# Patient Record
Sex: Female | Born: 1945 | Race: White | Hispanic: No | Marital: Married | Smoking: Former smoker
Health system: Southern US, Community
[De-identification: ages and names within clinical notes are randomized; demographics above are authoritative.]

## PROBLEM LIST (undated history)

## (undated) DIAGNOSIS — M1990 Unspecified osteoarthritis, unspecified site: Secondary | ICD-10-CM

## (undated) DIAGNOSIS — I10 Essential (primary) hypertension: Secondary | ICD-10-CM

## (undated) DIAGNOSIS — C4490 Unspecified malignant neoplasm of skin, unspecified: Secondary | ICD-10-CM

## (undated) DIAGNOSIS — J45909 Unspecified asthma, uncomplicated: Secondary | ICD-10-CM

## (undated) DIAGNOSIS — R51 Headache: Secondary | ICD-10-CM

## (undated) DIAGNOSIS — J069 Acute upper respiratory infection, unspecified: Secondary | ICD-10-CM

## (undated) DIAGNOSIS — J019 Acute sinusitis, unspecified: Secondary | ICD-10-CM

## (undated) DIAGNOSIS — G4700 Insomnia, unspecified: Secondary | ICD-10-CM

## (undated) DIAGNOSIS — J189 Pneumonia, unspecified organism: Secondary | ICD-10-CM

## (undated) DIAGNOSIS — J029 Acute pharyngitis, unspecified: Secondary | ICD-10-CM

## (undated) HISTORY — PX: BREAST IMPLANT REMOVAL: SUR1101

## (undated) HISTORY — DX: Essential (primary) hypertension: I10

## (undated) HISTORY — DX: Unspecified asthma, uncomplicated: J45.909

## (undated) HISTORY — PX: REDUCTION MAMMAPLASTY: SUR839

## (undated) HISTORY — PX: AUGMENTATION MAMMAPLASTY: SUR837

## (undated) HISTORY — DX: Headache: R51

## (undated) HISTORY — DX: Pneumonia, unspecified organism: J18.9

## (undated) HISTORY — DX: Unspecified malignant neoplasm of skin, unspecified: C44.90

## (undated) HISTORY — DX: Unspecified osteoarthritis, unspecified site: M19.90

## (undated) HISTORY — PX: BREAST ENHANCEMENT SURGERY: SHX7

## (undated) HISTORY — PX: WISDOM TOOTH EXTRACTION: SHX21

---
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
## 1976-10-03 HISTORY — PX: TUBAL LIGATION: SHX77

## 1981-10-03 HISTORY — PX: NASAL SEPTUM SURGERY: SHX37

## 1993-10-03 HISTORY — PX: ABDOMINAL HYSTERECTOMY: SHX81

## 1998-02-24 ENCOUNTER — Other Ambulatory Visit: Admission: RE | Admit: 1998-02-24 | Discharge: 1998-02-24 | Payer: Self-pay | Admitting: Gynecology

## 1998-10-03 HISTORY — PX: SHOULDER SURGERY: SHX246

## 1998-10-03 HISTORY — PX: ROTATOR CUFF REPAIR: SHX139

## 1999-11-08 ENCOUNTER — Encounter: Payer: Self-pay | Admitting: Family Medicine

## 1999-11-08 ENCOUNTER — Ambulatory Visit (HOSPITAL_COMMUNITY): Admission: RE | Admit: 1999-11-08 | Discharge: 1999-11-08 | Payer: Self-pay | Admitting: Family Medicine

## 1999-11-11 ENCOUNTER — Ambulatory Visit (HOSPITAL_BASED_OUTPATIENT_CLINIC_OR_DEPARTMENT_OTHER): Admission: RE | Admit: 1999-11-11 | Discharge: 1999-11-11 | Payer: Self-pay | Admitting: *Deleted

## 2000-09-11 ENCOUNTER — Other Ambulatory Visit: Admission: RE | Admit: 2000-09-11 | Discharge: 2000-09-11 | Payer: Self-pay | Admitting: Obstetrics and Gynecology

## 2000-09-21 ENCOUNTER — Encounter: Payer: Self-pay | Admitting: Obstetrics and Gynecology

## 2000-09-21 ENCOUNTER — Ambulatory Visit (HOSPITAL_COMMUNITY): Admission: RE | Admit: 2000-09-21 | Discharge: 2000-09-21 | Payer: Self-pay | Admitting: Obstetrics and Gynecology

## 2001-09-03 ENCOUNTER — Other Ambulatory Visit: Admission: RE | Admit: 2001-09-03 | Discharge: 2001-09-03 | Payer: Self-pay | Admitting: Obstetrics and Gynecology

## 2001-09-24 ENCOUNTER — Encounter: Payer: Self-pay | Admitting: Obstetrics and Gynecology

## 2001-09-24 ENCOUNTER — Ambulatory Visit (HOSPITAL_COMMUNITY): Admission: RE | Admit: 2001-09-24 | Discharge: 2001-09-24 | Payer: Self-pay | Admitting: Obstetrics and Gynecology

## 2002-10-02 ENCOUNTER — Encounter: Payer: Self-pay | Admitting: Obstetrics and Gynecology

## 2002-10-02 ENCOUNTER — Ambulatory Visit (HOSPITAL_COMMUNITY): Admission: RE | Admit: 2002-10-02 | Discharge: 2002-10-02 | Payer: Self-pay | Admitting: Obstetrics and Gynecology

## 2002-10-24 ENCOUNTER — Other Ambulatory Visit: Admission: RE | Admit: 2002-10-24 | Discharge: 2002-10-24 | Payer: Self-pay | Admitting: Obstetrics and Gynecology

## 2003-10-06 ENCOUNTER — Ambulatory Visit (HOSPITAL_COMMUNITY): Admission: RE | Admit: 2003-10-06 | Discharge: 2003-10-06 | Payer: Self-pay | Admitting: Obstetrics and Gynecology

## 2003-11-17 ENCOUNTER — Other Ambulatory Visit: Admission: RE | Admit: 2003-11-17 | Discharge: 2003-11-17 | Payer: Self-pay | Admitting: Obstetrics and Gynecology

## 2004-05-10 ENCOUNTER — Emergency Department (HOSPITAL_COMMUNITY): Admission: EM | Admit: 2004-05-10 | Discharge: 2004-05-10 | Payer: Self-pay | Admitting: *Deleted

## 2004-09-22 ENCOUNTER — Ambulatory Visit: Payer: Self-pay | Admitting: Family Medicine

## 2004-10-05 ENCOUNTER — Ambulatory Visit: Payer: Self-pay | Admitting: Family Medicine

## 2004-11-09 ENCOUNTER — Ambulatory Visit: Payer: Self-pay | Admitting: Family Medicine

## 2004-12-06 ENCOUNTER — Other Ambulatory Visit: Admission: RE | Admit: 2004-12-06 | Discharge: 2004-12-06 | Payer: Self-pay | Admitting: Obstetrics and Gynecology

## 2004-12-21 ENCOUNTER — Ambulatory Visit: Payer: Self-pay | Admitting: Family Medicine

## 2005-01-07 ENCOUNTER — Ambulatory Visit: Payer: Self-pay | Admitting: Family Medicine

## 2005-07-21 ENCOUNTER — Ambulatory Visit: Payer: Self-pay | Admitting: Family Medicine

## 2005-08-23 ENCOUNTER — Ambulatory Visit: Payer: Self-pay | Admitting: Family Medicine

## 2005-09-06 ENCOUNTER — Ambulatory Visit: Payer: Self-pay | Admitting: Family Medicine

## 2005-10-03 HISTORY — PX: COLONOSCOPY: SHX174

## 2005-10-25 ENCOUNTER — Ambulatory Visit: Payer: Self-pay | Admitting: Family Medicine

## 2005-11-14 ENCOUNTER — Ambulatory Visit: Payer: Self-pay | Admitting: Family Medicine

## 2005-12-14 ENCOUNTER — Ambulatory Visit: Payer: Self-pay | Admitting: Family Medicine

## 2006-01-10 ENCOUNTER — Other Ambulatory Visit: Admission: RE | Admit: 2006-01-10 | Discharge: 2006-01-10 | Payer: Self-pay | Admitting: Obstetrics and Gynecology

## 2006-01-30 ENCOUNTER — Ambulatory Visit: Payer: Self-pay | Admitting: Family Medicine

## 2006-03-01 ENCOUNTER — Encounter: Admission: RE | Admit: 2006-03-01 | Discharge: 2006-03-01 | Payer: Self-pay | Admitting: Family Medicine

## 2006-03-01 ENCOUNTER — Ambulatory Visit: Payer: Self-pay | Admitting: Gastroenterology

## 2006-03-15 ENCOUNTER — Ambulatory Visit: Payer: Self-pay | Admitting: Gastroenterology

## 2006-07-31 ENCOUNTER — Ambulatory Visit: Payer: Self-pay | Admitting: Family Medicine

## 2006-08-08 ENCOUNTER — Ambulatory Visit: Payer: Self-pay | Admitting: Family Medicine

## 2006-08-14 ENCOUNTER — Ambulatory Visit: Payer: Self-pay | Admitting: Family Medicine

## 2006-08-14 LAB — CONVERTED CEMR LAB
CO2: 32 meq/L (ref 19–32)
Chloride: 105 meq/L (ref 96–112)
Creatinine, Ser: 0.9 mg/dL (ref 0.4–1.2)
Sodium: 141 meq/L (ref 135–145)

## 2006-11-14 ENCOUNTER — Ambulatory Visit: Payer: Self-pay | Admitting: Family Medicine

## 2006-12-13 ENCOUNTER — Ambulatory Visit: Payer: Self-pay | Admitting: Family Medicine

## 2006-12-13 LAB — CONVERTED CEMR LAB
BUN: 10 mg/dL (ref 6–23)
CO2: 32 meq/L (ref 19–32)
Calcium: 8.9 mg/dL (ref 8.4–10.5)
Creatinine, Ser: 0.7 mg/dL (ref 0.4–1.2)
Glucose, Bld: 62 mg/dL — ABNORMAL LOW (ref 70–99)
Potassium: 4.4 meq/L (ref 3.5–5.1)

## 2007-01-19 ENCOUNTER — Ambulatory Visit: Payer: Self-pay | Admitting: Internal Medicine

## 2007-02-07 ENCOUNTER — Encounter: Payer: Self-pay | Admitting: Family Medicine

## 2007-02-07 ENCOUNTER — Ambulatory Visit: Payer: Self-pay | Admitting: Family Medicine

## 2007-02-07 DIAGNOSIS — J069 Acute upper respiratory infection, unspecified: Secondary | ICD-10-CM

## 2007-02-07 DIAGNOSIS — I10 Essential (primary) hypertension: Secondary | ICD-10-CM | POA: Insufficient documentation

## 2007-02-07 DIAGNOSIS — R51 Headache: Secondary | ICD-10-CM | POA: Insufficient documentation

## 2007-02-07 DIAGNOSIS — R519 Headache, unspecified: Secondary | ICD-10-CM | POA: Insufficient documentation

## 2007-02-07 HISTORY — DX: Essential (primary) hypertension: I10

## 2007-02-07 HISTORY — DX: Acute upper respiratory infection, unspecified: J06.9

## 2007-02-14 LAB — CONVERTED CEMR LAB: Pap Smear: NORMAL

## 2007-03-15 ENCOUNTER — Ambulatory Visit: Payer: Self-pay | Admitting: Family Medicine

## 2007-05-23 ENCOUNTER — Encounter: Payer: Self-pay | Admitting: Family Medicine

## 2007-09-11 ENCOUNTER — Ambulatory Visit: Payer: Self-pay | Admitting: Family Medicine

## 2008-01-08 ENCOUNTER — Ambulatory Visit: Payer: Self-pay | Admitting: Family Medicine

## 2008-01-08 ENCOUNTER — Telehealth (INDEPENDENT_AMBULATORY_CARE_PROVIDER_SITE_OTHER): Payer: Self-pay | Admitting: *Deleted

## 2008-01-08 DIAGNOSIS — Z85828 Personal history of other malignant neoplasm of skin: Secondary | ICD-10-CM

## 2008-01-08 HISTORY — DX: Personal history of other malignant neoplasm of skin: Z85.828

## 2008-01-08 LAB — CONVERTED CEMR LAB
Bilirubin Urine: NEGATIVE
Ketones, urine, test strip: NEGATIVE
Protein, U semiquant: NEGATIVE
Urobilinogen, UA: NEGATIVE

## 2008-01-14 LAB — CONVERTED CEMR LAB
AST: 27 units/L (ref 0–37)
Alkaline Phosphatase: 55 units/L (ref 39–117)
Basophils Absolute: 0 10*3/uL (ref 0.0–0.1)
Chloride: 105 meq/L (ref 96–112)
Cholesterol: 198 mg/dL (ref 0–200)
Creatinine, Ser: 0.8 mg/dL (ref 0.4–1.2)
Eosinophils Absolute: 0.1 10*3/uL (ref 0.0–0.7)
GFR calc non Af Amer: 78 mL/min
HDL: 75.8 mg/dL (ref >39.0)
MCHC: 32.1 g/dL (ref 30.0–36.0)
MCV: 97 fL (ref 78.0–100.0)
Neutrophils Relative %: 56.9 % (ref 43.0–77.0)
Platelets: 210 10*3/uL (ref 150–400)
Potassium: 4.9 meq/L (ref 3.5–5.1)
RDW: 12.7 % (ref 11.5–14.6)
Sodium: 142 meq/L (ref 135–145)
TSH: 0.78 microintl units/mL (ref 0.35–5.50)
Total Bilirubin: 0.9 mg/dL (ref 0.3–1.2)
VLDL: 14 mg/dL (ref 0–40)

## 2008-02-11 ENCOUNTER — Ambulatory Visit: Payer: Self-pay | Admitting: Family Medicine

## 2008-02-11 ENCOUNTER — Encounter (INDEPENDENT_AMBULATORY_CARE_PROVIDER_SITE_OTHER): Payer: Self-pay | Admitting: *Deleted

## 2008-02-11 LAB — CONVERTED CEMR LAB: OCCULT 3: NEGATIVE

## 2008-02-15 ENCOUNTER — Encounter: Payer: Self-pay | Admitting: Family Medicine

## 2008-03-10 ENCOUNTER — Encounter (INDEPENDENT_AMBULATORY_CARE_PROVIDER_SITE_OTHER): Payer: Self-pay | Admitting: *Deleted

## 2008-05-01 ENCOUNTER — Telehealth: Payer: Self-pay | Admitting: Internal Medicine

## 2008-07-15 ENCOUNTER — Ambulatory Visit: Payer: Self-pay | Admitting: Family Medicine

## 2008-07-15 ENCOUNTER — Encounter (INDEPENDENT_AMBULATORY_CARE_PROVIDER_SITE_OTHER): Payer: Self-pay | Admitting: *Deleted

## 2008-07-15 LAB — CONVERTED CEMR LAB
ALT: 11 units/L (ref 0–35)
Alkaline Phosphatase: 53 units/L (ref 39–117)
Bilirubin, Direct: 0.1 mg/dL (ref 0.0–0.3)
Cholesterol: 182 mg/dL (ref 0–200)
Total Protein: 6.6 g/dL (ref 6.0–8.3)
VLDL: 19 mg/dL (ref 0–40)

## 2009-02-26 ENCOUNTER — Ambulatory Visit: Payer: Self-pay | Admitting: Family Medicine

## 2009-02-26 DIAGNOSIS — J029 Acute pharyngitis, unspecified: Secondary | ICD-10-CM

## 2009-02-26 HISTORY — DX: Acute pharyngitis, unspecified: J02.9

## 2009-03-03 ENCOUNTER — Telehealth (INDEPENDENT_AMBULATORY_CARE_PROVIDER_SITE_OTHER): Payer: Self-pay | Admitting: *Deleted

## 2009-03-17 ENCOUNTER — Ambulatory Visit: Payer: Self-pay | Admitting: Family Medicine

## 2009-03-17 DIAGNOSIS — J019 Acute sinusitis, unspecified: Secondary | ICD-10-CM | POA: Insufficient documentation

## 2009-03-17 HISTORY — DX: Acute sinusitis, unspecified: J01.90

## 2009-05-26 ENCOUNTER — Ambulatory Visit: Payer: Self-pay | Admitting: Family Medicine

## 2009-05-26 LAB — CONVERTED CEMR LAB: Rapid Strep: NEGATIVE

## 2009-05-27 ENCOUNTER — Encounter: Payer: Self-pay | Admitting: Family Medicine

## 2009-06-02 ENCOUNTER — Encounter (INDEPENDENT_AMBULATORY_CARE_PROVIDER_SITE_OTHER): Payer: Self-pay | Admitting: *Deleted

## 2009-07-30 ENCOUNTER — Ambulatory Visit: Payer: Self-pay | Admitting: Family Medicine

## 2009-08-13 ENCOUNTER — Ambulatory Visit: Payer: Self-pay | Admitting: Family Medicine

## 2009-12-22 ENCOUNTER — Ambulatory Visit: Payer: Self-pay | Admitting: Family Medicine

## 2010-10-10 ENCOUNTER — Encounter
Admission: RE | Admit: 2010-10-10 | Discharge: 2010-10-10 | Payer: Self-pay | Source: Home / Self Care | Attending: Sports Medicine | Admitting: Sports Medicine

## 2010-10-14 ENCOUNTER — Ambulatory Visit
Admission: RE | Admit: 2010-10-14 | Discharge: 2010-10-14 | Payer: Self-pay | Source: Home / Self Care | Attending: Family Medicine | Admitting: Family Medicine

## 2010-10-14 ENCOUNTER — Other Ambulatory Visit: Payer: Self-pay | Admitting: Family Medicine

## 2010-10-14 LAB — BASIC METABOLIC PANEL
BUN: 13 mg/dL (ref 6–23)
CO2: 32 mEq/L (ref 19–32)
Calcium: 9.6 mg/dL (ref 8.4–10.5)
Chloride: 100 mEq/L (ref 96–112)
Creatinine, Ser: 0.7 mg/dL (ref 0.4–1.2)
GFR: 87.21 mL/min (ref >60.00)
Glucose, Bld: 66 mg/dL — ABNORMAL LOW (ref 70–99)
Potassium: 4.3 mEq/L (ref 3.5–5.1)
Sodium: 138 mEq/L (ref 135–145)

## 2010-11-03 ENCOUNTER — Encounter: Payer: Self-pay | Admitting: Family Medicine

## 2010-11-04 NOTE — Assessment & Plan Note (Signed)
Summary: sore throat/cough/kdc   Vital Signs:  Patient profile:   65 year old female Height:      64 inches Weight:      146 pounds BMI:     25.15 Temp:     98.2 degrees F oral Pulse rate:   72 / minute Pulse rhythm:   regular BP sitting:   120 / 80  (left arm) Cuff size:   regular  Vitals Entered By: Army Fossa CMA (December 22, 2009 2:19 PM) CC: Pt here for c/o sore throat and terrible cough since friday night. No fever. Took ASA, and gargled with salt water, URI symptoms   History of Present Illness:       This is a 65 year old woman who presents with URI symptoms since Friday.  The patient complains of sore throat, productive cough, and earache, but denies nasal congestion, clear nasal discharge, purulent nasal discharge, dry cough, and sick contacts.  The patient denies fever, low-grade fever (<100.5 degrees), fever of 100.5-103 degrees, fever of 103.1-104 degrees, fever to >104 degrees, stiff neck, dyspnea, wheezing, rash, vomiting, diarrhea, use of an antipyretic, and response to antipyretic.  The patient denies itchy watery eyes, itchy throat, sneezing, seasonal symptoms, response to antihistamine, headache, muscle aches, and severe fatigue.  The patient denies the following risk factors for Strep sinusitis: unilateral facial pain, unilateral nasal discharge, poor response to decongestant, double sickening, tooth pain, Strep exposure, tender adenopathy, and absence of cough.    Current Medications (verified): 1)  Imitrex  Tabs (Sumatriptan Succinate Tabs) .... As Needed 2)  Diovan Hct 80-12.5 Mg Tabs (Valsartan-Hydrochlorothiazide) .Marland Kitchen.. 1 By Mouth Once Daily 3)  Vivelle-Dot 0.05 Mg/24hr  Pttw (Estradiol) .... Once Every Week 4)  One-A-Day Extras Antioxidant  Caps (Multiple Vitamins-Minerals) .... Daily 5)  Nasacort Aq 55 Mcg/act Aers (Triamcinolone Acetonide(Nasal)) .... 2 Sprays Each Nostril Once Daily 6)  Xyzal 5 Mg Tabs (Levocetirizine Dihydrochloride) .Marland Kitchen.. 1 By Mouth Once  Daily 7)  Zithromax Z-Pak 250 Mg Tabs (Azithromycin) .... As Directed 8)  Cheratussin Ac 100-10 Mg/25ml Syrp (Guaifenesin-Codeine) .Marland Kitchen.. 1-2 Tsp By Mouth At Bedtime As Needed  Allergies (verified): No Known Drug Allergies  Past History:  Past medical, surgical, family and social histories (including risk factors) reviewed for relevance to current acute and chronic problems.  Past Medical History: Reviewed history from 02/07/2007 and no changes required. Skin cancer, hx of, basal cell Headache Hypertension  Past Surgical History: Reviewed history from 01/08/2008 and no changes required. Hysterectomy-1995 2000-shoulder surgery 1983-deviated septum 2007-colon negative  Family History: Reviewed history from 01/08/2008 and no changes required. Family History Diabetes 1st degree relative Family History Hypertension Family History of Skin cancer---melanoma B  Social History: Reviewed history from 01/08/2008 and no changes required. Former Smoker-quit 2002 married 2 children Retired---postal service Alcohol use-no Drug use-no Regular exercise-yes  Review of Systems      See HPI  Physical Exam  General:  Well-developed,well-nourished,in no acute distress; alert,appropriate and cooperative throughout examination Ears:  + fluid b/l Nose:  External nasal examination shows no deformity or inflammation. Nasal mucosa are pink and moist without lesions or exudates. Mouth:  pharyngeal erythema and postnasal drip.   Neck:  No deformities, masses, or tenderness noted. Lungs:  R decreased breath sounds and L decreased breath sounds.  improved after neb Heart:  normal rate and no murmur.   Psych:  Cognition and judgment appear intact. Alert and cooperative with normal attention span and concentration. No apparent delusions, illusions, hallucinations  Impression & Recommendations:  Problem # 1:  BRONCHITIS- ACUTE (ICD-466.0)  Take antibiotics and other medications as directed.  Encouraged to push clear liquids, get enough rest, and take acetaminophen as needed. To be seen in 5-7 days if no improvement, sooner if worse.  Her updated medication list for this problem includes:    Zithromax Z-pak 250 Mg Tabs (Azithromycin) .Marland Kitchen... As directed    Cheratussin Ac 100-10 Mg/52ml Syrp (Guaifenesin-codeine) .Marland Kitchen... 1-2 tsp by mouth at bedtime as needed  Orders: Nebulizer Tx (53664)  Complete Medication List: 1)  Imitrex Tabs (Sumatriptan succinate tabs) .... As needed 2)  Diovan Hct 80-12.5 Mg Tabs (Valsartan-hydrochlorothiazide) .Marland Kitchen.. 1 by mouth once daily 3)  Vivelle-dot 0.05 Mg/24hr Pttw (Estradiol) .... Once every week 4)  One-a-day Extras Antioxidant Caps (Multiple vitamins-minerals) .... Daily 5)  Nasacort Aq 55 Mcg/act Aers (Triamcinolone acetonide(nasal)) .... 2 sprays each nostril once daily 6)  Xyzal 5 Mg Tabs (Levocetirizine dihydrochloride) .Marland Kitchen.. 1 by mouth once daily 7)  Zithromax Z-pak 250 Mg Tabs (Azithromycin) .... As directed 8)  Cheratussin Ac 100-10 Mg/68ml Syrp (Guaifenesin-codeine) .Marland Kitchen.. 1-2 tsp by mouth at bedtime as needed Prescriptions: CHERATUSSIN AC 100-10 MG/5ML SYRP (GUAIFENESIN-CODEINE) 1-2 tsp by mouth at bedtime as needed  #6 oz x 0   Entered and Authorized by:   Loreen Freud DO   Signed by:   Loreen Freud DO on 12/22/2009   Method used:   Print then Give to Patient   RxID:   [id number] ZITHROMAX Z-PAK 250 MG TABS (AZITHROMYCIN) as directed  #1 x 0   Entered and Authorized by:   Loreen Freud DO   Signed by:   Loreen Freud DO on 12/22/2009   Method used:   Electronically to        [facility] [phone]* (retail)       [address]       [place], [place]  [address]       Ph: [phone]       Fax: [phone]   RxID:   [id number]    Medication Administration  Medication # 1:    Medication: Albuterol Sulfate Sol 1mg  unit dose  Orders Added: 1)  Est. Patient Level IV [83151] 2)  Nebulizer Tx  [76160]

## 2010-11-04 NOTE — Assessment & Plan Note (Signed)
Summary: DISCUSS DIOVAN/RH.........Marland Kitchen   Vital Signs:  Patient profile:   65 year old female Height:      64 inches Weight:      145.4 pounds BMI:     25.05 Pulse rate:   72 / minute Pulse rhythm:   regular BP sitting:   128 / 76  (left arm) Cuff size:   regular  Vitals Entered By: Almeta Monas CMA Duncan Dull) (October 14, 2010 10:35 AM) CC: wants to discuss Diovan   History of Present Illness:  Hypertension follow-up      This is a 65 year old woman who presents for Hypertension follow-up.  Pt is here to discuss diovan.  The price keeps going up.  Pt states her bp is high in neuro office but its normal / low at home and here.  The patient denies lightheadedness, urinary frequency, headaches, edema, impotence, rash, and fatigue.  The patient denies the following associated symptoms: chest pain, chest pressure, exercise intolerance, dyspnea, palpitations, syncope, leg edema, and pedal edema.  Compliance with medications (by patient report) has been near 100%.  The patient reports that dietary compliance has been good.  The patient reports no exercise.  Adjunctive measures currently used by the patient include salt restriction.    Current Medications (verified): 1)  Imitrex  Tabs (Sumatriptan Succinate Tabs) .... As Needed 2)  Diovan Hct 80-12.5 Mg Tabs (Valsartan-Hydrochlorothiazide) .Marland Kitchen.. 1 By Mouth Once Daily 3)  Vivelle-Dot 0.05 Mg/24hr  Pttw (Estradiol) .... Once Every Week 4)  One-A-Day Extras Antioxidant  Caps (Multiple Vitamins-Minerals) .... Daily  Allergies (verified): No Known Drug Allergies  Physical Exam  General:  Well-developed,well-nourished,in no acute distress; alert,appropriate and cooperative throughout examination Lungs:  Normal respiratory effort, chest expands symmetrically. Lungs are clear to auscultation, no crackles or wheezes. Heart:  normal rate and no murmur.   Extremities:  No clubbing, cyanosis, edema, or deformity noted with normal full range of motion of  all joints.     Impression & Recommendations:  Problem # 1:  HYPERTENSION (ICD-401.9)  Her updated medication list for this problem includes:    Diovan Hct 80-12.5 Mg Tabs (Valsartan-hydrochlorothiazide) .Marland Kitchen... 1 by mouth once daily  Orders: TLB-BMP (Basic Metabolic Panel-BMET) (80048-METABOL) Venipuncture (04540)  BP today: 128/76 Prior BP: 120/80 (12/22/2009)  Labs Reviewed: K+: 4.9 (01/08/2008) Creat: : 0.8 (01/08/2008)   Chol: 182 (07/15/2008)   HDL: 72.0 (07/15/2008)   LDL: 91 (07/15/2008)   TG: 97 (07/15/2008)  Complete Medication List: 1)  Imitrex Tabs (Sumatriptan succinate tabs) .... As needed 2)  Diovan Hct 80-12.5 Mg Tabs (Valsartan-hydrochlorothiazide) .Marland Kitchen.. 1 by mouth once daily 3)  Vivelle-dot 0.05 Mg/24hr Pttw (Estradiol) .... Once every week 4)  One-a-day Extras Antioxidant Caps (Multiple vitamins-minerals) .... Daily  Patient Instructions: 1)  schedule physical Prescriptions: DIOVAN HCT 80-12.5 MG TABS (VALSARTAN-HYDROCHLOROTHIAZIDE) 1 by mouth once daily  #90 Tablet x 3   Entered and Authorized by:   Loreen Freud DO   Signed by:   Loreen Freud DO on 10/14/2010   Method used:   Print then Give to Patient   RxID:   [id number]    Orders Added: 1)  TLB-BMP (Basic Metabolic Panel-BMET) [80048-METABOL] 2)  Venipuncture [08657] 3)  Est. Patient Level III [84696]  Appended Document: DISCUSS DIOVAN/RH.........Marland Kitchen

## 2010-11-24 NOTE — Letter (Signed)
Summary: [facility]   Imported By: Maryln Gottron 11/15/2010 13:03:51  _____________________________________________________________________  External Attachment:    Type:   Image     Comment:   External Document

## 2010-12-02 ENCOUNTER — Encounter: Payer: Self-pay | Admitting: Family Medicine

## 2010-12-08 ENCOUNTER — Encounter: Payer: Self-pay | Admitting: Family Medicine

## 2010-12-21 NOTE — Op Note (Signed)
Summary: Cervical Epidural Steroid Injection/[facility]  Cervical Epidural Steroid Injection/[facility]   Imported By: Maryln Gottron 12/15/2010 13:26:32  _____________________________________________________________________  External Attachment:    Type:   Image     Comment:   External Document

## 2010-12-21 NOTE — Letter (Signed)
Summary: [facility]   Imported By: Maryln Gottron 12/10/2010 11:09:28  _____________________________________________________________________  External Attachment:    Type:   Image     Comment:   External Document

## 2011-01-28 ENCOUNTER — Telehealth: Payer: Self-pay | Admitting: Family Medicine

## 2011-01-28 MED ORDER — VALSARTAN-HYDROCHLOROTHIAZIDE 80-12.5 MG PO TABS: 1.0000 | ORAL_TABLET | Freq: Every day | ORAL | Status: DC

## 2011-01-28 NOTE — Telephone Encounter (Signed)
Spoke with patient and confirmed Rx--left a 4 dollar coupon card at check In for patient and I called pharmacy and changed Rx to a 30 day supply because she will not be able to get a 90 day supply for 4 dollars      KP

## 2011-01-28 NOTE — Telephone Encounter (Signed)
Left message for a call back      KP

## 2011-01-28 NOTE — Telephone Encounter (Signed)
Patient called to report that her blood pressure medicine --Diovan HCT---did not "go generic" before her refill---she got a coupon for her last refill-- she needs another 90 day refill ---1) can she get another coupon or 2) can Dr Laury Axon prescribe another medication until the Diovan goes generic??   She needs answer today--she only has three pills left

## 2011-03-02 ENCOUNTER — Encounter: Payer: Self-pay | Admitting: Family Medicine

## 2011-03-04 ENCOUNTER — Encounter: Payer: Self-pay | Admitting: Family Medicine

## 2011-03-07 ENCOUNTER — Encounter: Payer: Self-pay | Admitting: Family Medicine

## 2011-03-07 ENCOUNTER — Ambulatory Visit (INDEPENDENT_AMBULATORY_CARE_PROVIDER_SITE_OTHER): Payer: BC Managed Care – PPO | Admitting: Family Medicine

## 2011-03-07 VITALS — BP 122/80 | HR 61 | Temp 97.2°F | Ht 64.5 in | Wt 145.2 lb

## 2011-03-07 DIAGNOSIS — Z0000 Encounter for general adult medical examination without abnormal findings: Secondary | ICD-10-CM

## 2011-03-07 LAB — BASIC METABOLIC PANEL
CO2: 29 mEq/L (ref 19–32)
Glucose, Bld: 86 mg/dL (ref 70–99)
Potassium: 4 mEq/L (ref 3.5–5.1)
Sodium: 140 mEq/L (ref 135–145)

## 2011-03-07 LAB — POCT URINALYSIS DIPSTICK
Blood, UA: NEGATIVE
Glucose, UA: NEGATIVE
Nitrite, UA: NEGATIVE
Urobilinogen, UA: 0.2

## 2011-03-07 LAB — CBC WITH DIFFERENTIAL/PLATELET
Basophils Absolute: 0 10*3/uL (ref 0.0–0.1)
Eosinophils Absolute: 0.2 10*3/uL (ref 0.0–0.7)
HCT: 40 % (ref 36.0–46.0)
Hemoglobin: 13.5 g/dL (ref 12.0–15.0)
Lymphs Abs: 1.4 10*3/uL (ref 0.7–4.0)
MCHC: 33.7 g/dL (ref 30.0–36.0)
MCV: 95.9 fl (ref 78.0–100.0)
Monocytes Absolute: 0.4 10*3/uL (ref 0.1–1.0)
Neutro Abs: 3 10*3/uL (ref 1.4–7.7)
RDW: 13.8 % (ref 11.5–14.6)

## 2011-03-07 LAB — HEPATIC FUNCTION PANEL
AST: 21 U/L (ref 0–37)
Albumin: 4 g/dL (ref 3.5–5.2)

## 2011-03-07 LAB — TSH: TSH: 1.21 u[IU]/mL (ref 0.35–5.50)

## 2011-03-07 NOTE — Progress Notes (Signed)
  Subjective:     Laura Hopkins is a 65 y.o. female and is here for a comprehensive physical exam. The patient reports no problems.  History   Social History  . Marital Status: Married    Spouse Name: N/A    Number of Children: 2  . Years of Education: N/A   Occupational History  . postal service     retired   Social History Main Topics  . Smoking status: Former Smoker -- 0.3 packs/day    Quit date: 03/07/1999  . Smokeless tobacco: Former Neurosurgeon    Quit date: 10/03/2000  . Alcohol Use: No  . Drug Use: No  . Sexually Active: Yes -- Female partner(s)   Other Topics Concern  . Not on file   Social History Narrative  . No narrative on file   Health Maintenance  Topic Date Due  . Influenza Vaccine  07/04/2011  . Mammogram  03/06/2012  . Pap Smear  03/06/2013  . Colonoscopy  03/15/2016  . Tetanus/tdap  03/07/2019  . Zostavax  Completed    The following portions of the patient's history were reviewed and updated as appropriate: allergies, current medications, past family history, past medical history, past social history, past surgical history and problem list.  Review of Systems Review of Systems  Constitutional: Negative for activity change, appetite change and fatigue.  HENT: Negative for hearing loss, congestion, tinnitus and ear discharge.  dentist q78m Eyes: Negative for visual disturbance (see optho q1y -- vision corrected to 20/20 with glasses).  Respiratory: Negative for cough, chest tightness and shortness of breath.   Cardiovascular: Negative for chest pain, palpitations and leg swelling.  Gastrointestinal: Negative for abdominal pain, diarrhea, constipation and abdominal distention.  Genitourinary: Negative for urgency, frequency, decreased urine volume and difficulty urinating.  Musculoskeletal: Negative for back pain, arthralgias and gait problem.  Skin: Negative for color change, pallor and rash.  Neurological: Negative for dizziness, light-headedness, numbness and  headaches.  Hematological: Negative for adenopathy. Does not bruise/bleed easily.  Psychiatric/Behavioral: Negative for suicidal ideas, confusion, sleep disturbance, self-injury, dysphoric mood, decreased concentration and agitation.   Derm --Dr Amy [place] Gyn--Dr grewal    Objective:    BP 122/80  Pulse 61  Temp(Src) 97.2 F (36.2 C) (Oral)  Ht 5' 4.5" (1.638 m)  Wt 145 lb 3.2 oz (65.862 kg)  BMI 24.54 kg/m2  SpO2 97% General appearance: alert, cooperative, appears stated age and no distress Head: Normocephalic, without obvious abnormality, atraumatic Eyes: conjunctivae/corneas clear. PERRL, EOM's intact. Fundi benign. Ears: normal TM's and external ear canals both ears Nose: Nares normal. Septum midline. Mucosa normal. No drainage or sinus tenderness. Throat: lips, mucosa, and tongue normal; teeth and gums normal Neck: no adenopathy, no carotid bruit, no JVD, supple, symmetrical, trachea midline and thyroid not enlarged, symmetric, no tenderness/mass/nodules Lungs: clear to auscultation bilaterally Breasts: gyn Heart: regular rate and rhythm, S1, S2 normal, no murmur, click, rub or gallop Abdomen: soft, non-tender; bowel sounds normal; no masses,  no organomegaly Pelvic: gyn Extremities: extremities normal, atraumatic, no cyanosis or edema Pulses: 2+ and symmetric Skin: Skin color, texture, turgor normal. No rashes or lesions Lymph nodes: Cervical, supraclavicular, and axillary nodes normal. Neurologic: Grossly normal    Assessment:    Healthy female exam.   HTN Plan:    cont meds Check fasting labs ghm utd See After Visit Summary for Counseling Recommendations

## 2011-03-07 NOTE — Patient Instructions (Signed)

## 2011-07-23 ENCOUNTER — Other Ambulatory Visit: Payer: Self-pay | Admitting: Family Medicine

## 2011-11-28 ENCOUNTER — Other Ambulatory Visit: Payer: Self-pay | Admitting: Family Medicine

## 2012-07-27 ENCOUNTER — Telehealth: Payer: Self-pay | Admitting: Family Medicine

## 2012-07-27 MED ORDER — VALSARTAN-HYDROCHLOROTHIAZIDE 80-12.5 MG PO TABS: ORAL_TABLET | ORAL | Status: DC

## 2012-07-27 NOTE — Telephone Encounter (Signed)
Refill: Valsartan-hctz 80-12.5 mg tab. Take 1 tablet by mouth daily. Qty 90. Last fill 7.27.13

## 2012-08-08 ENCOUNTER — Ambulatory Visit (INDEPENDENT_AMBULATORY_CARE_PROVIDER_SITE_OTHER): Payer: BC Managed Care – PPO | Admitting: Family Medicine

## 2012-08-08 ENCOUNTER — Encounter: Payer: Self-pay | Admitting: Family Medicine

## 2012-08-08 VITALS — BP 122/78 | HR 54 | Temp 97.8°F | Wt 142.4 lb

## 2012-08-08 DIAGNOSIS — I10 Essential (primary) hypertension: Secondary | ICD-10-CM

## 2012-08-08 LAB — BASIC METABOLIC PANEL
Calcium: 9.3 mg/dL (ref 8.4–10.5)
GFR: 76.78 mL/min (ref >60.00)
Glucose, Bld: 75 mg/dL (ref 70–99)
Sodium: 138 mEq/L (ref 135–145)

## 2012-08-08 LAB — POCT URINALYSIS DIPSTICK
Ketones, UA: NEGATIVE
Protein, UA: NEGATIVE
Spec Grav, UA: <=1.005
pH, UA: 7.5

## 2012-08-08 LAB — HEPATIC FUNCTION PANEL
Albumin: 4 g/dL (ref 3.5–5.2)
Alkaline Phosphatase: 51 U/L (ref 39–117)
Total Bilirubin: 0.7 mg/dL (ref 0.3–1.2)

## 2012-08-08 LAB — LIPID PANEL
Cholesterol: 163 mg/dL (ref 0–200)
HDL: 68.1 mg/dL (ref >39.00)
VLDL: 14.2 mg/dL (ref 0.0–40.0)

## 2012-08-08 MED ORDER — VALSARTAN-HYDROCHLOROTHIAZIDE 80-12.5 MG PO TABS: ORAL_TABLET | ORAL | Status: DC

## 2012-08-08 NOTE — Patient Instructions (Signed)

## 2012-08-08 NOTE — Progress Notes (Signed)
  Subjective:    Patient here for follow-up of elevated blood pressure.  She is exercising and is adherent to a low-salt diet.  Blood pressure is well controlled at home. Cardiac symptoms: none. Patient denies: chest pain, chest pressure/discomfort, claudication, dyspnea, exertional chest pressure/discomfort, fatigue, irregular heart beat, lower extremity edema, near-syncope, orthopnea, palpitations, paroxysmal nocturnal dyspnea, syncope and tachypnea. Cardiovascular risk factors: hypertension. Use of agents associated with hypertension: none. History of target organ damage: none.  The following portions of the patient's history were reviewed and updated as appropriate: allergies, current medications, past family history, past medical history, past social history, past surgical history and problem list.  Review of Systems Pertinent items are noted in HPI.     Objective:    BP 122/78  Pulse 54  Temp 97.8 F (36.6 C) (Oral)  Wt 142 lb 6.4 oz (64.592 kg)  SpO2 97% General appearance: alert, cooperative, appears stated age and no distress Neck: no adenopathy, supple, symmetrical, trachea midline and thyroid not enlarged, symmetric, no tenderness/mass/nodules Lungs: clear to auscultation bilaterally Heart: S1, S2 normal Extremities: extremities normal, atraumatic, no cyanosis or edema    Assessment:    Hypertension, normal blood pressure . Evidence of target organ damage: none.    Plan:    Medication: no change. Dietary sodium restriction. Regular aerobic exercise. Check blood pressures 2-3 times weekly and record. Follow up: 6 months and as needed.

## 2013-08-12 ENCOUNTER — Other Ambulatory Visit: Payer: Self-pay | Admitting: Nurse Practitioner

## 2013-08-12 ENCOUNTER — Ambulatory Visit (INDEPENDENT_AMBULATORY_CARE_PROVIDER_SITE_OTHER): Payer: BC Managed Care – PPO | Admitting: Nurse Practitioner

## 2013-08-12 ENCOUNTER — Other Ambulatory Visit: Payer: BC Managed Care – PPO

## 2013-08-12 VITALS — BP 138/82 | HR 65 | Temp 98.9°F | Wt 148.0 lb

## 2013-08-12 DIAGNOSIS — Z23 Encounter for immunization: Secondary | ICD-10-CM

## 2013-08-12 DIAGNOSIS — J029 Acute pharyngitis, unspecified: Secondary | ICD-10-CM

## 2013-08-12 DIAGNOSIS — J020 Streptococcal pharyngitis: Secondary | ICD-10-CM

## 2013-08-12 LAB — HM MAMMOGRAPHY

## 2013-08-12 LAB — HM PAP SMEAR

## 2013-08-12 NOTE — Progress Notes (Signed)
  Subjective:    Patient ID: Laura Hopkins, female    DOB: 12-04-1945, 67 y.o.   MRN: [id number]  Sore Throat  This is a new problem. The current episode started yesterday. The problem has been gradually worsening. Neither side of throat is experiencing more pain than the other. There has been no fever. The pain is at a severity of 7/10. Associated symptoms include swollen glands. Pertinent negatives include no abdominal pain, congestion, coughing, diarrhea, ear pain, headaches, hoarse voice, plugged ear sensation, shortness of breath, trouble swallowing or vomiting. She has had no exposure to strep. Exposure to: son has URI. She has tried nothing for the symptoms.      Review of Systems  Constitutional: Negative for fever, chills, activity change, appetite change and fatigue.  HENT: Positive for sore throat. Negative for congestion, ear pain, hoarse voice and trouble swallowing.   Eyes: Negative for redness.  Respiratory: Negative for cough and shortness of breath.   Gastrointestinal: Negative for vomiting, abdominal pain and diarrhea.  Skin: Negative for rash.  Neurological: Negative for headaches.  Hematological: Positive for adenopathy.       Objective:   Physical Exam  Vitals reviewed. Constitutional: She is oriented to person, place, and time. She appears well-developed and well-nourished. No distress.  HENT:  Head: Normocephalic and atraumatic.  Right Ear: External ear normal.  Left Ear: External ear normal.  Mouth/Throat: Posterior oropharyngeal erythema present. No oropharyngeal exudate.  Eyes: Conjunctivae are normal. Right eye exhibits no discharge. Left eye exhibits no discharge.  Neck: No tracheal deviation present. No thyromegaly present.  Cardiovascular: Normal rate, regular rhythm and normal heart sounds.   No murmur heard. Pulmonary/Chest: Effort normal and breath sounds normal. No respiratory distress. She has no wheezes.  Lymphadenopathy:    She has no cervical  adenopathy.  Neurological: She is alert and oriented to person, place, and time.  Skin: Skin is warm and dry.  Psychiatric: She has a normal mood and affect. Her behavior is normal. Thought content normal.          Assessment & Plan:  1. Sore throat 1 day, no other symptoms poc strep neg upper Respiratory Culture pending See pt instructions  2. Need for prophylactic vaccination and inoculation against influenza  - Flu Vaccine QUAD 36+ mos IM

## 2013-08-12 NOTE — Patient Instructions (Signed)
Our office will call you regarding your strep culture. In the meantime, use benzocaine throat lozenges for comfort. Sinus rinses if you develop nasal drainage. If this is viral, you will shed virus for up to 5 days. Feel better!

## 2013-08-14 ENCOUNTER — Telehealth: Payer: Self-pay | Admitting: Nurse Practitioner

## 2013-08-14 NOTE — Telephone Encounter (Signed)
Neg throat culture. Sore throat likely viral.

## 2013-08-14 NOTE — Telephone Encounter (Signed)
Patient notified of results.

## 2013-08-15 LAB — CULTURE, UPPER RESPIRATORY: Organism ID, Bacteria: NORMAL

## 2013-08-16 ENCOUNTER — Other Ambulatory Visit: Payer: Self-pay | Admitting: Family Medicine

## 2013-08-26 ENCOUNTER — Encounter: Payer: Self-pay | Admitting: Family Medicine

## 2013-08-26 ENCOUNTER — Ambulatory Visit (INDEPENDENT_AMBULATORY_CARE_PROVIDER_SITE_OTHER): Payer: BC Managed Care – PPO | Admitting: Family Medicine

## 2013-08-26 VITALS — BP 120/74 | HR 61 | Resp 16 | Wt 147.8 lb

## 2013-08-26 DIAGNOSIS — I10 Essential (primary) hypertension: Secondary | ICD-10-CM

## 2013-08-26 LAB — CBC WITH DIFFERENTIAL/PLATELET
Basophils Absolute: 0 10*3/uL (ref 0.0–0.1)
Eosinophils Absolute: 0.1 10*3/uL (ref 0.0–0.7)
HCT: 43.1 % (ref 36.0–46.0)
Hemoglobin: 14.2 g/dL (ref 12.0–15.0)
Lymphs Abs: 1.4 10*3/uL (ref 0.7–4.0)
MCV: 94.6 fl (ref 78.0–100.0)
Monocytes Absolute: 0.6 10*3/uL (ref 0.1–1.0)
Monocytes Relative: 5.4 % (ref 3.0–12.0)
Platelets: 258 10*3/uL (ref 150.0–400.0)
RDW: 13.5 % (ref 11.5–14.6)
WBC: 10.6 10*3/uL — ABNORMAL HIGH (ref 4.5–10.5)

## 2013-08-26 LAB — LIPID PANEL
Cholesterol: 176 mg/dL (ref 0–200)
HDL: 66 mg/dL (ref >39.00)
LDL Cholesterol: 96 mg/dL (ref 0–99)
Triglycerides: 70 mg/dL (ref 0.0–149.0)
VLDL: 14 mg/dL (ref 0.0–40.0)

## 2013-08-26 LAB — BASIC METABOLIC PANEL
BUN: 15 mg/dL (ref 6–23)
GFR: 95.55 mL/min (ref >60.00)
Glucose, Bld: 72 mg/dL (ref 70–99)
Potassium: 4.1 mEq/L (ref 3.5–5.1)

## 2013-08-26 LAB — POCT URINALYSIS DIPSTICK
Blood, UA: NEGATIVE
Nitrite, UA: NEGATIVE
Protein, UA: NEGATIVE
Spec Grav, UA: <=1.005
Urobilinogen, UA: 0.2
pH, UA: 6

## 2013-08-26 LAB — HEPATIC FUNCTION PANEL: Total Bilirubin: 0.4 mg/dL (ref 0.3–1.2)

## 2013-08-26 MED ORDER — VALSARTAN-HYDROCHLOROTHIAZIDE 80-12.5 MG PO TABS: 1.0000 | ORAL_TABLET | Freq: Every day | ORAL | Status: DC

## 2013-08-26 NOTE — Patient Instructions (Signed)

## 2013-08-26 NOTE — Progress Notes (Signed)
Pre visit review using our clinic review tool, if applicable. No additional management support is needed unless otherwise documented below in the visit note. 

## 2013-08-26 NOTE — Progress Notes (Signed)
  Subjective:    Patient here for follow-up of elevated blood pressure.  She is not exercising and is adherent to a low-salt diet.  Blood pressure is well controlled at home. Cardiac symptoms: none. Patient denies: chest pain, chest pressure/discomfort, claudication, dyspnea, exertional chest pressure/discomfort, fatigue, irregular heart beat, lower extremity edema, near-syncope, orthopnea, palpitations, paroxysmal nocturnal dyspnea, syncope and tachypnea. Cardiovascular risk factors: hypertension. Use of agents associated with hypertension: none. History of target organ damage: none.  The following portions of the patient's history were reviewed and updated as appropriate: allergies, current medications, past family history, past medical history, past social history, past surgical history and problem list.  Review of Systems Pertinent items are noted in HPI.     Objective:    BP 120/74  Pulse 61  Resp 16  Wt 147 lb 12.8 oz (67.042 kg)  SpO2 97% General appearance: alert, cooperative, appears stated age and no distress Throat: lips, mucosa, and tongue normal; teeth and gums normal Neck: no adenopathy, supple, symmetrical, trachea midline and thyroid not enlarged, symmetric, no tenderness/mass/nodules Lungs: clear to auscultation bilaterally Heart: S1, S2 normal Extremities: extremities normal, atraumatic, no cyanosis or edema    Assessment:    Hypertension, normal blood pressure . Evidence of target organ damage: none.    Plan:    Medication: no change. Dietary sodium restriction. Regular aerobic exercise. Check blood pressures 2-3 times weekly and record. Follow up: 6 months and as needed.

## 2014-03-27 ENCOUNTER — Encounter: Payer: Self-pay | Admitting: Family Medicine

## 2014-03-27 ENCOUNTER — Ambulatory Visit (INDEPENDENT_AMBULATORY_CARE_PROVIDER_SITE_OTHER): Payer: Medicare HMO | Admitting: Family Medicine

## 2014-03-27 VITALS — BP 120/72 | HR 60 | Temp 97.6°F | Ht 64.0 in | Wt 153.4 lb

## 2014-03-27 DIAGNOSIS — E785 Hyperlipidemia, unspecified: Secondary | ICD-10-CM

## 2014-03-27 DIAGNOSIS — Z23 Encounter for immunization: Secondary | ICD-10-CM

## 2014-03-27 DIAGNOSIS — I10 Essential (primary) hypertension: Secondary | ICD-10-CM

## 2014-03-27 LAB — CBC WITH DIFFERENTIAL/PLATELET
BASOS ABS: 0 10*3/uL (ref 0.0–0.1)
Basophils Relative: 0.3 % (ref 0.0–3.0)
EOS ABS: 0.1 10*3/uL (ref 0.0–0.7)
Eosinophils Relative: 1.2 % (ref 0.0–5.0)
HEMATOCRIT: 37.3 % (ref 36.0–46.0)
Hemoglobin: 12.5 g/dL (ref 12.0–15.0)
LYMPHS ABS: 2.1 10*3/uL (ref 0.7–4.0)
LYMPHS PCT: 28.3 % (ref 12.0–46.0)
MCHC: 33.6 g/dL (ref 30.0–36.0)
MCV: 92.8 fl (ref 78.0–100.0)
Monocytes Absolute: 0.5 10*3/uL (ref 0.1–1.0)
Monocytes Relative: 6.3 % (ref 3.0–12.0)
NEUTROS ABS: 4.7 10*3/uL (ref 1.4–7.7)
Neutrophils Relative %: 63.9 % (ref 43.0–77.0)
Platelets: 233 10*3/uL (ref 150.0–400.0)
RBC: 4.02 Mil/uL (ref 3.87–5.11)
RDW: 13.7 % (ref 11.5–15.5)
WBC: 7.3 10*3/uL (ref 4.0–10.5)

## 2014-03-27 LAB — LIPID PANEL
CHOLESTEROL: 172 mg/dL (ref 0–200)
HDL: 59.3 mg/dL (ref >39.00)
LDL Cholesterol: 89 mg/dL (ref 0–99)
NonHDL: 112.7
TRIGLYCERIDES: 117 mg/dL (ref 0.0–149.0)
Total CHOL/HDL Ratio: 3
VLDL: 23.4 mg/dL (ref 0.0–40.0)

## 2014-03-27 LAB — BASIC METABOLIC PANEL
BUN: 12 mg/dL (ref 6–23)
CALCIUM: 9.4 mg/dL (ref 8.4–10.5)
CO2: 30 meq/L (ref 19–32)
Chloride: 100 mEq/L (ref 96–112)
Creatinine, Ser: 0.8 mg/dL (ref 0.4–1.2)
GFR: 73.21 mL/min (ref >60.00)
GLUCOSE: 80 mg/dL (ref 70–99)
Potassium: 3.4 mEq/L — ABNORMAL LOW (ref 3.5–5.1)
Sodium: 136 mEq/L (ref 135–145)

## 2014-03-27 LAB — HEPATIC FUNCTION PANEL
ALT: 7 U/L (ref 0–35)
AST: 19 U/L (ref 0–37)
Albumin: 4.1 g/dL (ref 3.5–5.2)
Alkaline Phosphatase: 64 U/L (ref 39–117)
Bilirubin, Direct: 0 mg/dL (ref 0.0–0.3)
Total Bilirubin: 0.6 mg/dL (ref 0.2–1.2)
Total Protein: 6.9 g/dL (ref 6.0–8.3)

## 2014-03-27 MED ORDER — VALSARTAN-HYDROCHLOROTHIAZIDE 80-12.5 MG PO TABS: 1.0000 | ORAL_TABLET | Freq: Every day | ORAL | Status: DC

## 2014-03-27 NOTE — Progress Notes (Signed)
  Subjective:    Patient here for follow-up of elevated blood pressure.  She is not exercising and is adherent to a low-salt diet.  Blood pressure is well controlled at home. Cardiac symptoms: none. Patient denies: chest pain, chest pressure/discomfort, claudication, dyspnea, exertional chest pressure/discomfort, fatigue, irregular heart beat, lower extremity edema, near-syncope, orthopnea, palpitations, paroxysmal nocturnal dyspnea, syncope and tachypnea. Cardiovascular risk factors: advanced age (older than 8 for men, 13 for women), hypertension and sedentary lifestyle. Use of agents associated with hypertension: none. History of target organ damage: none.  The following portions of the patient's history were reviewed and updated as appropriate: allergies, current medications, past family history, past medical history, past social history, past surgical history and problem list.  Review of Systems Pertinent items are noted in HPI.     Objective:    BP 120/72  Pulse 60  Temp(Src) 97.6 F (36.4 C) (Oral)  Ht 5\' 4"  (1.626 m)  Wt 153 lb 6.4 oz (69.582 kg)  BMI 26.32 kg/m2  SpO2 98% Throat: lips, mucosa, and tongue normal; teeth and gums normal Neck: no adenopathy, supple, symmetrical, trachea midline and thyroid not enlarged, symmetric, no tenderness/mass/nodules Lungs: clear to auscultation bilaterally Heart: S1, S2 normal Extremities: extremities normal, atraumatic, no cyanosis or edema    Assessment:    Hypertension, normal blood pressure . Evidence of target organ damage: none.    Plan:    Medication: no change. Dietary sodium restriction. Regular aerobic exercise. Check blood pressures 2-3 times weekly and record. Follow up: 6 months and as needed.   1. Essential hypertension  - valsartan-hydrochlorothiazide (DIOVAN-HCT) 80-12.5 MG per tablet; Take 1 tablet by mouth daily.  Dispense: 90 tablet; Refill: 3 - Basic metabolic panel - CBC with Differential  2. Other and  unspecified hyperlipidemia Check labs - Hepatic function panel - Lipid panel

## 2014-03-27 NOTE — Addendum Note (Signed)
Addended by: Ewing Schlein on: 03/27/2014 02:09 PM   Modules accepted: Orders

## 2014-03-27 NOTE — Progress Notes (Signed)
Pre visit review using our clinic review tool, if applicable. No additional management support is needed unless otherwise documented below in the visit note. 

## 2014-03-27 NOTE — Patient Instructions (Signed)
Hypertension Hypertension, commonly called high blood pressure, is when the force of blood pumping through your arteries is too strong. Your arteries are the blood vessels that carry blood from your heart throughout your body. A blood pressure reading consists of a higher number over a lower number, such as 110/72. The higher number (systolic) is the pressure inside your arteries when your heart pumps. The lower number (diastolic) is the pressure inside your arteries when your heart relaxes. Ideally you want your blood pressure below 120/80. Hypertension forces your heart to work harder to pump blood. Your arteries may become narrow or stiff. Having hypertension puts you at risk for heart disease, stroke, and other problems.  RISK FACTORS Some risk factors for high blood pressure are controllable. Others are not.  Risk factors you cannot control include:   Race. You may be at higher risk if you are African American.  Age. Risk increases with age.  Gender. Men are at higher risk than women before age 45 years. After age 65, women are at higher risk than men. Risk factors you can control include:  Not getting enough exercise or physical activity.  Being overweight.  Getting too much fat, sugar, calories, or salt in your diet.  Drinking too much alcohol. SIGNS AND SYMPTOMS Hypertension does not usually cause signs or symptoms. Extremely high blood pressure (hypertensive crisis) may cause headache, anxiety, shortness of breath, and nosebleed. DIAGNOSIS  To check if you have hypertension, your health care Laura Hopkins will measure your blood pressure while you are seated, with your arm held at the level of your heart. It should be measured at least twice using the same arm. Certain conditions can cause a difference in blood pressure between your right and left arms. A blood pressure reading that is higher than normal on one occasion does not mean that you need treatment. If one blood pressure reading  is high, ask your health care Laura Hopkins about having it checked again. TREATMENT  Treating high blood pressure includes making lifestyle changes and possibly taking medication. Living a healthy lifestyle can help lower high blood pressure. You may need to change some of your habits. Lifestyle changes may include:  Following the DASH diet. This diet is high in fruits, vegetables, and whole grains. It is low in salt, red meat, and added sugars.  Getting at least 2 1/2 hours of brisk physical activity every week.  Losing weight if necessary.  Not smoking.  Limiting alcoholic beverages.  Learning ways to reduce stress. If lifestyle changes are not enough to get your blood pressure under control, your health care Laura Hopkins may prescribe medicine. You may need to take more than one. Work closely with your health care Laura Hopkins to understand the risks and benefits. HOME CARE INSTRUCTIONS  Have your blood pressure rechecked as directed by your health care Laura Hopkins.   Only take medicine as directed by your health care Laura Hopkins. Follow the directions carefully. Blood pressure medicines must be taken as prescribed. The medicine does not work as well when you skip doses. Skipping doses also puts you at risk for problems.   Do not smoke.   Monitor your blood pressure at home as directed by your health care Laura Hopkins. SEEK MEDICAL CARE IF:   You think you are having a reaction to medicines taken.  You have recurrent headaches or feel dizzy.  You have swelling in your ankles.  You have trouble with your vision. SEEK IMMEDIATE MEDICAL CARE IF:  You develop a severe headache or   confusion.  You have unusual weakness, numbness, or feel faint.  You have severe chest or abdominal pain.  You vomit repeatedly.  You have trouble breathing. MAKE SURE YOU:   Understand these instructions.  Will watch your condition.  Will get help right away if you are not doing well or get  worse. Document Released: 09/19/2005 Document Revised: 09/24/2013 Document Reviewed: 07/12/2013 Exit[employer] Patient Information 2015 [employer]. This information is not intended to replace advice given to you by your health care Laura Hopkins. Make sure you discuss any questions you have with your health care Laura Hopkins.  

## 2014-03-28 ENCOUNTER — Telehealth: Payer: Self-pay | Admitting: Family Medicine

## 2014-03-28 NOTE — Telephone Encounter (Signed)
Relevant patient education mailed to patient.  

## 2014-08-01 ENCOUNTER — Encounter: Payer: Self-pay | Admitting: Family Medicine

## 2014-10-08 ENCOUNTER — Ambulatory Visit: Payer: Medicare HMO | Admitting: Family Medicine

## 2014-10-09 ENCOUNTER — Ambulatory Visit: Payer: Medicare HMO | Admitting: Family Medicine

## 2014-10-10 ENCOUNTER — Ambulatory Visit: Payer: Medicare HMO | Admitting: Family Medicine

## 2014-10-20 ENCOUNTER — Encounter: Payer: Self-pay | Admitting: Family Medicine

## 2014-10-20 ENCOUNTER — Ambulatory Visit (INDEPENDENT_AMBULATORY_CARE_PROVIDER_SITE_OTHER): Payer: Medicare HMO | Admitting: Family Medicine

## 2014-10-20 VITALS — BP 116/78 | HR 64 | Temp 98.3°F | Wt 151.4 lb

## 2014-10-20 DIAGNOSIS — Z23 Encounter for immunization: Secondary | ICD-10-CM

## 2014-10-20 DIAGNOSIS — I10 Essential (primary) hypertension: Secondary | ICD-10-CM

## 2014-10-20 LAB — HEPATIC FUNCTION PANEL
ALBUMIN: 4 g/dL (ref 3.5–5.2)
ALT: 5 U/L (ref 0–35)
AST: 18 U/L (ref 0–37)
Alkaline Phosphatase: 66 U/L (ref 39–117)
Bilirubin, Direct: 0.1 mg/dL (ref 0.0–0.3)
Total Bilirubin: 0.6 mg/dL (ref 0.2–1.2)
Total Protein: 6.7 g/dL (ref 6.0–8.3)

## 2014-10-20 LAB — BASIC METABOLIC PANEL
BUN: 15 mg/dL (ref 6–23)
CALCIUM: 9.7 mg/dL (ref 8.4–10.5)
CO2: 30 mEq/L (ref 19–32)
Chloride: 103 mEq/L (ref 96–112)
Creatinine, Ser: 0.8 mg/dL (ref 0.40–1.20)
GFR: 76.25 mL/min (ref >60.00)
Glucose, Bld: 85 mg/dL (ref 70–99)
Potassium: 3.9 mEq/L (ref 3.5–5.1)
Sodium: 137 mEq/L (ref 135–145)

## 2014-10-20 LAB — LIPID PANEL
Cholesterol: 167 mg/dL (ref 0–200)
HDL: 61 mg/dL (ref >39.00)
LDL Cholesterol: 84 mg/dL (ref 0–99)
NonHDL: 106
TRIGLYCERIDES: 110 mg/dL (ref 0.0–149.0)
Total CHOL/HDL Ratio: 3
VLDL: 22 mg/dL (ref 0.0–40.0)

## 2014-10-20 MED ORDER — ZOSTER VACCINE LIVE 19400 UNT/0.65ML ~~LOC~~ SOLR: 0.6500 mL | Freq: Once | SUBCUTANEOUS | Status: DC

## 2014-10-20 MED ORDER — VALSARTAN-HYDROCHLOROTHIAZIDE 80-12.5 MG PO TABS: 1.0000 | ORAL_TABLET | Freq: Every day | ORAL | Status: DC

## 2014-10-20 NOTE — Progress Notes (Signed)
  Subjective:    Patient here for follow-up of elevated blood pressure.  She is not exercising and is adherent to a low-salt diet.  Blood pressure is well controlled at home. Cardiac symptoms: none. Patient denies: chest pain, chest pressure/discomfort, claudication, dyspnea, exertional chest pressure/discomfort, fatigue, irregular heart beat, lower extremity edema, near-syncope, orthopnea, palpitations, paroxysmal nocturnal dyspnea, syncope and tachypnea. Cardiovascular risk factors: hypertension and sedentary lifestyle. Use of agents associated with hypertension: none. History of target organ damage: none.  The following portions of the patient's history were reviewed and updated as appropriate:  She  has a past medical history of Skin cancer; Headache(784.0); and Hypertension. She  does not have any pertinent problems on file. She  has past surgical history that includes Abdominal hysterectomy (1995); Shoulder surgery (2000); Nasal septum surgery (1983); and Colonoscopy (2007). Her family history includes Diabetes in her father; Hypertension in her father and mother; Skin cancer in her brother. She  reports that she quit smoking about 15 years ago. She quit smokeless tobacco use about 14 years ago. She reports that she does not drink alcohol or use illicit drugs. She has a current medication list which includes the following prescription(s): estradiol, one-a-day extras antioxidant, sumatriptan, temazepam, and valsartan-hydrochlorothiazide. Current Outpatient Prescriptions on File Prior to Visit  Medication Sig Dispense Refill  . estradiol (VIVELLE-DOT) 0.05 MG/24HR Place 1 patch onto the skin once a week.      . Multiple Vitamins-Minerals (ONE-A-DAY EXTRAS ANTIOXIDANT) CAPS Take by mouth daily.      . temazepam (RESTORIL) 30 MG capsule     . valsartan-hydrochlorothiazide (DIOVAN-HCT) 80-12.5 MG per tablet Take 1 tablet by mouth daily. 90 tablet 3   No current facility-administered medications on  file prior to visit.   She has No Known Allergies..  Review of Systems Pertinent items are noted in HPI.     Objective:    BP 116/78 mmHg  Pulse 64  Temp(Src) 98.3 F (36.8 C) (Oral)  Wt 151 lb 6.4 oz (68.675 kg)  SpO2 98% General appearance: alert, cooperative, appears stated age and no distress Neck: no adenopathy, supple, symmetrical, trachea midline and thyroid not enlarged, symmetric, no tenderness/mass/nodules Lungs: clear to auscultation bilaterally Heart: S1, S2 normal Extremities: extremities normal, atraumatic, no cyanosis or edema    Assessment:    Hypertension, normal blood pressure . Evidence of target organ damage: none.    Plan:    Medication: no change. Dietary sodium restriction. Regular aerobic exercise. Follow up: 6 months and as needed.

## 2014-10-20 NOTE — Progress Notes (Signed)
Pre visit review using our clinic review tool, if applicable. No additional management support is needed unless otherwise documented below in the visit note. 

## 2014-10-20 NOTE — Patient Instructions (Signed)

## 2015-07-30 ENCOUNTER — Telehealth: Payer: Self-pay

## 2015-07-30 NOTE — Telephone Encounter (Signed)
Called to schedule Medicare Wellness Visit with Health Coach.  Left a message for call back.  

## 2015-07-31 NOTE — Telephone Encounter (Signed)
Noted  

## 2015-07-31 NOTE — Telephone Encounter (Signed)
Patient scheduled for 08/06/2015 at 1pm

## 2015-08-06 ENCOUNTER — Ambulatory Visit (INDEPENDENT_AMBULATORY_CARE_PROVIDER_SITE_OTHER): Payer: Medicare HMO

## 2015-08-06 VITALS — BP 120/82 | HR 60 | Ht 64.0 in | Wt 144.2 lb

## 2015-08-06 DIAGNOSIS — Z0000 Encounter for general adult medical examination without abnormal findings: Secondary | ICD-10-CM | POA: Diagnosis not present

## 2015-08-06 DIAGNOSIS — Z23 Encounter for immunization: Secondary | ICD-10-CM

## 2015-08-06 NOTE — Progress Notes (Signed)
Subjective:   Laura Hopkins is a 69 y.o. female who presents for Medicare Annual Initial examination.  Review of Systems: No ROS  Cardiac Risk Factors include: advanced age (>54men, >33 women);hypertension   Sleep patterns: Sleeps approximately 4-5 hours per night/has problems falling asleep and staying asleep/ sometimes have to take sleep aid/light sleeper---Discussed tips on ways to improve sleep habits Home Safety/Smoke Alarms:  Feels safe at home.  Lives with husband in 1 level home.  Smoke alarms and alarm system present.    Firearm Safety: Kept in safe place.   Seat Belt Safety/Bike Helmet:  Always wears seat belt.   Counseling:   Eye Exam- 08/2014, has upcoming appt Dental-Every 6 months.  Female:  Pap-hysterectomy   Mammo-08/20/14- has scheduled for this month.  Dexa scan-08/21/13       CCS-03/15/06   Requested to postponed Prevnar 13 vaccine and Hep C screening until next visit.    Objective:     Vitals: BP 120/82 mmHg  Pulse 60  Ht 5\' 4"  (1.626 m)  Wt 144 lb 3.2 oz (65.409 kg)  BMI 24.74 kg/m2  SpO2 98%  Tobacco History  Smoking status  . Former Smoker -- 0.30 packs/day  . Quit date: 03/07/1999  Smokeless tobacco  . Former Systems developer  . Quit date: 10/03/2000     Counseling given: Yes   Past Medical History  Diagnosis Date  . Skin cancer     basal cell  . Headache(784.0)   . Hypertension    Past Surgical History  Procedure Laterality Date  . Abdominal hysterectomy  1995  . Shoulder surgery  2000  . Nasal septum surgery  1983  . Colonoscopy  2007    colon--negative   Family History  Problem Relation Age of Onset  . Skin cancer Brother     melanoma  . Hypertension Mother   . Diabetes Father   . Hypertension Father    History  Sexual Activity  . Sexual Activity:  . Partners: Male    Outpatient Encounter Prescriptions as of 08/06/2015  Medication Sig  . [employer] FIBER GUMMIES PO Take 3 capsules by mouth at bedtime.  Marland Kitchen estradiol (VIVELLE-DOT) 0.05  MG/24HR Place 1 patch onto the skin once a week.    . Glucosamine-Chondroitin (OSTEO BI-FLEX REGULAR STRENGTH PO) Take 2 tablets by mouth daily.  . SUMAtriptan (IMITREX) 100 MG tablet Take 1 tablet by mouth as needed.  . temazepam (RESTORIL) 30 MG capsule   . valsartan-hydrochlorothiazide (DIOVAN-HCT) 80-12.5 MG per tablet Take 1 tablet by mouth daily.  Marland Kitchen zoster vaccine live, PF, (ZOSTAVAX) 95188 UNT/0.65ML injection Inject 19,400 Units into the skin once. (Patient not taking: Reported on 08/06/2015)  . [DISCONTINUED] Multiple Vitamins-Minerals (ONE-A-DAY EXTRAS ANTIOXIDANT) CAPS Take by mouth daily.     No facility-administered encounter medications on file as of 08/06/2015.    Activities of Daily Living In your present state of health, do you have any difficulty performing the following activities: 08/06/2015 10/20/2014  Hearing? N N  Vision? N N  Difficulty concentrating or making decisions? N N  Walking or climbing stairs? N N  Dressing or bathing? N N  Doing errands, shopping? N N  Preparing Food and eating ? N -  Using the Toilet? N -  In the past six months, have you accidently leaked urine? Y -  Do you have problems with loss of bowel control? N -  Managing your Medications? N -  Managing your Finances? N -  Housekeeping or managing your  Housekeeping? N -    Patient Care Team: Rosalita Chessman, DO as PCP - General Dian Queen, MD as Consulting Physician (Obstetrics and Gynecology) Michel Santee, MD as Consulting Physician (Neurology) Amy Martinique, MD as Consulting Physician (Dermatology) Could not remember eye doctor's name  Assessment:   Exercise Activities and Dietary recommendations Current Exercise Habits:: Home exercise routine, Type of exercise: walking, Time (Minutes): 45, Frequency (Times/Week): 5, Weekly Exercise (Minutes/Week): 225, Intensity: Moderate Diet:  Eats 3 meals per day.  Relatively healthy.  Loves sweet-eats every day, small portion.    Goals    . Eat  more fruits and vegetables    . Increase physical activity     Water fitness Yoga Walking       Fall Risk Fall Risk  08/06/2015 03/27/2014  Falls in the past year? No No   Depression Screen PHQ 2/9 Scores 08/06/2015 03/27/2014 08/26/2013 08/08/2012  PHQ - 2 Score 0 0 0 0     Cognitive Testing MMSE - Mini Mental State Exam 08/06/2015  Orientation to time 5  Orientation to Place 5  Registration 3  Attention/ Calculation 5  Recall 3  Language- name 2 objects 2  Language- repeat 1  Language- follow 3 step command 3  Language- read & follow direction 1  Write a sentence 1  Copy design 1  Total score 30    Immunization History  Administered Date(s) Administered  . Influenza Whole 07/03/2006, 09/11/2007, 07/15/2008, 07/30/2009  . Influenza, High Dose Seasonal PF 08/27/2014, 08/06/2015  . Influenza,inj,Quad PF,36+ Mos 08/12/2013  . Pneumococcal Polysaccharide-23 03/27/2014   Screening Tests Health Maintenance  Topic Date Due  . Hepatitis C Screening  [birth year]  . PNA vac Low Risk Adult (2 of 2 - PCV13) 03/28/2015  . INFLUENZA VACCINE  05/04/2015  . COLONOSCOPY  03/15/2016  . MAMMOGRAM  08/20/2016  . TETANUS/TDAP  03/07/2019  . DEXA SCAN  Completed  . ZOSTAVAX  Addressed      Plan:  Increase fruit and vegetable intake.  Increase physical activity to include other fitness activities such as water fitness and yoga.  Schedule an appt with Dr. Etter Sjogren (CPE vs follow up) with fasting labs.  [phone]  Complete mammogram this month (Novemeber).    Bring back a copy of advanced directive.     During the course of the visit the patient was educated and counseled about the following appropriate screening and preventive services:   Vaccines to include Pneumoccal, Influenza, Hepatitis B, Td, Zostavax, HCV  Electrocardiogram  Cardiovascular Disease  Colorectal cancer screening  Bone density screening  Diabetes screening  Glaucoma  screening  Mammography/PAP  Nutrition counseling   Patient Instructions (the written plan) was given to the patient.   Rudene Anda, RN  08/06/2015

## 2015-08-06 NOTE — Progress Notes (Signed)
reviewed

## 2015-08-06 NOTE — Progress Notes (Signed)
Pre visit review using our clinic review tool, if applicable. No additional management support is needed unless otherwise documented below in the visit note. 

## 2015-08-06 NOTE — Patient Instructions (Addendum)
Increase fruit and vegetable intake.  Increase physical activity to include other fitness activities such as water fitness and yoga.  Schedule an appt with Dr. Etter Sjogren (CPE vs follow up) with fasting labs.  [phone]  Complete mammogram this month (Novemeber).    Bring back a copy of advanced directive.    Health Maintenance, Female Adopting a healthy lifestyle and getting preventive care can go a long way to promote health and wellness. Talk with your health care provider about what schedule of regular examinations is right for you. This is a good chance for you to check in with your provider about disease prevention and staying healthy. In between checkups, there are plenty of things you can do on your own. Experts have done a lot of research about which lifestyle changes and preventive measures are most likely to keep you healthy. Ask your health care provider for more information. WEIGHT AND DIET  Eat a healthy diet  Be sure to include plenty of vegetables, fruits, low-fat dairy products, and lean protein.  Do not eat a lot of foods high in solid fats, added sugars, or salt.  Get regular exercise. This is one of the most important things you can do for your health.  Most adults should exercise for at least 150 minutes each week. The exercise should increase your heart rate and make you sweat (moderate-intensity exercise).  Most adults should also do strengthening exercises at least twice a week. This is in addition to the moderate-intensity exercise.  Maintain a healthy weight  Body mass index (BMI) is a measurement that can be used to identify possible weight problems. It estimates body fat based on height and weight. Your health care provider can help determine your BMI and help you achieve or maintain a healthy weight.  For females 67 years of age and older:   A BMI below 18.5 is considered underweight.  A BMI of 18.5 to 24.9 is normal.  A BMI of 25 to 29.9 is considered  overweight.  A BMI of 30 and above is considered obese.  Watch levels of cholesterol and blood lipids  You should start having your blood tested at 67 years of age for lipids and cholesterol at 67 years of age, then have this test every 5 years.  You may need to have your cholesterol levels checked more often if:  You are older than 67 years of age.  Your lipid or cholesterol levels are high.  You are older than 67 years of age.  You are at high risk for heart disease.  CANCER SCREENING   Lung Cancer  Lung cancer screening is recommended for adults 44-73 years old who are at high risk for lung cancer because of a history of smoking.  A yearly low-dose CT scan of the lungs is recommended for people who:  Currently smoke.  Have quit within the past 15 years.  Have at least a 30-pack-year history of smoking. A pack year is smoking an average of one pack of cigarettes a day for 1 year.  Yearly screening should continue until it has been 15 years since you quit.  Yearly screening should stop if you develop a health problem that would prevent you from having lung cancer treatment.  Breast Cancer  Practice breast self-awareness. This means understanding how your breasts normally appear and feel.  It also means doing regular breast self-exams. Let your health care provider know about any changes, no matter how small.  If you are in your 67s, you should have a clinical breast exam (CBE) by a health  care provider every 1-3 years as part of a regular health exam.  If you are 74 or older, have a CBE every year. Also consider having a breast X-ray (mammogram) every year.  If you have a family history of breast cancer, talk to your health care provider about genetic screening.  If you are at high risk for breast cancer, talk to your health care provider about having an MRI and a mammogram every year.  Breast cancer gene (BRCA) assessment is recommended for women who have family members with BRCA-related cancers. BRCA-related  cancers include:  Breast.  Ovarian.  Tubal.  Peritoneal cancers.  Results of the assessment will determine the need for genetic counseling and BRCA1 and BRCA2 testing. Cervical Cancer Your health care provider may recommend that you be screened regularly for cancer of the pelvic organs (ovaries, uterus, and vagina). This screening involves a pelvic examination, including checking for microscopic changes to the surface of your cervix (Pap test). You may be encouraged to have this screening done every 3 years, beginning at age 69.  For women ages 62-65, health care providers may recommend pelvic exams and Pap testing every 3 years, or they may recommend the Pap and pelvic exam, combined with testing for human papilloma virus (HPV), every 5 years. Some types of HPV increase your risk of cervical cancer. Testing for HPV may also be done on women of any age with unclear Pap test results.  Other health care providers may not recommend any screening for nonpregnant women who are considered low risk for pelvic cancer and who do not have symptoms. Ask your health care provider if a screening pelvic exam is right for you.  If you have had past treatment for cervical cancer or a condition that could lead to cancer, you need Pap tests and screening for cancer for at least 20 years after your treatment. If Pap tests have been discontinued, your risk factors (such as having a new sexual partner) need to be reassessed to determine if screening should resume. Some women have medical problems that increase the chance of getting cervical cancer. In these cases, your health care provider may recommend more frequent screening and Pap tests. Colorectal Cancer  This type of cancer can be detected and often prevented.  Routine colorectal cancer screening usually begins at 67 years of age and continues through 67 years of age.  Your health care provider may recommend screening at an earlier age if you have risk  factors for colon cancer.  Your health care provider may also recommend using home test kits to check for hidden blood in the stool.  A small camera at the end of a tube can be used to examine your colon directly (sigmoidoscopy or colonoscopy). This is done to check for the earliest forms of colorectal cancer.  Routine screening usually begins at age 53.  Direct examination of the colon should be repeated every 5-10 years through 67 years of age. However, you may need to be screened more often if early forms of precancerous polyps or small growths are found. Skin Cancer  Check your skin from head to toe regularly.  Tell your health care provider about any new moles or changes in moles, especially if there is a change in a mole's shape or color.  Also tell your health care provider if you have a mole that is larger than the size of a pencil eraser.  Always use sunscreen. Apply sunscreen liberally and repeatedly throughout the day.  Protect  yourself by wearing long sleeves, pants, a wide-brimmed hat, and sunglasses whenever you are outside. HEART DISEASE, DIABETES, AND HIGH BLOOD PRESSURE   High blood pressure causes heart disease and increases the risk of stroke. High blood pressure is more likely to develop in:  People who have blood pressure in the high end of the normal range (130-139/85-89 mm Hg).  People who are overweight or obese.  People who are African American.  If you are 54-24 years of age, have your blood pressure checked every 3-5 years. If you are 27 years of age or older, have your blood pressure checked every year. You should have your blood pressure measured twice--once when you are at a hospital or clinic, and once when you are not at a hospital or clinic. Record the average of the two measurements. To check your blood pressure when you are not at a hospital or clinic, you can use:  An automated blood pressure machine at a pharmacy.  A home blood pressure  monitor.  If you are between 57 years and 2 years old, ask your health care provider if you should take aspirin to prevent strokes.  Have regular diabetes screenings. This involves taking a blood sample to check your fasting blood sugar level.  If you are at a normal weight and have a low risk for diabetes, have this test once every three years after 67 years of age.  If you are overweight and have a high risk for diabetes, consider being tested at a younger age or more often. PREVENTING INFECTION  Hepatitis B  If you have a higher risk for hepatitis B, you should be screened for this virus. You are considered at high risk for hepatitis B if:  You were born in a country where hepatitis B is common. Ask your health care provider which countries are considered high risk.  Your parents were born in a high-risk country, and you have not been immunized against hepatitis B (hepatitis B vaccine).  You have HIV or AIDS.  You use needles to inject street drugs.  You live with someone who has hepatitis B.  You have had sex with someone who has hepatitis B.  You get hemodialysis treatment.  You take certain medicines for conditions, including cancer, organ transplantation, and autoimmune conditions. Hepatitis C  Blood testing is recommended for:  Everyone born from [birth year] through 1963.  Anyone with known risk factors for hepatitis C. Sexually transmitted infections (STIs)  You should be screened for sexually transmitted infections (STIs) including gonorrhea and chlamydia if:  You are sexually active and are younger than 67 years of age.  You are older than 67 years of age and your health care provider tells you that you are at risk for this type of infection.  Your sexual activity has changed since you were last screened and you are at an increased risk for chlamydia or gonorrhea. Ask your health care provider if you are at risk.  If you do not have HIV, but are at risk, it may be  recommended that you take a prescription medicine daily to prevent HIV infection. This is called pre-exposure prophylaxis (PrEP). You are considered at risk if:  You are sexually active and do not regularly use condoms or know the HIV status of your partner(s).  You take drugs by injection.  You are sexually active with a partner who has HIV. Talk with your health care provider about whether you are at high risk of being infected with  HIV. If you choose to begin PrEP, you should first be tested for HIV. You should then be tested every 3 months for as long as you are taking PrEP.  PREGNANCY   If you are premenopausal and you may become pregnant, ask your health care provider about preconception counseling.  If you may become pregnant, take 400 to 800 micrograms (mcg) of folic acid every day.  If you want to prevent pregnancy, talk to your health care provider about birth control (contraception). OSTEOPOROSIS AND MENOPAUSE   Osteoporosis is a disease in which the bones lose minerals and strength with aging. This can result in serious bone fractures. Your risk for osteoporosis can be identified using a bone density scan.  If you are 72 years of age or older, or if you are at risk for osteoporosis and fractures, ask your health care provider if you should be screened.  Ask your health care provider whether you should take a calcium or vitamin D supplement to lower your risk for osteoporosis.  Menopause may have certain physical symptoms and risks.  Hormone replacement therapy may reduce some of these symptoms and risks. Talk to your health care provider about whether hormone replacement therapy is right for you.  HOME CARE INSTRUCTIONS   Schedule regular health, dental, and eye exams.  Stay current with your immunizations.   Do not use any tobacco products including cigarettes, chewing tobacco, or electronic cigarettes.  If you are pregnant, do not drink alcohol.  If you are  breastfeeding, limit how much and how often you drink alcohol.  Limit alcohol intake to no more than 1 drink per day for nonpregnant women. One drink equals 12 ounces of beer, 5 ounces of wine, or 1 ounces of hard liquor.  Do not use street drugs.  Do not share needles.  Ask your health care provider for help if you need support or information about quitting drugs.  Tell your health care provider if you often feel depressed.  Tell your health care provider if you have ever been abused or do not feel safe at home.   This information is not intended to replace advice given to you by your health care provider. Make sure you discuss any questions you have with your health care provider.   Document Released: 04/04/2011 Document Revised: 10/10/2014 Document Reviewed: 08/21/2013 Elsevier Interactive Patient Education [employer].

## 2015-08-12 DIAGNOSIS — R69 Illness, unspecified: Secondary | ICD-10-CM | POA: Diagnosis not present

## 2015-08-12 DIAGNOSIS — G43009 Migraine without aura, not intractable, without status migrainosus: Secondary | ICD-10-CM | POA: Diagnosis not present

## 2015-08-17 DIAGNOSIS — Z0100 Encounter for examination of eyes and vision without abnormal findings: Secondary | ICD-10-CM | POA: Diagnosis not present

## 2015-08-17 DIAGNOSIS — H524 Presbyopia: Secondary | ICD-10-CM | POA: Diagnosis not present

## 2015-08-25 DIAGNOSIS — Z85828 Personal history of other malignant neoplasm of skin: Secondary | ICD-10-CM | POA: Diagnosis not present

## 2015-08-25 DIAGNOSIS — C44519 Basal cell carcinoma of skin of other part of trunk: Secondary | ICD-10-CM | POA: Diagnosis not present

## 2015-08-25 DIAGNOSIS — L814 Other melanin hyperpigmentation: Secondary | ICD-10-CM | POA: Diagnosis not present

## 2015-08-25 DIAGNOSIS — D045 Carcinoma in situ of skin of trunk: Secondary | ICD-10-CM | POA: Diagnosis not present

## 2015-08-25 DIAGNOSIS — D0471 Carcinoma in situ of skin of right lower limb, including hip: Secondary | ICD-10-CM | POA: Diagnosis not present

## 2015-08-25 DIAGNOSIS — L821 Other seborrheic keratosis: Secondary | ICD-10-CM | POA: Diagnosis not present

## 2015-08-25 DIAGNOSIS — L720 Epidermal cyst: Secondary | ICD-10-CM | POA: Diagnosis not present

## 2015-09-02 DIAGNOSIS — Z01419 Encounter for gynecological examination (general) (routine) without abnormal findings: Secondary | ICD-10-CM | POA: Diagnosis not present

## 2015-09-02 DIAGNOSIS — Z1231 Encounter for screening mammogram for malignant neoplasm of breast: Secondary | ICD-10-CM | POA: Diagnosis not present

## 2015-09-02 DIAGNOSIS — Z6825 Body mass index (BMI) 25.0-25.9, adult: Secondary | ICD-10-CM | POA: Diagnosis not present

## 2015-09-03 DIAGNOSIS — R69 Illness, unspecified: Secondary | ICD-10-CM | POA: Diagnosis not present

## 2015-09-09 DIAGNOSIS — Z85828 Personal history of other malignant neoplasm of skin: Secondary | ICD-10-CM | POA: Diagnosis not present

## 2015-09-09 DIAGNOSIS — L570 Actinic keratosis: Secondary | ICD-10-CM | POA: Diagnosis not present

## 2015-10-02 ENCOUNTER — Encounter (INDEPENDENT_AMBULATORY_CARE_PROVIDER_SITE_OTHER): Payer: Self-pay

## 2015-10-02 ENCOUNTER — Ambulatory Visit (INDEPENDENT_AMBULATORY_CARE_PROVIDER_SITE_OTHER): Payer: Medicare HMO | Admitting: Family Medicine

## 2015-10-02 ENCOUNTER — Encounter: Payer: Self-pay | Admitting: Family Medicine

## 2015-10-02 VITALS — BP 118/82 | HR 67 | Temp 98.3°F | Ht 64.0 in | Wt 147.4 lb

## 2015-10-02 DIAGNOSIS — Z1159 Encounter for screening for other viral diseases: Secondary | ICD-10-CM

## 2015-10-02 DIAGNOSIS — Z23 Encounter for immunization: Secondary | ICD-10-CM

## 2015-10-02 DIAGNOSIS — I10 Essential (primary) hypertension: Secondary | ICD-10-CM | POA: Diagnosis not present

## 2015-10-02 LAB — LIPID PANEL
CHOLESTEROL: 169 mg/dL (ref 0–200)
HDL: 60.1 mg/dL (ref >39.00)
LDL Cholesterol: 89 mg/dL (ref 0–99)
NonHDL: 108.5
Total CHOL/HDL Ratio: 3
Triglycerides: 96 mg/dL (ref 0.0–149.0)
VLDL: 19.2 mg/dL (ref 0.0–40.0)

## 2015-10-02 LAB — COMPREHENSIVE METABOLIC PANEL
ALBUMIN: 3.8 g/dL (ref 3.5–5.2)
ALK PHOS: 56 U/L (ref 39–117)
ALT: 6 U/L (ref 0–35)
AST: 18 U/L (ref 0–37)
BUN: 15 mg/dL (ref 6–23)
CALCIUM: 9.1 mg/dL (ref 8.4–10.5)
CO2: 29 mEq/L (ref 19–32)
CREATININE: 0.75 mg/dL (ref 0.40–1.20)
Chloride: 103 mEq/L (ref 96–112)
GFR: 81.91 mL/min (ref >60.00)
Glucose, Bld: 78 mg/dL (ref 70–99)
Potassium: 3.8 mEq/L (ref 3.5–5.1)
SODIUM: 139 meq/L (ref 135–145)
Total Bilirubin: 0.4 mg/dL (ref 0.2–1.2)
Total Protein: 6.4 g/dL (ref 6.0–8.3)

## 2015-10-02 MED ORDER — VALSARTAN-HYDROCHLOROTHIAZIDE 80-12.5 MG PO TABS: 1.0000 | ORAL_TABLET | Freq: Every day | ORAL | Status: DC

## 2015-10-02 NOTE — Progress Notes (Signed)
Subjective:    Patient ID: Laura Hopkins, female    DOB: [birth year], 69 y.o.   MRN: [id number]  Chief Complaint  Patient presents with  . Hypertension    f/u--Fasting    HPI Patient is in today for f/u bp.  No complaints.    Past Medical History  Diagnosis Date  . Skin cancer     basal cell  . Headache(784.0)   . Hypertension     Past Surgical History  Procedure Laterality Date  . Abdominal hysterectomy  1995  . Shoulder surgery  2000  . Nasal septum surgery  1983  . Colonoscopy  2007    colon--negative    Family History  Problem Relation Age of Onset  . Skin cancer Brother     melanoma  . Hypertension Mother   . Diabetes Father   . Hypertension Father     Social History   Social History  . Marital Status: Married    Spouse Name: N/A  . Number of Children: 2  . Years of Education: N/A   Occupational History  . postal service     retired   Social History Main Topics  . Smoking status: Former Smoker -- 0.30 packs/day    Quit date: 03/07/1999  . Smokeless tobacco: Former Systems developer    Quit date: 10/03/2000  . Alcohol Use: No  . Drug Use: No  . Sexual Activity:    Partners: Male   Other Topics Concern  . Not on file   Social History Narrative    Outpatient Prescriptions Prior to Visit  Medication Sig Dispense Refill  . [facility] FIBER GUMMIES PO Take 3 capsules by mouth at bedtime.    Marland Kitchen estradiol (VIVELLE-DOT) 0.05 MG/24HR Place 1 patch onto the skin once a week.      . Glucosamine-Chondroitin (OSTEO BI-FLEX REGULAR STRENGTH PO) Take 2 tablets by mouth daily.    . SUMAtriptan (IMITREX) 100 MG tablet Take 1 tablet by mouth as needed.    . temazepam (RESTORIL) 30 MG capsule     . valsartan-hydrochlorothiazide (DIOVAN-HCT) 80-12.5 MG per tablet Take 1 tablet by mouth daily. 90 tablet 3  . zoster vaccine live, PF, (ZOSTAVAX) 23300 UNT/0.65ML injection Inject 19,400 Units into the skin once. (Patient not taking: Reported on 08/06/2015) 1 vial 0   No  facility-administered medications prior to visit.    No Known Allergies  Review of Systems  Constitutional: Negative for fever, chills and malaise/fatigue.  HENT: Negative for congestion and hearing loss.   Eyes: Negative for discharge.  Respiratory: Negative for cough, sputum production and shortness of breath.   Cardiovascular: Negative for chest pain, palpitations and leg swelling.  Gastrointestinal: Negative for heartburn, nausea, vomiting, abdominal pain, diarrhea, constipation and blood in stool.  Genitourinary: Negative for dysuria, urgency, frequency and hematuria.  Musculoskeletal: Negative for myalgias, back pain and falls.  Skin: Negative for rash.  Neurological: Negative for dizziness, sensory change, loss of consciousness, weakness and headaches.  Endo/Heme/Allergies: Negative for environmental allergies. Does not bruise/bleed easily.  Psychiatric/Behavioral: Negative for depression and suicidal ideas. The patient is not nervous/anxious and does not have insomnia.        Objective:    Physical Exam  Constitutional: She is oriented to person, place, and time. She appears well-developed and well-nourished.  HENT:  Head: Normocephalic and atraumatic.  Eyes: Conjunctivae and EOM are normal.  Neck: Normal range of motion. Neck supple. No JVD present. Carotid bruit is not present. No thyromegaly present.  Cardiovascular: Normal  rate, regular rhythm and normal heart sounds.   No murmur heard. Pulmonary/Chest: Effort normal and breath sounds normal. No respiratory distress. She has no wheezes. She has no rales. She exhibits no tenderness.  Musculoskeletal: She exhibits no edema.  Neurological: She is alert and oriented to person, place, and time.  Psychiatric: She has a normal mood and affect.  Nursing note and vitals reviewed.   BP 118/82 mmHg  Pulse 67  Temp(Src) 98.3 F (36.8 C) (Oral)  Ht '5\' 4"'  (1.626 m)  Wt 147 lb 6.4 oz (66.86 kg)  BMI 25.29 kg/m2  SpO2 98% Wt  Readings from Last 3 Encounters:  10/02/15 147 lb 6.4 oz (66.86 kg)  08/06/15 144 lb 3.2 oz (65.409 kg)  10/20/14 151 lb 6.4 oz (68.675 kg)     Lab Results  Component Value Date   WBC 7.3 03/27/2014   HGB 12.5 03/27/2014   HCT 37.3 03/27/2014   PLT 233.0 03/27/2014   GLUCOSE 85 10/20/2014   CHOL 167 10/20/2014   TRIG 110.0 10/20/2014   HDL 61.00 10/20/2014   LDLCALC 84 10/20/2014   ALT 5 10/20/2014   AST 18 10/20/2014   NA 137 10/20/2014   K 3.9 10/20/2014   CL 103 10/20/2014   CREATININE 0.80 10/20/2014   BUN 15 10/20/2014   CO2 30 10/20/2014   TSH 1.21 03/07/2011    Lab Results  Component Value Date   TSH 1.21 03/07/2011   Lab Results  Component Value Date   WBC 7.3 03/27/2014   HGB 12.5 03/27/2014   HCT 37.3 03/27/2014   MCV 92.8 03/27/2014   PLT 233.0 03/27/2014   Lab Results  Component Value Date   NA 137 10/20/2014   K 3.9 10/20/2014   CO2 30 10/20/2014   GLUCOSE 85 10/20/2014   BUN 15 10/20/2014   CREATININE 0.80 10/20/2014   BILITOT 0.6 10/20/2014   ALKPHOS 66 10/20/2014   AST 18 10/20/2014   ALT 5 10/20/2014   PROT 6.7 10/20/2014   ALBUMIN 4.0 10/20/2014   CALCIUM 9.7 10/20/2014   GFR 76.25 10/20/2014   Lab Results  Component Value Date   CHOL 167 10/20/2014   Lab Results  Component Value Date   HDL 61.00 10/20/2014   Lab Results  Component Value Date   LDLCALC 84 10/20/2014   Lab Results  Component Value Date   TRIG 110.0 10/20/2014   Lab Results  Component Value Date   CHOLHDL 3 10/20/2014   No results found for: HGBA1C     Assessment & Plan:   Problem List Items Addressed This Visit    None    Visit Diagnoses    Essential hypertension    -  Primary    Relevant Medications    valsartan-hydrochlorothiazide (DIOVAN-HCT) 80-12.5 MG tablet    Other Relevant Orders    Comp Met (CMET)    Lipid panel    Need for pneumococcal vaccination        Relevant Orders    Pneumococcal conjugate vaccine 13-valent (Completed)     Need for hepatitis C screening test        Relevant Orders    Hepatitis C antibody       I have discontinued Ms. Nucci zoster vaccine live (PF). I have also changed her valsartan-hydrochlorothiazide. Additionally, I am having her maintain her estradiol, temazepam, SUMAtriptan, Glucosamine-Chondroitin (OSTEO BI-FLEX REGULAR STRENGTH PO), [facility] FIBER GUMMIES PO, and ALPRAZolam.  Meds ordered this encounter  Medications  . ALPRAZolam (XANAX) 0.25  MG tablet    Sig: Take 1 tablet by mouth 2 (two) times daily as needed.    Refill:  1  . valsartan-hydrochlorothiazide (DIOVAN-HCT) 80-12.5 MG tablet    Sig: Take 1 tablet by mouth daily.    Dispense:  90 tablet    Refill:  [place], DO

## 2015-10-02 NOTE — Patient Instructions (Signed)
Hypertension Hypertension, commonly called high blood pressure, is when the force of blood pumping through your arteries is too strong. Your arteries are the blood vessels that carry blood from your heart throughout your body. A blood pressure reading consists of a higher number over a lower number, such as 110/72. The higher number (systolic) is the pressure inside your arteries when your heart pumps. The lower number (diastolic) is the pressure inside your arteries when your heart relaxes. Ideally you want your blood pressure below 120/80. Hypertension forces your heart to work harder to pump blood. Your arteries may become narrow or stiff. Having untreated or uncontrolled hypertension can cause heart attack, stroke, kidney disease, and other problems. RISK FACTORS Some risk factors for high blood pressure are controllable. Others are not.  Risk factors you cannot control include:   Race. You may be at higher risk if you are African American.  Age. Risk increases with age.  Gender. Men are at higher risk than women before age 45 years. After age 65, women are at higher risk than men. Risk factors you can control include:  Not getting enough exercise or physical activity.  Being overweight.  Getting too much fat, sugar, calories, or salt in your diet.  Drinking too much alcohol. SIGNS AND SYMPTOMS Hypertension does not usually cause signs or symptoms. Extremely high blood pressure (hypertensive crisis) may cause headache, anxiety, shortness of breath, and nosebleed. DIAGNOSIS To check if you have hypertension, your health care provider will measure your blood pressure while you are seated, with your arm held at the level of your heart. It should be measured at least twice using the same arm. Certain conditions can cause a difference in blood pressure between your right and left arms. A blood pressure reading that is higher than normal on one occasion does not mean that you need treatment. If  it is not clear whether you have high blood pressure, you may be asked to return on a different day to have your blood pressure checked again. Or, you may be asked to monitor your blood pressure at home for 1 or more weeks. TREATMENT Treating high blood pressure includes making lifestyle changes and possibly taking medicine. Living a healthy lifestyle can help lower high blood pressure. You may need to change some of your habits. Lifestyle changes may include:  Following the DASH diet. This diet is high in fruits, vegetables, and whole grains. It is low in salt, red meat, and added sugars.  Keep your sodium intake below 2,300 mg per day.  Getting at least 30-45 minutes of aerobic exercise at least 4 times per week.  Losing weight if necessary.  Not smoking.  Limiting alcoholic beverages.  Learning ways to reduce stress. Your health care provider may prescribe medicine if lifestyle changes are not enough to get your blood pressure under control, and if one of the following is true:  You are 18-59 years of age and your systolic blood pressure is above 140.  You are 60 years of age or older, and your systolic blood pressure is above 150.  Your diastolic blood pressure is above 90.  You have diabetes, and your systolic blood pressure is over 140 or your diastolic blood pressure is over 90.  You have kidney disease and your blood pressure is above 140/90.  You have heart disease and your blood pressure is above 140/90. Your personal target blood pressure may vary depending on your medical conditions, your age, and other factors. HOME CARE INSTRUCTIONS    Have your blood pressure rechecked as directed by your health care provider.   Take medicines only as directed by your health care provider. Follow the directions carefully. Blood pressure medicines must be taken as prescribed. The medicine does not work as well when you skip doses. Skipping doses also puts you at risk for  problems.  Do not smoke.   Monitor your blood pressure at home as directed by your health care provider. SEEK MEDICAL CARE IF:   You think you are having a reaction to medicines taken.  You have recurrent headaches or feel dizzy.  You have swelling in your ankles.  You have trouble with your vision. SEEK IMMEDIATE MEDICAL CARE IF:  You develop a severe headache or confusion.  You have unusual weakness, numbness, or feel faint.  You have severe chest or abdominal pain.  You vomit repeatedly.  You have trouble breathing. MAKE SURE YOU:   Understand these instructions.  Will watch your condition.  Will get help right away if you are not doing well or get worse.   This information is not intended to replace advice given to you by your health care provider. Make sure you discuss any questions you have with your health care provider.   Document Released: 09/19/2005 Document Revised: 02/03/2015 Document Reviewed: 07/12/2013 Elsevier Interactive Patient Education 2016 [employer]  

## 2015-10-02 NOTE — Progress Notes (Signed)
Pre visit review using our clinic review tool, if applicable. No additional management support is needed unless otherwise documented below in the visit note. 

## 2015-10-03 LAB — HEPATITIS C ANTIBODY: HCV Ab: NEGATIVE

## 2015-11-23 DIAGNOSIS — G44219 Episodic tension-type headache, not intractable: Secondary | ICD-10-CM | POA: Diagnosis not present

## 2015-11-23 DIAGNOSIS — Z85828 Personal history of other malignant neoplasm of skin: Secondary | ICD-10-CM | POA: Diagnosis not present

## 2015-11-23 DIAGNOSIS — L570 Actinic keratosis: Secondary | ICD-10-CM | POA: Diagnosis not present

## 2015-11-23 DIAGNOSIS — G43009 Migraine without aura, not intractable, without status migrainosus: Secondary | ICD-10-CM | POA: Diagnosis not present

## 2015-11-30 DIAGNOSIS — R69 Illness, unspecified: Secondary | ICD-10-CM | POA: Diagnosis not present

## 2015-12-16 ENCOUNTER — Other Ambulatory Visit: Payer: Self-pay | Admitting: Family Medicine

## 2015-12-16 NOTE — Telephone Encounter (Signed)
Refilled patients medication with #90 with 0rf.

## 2016-01-18 DIAGNOSIS — R69 Illness, unspecified: Secondary | ICD-10-CM | POA: Diagnosis not present

## 2016-02-17 ENCOUNTER — Encounter: Payer: Self-pay | Admitting: Gastroenterology

## 2016-03-13 ENCOUNTER — Other Ambulatory Visit: Payer: Self-pay | Admitting: Family Medicine

## 2016-03-23 ENCOUNTER — Ambulatory Visit (AMBULATORY_SURGERY_CENTER): Payer: Self-pay | Admitting: *Deleted

## 2016-03-23 VITALS — Ht 64.5 in | Wt 148.0 lb

## 2016-03-23 DIAGNOSIS — Z1211 Encounter for screening for malignant neoplasm of colon: Secondary | ICD-10-CM

## 2016-03-23 MED ORDER — NA SULFATE-K SULFATE-MG SULF 17.5-3.13-1.6 GM/177ML PO SOLN: 1.0000 | Freq: Once | ORAL | Status: DC

## 2016-03-23 NOTE — Progress Notes (Signed)
No egg or soy allergy known to patient  No issues with past sedation with any surgeries  or procedures except post op N/V , no intubation problems  No diet pills per patient No home 02 use per patient  No blood thinners per patient  Pt denies issues with constipation  emmi declined

## 2016-03-31 ENCOUNTER — Encounter: Payer: Self-pay | Admitting: Gastroenterology

## 2016-04-12 ENCOUNTER — Encounter: Payer: Self-pay | Admitting: Gastroenterology

## 2016-04-12 ENCOUNTER — Ambulatory Visit (AMBULATORY_SURGERY_CENTER): Payer: Medicare HMO | Admitting: Gastroenterology

## 2016-04-12 VITALS — BP 112/65 | HR 55 | Temp 97.8°F | Resp 16 | Ht 64.5 in | Wt 148.0 lb

## 2016-04-12 DIAGNOSIS — I10 Essential (primary) hypertension: Secondary | ICD-10-CM | POA: Diagnosis not present

## 2016-04-12 DIAGNOSIS — Z1211 Encounter for screening for malignant neoplasm of colon: Secondary | ICD-10-CM

## 2016-04-12 MED ORDER — SODIUM CHLORIDE 0.9 % IV SOLN: 500.0000 mL | INTRAVENOUS | Status: DC

## 2016-04-12 NOTE — Patient Instructions (Signed)
YOU HAD AN ENDOSCOPIC PROCEDURE TODAY AT [facility]:   Refer to the procedure report that was given to you for any specific questions about what was found during the examination.  If the procedure report does not answer your questions, please call your gastroenterologist to clarify.  If you requested that your care partner not be given the details of your procedure findings, then the procedure report has been included in a sealed envelope for you to review at your convenience later.  YOU SHOULD EXPECT: Some feelings of bloating in the abdomen. Passage of more gas than usual.  Walking can help get rid of the air that was put into your GI tract during the procedure and reduce the bloating. If you had a lower endoscopy (such as a colonoscopy or flexible sigmoidoscopy) you may notice spotting of blood in your stool or on the toilet paper. If you underwent a bowel prep for your procedure, you may not have a normal bowel movement for a few days.  Please Note:  You might notice some irritation and congestion in your nose or some drainage.  This is from the oxygen used during your procedure.  There is no need for concern and it should clear up in a day or so.  SYMPTOMS TO REPORT IMMEDIATELY:   Following lower endoscopy (colonoscopy or flexible sigmoidoscopy):  Excessive amounts of blood in the stool  Significant tenderness or worsening of abdominal pains  Swelling of the abdomen that is new, acute  Fever of 100F or higher   For urgent or emergent issues, a gastroenterologist can be reached at any hour by calling [phone].   DIET: Your first meal following the procedure should be a small meal and then it is ok to progress to your normal diet. Heavy or fried foods are harder to digest and may make you feel nauseous or bloated.  Likewise, meals heavy in dairy and vegetables can increase bloating.  Drink plenty of fluids but you should avoid alcoholic beverages for 24  hours.  ACTIVITY:  You should plan to take it easy for the rest of today and you should NOT DRIVE or use heavy machinery until tomorrow (because of the sedation medicines used during the test).    FOLLOW UP: Our staff will call the number listed on your records the next business day following your procedure to check on you and address any questions or concerns that you may have regarding the information given to you following your procedure. If we do not reach you, we will leave a message.  However, if you are feeling well and you are not experiencing any problems, there is no need to return our call.  We will assume that you have returned to your regular daily activities without incident.  If any biopsies were taken you will be contacted by phone or by letter within the next 1-3 weeks.  Please call us at [phone] if you have not heard about the biopsies in 3 weeks.    SIGNATURES/CONFIDENTIALITY: You and/or your care partner have signed paperwork which will be entered into your electronic medical record.  These signatures attest to the fact that that the information above on your After Visit Summary has been reviewed and is understood.  Full responsibility of the confidentiality of this discharge information lies with you and/or your care-partner.  Diverticulosis and hemorrhoids handout given. Resume previous medications.

## 2016-04-12 NOTE — Op Note (Signed)
[facility] Patient Name: Laura Hopkins Procedure Date: 04/12/2016 1:12 PM MRN: [id number] Endoscopist: Mauri Pole , MD Age: 70 Referring MD:  Date of Birth: 15-Oct-1945 Gender: Female Account #: [id number] Procedure:                Colonoscopy Indications:              Screening for colorectal malignant neoplasm, Last                            colonoscopy 10 years ago Medicines:                Monitored Anesthesia Care Procedure:                Pre-Anesthesia Assessment:                           - Prior to the procedure, a History and Physical                            was performed, and patient medications and                            allergies were reviewed. The patient's tolerance of                            previous anesthesia was also reviewed. The risks                            and benefits of the procedure and the sedation                            options and risks were discussed with the patient.                            All questions were answered, and informed consent                            was obtained. Prior Anticoagulants: The patient has                            taken no previous anticoagulant or antiplatelet                            agents. ASA Grade Assessment: II - A patient with                            mild systemic disease. After reviewing the risks                            and benefits, the patient was deemed in                            satisfactory condition to undergo the procedure.  After obtaining informed consent, the colonoscope                            was passed under direct vision. Throughout the                            procedure, the patient's blood pressure, pulse, and                            oxygen saturations were monitored continuously. The                            Model CF-HQ190L [id number]) scope was introduced                            through the anus and advanced  to the the terminal                            ileum, with identification of the appendiceal                            orifice and IC valve. The colonoscopy was performed                            without difficulty. The patient tolerated the                            procedure well. The quality of the bowel                            preparation was good. The terminal ileum, ileocecal                            valve, appendiceal orifice, and rectum were                            photographed. Scope In: 1:44:47 PM Scope Out: 2:03:57 PM Scope Withdrawal Time: 0 hours 11 minutes 42 seconds  Total Procedure Duration: 0 hours 19 minutes 10 seconds  Findings:                 The perianal and digital rectal examinations were                            normal.                           Multiple small and large-mouthed diverticula were                            found in the sigmoid colon and descending colon.                           Non-bleeding internal hemorrhoids were found during  retroflexion. The hemorrhoids were small. Complications:            No immediate complications. Estimated Blood Loss:     Estimated blood loss: none. Impression:               - Diverticulosis in the sigmoid colon and in the                            descending colon.                           - Non-bleeding internal hemorrhoids.                           - No specimens collected. Recommendation:           - Patient has a contact number available for                            emergencies. The signs and symptoms of potential                            delayed complications were discussed with the                            patient. Return to normal activities tomorrow.                            Written discharge instructions were provided to the                            patient.                           - Resume previous diet.                           - Continue present  medications.                           - Repeat colonoscopy in 10 years for screening                            purposes.                           - Return to GI clinic PRN. Mauri Pole, MD 04/12/2016 2:10:41 PM This report has been signed electronically.

## 2016-04-12 NOTE — Progress Notes (Signed)
To recovery, report to Schwartz, RN, VSS. 

## 2016-04-13 ENCOUNTER — Telehealth: Payer: Self-pay

## 2016-04-13 NOTE — Telephone Encounter (Signed)
Left message on machine.

## 2016-05-16 DIAGNOSIS — G44219 Episodic tension-type headache, not intractable: Secondary | ICD-10-CM | POA: Diagnosis not present

## 2016-05-16 DIAGNOSIS — G43009 Migraine without aura, not intractable, without status migrainosus: Secondary | ICD-10-CM | POA: Diagnosis not present

## 2016-05-24 ENCOUNTER — Encounter: Payer: Self-pay | Admitting: Family Medicine

## 2016-05-24 DIAGNOSIS — L738 Other specified follicular disorders: Secondary | ICD-10-CM | POA: Diagnosis not present

## 2016-05-24 DIAGNOSIS — Z85828 Personal history of other malignant neoplasm of skin: Secondary | ICD-10-CM | POA: Diagnosis not present

## 2016-05-24 DIAGNOSIS — D225 Melanocytic nevi of trunk: Secondary | ICD-10-CM | POA: Diagnosis not present

## 2016-05-24 DIAGNOSIS — D2272 Melanocytic nevi of left lower limb, including hip: Secondary | ICD-10-CM | POA: Diagnosis not present

## 2016-05-24 DIAGNOSIS — D0472 Carcinoma in situ of skin of left lower limb, including hip: Secondary | ICD-10-CM | POA: Diagnosis not present

## 2016-05-24 DIAGNOSIS — L720 Epidermal cyst: Secondary | ICD-10-CM | POA: Diagnosis not present

## 2016-05-24 DIAGNOSIS — L821 Other seborrheic keratosis: Secondary | ICD-10-CM | POA: Diagnosis not present

## 2016-05-24 DIAGNOSIS — L570 Actinic keratosis: Secondary | ICD-10-CM | POA: Diagnosis not present

## 2016-05-30 DIAGNOSIS — R69 Illness, unspecified: Secondary | ICD-10-CM | POA: Diagnosis not present

## 2016-06-01 ENCOUNTER — Other Ambulatory Visit: Payer: Self-pay | Admitting: Family Medicine

## 2016-06-08 DIAGNOSIS — Z85828 Personal history of other malignant neoplasm of skin: Secondary | ICD-10-CM | POA: Diagnosis not present

## 2016-06-08 DIAGNOSIS — D0472 Carcinoma in situ of skin of left lower limb, including hip: Secondary | ICD-10-CM | POA: Diagnosis not present

## 2016-07-28 DIAGNOSIS — J029 Acute pharyngitis, unspecified: Secondary | ICD-10-CM | POA: Diagnosis not present

## 2016-07-30 DIAGNOSIS — J069 Acute upper respiratory infection, unspecified: Secondary | ICD-10-CM | POA: Diagnosis not present

## 2016-08-04 ENCOUNTER — Ambulatory Visit: Payer: Medicare HMO | Admitting: *Deleted

## 2016-08-05 ENCOUNTER — Ambulatory Visit: Payer: Medicare HMO | Admitting: *Deleted

## 2016-08-08 ENCOUNTER — Encounter: Payer: Self-pay | Admitting: Family Medicine

## 2016-08-08 ENCOUNTER — Ambulatory Visit (INDEPENDENT_AMBULATORY_CARE_PROVIDER_SITE_OTHER): Payer: Medicare HMO | Admitting: Family Medicine

## 2016-08-08 ENCOUNTER — Ambulatory Visit: Payer: Medicare HMO | Admitting: *Deleted

## 2016-08-08 VITALS — BP 128/82 | HR 60 | Resp 16 | Ht 64.0 in | Wt 147.4 lb

## 2016-08-08 DIAGNOSIS — B001 Herpesviral vesicular dermatitis: Secondary | ICD-10-CM | POA: Diagnosis not present

## 2016-08-08 DIAGNOSIS — I10 Essential (primary) hypertension: Secondary | ICD-10-CM | POA: Diagnosis not present

## 2016-08-08 DIAGNOSIS — Z0000 Encounter for general adult medical examination without abnormal findings: Secondary | ICD-10-CM

## 2016-08-08 LAB — LIPID PANEL
CHOL/HDL RATIO: 3
CHOLESTEROL: 179 mg/dL (ref 0–200)
HDL: 63.2 mg/dL (ref >39.00)
LDL Cholesterol: 97 mg/dL (ref 0–99)
NonHDL: 116.14
TRIGLYCERIDES: 98 mg/dL (ref 0.0–149.0)
VLDL: 19.6 mg/dL (ref 0.0–40.0)

## 2016-08-08 LAB — COMPREHENSIVE METABOLIC PANEL
ALBUMIN: 4.1 g/dL (ref 3.5–5.2)
ALK PHOS: 61 U/L (ref 39–117)
ALT: 8 U/L (ref 0–35)
AST: 19 U/L (ref 0–37)
BILIRUBIN TOTAL: 0.5 mg/dL (ref 0.2–1.2)
BUN: 14 mg/dL (ref 6–23)
CALCIUM: 10 mg/dL (ref 8.4–10.5)
CO2: 30 mEq/L (ref 19–32)
CREATININE: 0.74 mg/dL (ref 0.40–1.20)
Chloride: 102 mEq/L (ref 96–112)
GFR: 82.98 mL/min (ref >60.00)
Glucose, Bld: 84 mg/dL (ref 70–99)
Potassium: 3.5 mEq/L (ref 3.5–5.1)
Sodium: 138 mEq/L (ref 135–145)
Total Protein: 6.7 g/dL (ref 6.0–8.3)

## 2016-08-08 LAB — CBC
HCT: 39.9 % (ref 36.0–46.0)
Hemoglobin: 13.4 g/dL (ref 12.0–15.0)
MCHC: 33.7 g/dL (ref 30.0–36.0)
MCV: 92.8 fl (ref 78.0–100.0)
PLATELETS: 293 10*3/uL (ref 150.0–400.0)
RBC: 4.3 Mil/uL (ref 3.87–5.11)
RDW: 13.4 % (ref 11.5–15.5)
WBC: 7.6 10*3/uL (ref 4.0–10.5)

## 2016-08-08 MED ORDER — VALACYCLOVIR HCL 1 G PO TABS
1000.0000 mg | ORAL_TABLET | Freq: Three times a day (TID) | ORAL | 5 refills | Status: DC
Start: 2016-08-08 — End: 2017-07-19

## 2016-08-08 MED ORDER — VALSARTAN-HYDROCHLOROTHIAZIDE 80-12.5 MG PO TABS: 1.0000 | ORAL_TABLET | Freq: Every day | ORAL | 3 refills | Status: DC

## 2016-08-08 NOTE — Progress Notes (Signed)
Subjective:   Laura Hopkins is a 70 y.o. female who presents for Medicare Annual (Subsequent) preventive examination.  Review of Systems:  No ROS.  Medicare Wellness Visit.  Cardiac Risk Factors include: advanced age (>59men, >59 women);hypertension  Sleep patterns: Does not sleep well. Takes temazepam rarely. Sleeps 3-4 hrs nightly. Does not feel rested on waking. Trouble falling asleep and staying asleep. Has tried Ambien and melatonin in the past. Does not get up to void.   Home Safety/Smoke Alarms: Lives at home w/ husband and dog. Smoke detectors in home. Feels safe in home.    Living environment; residence and Firearm Safety: Keeps loaded firearm in bedside table. Stores in a locked cabinet when grandchildren are in the home. Encouraged safe storage and keeping ammo separate from firearm. Seat Belt Safety/Bike Helmet: Wears seat belt.    Counseling:   Dental- Dr. Windy Carina every 6 months  Female:   Pap- Hysterectomy      Mammo- last 08/20/14. Next scheduled for December. Follows w/ Dr. Helane Rima. Dexa scan- last 08/21/13, results not on file. Follows w/ Dr. Helane Rima.        CCS- last 04/12/16 w/ Dr. Silverio Decamp. Diverticulosis in the sigmoid colon and in the descending colon.   .    Objective:     Vitals: BP 128/82 (BP Location: Right Arm, Patient Position: Sitting, Cuff Size: Normal)   Pulse 60   Resp 16   Ht 5\' 4"  (1.626 m)   Wt 147 lb 6.4 oz (66.9 kg)   SpO2 98%   BMI 25.30 kg/m   Body mass index is 25.3 kg/m.   Tobacco History  Smoking Status  . Former Smoker  . Packs/day: 0.30  . Quit date: 03/07/1999  Smokeless Tobacco  . Never Used     Counseling given: Not Answered   Past Medical History:  Diagnosis Date  . Arthritis    hands and neck   . Headache(784.0)   . Hypertension   . Skin cancer    basal cell   Past Surgical History:  Procedure Laterality Date  . ABDOMINAL HYSTERECTOMY  1995  . COLONOSCOPY  2007   colon--negative  . NASAL SEPTUM SURGERY   1983  . SHOULDER SURGERY  2000  . TUBAL LIGATION  1978  . WISDOM TOOTH EXTRACTION     Family History  Problem Relation Age of Onset  . Diabetes Father   . Hypertension Father   . Skin cancer Brother     melanoma  . Hypertension Mother   . Colon cancer Neg Hx   . Colon polyps Neg Hx   . Esophageal cancer Neg Hx   . Rectal cancer Neg Hx   . Stomach cancer Neg Hx    History  Sexual Activity  . Sexual activity: Yes  . Partners: Male    Outpatient Encounter Prescriptions as of 08/08/2016  Medication Sig  . ALPRAZolam (XANAX) 0.25 MG tablet Take 1 tablet by mouth 2 (two) times daily as needed.  . cyanocobalamin 500 MCG tablet Take 500 mcg by mouth daily.  . CYANOCOBALAMIN PO Take by mouth daily.  Marland Kitchen estradiol (VIVELLE-DOT) 0.05 MG/24HR Place 1 patch onto the skin once a week.    . Glucosamine-Chondroitin (OSTEO BI-FLEX REGULAR STRENGTH PO) Take 2 tablets by mouth daily.  . Probiotic Product (PROBIOTIC DAILY PO) Take 1 capsule by mouth daily. philips colon health  . SUMAtriptan (IMITREX) 100 MG tablet Take 1 tablet by mouth as needed.  . temazepam (RESTORIL) 30 MG capsule Take  30 mg by mouth at bedtime as needed.   . valsartan-hydrochlorothiazide (DIOVAN-HCT) 80-12.5 MG tablet TAKE 1 TABLET BY MOUTH DAILY   No facility-administered encounter medications on file as of 08/08/2016.     Activities of Daily Living In your present state of health, do you have any difficulty performing the following activities: 08/08/2016  Hearing? N  Vision? N  Difficulty concentrating or making decisions? N  Walking or climbing stairs? N  Dressing or bathing? N  Doing errands, shopping? N  Preparing Food and eating ? N  Using the Toilet? N  In the past six months, have you accidently leaked urine? Y  Do you have problems with loss of bowel control? N  Managing your Medications? N  Managing your Finances? N  Housekeeping or managing your Housekeeping? N  Some recent data might be hidden    Hearing/Vision Screen Hearing Screening Comments: Able to hear conversational tones w/o difficulty. No issues reported.  Vision Screening Comments: Wears reading glasses. Has cataracts. Dr. Jerline Pain in [facility] yearly.   Patient Care Team: Ann Held, DO as PCP - General Dian Queen, MD as Consulting Physician (Obstetrics and Gynecology) Michel Santee, MD as Consulting Physician (Neurology) Amy Martinique, MD as Consulting Physician (Dermatology)    Assessment:    Physical assessment deferred to PCP.  Exercise Activities and Dietary recommendations Current Exercise Habits: Home exercise routine, Type of exercise: walking, Time (Minutes): 30, Frequency (Times/Week): 7, Weekly Exercise (Minutes/Week): 210  Diet (meal preparation, eat out, water intake, caffeinated beverages, dairy products, fruits and vegetables): in general, a "healthy" diet  , well balanced, on average, 3 meals per day. Combinations of eating at home and eating out. Eats meats and vegetables. Drinks lots of water and 1-2 cups coffee daily. Breakfast: Cereal and coffee or egg and muffin Lunch: Leftovers or sandwich. Dinner: Varies. Pt usually cooks meal of meat and veggies.      Goals    . Eat more fruits and vegetables    . Increase physical activity          Water fitness Yoga Walking       Fall Risk Fall Risk  08/08/2016 08/06/2015 03/27/2014  Falls in the past year? No No No   Depression Screen PHQ 2/9 Scores 08/08/2016 08/06/2015 03/27/2014 08/26/2013  PHQ - 2 Score 0 0 0 0     Cognitive Function MMSE - Mini Mental State Exam 08/06/2015  Orientation to time 5  Orientation to Place 5  Registration 3  Attention/ Calculation 5  Recall 3  Language- name 2 objects 2  Language- repeat 1  Language- follow 3 step command 3  Language- read & follow direction 1  Write a sentence 1  Copy design 1  Total score 30        Immunization History  Administered Date(s) Administered  . Influenza  Whole 07/03/2006, 09/11/2007, 07/15/2008, 07/30/2009  . Influenza, High Dose Seasonal PF 08/27/2014, 08/06/2015  . Influenza,inj,Quad PF,36+ Mos 08/12/2013  . Pneumococcal Conjugate-13 10/02/2015  . Pneumococcal Polysaccharide-23 03/27/2014   Screening Tests Health Maintenance  Topic Date Due  . INFLUENZA VACCINE  05/03/2016  . MAMMOGRAM  08/20/2016  . TETANUS/TDAP  03/07/2019  . COLONOSCOPY  04/12/2026  . DEXA SCAN  Completed  . ZOSTAVAX  Addressed  . Hepatitis C Screening  Completed  . PNA vac Low Risk Adult  Completed      Plan:   Follow-up w/ Dr. Carollee Herter as directed.  Flu shot next week (still recovering  from URI).   Bring a copy of your advanced directives to your next office visit.  Schedule bone density (follows w/ Dr. Christen Butter office for this).  During the course of the visit the patient was educated and counseled about the following appropriate screening and preventive services:   Vaccines to include Pneumoccal, Influenza, Hepatitis B, Td, Zostavax, HCV  Cardiovascular Disease  Colorectal cancer screening  Bone density screening  Diabetes screening  Glaucoma screening  Mammography/PAP  Nutrition counseling   Patient Instructions (the written plan) was given to the patient.   Dorrene German, RN  08/08/2016

## 2016-08-08 NOTE — Progress Notes (Signed)
0Subjective:     Laura Hopkins is a 70 y.o. female and is here for a comprehensive physical exam. The patient reports no problems.--- getting over uri  Social History   Social History  . Marital status: Married    Spouse name: N/A  . Number of children: 2  . Years of education: N/A   Occupational History  . postal service Unemployed    retired   Social History Main Topics  . Smoking status: Former Smoker    Packs/day: 0.30    Quit date: 03/07/1999  . Smokeless tobacco: Never Used  . Alcohol use No  . Drug use: No  . Sexual activity: Yes    Partners: Male   Other Topics Concern  . Not on file   Social History Narrative  . No narrative on file   Health Maintenance  Topic Date Due  . INFLUENZA VACCINE  12/31/2016 (Originally 05/03/2016)  . MAMMOGRAM  08/20/2016  . TETANUS/TDAP  03/07/2019  . COLONOSCOPY  04/12/2026  . DEXA SCAN  Completed  . ZOSTAVAX  Addressed  . Hepatitis C Screening  Completed  . PNA vac Low Risk Adult  Completed    The following portions of the patient's history were reviewed and updated as appropriate:  She  has a past medical history of Arthritis; Headache(784.0); Hypertension; and Skin cancer. She  does not have any pertinent problems on file. She  has a past surgical history that includes Abdominal hysterectomy (1995); Shoulder surgery (2000); Nasal septum surgery (1983); Colonoscopy (2007); Wisdom tooth extraction; and Tubal ligation (1978). Her family history includes Diabetes in her father; Hypertension in her father and mother; Skin cancer in her brother. She  reports that she quit smoking about 17 years ago. She smoked 0.30 packs per day. She has never used smokeless tobacco. She reports that she does not drink alcohol or use drugs. She has a current medication list which includes the following prescription(s): alprazolam, cyanocobalamin, cyanocobalamin, estradiol, glucosamine-chondroitin, probiotic product, sumatriptan, temazepam,  valsartan-hydrochlorothiazide, and valacyclovir. Current Outpatient Prescriptions on File Prior to Visit  Medication Sig Dispense Refill  . ALPRAZolam (XANAX) 0.25 MG tablet Take 1 tablet by mouth 2 (two) times daily as needed.  1  . estradiol (VIVELLE-DOT) 0.05 MG/24HR Place 1 patch onto the skin once a week.      . Glucosamine-Chondroitin (OSTEO BI-FLEX REGULAR STRENGTH PO) Take 2 tablets by mouth daily.    . Probiotic Product (PROBIOTIC DAILY PO) Take 1 capsule by mouth daily. philips colon health    . SUMAtriptan (IMITREX) 100 MG tablet Take 1 tablet by mouth as needed.    . temazepam (RESTORIL) 30 MG capsule Take 30 mg by mouth at bedtime as needed.      No current facility-administered medications on file prior to visit.    She is allergic to codeine..  Review of Systems Review of Systems  Constitutional: Negative for activity change, appetite change and fatigue.  HENT: Negative for hearing loss, congestion, tinnitus and ear discharge.  dentist q16mEyes: Negative for visual disturbance (see optho q1y -- vision corrected to 20/20 with glasses).  Respiratory: Negative for cough, chest tightness and shortness of breath.   Cardiovascular: Negative for chest pain, palpitations and leg swelling.  Gastrointestinal: Negative for abdominal pain, diarrhea, constipation and abdominal distention.  Genitourinary: Negative for urgency, frequency, decreased urine volume and difficulty urinating.  Musculoskeletal: Negative for back pain, arthralgias and gait problem.  Skin: Negative for color change, pallor and rash.  Neurological: Negative for dizziness,  light-headedness, numbness and headaches.  Hematological: Negative for adenopathy. Does not bruise/bleed easily.  Psychiatric/Behavioral: Negative for suicidal ideas, confusion, sleep disturbance, self-injury, dysphoric mood, decreased concentration and agitation.      Objective:    BP 128/82 (BP Location: Right Arm, Patient Position:  Sitting, Cuff Size: Normal)   Pulse 60   Resp 16   Ht _0  (1.626 m)   Wt 147 lb 6.4 oz (66.9 kg)   SpO2 98%   BMI 25.30 kg/m  General appearance: alert, cooperative, appears stated age and no distress Head: Normocephalic, without obvious abnormality, atraumatic Eyes: conjunctivae/corneas clear. PERRL, EOM's intact. Fundi benign. Ears: normal TM's and external ear canals both ears Nose: Nares normal. Septum midline. Mucosa normal. No drainage or sinus tenderness. Throat: lips, mucosa, and tongue normal; teeth and gums normal Neck: no adenopathy, no carotid bruit, no JVD, supple, symmetrical, trachea midline and thyroid not enlarged, symmetric, no tenderness/mass/nodules Back: symmetric, no curvature. ROM normal. No CVA tenderness. Lungs: clear to auscultation bilaterally Breasts: gyn Heart: regular rate and rhythm, S1, S2 normal, no murmur, click, rub or gallop Abdomen: soft, non-tender; bowel sounds normal; no masses,  no organomegaly Pelvic: deferred --gyn Extremities: extremities normal, atraumatic, no cyanosis or edema Pulses: 2+ and symmetric Skin: Skin color, texture, turgor normal. No rashes or lesions Lymph nodes: Cervical, supraclavicular, and axillary nodes normal. Neurologic: Alert and oriented X 3, normal strength and tone. Normal symmetric reflexes. Normal coordination and gait Mental status: Alert, oriented, thought content appropriate    Assessment:    Healthy female exam.      Plan:    ghm utd Check labs See After Visit Summary for Counseling Recommendations    1. Encounter for Medicare annual wellness exam See above -- done with carolyn Visit reviewed  2. Essential hypertension stable - Lipid Profile - Comp Met (CMET) - CBC - valsartan-hydrochlorothiazide (DIOVAN-HCT) 80-12.5 MG tablet; Take 1 tablet by mouth daily.  Dispense: 90 tablet; Refill: 3  3. Preventative health care See above - CBC

## 2016-08-08 NOTE — Progress Notes (Deleted)
Subjective:   Laura Hopkins is a 70 y.o. female who presents for Medicare Annual (Subsequent) preventive examination.  Review of Systems:  No ROS.  Medicare Wellness Visit.     Sleep patterns:    Home Safety/Smoke Alarms:   Living environment; residence and Firearm Safety:  Seat Belt Safety/Bike Helmet:    Counseling:   Eye Exam-  Dental-  Female:   Pap- Hysterectomy      Mammo- last 08/20/14     Dexa scan- last 08/21/13       CCS- last 04/12/16 w/ Dr. Silverio Decamp. Diverticulosis in the sigmoid colon and in the descending colon. 10 year recall.     Objective:     Vitals: There were no vitals taken for this visit.  There is no height or weight on file to calculate BMI.   Tobacco History  Smoking Status  . Former Smoker  . Packs/day: 0.30  . Quit date: 03/07/1999  Smokeless Tobacco  . Never Used     Counseling given: Not Answered   Past Medical History:  Diagnosis Date  . Arthritis    hands and neck   . Headache(784.0)   . Hypertension   . Skin cancer    basal cell   Past Surgical History:  Procedure Laterality Date  . ABDOMINAL HYSTERECTOMY  1995  . COLONOSCOPY  2007   colon--negative  . NASAL SEPTUM SURGERY  1983  . SHOULDER SURGERY  2000  . TUBAL LIGATION  1978  . WISDOM TOOTH EXTRACTION     Family History  Problem Relation Age of Onset  . Skin cancer Brother     melanoma  . Hypertension Mother   . Diabetes Father   . Hypertension Father   . Colon cancer Neg Hx   . Colon polyps Neg Hx   . Esophageal cancer Neg Hx   . Rectal cancer Neg Hx   . Stomach cancer Neg Hx    History  Sexual Activity  . Sexual activity: Yes  . Partners: Male    Outpatient Encounter Prescriptions as of 08/08/2016  Medication Sig  . ALPRAZolam (XANAX) 0.25 MG tablet Take 1 tablet by mouth 2 (two) times daily as needed.  Marland Kitchen estradiol (VIVELLE-DOT) 0.05 MG/24HR Place 1 patch onto the skin once a week.    . Glucosamine-Chondroitin (OSTEO BI-FLEX REGULAR STRENGTH PO)  Take 2 tablets by mouth daily.  . Probiotic Product (PROBIOTIC DAILY PO) Take 1 capsule by mouth daily. philips colon health  . SUMAtriptan (IMITREX) 100 MG tablet Take 1 tablet by mouth as needed.  . temazepam (RESTORIL) 30 MG capsule Take 30 mg by mouth at bedtime as needed.   . valsartan-hydrochlorothiazide (DIOVAN-HCT) 80-12.5 MG tablet TAKE 1 TABLET BY MOUTH DAILY   No facility-administered encounter medications on file as of 08/08/2016.     Activities of Daily Living No flowsheet data found.  Patient Care Team: Ann Held, DO as PCP - General Dian Queen, MD as Consulting Physician (Obstetrics and Gynecology) Michel Santee, MD as Consulting Physician (Neurology) Amy Martinique, MD as Consulting Physician (Dermatology)    Assessment:    Physical assessment deferred to PCP.  Exercise Activities and Dietary recommendations    Goals    . Eat more fruits and vegetables    . Increase physical activity          Water fitness Yoga Walking       Fall Risk Fall Risk  08/06/2015 03/27/2014  Falls in the past year? No No  Depression Screen PHQ 2/9 Scores 08/06/2015 03/27/2014 08/26/2013 08/08/2012  PHQ - 2 Score 0 0 0 0     Cognitive Function MMSE - Mini Mental State Exam 08/06/2015  Orientation to time 5  Orientation to Place 5  Registration 3  Attention/ Calculation 5  Recall 3  Language- name 2 objects 2  Language- repeat 1  Language- follow 3 step command 3  Language- read & follow direction 1  Write a sentence 1  Copy design 1  Total score 30        Immunization History  Administered Date(s) Administered  . Influenza Whole 07/03/2006, 09/11/2007, 07/15/2008, 07/30/2009  . Influenza, High Dose Seasonal PF 08/27/2014, 08/06/2015  . Influenza,inj,Quad PF,36+ Mos 08/12/2013  . Pneumococcal Conjugate-13 10/02/2015  . Pneumococcal Polysaccharide-23 03/27/2014   Screening Tests Health Maintenance  Topic Date Due  . INFLUENZA VACCINE  05/03/2016  .  MAMMOGRAM  08/20/2016  . TETANUS/TDAP  03/07/2019  . COLONOSCOPY  04/12/2026  . DEXA SCAN  Completed  . ZOSTAVAX  Addressed  . Hepatitis C Screening  Completed  . PNA vac Low Risk Adult  Completed      Plan:    Follow-up with Dr. Carollee Herter as scheduled.  During the course of the visit the patient was educated and counseled about the following appropriate screening and preventive services:   Vaccines to include Pneumoccal, Influenza, Hepatitis B, Td, Zostavax, HCV  Electrocardiogram  Cardiovascular Disease  Colorectal cancer screening  Bone density screening  Diabetes screening  Glaucoma screening  Mammography/PAP  Nutrition counseling   Patient Instructions (the written plan) was given to the patient.   Dorrene German, RN  08/08/2016

## 2016-08-08 NOTE — Progress Notes (Deleted)
Pre visit review using our clinic review tool, if applicable. No additional management support is needed unless otherwise documented below in the visit note. 

## 2016-08-08 NOTE — Patient Instructions (Addendum)
Bring a copy of your advanced directives to your next office visit. Schedule a nurse visit for your flu shot next week.  Schedule bone density scan w/ your mammogram.  Preventive Care for Adults, Female A healthy lifestyle and preventive care can promote health and wellness. Preventive health guidelines for women include the following key practices.  A routine yearly physical is a good way to check with your health care provider about your health and preventive screening. It is a chance to share any concerns and updates on your health and to receive a thorough exam.  Visit your dentist for a routine exam and preventive care every 6 months. Brush your teeth twice a day and floss once a day. Good oral hygiene prevents tooth decay and gum disease.  The frequency of eye exams is based on your age, health, family medical history, use of contact lenses, and other factors. Follow your health care provider's recommendations for frequency of eye exams.  Eat a healthy diet. Foods like vegetables, fruits, whole grains, low-fat dairy products, and lean protein foods contain the nutrients you need without too many calories. Decrease your intake of foods high in solid fats, added sugars, and salt. Eat the right amount of calories for you.Get information about a proper diet from your health care provider, if necessary.  Regular physical exercise is one of the most important things you can do for your health. Most adults should get at least 150 minutes of moderate-intensity exercise (any activity that increases your heart rate and causes you to sweat) each week. In addition, most adults need muscle-strengthening exercises on 2 or more days a week.  Maintain a healthy weight. The body mass index (BMI) is a screening tool to identify possible weight problems. It provides an estimate of body fat based on height and weight. Your health care provider can find your BMI and can help you achieve or maintain a healthy  weight.For adults 20 years and older:  A BMI below 18.5 is considered underweight.  A BMI of 18.5 to 24.9 is normal.  A BMI of 25 to 29.9 is considered overweight.  A BMI of 30 and above is considered obese.  Maintain normal blood lipids and cholesterol levels by exercising and minimizing your intake of saturated fat. Eat a balanced diet with plenty of fruit and vegetables. Blood tests for lipids and cholesterol should begin at age 60 and be repeated every 5 years. If your lipid or cholesterol levels are high, you are over 50, or you are at high risk for heart disease, you may need your cholesterol levels checked more frequently.Ongoing high lipid and cholesterol levels should be treated with medicines if diet and exercise are not working.  If you smoke, find out from your health care provider how to quit. If you do not use tobacco, do not start.  Lung cancer screening is recommended for adults aged 55-80 years who are at high risk for developing lung cancer because of a history of smoking. A yearly low-dose CT scan of the lungs is recommended for people who have at least a 30-pack-year history of smoking and are a current smoker or have quit within the past 15 years. A pack year of smoking is smoking an average of 1 pack of cigarettes a day for 1 year (for example: 1 pack a day for 30 years or 2 packs a day for 15 years). Yearly screening should continue until the smoker has stopped smoking for at least 15 years. Yearly screening  should be stopped for people who develop a health problem that would prevent them from having lung cancer treatment.  If you are pregnant, do not drink alcohol. If you are breastfeeding, be very cautious about drinking alcohol. If you are not pregnant and choose to drink alcohol, do not have more than 1 drink per day. One drink is considered to be 12 ounces (355 mL) of beer, 5 ounces (148 mL) of wine, or 1.5 ounces (44 mL) of liquor.  Avoid use of street drugs. Do not  share needles with anyone. Ask for help if you need support or instructions about stopping the use of drugs.  High blood pressure causes heart disease and increases the risk of stroke. Your blood pressure should be checked at least every 1 to 2 years. Ongoing high blood pressure should be treated with medicines if weight loss and exercise do not work.  If you are 68-7 years old, ask your health care provider if you should take aspirin to prevent strokes.  Diabetes screening is done by taking a blood sample to check your blood glucose level after you have not eaten for a certain period of time (fasting). If you are not overweight and you do not have risk factors for diabetes, you should be screened once every 3 years starting at age 31. If you are overweight or obese and you are 70-23 years of age, you should be screened for diabetes every year as part of your cardiovascular risk assessment.  Breast cancer screening is essential preventive care for women. You should practice "breast self-awareness." This means understanding the normal appearance and feel of your breasts and may include breast self-examination. Any changes detected, no matter how small, should be reported to a health care provider. Women in their 58s and 30s should have a clinical breast exam (CBE) by a health care provider as part of a regular health exam every 1 to 3 years. After age 77, women should have a CBE every year. Starting at age 31, women should consider having a mammogram (breast X-ray test) every year. Women who have a family history of breast cancer should talk to their health care provider about genetic screening. Women at a high risk of breast cancer should talk to their health care providers about having an MRI and a mammogram every year.  Breast cancer gene (BRCA)-related cancer risk assessment is recommended for women who have family members with BRCA-related cancers. BRCA-related cancers include breast, ovarian, tubal,  and peritoneal cancers. Having family members with these cancers may be associated with an increased risk for harmful changes (mutations) in the breast cancer genes BRCA1 and BRCA2. Results of the assessment will determine the need for genetic counseling and BRCA1 and BRCA2 testing.  Your health care provider may recommend that you be screened regularly for cancer of the pelvic organs (ovaries, uterus, and vagina). This screening involves a pelvic examination, including checking for microscopic changes to the surface of your cervix (Pap test). You may be encouraged to have this screening done every 3 years, beginning at age 41.  For women ages 67-65, health care providers may recommend pelvic exams and Pap testing every 3 years, or they may recommend the Pap and pelvic exam, combined with testing for human papilloma virus (HPV), every 5 years. Some types of HPV increase your risk of cervical cancer. Testing for HPV may also be done on women of any age with unclear Pap test results.  Other health care providers may not recommend any  screening for nonpregnant women who are considered low risk for pelvic cancer and who do not have symptoms. Ask your health care provider if a screening pelvic exam is right for you.  If you have had past treatment for cervical cancer or a condition that could lead to cancer, you need Pap tests and screening for cancer for at least 20 years after your treatment. If Pap tests have been discontinued, your risk factors (such as having a new sexual partner) need to be reassessed to determine if screening should resume. Some women have medical problems that increase the chance of getting cervical cancer. In these cases, your health care provider may recommend more frequent screening and Pap tests.  Colorectal cancer can be detected and often prevented. Most routine colorectal cancer screening begins at the age of 18 years and continues through age 20 years. However, your health care  provider may recommend screening at an earlier age if you have risk factors for colon cancer. On a yearly basis, your health care provider may provide home test kits to check for hidden blood in the stool. Use of a small camera at the end of a tube, to directly examine the colon (sigmoidoscopy or colonoscopy), can detect the earliest forms of colorectal cancer. Talk to your health care provider about this at age 79, when routine screening begins. Direct exam of the colon should be repeated every 5-10 years through age 22 years, unless early forms of precancerous polyps or small growths are found.  People who are at an increased risk for hepatitis B should be screened for this virus. You are considered at high risk for hepatitis B if:  You were born in a country where hepatitis B occurs often. Talk with your health care provider about which countries are considered high risk.  Your parents were born in a high-risk country and you have not received a shot to protect against hepatitis B (hepatitis B vaccine).  You have HIV or AIDS.  You use needles to inject street drugs.  You live with, or have sex with, someone who has hepatitis B.  You get hemodialysis treatment.  You take certain medicines for conditions like cancer, organ transplantation, and autoimmune conditions.  Hepatitis C blood testing is recommended for all people born from [birth year] through 1965 and any individual with known risks for hepatitis C.  Practice safe sex. Use condoms and avoid high-risk sexual practices to reduce the spread of sexually transmitted infections (STIs). STIs include gonorrhea, chlamydia, syphilis, trichomonas, herpes, HPV, and human immunodeficiency virus (HIV). Herpes, HIV, and HPV are viral illnesses that have no cure. They can result in disability, cancer, and death.  You should be screened for sexually transmitted illnesses (STIs) including gonorrhea and chlamydia if:  You are sexually active and are younger  than 24 years.  You are older than 24 years and your health care provider tells you that you are at risk for this type of infection.  Your sexual activity has changed since you were last screened and you are at an increased risk for chlamydia or gonorrhea. Ask your health care provider if you are at risk.  If you are at risk of being infected with HIV, it is recommended that you take a prescription medicine daily to prevent HIV infection. This is called preexposure prophylaxis (PrEP). You are considered at risk if:  You are sexually active and do not regularly use condoms or know the HIV status of your partner(s).  You take drugs by injection.  You are sexually active with a partner who has HIV.  Talk with your health care provider about whether you are at high risk of being infected with HIV. If you choose to begin PrEP, you should first be tested for HIV. You should then be tested every 3 months for as long as you are taking PrEP.  Osteoporosis is a disease in which the bones lose minerals and strength with aging. This can result in serious bone fractures or breaks. The risk of osteoporosis can be identified using a bone density scan. Women ages 68 years and over and women at risk for fractures or osteoporosis should discuss screening with their health care providers. Ask your health care provider whether you should take a calcium supplement or vitamin D to reduce the rate of osteoporosis.  Menopause can be associated with physical symptoms and risks. Hormone replacement therapy is available to decrease symptoms and risks. You should talk to your health care provider about whether hormone replacement therapy is right for you.  Use sunscreen. Apply sunscreen liberally and repeatedly throughout the day. You should seek shade when your shadow is shorter than you. Protect yourself by wearing long sleeves, pants, a wide-brimmed hat, and sunglasses year round, whenever you are outdoors.  Once a  month, do a whole body skin exam, using a mirror to look at the skin on your back. Tell your health care provider of new moles, moles that have irregular borders, moles that are larger than a pencil eraser, or moles that have changed in shape or color.  Stay current with required vaccines (immunizations).  Influenza vaccine. All adults should be immunized every year.  Tetanus, diphtheria, and acellular pertussis (Td, Tdap) vaccine. Pregnant women should receive 1 dose of Tdap vaccine during each pregnancy. The dose should be obtained regardless of the length of time since the last dose. Immunization is preferred during the 27th-36th week of gestation. An adult who has not previously received Tdap or who does not know her vaccine status should receive 1 dose of Tdap. This initial dose should be followed by tetanus and diphtheria toxoids (Td) booster doses every 10 years. Adults with an unknown or incomplete history of completing a 3-dose immunization series with Td-containing vaccines should begin or complete a primary immunization series including a Tdap dose. Adults should receive a Td booster every 10 years.  Varicella vaccine. An adult without evidence of immunity to varicella should receive 2 doses or a second dose if she has previously received 1 dose. Pregnant females who do not have evidence of immunity should receive the first dose after pregnancy. This first dose should be obtained before leaving the health care facility. The second dose should be obtained 4-8 weeks after the first dose.  Human papillomavirus (HPV) vaccine. Females aged 13-26 years who have not received the vaccine previously should obtain the 3-dose series. The vaccine is not recommended for use in pregnant females. However, pregnancy testing is not needed before receiving a dose. If a female is found to be pregnant after receiving a dose, no treatment is needed. In that case, the remaining doses should be delayed until after the  pregnancy. Immunization is recommended for any person with an immunocompromised condition through the age of 76 years if she did not get any or all doses earlier. During the 3-dose series, the second dose should be obtained 4-8 weeks after the first dose. The third dose should be obtained 24 weeks after the first dose and 16 weeks after the  second dose.  Zoster vaccine. One dose is recommended for adults aged 72 years or older unless certain conditions are present.  Measles, mumps, and rubella (MMR) vaccine. Adults born before [birth year] generally are considered immune to measles and mumps. Adults born in [birth year] or later should have 1 or more doses of MMR vaccine unless there is a contraindication to the vaccine or there is laboratory evidence of immunity to each of the three diseases. A routine second dose of MMR vaccine should be obtained at least 28 days after the first dose for students attending postsecondary schools, health care workers, or international travelers. People who received inactivated measles vaccine or an unknown type of measles vaccine during 1963-1967 should receive 2 doses of MMR vaccine. People who received inactivated mumps vaccine or an unknown type of mumps vaccine before 1979 and are at high risk for mumps infection should consider immunization with 2 doses of MMR vaccine. For females of childbearing age, rubella immunity should be determined. If there is no evidence of immunity, females who are not pregnant should be vaccinated. If there is no evidence of immunity, females who are pregnant should delay immunization until after pregnancy. Unvaccinated health care workers born before [birth year] who lack laboratory evidence of measles, mumps, or rubella immunity or laboratory confirmation of disease should consider measles and mumps immunization with 2 doses of MMR vaccine or rubella immunization with 1 dose of MMR vaccine.  Pneumococcal 13-valent conjugate (PCV13) vaccine. When indicated, a person  who is uncertain of his immunization history and has no record of immunization should receive the PCV13 vaccine. All adults 67 years of age and older should receive this vaccine. An adult aged 17 years or older who has certain medical conditions and has not been previously immunized should receive 1 dose of PCV13 vaccine. This PCV13 should be followed with a dose of pneumococcal polysaccharide (PPSV23) vaccine. Adults who are at high risk for pneumococcal disease should obtain the PPSV23 vaccine at least 8 weeks after the dose of PCV13 vaccine. Adults older than 68 years of age who have normal immune system function should obtain the PPSV23 vaccine dose at least 1 year after the dose of PCV13 vaccine.  Pneumococcal polysaccharide (PPSV23) vaccine. When PCV13 is also indicated, PCV13 should be obtained first. All adults aged 93 years and older should be immunized. An adult younger than age 39 years who has certain medical conditions should be immunized. Any person who resides in a nursing home or long-term care facility should be immunized. An adult smoker should be immunized. People with an immunocompromised condition and certain other conditions should receive both PCV13 and PPSV23 vaccines. People with human immunodeficiency virus (HIV) infection should be immunized as soon as possible after diagnosis. Immunization during chemotherapy or radiation therapy should be avoided. Routine use of PPSV23 vaccine is not recommended for American Indians, [place] Natives, or people younger than 65 years unless there are medical conditions that require PPSV23 vaccine. When indicated, people who have unknown immunization and have no record of immunization should receive PPSV23 vaccine. One-time revaccination 5 years after the first dose of PPSV23 is recommended for people aged 19-64 years who have chronic kidney failure, nephrotic syndrome, asplenia, or immunocompromised conditions. People who received 1-2 doses of PPSV23  before age 7 years should receive another dose of PPSV23 vaccine at age 66 years or later if at least 5 years have passed since the previous dose. Doses of PPSV23 are not needed for people immunized with PPSV23 at or  after age 5 years.  Meningococcal vaccine. Adults with asplenia or persistent complement component deficiencies should receive 2 doses of quadrivalent meningococcal conjugate (MenACWY-D) vaccine. The doses should be obtained at least 2 months apart. Microbiologists working with certain meningococcal bacteria, [employer] recruits, people at risk during an outbreak, and people who travel to or live in countries with a high rate of meningitis should be immunized. A first-year college student up through age 43 years who is living in a residence hall should receive a dose if she did not receive a dose on or after her 16th birthday. Adults who have certain high-risk conditions should receive one or more doses of vaccine.  Hepatitis A vaccine. Adults who wish to be protected from this disease, have certain high-risk conditions, work with hepatitis A-infected animals, work in hepatitis A research labs, or travel to or work in countries with a high rate of hepatitis A should be immunized. Adults who were previously unvaccinated and who anticipate close contact with an international adoptee during the first 60 days after arrival in the [place] from a country with a high rate of hepatitis A should be immunized.  Hepatitis B vaccine. Adults who wish to be protected from this disease, have certain high-risk conditions, may be exposed to blood or other infectious body fluids, are household contacts or sex partners of hepatitis B positive people, are clients or workers in certain care facilities, or travel to or work in countries with a high rate of hepatitis B should be immunized.  Haemophilus influenzae type b (Hib) vaccine. A previously unvaccinated person with asplenia or sickle cell disease or  having a scheduled splenectomy should receive 1 dose of Hib vaccine. Regardless of previous immunization, a recipient of a hematopoietic stem cell transplant should receive a 3-dose series 6-12 months after her successful transplant. Hib vaccine is not recommended for adults with HIV infection. Preventive Services / Frequency Ages 3 to 36 years  Blood pressure check.** / Every 3-5 years.  Lipid and cholesterol check.** / Every 5 years beginning at age 4.  Clinical breast exam.** / Every 3 years for women in their 39s and 70s.  BRCA-related cancer risk assessment.** / For women who have family members with a BRCA-related cancer (breast, ovarian, tubal, or peritoneal cancers).  Pap test.** / Every 2 years from ages 59 through 92. Every 3 years starting at age 5 through age 22 or 10 with a history of 3 consecutive normal Pap tests.  HPV screening.** / Every 3 years from ages 37 through ages 81 to 83 with a history of 3 consecutive normal Pap tests.  Hepatitis C blood test.** / For any individual with known risks for hepatitis C.  Skin self-exam. / Monthly.  Influenza vaccine. / Every year.  Tetanus, diphtheria, and acellular pertussis (Tdap, Td) vaccine.** / Consult your health care provider. Pregnant women should receive 1 dose of Tdap vaccine during each pregnancy. 1 dose of Td every 10 years.  Varicella vaccine.** / Consult your health care provider. Pregnant females who do not have evidence of immunity should receive the first dose after pregnancy.  HPV vaccine. / 3 doses over 6 months, if 76 and younger. The vaccine is not recommended for use in pregnant females. However, pregnancy testing is not needed before receiving a dose.  Measles, mumps, rubella (MMR) vaccine.** / You need at least 1 dose of MMR if you were born in 1957 or later. You may also need a 2nd dose. For females of childbearing age,  rubella immunity should be determined. If there is no evidence of immunity, females  who are not pregnant should be vaccinated. If there is no evidence of immunity, females who are pregnant should delay immunization until after pregnancy.  Pneumococcal 13-valent conjugate (PCV13) vaccine.** / Consult your health care provider.  Pneumococcal polysaccharide (PPSV23) vaccine.** / 1 to 2 doses if you smoke cigarettes or if you have certain conditions.  Meningococcal vaccine.** / 1 dose if you are age 17 to 23 years and a Market researcher living in a residence hall, or have one of several medical conditions, you need to get vaccinated against meningococcal disease. You may also need additional booster doses.  Hepatitis A vaccine.** / Consult your health care provider.  Hepatitis B vaccine.** / Consult your health care provider.  Haemophilus influenzae type b (Hib) vaccine.** / Consult your health care provider. Ages 46 to 27 years  Blood pressure check.** / Every year.  Lipid and cholesterol check.** / Every 5 years beginning at age 57 years.  Lung cancer screening. / Every year if you are aged 65-80 years and have a 30-pack-year history of smoking and currently smoke or have quit within the past 15 years. Yearly screening is stopped once you have quit smoking for at least 15 years or develop a health problem that would prevent you from having lung cancer treatment.  Clinical breast exam.** / Every year after age 58 years.  BRCA-related cancer risk assessment.** / For women who have family members with a BRCA-related cancer (breast, ovarian, tubal, or peritoneal cancers).  Mammogram.** / Every year beginning at age 50 years and continuing for as long as you are in good health. Consult with your health care provider.  Pap test.** / Every 3 years starting at age 43 years through age 28 or 80 years with a history of 3 consecutive normal Pap tests.  HPV screening.** / Every 3 years from ages 42 years through ages 28 to 70 years with a history of 3 consecutive normal  Pap tests.  Fecal occult blood test (FOBT) of stool. / Every year beginning at age 33 years and continuing until age 29 years. You may not need to do this test if you get a colonoscopy every 10 years.  Flexible sigmoidoscopy or colonoscopy.** / Every 5 years for a flexible sigmoidoscopy or every 10 years for a colonoscopy beginning at age 82 years and continuing until age 73 years.  Hepatitis C blood test.** / For all people born from [birth year] through 1963 and any individual with known risks for hepatitis C.  Skin self-exam. / Monthly.  Influenza vaccine. / Every year.  Tetanus, diphtheria, and acellular pertussis (Tdap/Td) vaccine.** / Consult your health care provider. Pregnant women should receive 1 dose of Tdap vaccine during each pregnancy. 1 dose of Td every 10 years.  Varicella vaccine.** / Consult your health care provider. Pregnant females who do not have evidence of immunity should receive the first dose after pregnancy.  Zoster vaccine.** / 1 dose for adults aged 58 years or older.  Measles, mumps, rubella (MMR) vaccine.** / You need at least 1 dose of MMR if you were born in 1957 or later. You may also need a second dose. For females of childbearing age, rubella immunity should be determined. If there is no evidence of immunity, females who are not pregnant should be vaccinated. If there is no evidence of immunity, females who are pregnant should delay immunization until after pregnancy.  Pneumococcal 13-valent conjugate (PCV13) vaccine.** /  Consult your health care provider.  Pneumococcal polysaccharide (PPSV23) vaccine.** / 1 to 2 doses if you smoke cigarettes or if you have certain conditions.  Meningococcal vaccine.** / Consult your health care provider.  Hepatitis A vaccine.** / Consult your health care provider.  Hepatitis B vaccine.** / Consult your health care provider.  Haemophilus influenzae type b (Hib) vaccine.** / Consult your health care provider. Ages 2 years  and over  Blood pressure check.** / Every year.  Lipid and cholesterol check.** / Every 5 years beginning at age 5 years.  Lung cancer screening. / Every year if you are aged 71-80 years and have a 30-pack-year history of smoking and currently smoke or have quit within the past 15 years. Yearly screening is stopped once you have quit smoking for at least 15 years or develop a health problem that would prevent you from having lung cancer treatment.  Clinical breast exam.** / Every year after age 35 years.  BRCA-related cancer risk assessment.** / For women who have family members with a BRCA-related cancer (breast, ovarian, tubal, or peritoneal cancers).  Mammogram.** / Every year beginning at age 51 years and continuing for as long as you are in good health. Consult with your health care provider.  Pap test.** / Every 3 years starting at age 48 years through age 5 or 52 years with 3 consecutive normal Pap tests. Testing can be stopped between 65 and 70 years with 3 consecutive normal Pap tests and no abnormal Pap or HPV tests in the past 10 years.  HPV screening.** / Every 3 years from ages 48 years through ages 100 or 29 years with a history of 3 consecutive normal Pap tests. Testing can be stopped between 65 and 70 years with 3 consecutive normal Pap tests and no abnormal Pap or HPV tests in the past 10 years.  Fecal occult blood test (FOBT) of stool. / Every year beginning at age 74 years and continuing until age 38 years. You may not need to do this test if you get a colonoscopy every 10 years.  Flexible sigmoidoscopy or colonoscopy.** / Every 5 years for a flexible sigmoidoscopy or every 10 years for a colonoscopy beginning at age 74 years and continuing until age 10 years.  Hepatitis C blood test.** / For all people born from [birth year] through 1963 and any individual with known risks for hepatitis C.  Osteoporosis screening.** / A one-time screening for women ages 1 years and over and  women at risk for fractures or osteoporosis.  Skin self-exam. / Monthly.  Influenza vaccine. / Every year.  Tetanus, diphtheria, and acellular pertussis (Tdap/Td) vaccine.** / 1 dose of Td every 10 years.  Varicella vaccine.** / Consult your health care provider.  Zoster vaccine.** / 1 dose for adults aged 58 years or older.  Pneumococcal 13-valent conjugate (PCV13) vaccine.** / Consult your health care provider.  Pneumococcal polysaccharide (PPSV23) vaccine.** / 1 dose for all adults aged 38 years and older.  Meningococcal vaccine.** / Consult your health care provider.  Hepatitis A vaccine.** / Consult your health care provider.  Hepatitis B vaccine.** / Consult your health care provider.  Haemophilus influenzae type b (Hib) vaccine.** / Consult your health care provider. ** Family history and personal history of risk and conditions may change your health care provider's recommendations.   This information is not intended to replace advice given to you by your health care provider. Make sure you discuss any questions you have with your health care provider.  Document Released: 11/15/2001 Document Revised: 10/10/2014 Document Reviewed: 02/14/2011 Elsevier Interactive Patient Education [employer].

## 2016-08-20 DIAGNOSIS — R69 Illness, unspecified: Secondary | ICD-10-CM | POA: Diagnosis not present

## 2016-09-01 DIAGNOSIS — H5203 Hypermetropia, bilateral: Secondary | ICD-10-CM | POA: Diagnosis not present

## 2016-09-01 DIAGNOSIS — Z0100 Encounter for examination of eyes and vision without abnormal findings: Secondary | ICD-10-CM | POA: Diagnosis not present

## 2016-09-02 DIAGNOSIS — Z6825 Body mass index (BMI) 25.0-25.9, adult: Secondary | ICD-10-CM | POA: Diagnosis not present

## 2016-09-02 DIAGNOSIS — Z90712 Acquired absence of cervix with remaining uterus: Secondary | ICD-10-CM | POA: Diagnosis not present

## 2016-09-02 DIAGNOSIS — Z1272 Encounter for screening for malignant neoplasm of vagina: Secondary | ICD-10-CM | POA: Diagnosis not present

## 2016-09-02 DIAGNOSIS — Z124 Encounter for screening for malignant neoplasm of cervix: Secondary | ICD-10-CM | POA: Diagnosis not present

## 2016-09-02 DIAGNOSIS — Z1231 Encounter for screening mammogram for malignant neoplasm of breast: Secondary | ICD-10-CM | POA: Diagnosis not present

## 2016-09-23 ENCOUNTER — Encounter: Payer: Medicare HMO | Admitting: Family Medicine

## 2016-09-30 ENCOUNTER — Encounter: Payer: Medicare HMO | Admitting: Family Medicine

## 2016-10-10 ENCOUNTER — Encounter: Payer: Medicare HMO | Admitting: Family Medicine

## 2016-10-25 DIAGNOSIS — H43813 Vitreous degeneration, bilateral: Secondary | ICD-10-CM | POA: Diagnosis not present

## 2016-10-25 DIAGNOSIS — H2513 Age-related nuclear cataract, bilateral: Secondary | ICD-10-CM | POA: Diagnosis not present

## 2016-10-25 DIAGNOSIS — H524 Presbyopia: Secondary | ICD-10-CM | POA: Diagnosis not present

## 2016-10-25 DIAGNOSIS — H2512 Age-related nuclear cataract, left eye: Secondary | ICD-10-CM | POA: Diagnosis not present

## 2016-10-25 DIAGNOSIS — H04123 Dry eye syndrome of bilateral lacrimal glands: Secondary | ICD-10-CM | POA: Diagnosis not present

## 2016-10-25 DIAGNOSIS — H40013 Open angle with borderline findings, low risk, bilateral: Secondary | ICD-10-CM | POA: Diagnosis not present

## 2016-11-07 DIAGNOSIS — Z85828 Personal history of other malignant neoplasm of skin: Secondary | ICD-10-CM | POA: Diagnosis not present

## 2016-11-07 DIAGNOSIS — L03032 Cellulitis of left toe: Secondary | ICD-10-CM | POA: Diagnosis not present

## 2016-11-07 DIAGNOSIS — L821 Other seborrheic keratosis: Secondary | ICD-10-CM | POA: Diagnosis not present

## 2016-11-07 DIAGNOSIS — L570 Actinic keratosis: Secondary | ICD-10-CM | POA: Diagnosis not present

## 2016-11-07 DIAGNOSIS — L03011 Cellulitis of right finger: Secondary | ICD-10-CM | POA: Diagnosis not present

## 2016-11-14 DIAGNOSIS — G43009 Migraine without aura, not intractable, without status migrainosus: Secondary | ICD-10-CM | POA: Diagnosis not present

## 2016-11-30 DIAGNOSIS — R69 Illness, unspecified: Secondary | ICD-10-CM | POA: Diagnosis not present

## 2016-12-05 DIAGNOSIS — H25812 Combined forms of age-related cataract, left eye: Secondary | ICD-10-CM | POA: Diagnosis not present

## 2016-12-05 DIAGNOSIS — H2512 Age-related nuclear cataract, left eye: Secondary | ICD-10-CM | POA: Diagnosis not present

## 2016-12-13 DIAGNOSIS — H2511 Age-related nuclear cataract, right eye: Secondary | ICD-10-CM | POA: Diagnosis not present

## 2016-12-26 DIAGNOSIS — H2511 Age-related nuclear cataract, right eye: Secondary | ICD-10-CM | POA: Diagnosis not present

## 2016-12-26 DIAGNOSIS — H25811 Combined forms of age-related cataract, right eye: Secondary | ICD-10-CM | POA: Diagnosis not present

## 2017-02-20 ENCOUNTER — Encounter: Payer: Self-pay | Admitting: Family Medicine

## 2017-02-20 ENCOUNTER — Other Ambulatory Visit: Payer: Self-pay | Admitting: Family Medicine

## 2017-02-20 NOTE — Telephone Encounter (Signed)
Is it just her immitrex she is getting from him ?  If yes that is ok

## 2017-03-03 HISTORY — PX: CATARACT EXTRACTION W/ INTRAOCULAR LENS IMPLANT: SHX1309

## 2017-03-03 HISTORY — PX: EYE SURGERY: SHX253

## 2017-05-09 DIAGNOSIS — L570 Actinic keratosis: Secondary | ICD-10-CM | POA: Diagnosis not present

## 2017-05-09 DIAGNOSIS — L821 Other seborrheic keratosis: Secondary | ICD-10-CM | POA: Diagnosis not present

## 2017-05-09 DIAGNOSIS — Z85828 Personal history of other malignant neoplasm of skin: Secondary | ICD-10-CM | POA: Diagnosis not present

## 2017-05-09 DIAGNOSIS — D225 Melanocytic nevi of trunk: Secondary | ICD-10-CM | POA: Diagnosis not present

## 2017-05-12 ENCOUNTER — Telehealth: Payer: Self-pay | Admitting: Family Medicine

## 2017-05-12 NOTE — Telephone Encounter (Signed)
Called the patient left message to call back 

## 2017-05-12 NOTE — Telephone Encounter (Signed)
I would recommend that she schedule a follow up visit for further evaluation prior to medication change please.

## 2017-05-12 NOTE — Telephone Encounter (Signed)
Advise on this problem with her medication.

## 2017-05-12 NOTE — Telephone Encounter (Signed)
Called pt to r/s AWV with Glenard Haring, lvm for pt to call office to schedule r/s appt.

## 2017-05-12 NOTE — Telephone Encounter (Signed)
Spoke with pt to r/s AWV. Pt had questions about her blood pressure medication (valsartarn). Pt stated that she is having a dry cough and her feet are cramping. She feels that it may be a reaction from her medication and would like to receive a call back to see if she can be prescribe a different medication.

## 2017-05-15 NOTE — Telephone Encounter (Signed)
Pt returned call   Please call back at : [phone]

## 2017-05-15 NOTE — Telephone Encounter (Signed)
Disregard my last note about leaving a message on pt's phone for his wife to receive B12 injections. LB

## 2017-05-15 NOTE — Telephone Encounter (Signed)
Left message on pt' vm to give the office a call back, in reference to wife receiving B-12 injections. LB

## 2017-05-16 NOTE — Telephone Encounter (Signed)
Called the patient back left a detailed message of instructions to schedule appt with PCP to discuss possible reaction to medication.

## 2017-05-22 ENCOUNTER — Encounter: Payer: Self-pay | Admitting: Family Medicine

## 2017-05-22 ENCOUNTER — Ambulatory Visit (INDEPENDENT_AMBULATORY_CARE_PROVIDER_SITE_OTHER): Payer: Medicare HMO | Admitting: Family Medicine

## 2017-05-22 VITALS — BP 124/82 | HR 69 | Resp 99 | Wt 148.0 lb

## 2017-05-22 DIAGNOSIS — I10 Essential (primary) hypertension: Secondary | ICD-10-CM | POA: Diagnosis not present

## 2017-05-22 DIAGNOSIS — G43809 Other migraine, not intractable, without status migrainosus: Secondary | ICD-10-CM | POA: Diagnosis not present

## 2017-05-22 MED ORDER — AMLODIPINE BESYLATE 5 MG PO TABS: 5.0000 mg | ORAL_TABLET | Freq: Every day | ORAL | 3 refills | Status: DC

## 2017-05-22 MED ORDER — SUMATRIPTAN SUCCINATE 100 MG PO TABS: 100.0000 mg | ORAL_TABLET | ORAL | 2 refills | Status: DC | PRN

## 2017-05-22 NOTE — Progress Notes (Signed)
Patient ID: Laura Hopkins, female    DOB: 1946/09/13  Age: 71 y.o. MRN: [id number]    Subjective:  Subjective  HPI Laura Hopkins presents for f/u bp and migraines and refills   No complaints   Review of Systems  Constitutional: Negative for activity change, appetite change, fatigue and unexpected weight change.  Respiratory: Negative for cough and shortness of breath.   Cardiovascular: Negative for chest pain and palpitations.  Psychiatric/Behavioral: Negative for behavioral problems and dysphoric mood. The patient is not nervous/anxious.     History Past Medical History:  Diagnosis Date  . Arthritis    hands and neck   . Headache(784.0)   . Hypertension   . Skin cancer    basal cell    She has a past surgical history that includes Abdominal hysterectomy (1995); Shoulder surgery (2000); Nasal septum surgery (1983); Colonoscopy (2007); Wisdom tooth extraction; and Tubal ligation (1978).   Her family history includes Diabetes in her father; Hypertension in her father and mother; Skin cancer in her brother.She reports that she quit smoking about 18 years ago. She smoked 0.30 packs per day. She has never used smokeless tobacco. She reports that she does not drink alcohol or use drugs.  Current Outpatient Prescriptions on File Prior to Visit  Medication Sig Dispense Refill  . ALPRAZolam (XANAX) 0.25 MG tablet Take 1 tablet by mouth 2 (two) times daily as needed.  1  . estradiol (VIVELLE-DOT) 0.05 MG/24HR Place 1 patch onto the skin once a week.      . Glucosamine-Chondroitin (OSTEO BI-FLEX REGULAR STRENGTH PO) Take 1 tablet by mouth daily.     . Probiotic Product (PROBIOTIC DAILY PO) Take 1 capsule by mouth daily. philips colon health    . temazepam (RESTORIL) 30 MG capsule Take 30 mg by mouth at bedtime as needed.     . valACYclovir (VALTREX) 1000 MG tablet Take 1 tablet (1,000 mg total) by mouth 3 (three) times daily. (Patient taking differently: Take 1,000 mg by mouth daily as  needed. ) 30 tablet 5   No current facility-administered medications on file prior to visit.      Objective:  Objective  Physical Exam  Constitutional: She is oriented to person, place, and time. She appears well-developed and well-nourished.  HENT:  Head: Normocephalic and atraumatic.  Eyes: Conjunctivae and EOM are normal.  Neck: Normal range of motion. Neck supple. No JVD present. Carotid bruit is not present. No thyromegaly present.  Cardiovascular: Normal rate, regular rhythm and normal heart sounds.   No murmur heard. Pulmonary/Chest: Effort normal and breath sounds normal. No respiratory distress. She has no wheezes. She has no rales. She exhibits no tenderness.  Musculoskeletal: She exhibits no edema.  Neurological: She is alert and oriented to person, place, and time.  Psychiatric: She has a normal mood and affect. Her behavior is normal. Judgment and thought content normal.  Nursing note and vitals reviewed.  BP 124/82   Pulse 69   Resp (!) 99   Wt 148 lb (67.1 kg)   BMI 25.40 kg/m  Wt Readings from Last 3 Encounters:  05/22/17 148 lb (67.1 kg)  08/08/16 147 lb 6.4 oz (66.9 kg)  04/12/16 148 lb (67.1 kg)     Lab Results  Component Value Date   WBC 7.6 08/08/2016   HGB 13.4 08/08/2016   HCT 39.9 08/08/2016   PLT 293.0 08/08/2016   GLUCOSE 81 05/22/2017   CHOL 160 05/22/2017   TRIG 176.0 (H) 05/22/2017   HDL  60.80 05/22/2017   LDLCALC 64 05/22/2017   ALT 5 05/22/2017   AST 19 05/22/2017   NA 137 05/22/2017   K 3.8 05/22/2017   CL 101 05/22/2017   CREATININE 0.81 05/22/2017   BUN 14 05/22/2017   CO2 33 (H) 05/22/2017   TSH 1.21 03/07/2011    Mr Cervical Spine Wo Contrast  Result Date: 10/11/2010 Clinical Data: Posterior neck with right shoulder and arm pain for 6 months.  No known injury or prior relevant surgery.  MRI CERVICAL SPINE WITHOUT CONTRAST  Technique:  Multiplanar and multiecho pulse sequences of the cervical spine, to include the  craniocervical junction and cervicothoracic junction, were obtained according to standard protocol without intravenous contrast.  Comparison: None.  Findings: The alignment is satisfactory.  There is no evidence of fracture or paraspinal ligamentous injury.  The craniocervical junction appears normal.  The cervical cord is normal in signal and caliber.  The spinal canal is adequately patent at all levels.  C2-C3:  There is moderate asymmetric facet hypertrophy on the left resulting in mild narrowing of the left foramen.  No nerve root encroachment is demonstrated.  C3-C4:  There are left greater than right facet degenerative changes with a resulting minimal anterolisthesis.  There is mild biforaminal stenosis without definite C4 nerve root encroachment.  C4-C5:  There is more symmetric facet hypertrophy with minimal disc bulging.  The right foramen is mildly narrowed.  There is no definite nerve root encroachment.  C5-C6:  Small central disc protrusion and mild bilateral facet hypertrophy.  No significant foraminal stenosis or nerve root encroachment.  C6-C7:  Disc height and hydration are maintained.  There is no spinal stenosis or nerve root encroachment.  C7-T1:  Facet degenerative changes are asymmetric to the left. There is no foraminal stenosis or nerve root encroachment.  IMPRESSION:  1.  Multilevel cervical facet arthropathy, generally worse on the left and most advanced at C2-C3 and C3-C4.  This facet disease may certainly contribute to neck pain. 2.  No high-grade foraminal stenosis or nerve root encroachment. 3.  Small central disc protrusion at C5-C6.  No large disc herniation or central stenosis. Provider: Reesa Chew  Mr Extrem Up Jt*r* W/o Cm  Result Date: 10/11/2010 Clinical Data: Neck pain radiating to the right shoulder and arm for 6 months.  No acute injury or prior relevant surgery.  MRI OF THE RIGHT SHOULDER WITHOUT CONTRAST  Technique:  Multiplanar, multisequence MR imaging of the  right shoulder was performed.  No intravenous contrast was administered.  Comparison:  None.  Findings:  There is no significant shoulder joint effusion.  A small amount of fluid is present anteriorly in the subacromial - subdeltoid bursa.  The anterior leading edge of the distal supraspinatus tendon is attenuated with partial tearing.  No definite full-thickness tendon tear, tendon retraction or muscular atrophy is identified.  The infraspinatus tendon demonstrates mild to moderate tendinosis, but no tear.  The subscapularis tendon demonstrates mild tendinosis, but no tear.  There is degenerative subchondral cyst formation inferiorly in the glenoid.  There is degeneration of the posterior labrum without well-defined tear.  The intra-articular portion of the biceps tendon is mildly degenerated, but intact.  The humeral head articular cartilage appears preserved.  The acromion is type 1.  There are moderate acromioclavicular degenerative changes with mild subacromial spurring.  IMPRESSION:  1.  Partial insertional tear of the supraspinatus tendon as described.  No definite full-thickness rotator cuff tear. 2.  Infraspinatus, subscapularis and biceps tendinosis  without tear. 3.  Glenohumeral degenerative changes with inferior glenoid subchondral cyst formation and posterior labral degeneration. Provider: [place]:  Plan  I have discontinued Ms. Suder cyanocobalamin, CYANOCOBALAMIN PO, and valsartan-hydrochlorothiazide. I have also changed her SUMAtriptan. Additionally, I am having her start on amLODipine. Lastly, I am having her maintain her estradiol, temazepam, Glucosamine-Chondroitin (OSTEO BI-FLEX REGULAR STRENGTH PO), ALPRAZolam, Probiotic Product (PROBIOTIC DAILY PO), and valACYclovir.  Meds ordered this encounter  Medications  . SUMAtriptan (IMITREX) 100 MG tablet    Sig: Take 1 tablet (100 mg total) by mouth as needed.    Dispense:  10 tablet    Refill:  2  .  amLODipine (NORVASC) 5 MG tablet    Sig: Take 1 tablet (5 mg total) by mouth daily.    Dispense:  90 tablet    Refill:  3    Problem List Items Addressed This Visit      Unprioritized   Essential hypertension    Well controlled, no changes to meds. Encouraged heart healthy diet such as the DASH diet and exercise as tolerated.       Relevant Medications   amLODipine (NORVASC) 5 MG tablet   Other Relevant Orders   Lipid panel (Completed)   Comprehensive metabolic panel (Completed)   POCT Urinalysis Dipstick (Automated)   Migraine - Primary    Stable Refill meds      Relevant Medications   SUMAtriptan (IMITREX) 100 MG tablet   amLODipine (NORVASC) 5 MG tablet   Other Relevant Orders   Lipid panel (Completed)   Comprehensive metabolic panel (Completed)   POCT Urinalysis Dipstick (Automated)      Follow-up: Return in about 6 months (around 11/22/2017).  Ann Held, DO

## 2017-05-22 NOTE — Patient Instructions (Signed)

## 2017-05-23 DIAGNOSIS — G43909 Migraine, unspecified, not intractable, without status migrainosus: Secondary | ICD-10-CM

## 2017-05-23 HISTORY — DX: Migraine, unspecified, not intractable, without status migrainosus: G43.909

## 2017-05-23 LAB — COMPREHENSIVE METABOLIC PANEL
ALBUMIN: 3.8 g/dL (ref 3.5–5.2)
ALT: 5 U/L (ref 0–35)
AST: 19 U/L (ref 0–37)
Alkaline Phosphatase: 57 U/L (ref 39–117)
BUN: 14 mg/dL (ref 6–23)
CALCIUM: 9.2 mg/dL (ref 8.4–10.5)
CHLORIDE: 101 meq/L (ref 96–112)
CO2: 33 meq/L — AB (ref 19–32)
Creatinine, Ser: 0.81 mg/dL (ref 0.40–1.20)
GFR: 74.58 mL/min (ref >60.00)
Glucose, Bld: 81 mg/dL (ref 70–99)
Potassium: 3.8 mEq/L (ref 3.5–5.1)
Sodium: 137 mEq/L (ref 135–145)
Total Bilirubin: 0.4 mg/dL (ref 0.2–1.2)
Total Protein: 6.7 g/dL (ref 6.0–8.3)

## 2017-05-23 LAB — LIPID PANEL
CHOL/HDL RATIO: 3
CHOLESTEROL: 160 mg/dL (ref 0–200)
HDL: 60.8 mg/dL (ref >39.00)
LDL CALC: 64 mg/dL (ref 0–99)
NonHDL: 99.32
TRIGLYCERIDES: 176 mg/dL — AB (ref 0.0–149.0)
VLDL: 35.2 mg/dL (ref 0.0–40.0)

## 2017-05-23 NOTE — Assessment & Plan Note (Signed)
Well controlled, no changes to meds. Encouraged heart healthy diet such as the DASH diet and exercise as tolerated.  °

## 2017-05-23 NOTE — Assessment & Plan Note (Signed)
Stable. Refill meds

## 2017-05-24 DIAGNOSIS — R69 Illness, unspecified: Secondary | ICD-10-CM | POA: Diagnosis not present

## 2017-07-19 ENCOUNTER — Encounter: Payer: Self-pay | Admitting: Family Medicine

## 2017-07-19 ENCOUNTER — Other Ambulatory Visit: Payer: Self-pay | Admitting: Family Medicine

## 2017-07-19 MED ORDER — NEBIVOLOL HCL 5 MG PO TABS: 5.0000 mg | ORAL_TABLET | Freq: Every day | ORAL | 2 refills | Status: DC

## 2017-07-19 NOTE — Telephone Encounter (Signed)
D/c norvasc Start bystolic 5 mg 1 po qd,  #17  2 refills bp check 2-3 weeks

## 2017-07-21 MED ORDER — METOPROLOL SUCCINATE ER 50 MG PO TB24: 50.0000 mg | ORAL_TABLET | Freq: Every day | ORAL | 2 refills | Status: DC

## 2017-07-21 NOTE — Telephone Encounter (Signed)
toprol xl 50 mg #30  1 po qd, 2 refills

## 2017-07-21 NOTE — Addendum Note (Signed)
Addended by: Damita Dunnings D on: 07/21/2017 12:35 PM   Modules accepted: Orders

## 2017-07-24 DIAGNOSIS — R69 Illness, unspecified: Secondary | ICD-10-CM | POA: Diagnosis not present

## 2017-07-31 ENCOUNTER — Encounter: Payer: Self-pay | Admitting: Family Medicine

## 2017-07-31 ENCOUNTER — Ambulatory Visit (INDEPENDENT_AMBULATORY_CARE_PROVIDER_SITE_OTHER): Payer: Medicare HMO | Admitting: Family Medicine

## 2017-07-31 VITALS — BP 137/82 | HR 62 | Temp 97.7°F | Resp 16 | Ht 64.0 in | Wt 149.0 lb

## 2017-07-31 DIAGNOSIS — R600 Localized edema: Secondary | ICD-10-CM | POA: Diagnosis not present

## 2017-07-31 DIAGNOSIS — I10 Essential (primary) hypertension: Secondary | ICD-10-CM

## 2017-07-31 HISTORY — DX: Localized edema: R60.0

## 2017-07-31 MED ORDER — CARVEDILOL 6.25 MG PO TABS: 6.2500 mg | ORAL_TABLET | Freq: Two times a day (BID) | ORAL | 3 refills | Status: DC

## 2017-07-31 MED ORDER — SPIRONOLACTONE 25 MG PO TABS: 25.0000 mg | ORAL_TABLET | Freq: Every day | ORAL | 2 refills | Status: DC

## 2017-07-31 NOTE — Assessment & Plan Note (Signed)
Poorly controlled will alter medications, encouraged DASH diet, minimize caffeine and obtain adequate sleep. Report concerning symptoms and follow up as directed and as needed 

## 2017-07-31 NOTE — Patient Instructions (Signed)

## 2017-07-31 NOTE — Progress Notes (Signed)
Patient ID: Laura Hopkins, female   DOB: August 07, 1946, 71 y.o.   MRN: [id number]     Subjective:  I acted as a Education administrator for Dr. Carollee Herter.  Guerry Bruin, [place]   Patient ID: Laura Hopkins, female    DOB: 10/19/1945, 71 y.o.   MRN: [id number]  Chief Complaint  Patient presents with  . Hypertension    HPI  Patient is in today for blood pressure.  Her blood pressure has been running high since she started on metoprolol.  She has felt tired and c/o small amount of swelling in low ext.  No sob, no cp  Patient Care Team: Carollee Herter, Alferd Apa, DO as PCP - General Dian Queen, MD as Consulting Physician (Obstetrics and Gynecology) Michel Santee, MD as Consulting Physician (Neurology) Martinique, Amy, MD as Consulting Physician (Dermatology)   Past Medical History:  Diagnosis Date  . Arthritis    hands and neck   . Headache(784.0)   . Hypertension   . Skin cancer    basal cell    Past Surgical History:  Procedure Laterality Date  . ABDOMINAL HYSTERECTOMY  1995  . COLONOSCOPY  2007   colon--negative  . NASAL SEPTUM SURGERY  1983  . SHOULDER SURGERY  2000  . TUBAL LIGATION  1978  . WISDOM TOOTH EXTRACTION      Family History  Problem Relation Age of Onset  . Diabetes Father   . Hypertension Father   . Skin cancer Brother        melanoma  . Hypertension Mother   . Colon cancer Neg Hx   . Colon polyps Neg Hx   . Esophageal cancer Neg Hx   . Rectal cancer Neg Hx   . Stomach cancer Neg Hx     Social History   Social History  . Marital status: Married    Spouse name: N/A  . Number of children: 2  . Years of education: N/A   Occupational History  . postal service Unemployed    retired   Social History Main Topics  . Smoking status: Former Smoker    Packs/day: 0.30    Quit date: 03/07/1999  . Smokeless tobacco: Never Used  . Alcohol use No  . Drug use: No  . Sexual activity: Yes    Partners: Male   Other Topics Concern  . Not on file   Social History Narrative  .  No narrative on file    Outpatient Medications Prior to Visit  Medication Sig Dispense Refill  . ALPRAZolam (XANAX) 0.25 MG tablet Take 1 tablet by mouth 2 (two) times daily as needed.  1  . estradiol (VIVELLE-DOT) 0.05 MG/24HR Place 1 patch onto the skin once a week.      . Glucosamine-Chondroitin (OSTEO BI-FLEX REGULAR STRENGTH PO) Take 1 tablet by mouth daily.     . Probiotic Product (PROBIOTIC DAILY PO) Take 1 capsule by mouth daily. philips colon health    . SUMAtriptan (IMITREX) 100 MG tablet Take 1 tablet (100 mg total) by mouth as needed. 10 tablet 2  . temazepam (RESTORIL) 30 MG capsule Take 30 mg by mouth at bedtime as needed.     . metoprolol succinate (TOPROL-XL) 50 MG 24 hr tablet Take 1 tablet (50 mg total) by mouth daily. Take with or immediately following a meal. 30 tablet 2   No facility-administered medications prior to visit.     Allergies  Allergen Reactions  . Codeine Other (See Comments)    Makes pt hyper  Review of Systems  Constitutional: Negative for chills, fever and malaise/fatigue.  HENT: Negative for congestion and hearing loss.   Eyes: Negative for discharge.  Respiratory: Negative for cough, sputum production and shortness of breath.   Cardiovascular: Negative for chest pain, palpitations and leg swelling.  Gastrointestinal: Negative for abdominal pain, blood in stool, constipation, diarrhea, heartburn, nausea and vomiting.  Genitourinary: Negative for dysuria, frequency, hematuria and urgency.  Musculoskeletal: Negative for back pain, falls and myalgias.  Skin: Negative for rash.  Neurological: Positive for dizziness and headaches. Negative for sensory change, loss of consciousness and weakness.  Endo/Heme/Allergies: Negative for environmental allergies. Does not bruise/bleed easily.  Psychiatric/Behavioral: Negative for depression and suicidal ideas. The patient is not nervous/anxious and does not have insomnia.        Objective:    Physical  Exam  Constitutional: She is oriented to person, place, and time. She appears well-developed and well-nourished.  HENT:  Head: Normocephalic and atraumatic.  Eyes: Conjunctivae and EOM are normal.  Neck: Normal range of motion. Neck supple. No JVD present. Carotid bruit is not present. No thyromegaly present.  Cardiovascular: Normal rate, regular rhythm and normal heart sounds.   No murmur heard. Pulmonary/Chest: Effort normal and breath sounds normal. No respiratory distress. She has no wheezes. She has no rales. She exhibits no tenderness.  Musculoskeletal: She exhibits edema.  Tr pitting edema b/l low ext  Neurological: She is alert and oriented to person, place, and time.  Psychiatric: She has a normal mood and affect. Her behavior is normal. Judgment and thought content normal.  Nursing note and vitals reviewed.   BP 137/82   Pulse 62   Temp 97.7 F (36.5 C) (Oral)   Resp 16   Ht 5\' 4"  (1.626 m)   Wt 149 lb (67.6 kg)   SpO2 96%   BMI 25.58 kg/m  Wt Readings from Last 3 Encounters:  07/31/17 149 lb (67.6 kg)  05/22/17 148 lb (67.1 kg)  08/08/16 147 lb 6.4 oz (66.9 kg)   BP Readings from Last 3 Encounters:  07/31/17 137/82  05/22/17 124/82  08/08/16 128/82     Immunization History  Administered Date(s) Administered  . Influenza Split 07/24/2017  . Influenza Whole 07/03/2006, 09/11/2007, 07/15/2008, 07/30/2009  . Influenza, High Dose Seasonal PF 08/27/2014, 08/06/2015  . Influenza,inj,Quad PF,6+ Mos 08/12/2013  . Pneumococcal Conjugate-13 10/02/2015  . Pneumococcal Polysaccharide-23 03/27/2014    Health Maintenance  Topic Date Due  . MAMMOGRAM  08/20/2016  . TETANUS/TDAP  03/07/2019  . COLONOSCOPY  04/12/2026  . INFLUENZA VACCINE  Completed  . DEXA SCAN  Completed  . Hepatitis C Screening  Completed  . PNA vac Low Risk Adult  Completed    Lab Results  Component Value Date   WBC 7.6 08/08/2016   HGB 13.4 08/08/2016   HCT 39.9 08/08/2016   PLT 293.0  08/08/2016   GLUCOSE 81 05/22/2017   CHOL 160 05/22/2017   TRIG 176.0 (H) 05/22/2017   HDL 60.80 05/22/2017   LDLCALC 64 05/22/2017   ALT 5 05/22/2017   AST 19 05/22/2017   NA 137 05/22/2017   K 3.8 05/22/2017   CL 101 05/22/2017   CREATININE 0.81 05/22/2017   BUN 14 05/22/2017   CO2 33 (H) 05/22/2017   TSH 1.21 03/07/2011    Lab Results  Component Value Date   TSH 1.21 03/07/2011   Lab Results  Component Value Date   WBC 7.6 08/08/2016   HGB 13.4 08/08/2016   HCT  39.9 08/08/2016   MCV 92.8 08/08/2016   PLT 293.0 08/08/2016   Lab Results  Component Value Date   NA 137 05/22/2017   K 3.8 05/22/2017   CO2 33 (H) 05/22/2017   GLUCOSE 81 05/22/2017   BUN 14 05/22/2017   CREATININE 0.81 05/22/2017   BILITOT 0.4 05/22/2017   ALKPHOS 57 05/22/2017   AST 19 05/22/2017   ALT 5 05/22/2017   PROT 6.7 05/22/2017   ALBUMIN 3.8 05/22/2017   CALCIUM 9.2 05/22/2017   GFR 74.58 05/22/2017   Lab Results  Component Value Date   CHOL 160 05/22/2017   Lab Results  Component Value Date   HDL 60.80 05/22/2017   Lab Results  Component Value Date   LDLCALC 64 05/22/2017   Lab Results  Component Value Date   TRIG 176.0 (H) 05/22/2017   Lab Results  Component Value Date   CHOLHDL 3 05/22/2017   No results found for: HGBA1C       Assessment & Plan:   Problem List Items Addressed This Visit      Unprioritized   Essential hypertension - Primary    Poorly controlled will alter medications, encouraged DASH diet, minimize caffeine and obtain adequate sleep. Report concerning symptoms and follow up as directed and as needed      Relevant Medications   carvedilol (COREG) 6.25 MG tablet   spironolactone (ALDACTONE) 25 MG tablet   Lower extremity edema    Elevate legs Compression socks Spironolactone daily       Relevant Medications   spironolactone (ALDACTONE) 25 MG tablet      I have discontinued Ms. Vanacker metoprolol succinate. I am also having her start  on carvedilol and spironolactone. Additionally, I am having her maintain her estradiol, temazepam, Glucosamine-Chondroitin (OSTEO BI-FLEX REGULAR STRENGTH PO), ALPRAZolam, Probiotic Product (PROBIOTIC DAILY PO), SUMAtriptan, Fish Oil, and Flax Seed Oil.  Meds ordered this encounter  Medications  . Omega-3 Fatty Acids (FISH OIL) 1200 MG CAPS    Sig: Take 1 capsule by mouth daily.  . Flaxseed, Linseed, (FLAX SEED OIL) 1000 MG CAPS    Sig: Take 1 capsule by mouth daily.  . carvedilol (COREG) 6.25 MG tablet    Sig: Take 1 tablet (6.25 mg total) by mouth 2 (two) times daily with a meal.    Dispense:  60 tablet    Refill:  3  . spironolactone (ALDACTONE) 25 MG tablet    Sig: Take 1 tablet (25 mg total) by mouth daily.    Dispense:  30 tablet    Refill:  2    CMA served as scribe during this visit. History, Physical and Plan performed by medical provider. Documentation and orders reviewed and attested to.  Ann Held, DO

## 2017-07-31 NOTE — Assessment & Plan Note (Signed)
Elevate legs Compression socks Spironolactone daily

## 2017-08-10 ENCOUNTER — Encounter: Payer: Medicare HMO | Admitting: Family Medicine

## 2017-08-11 ENCOUNTER — Ambulatory Visit: Payer: Medicare HMO | Admitting: *Deleted

## 2017-08-11 ENCOUNTER — Encounter: Payer: Medicare HMO | Admitting: Family Medicine

## 2017-08-11 NOTE — Progress Notes (Signed)
Subjective:   Laura Hopkins is a 71 y.o. female who presents for Medicare Annual (Subsequent) preventive examination.  Review of Systems: Cardiac Risk Factors include: advanced age (>90men, >21 women);hypertension No ROS.  Medicare Wellness Visit. Additional risk factors are reflected in the social history.  Sleep patterns: Pt reports continued issues with sleep. States she can't sleep more than 4 hrs.   Female:   Pap-  Last reported 08/2016-normal     Mammo- Pt states she does these through [facility]. Last reported 08/2016-normal.      Dexa scan- last reported 08/2015-normal       CCS- last 04/12/16: recall 10 yrs  Objective:     Vitals: BP 124/80 (BP Location: Left Arm, Patient Position: Sitting, Cuff Size: Normal)   Pulse (!) 58   Ht 5\' 4"  (1.626 m)   Wt 147 lb 12.8 oz (67 kg)   SpO2 97%   BMI 25.37 kg/m   Body mass index is 25.37 kg/m.   Tobacco Social History   Tobacco Use  Smoking Status Former Smoker  . Packs/day: 0.30  . Last attempt to quit: 03/07/1999  . Years since quitting: 18.4  Smokeless Tobacco Never Used     Counseling given: Not Answered   Past Medical History:  Diagnosis Date  . Arthritis    hands and neck   . Headache(784.0)   . Hypertension   . Skin cancer    basal cell   Past Surgical History:  Procedure Laterality Date  . ABDOMINAL HYSTERECTOMY  1995  . COLONOSCOPY  2007   colon--negative  . EYE SURGERY Bilateral 03/03/2017   cataract sx with lens implant  . NASAL SEPTUM SURGERY  1983  . SHOULDER SURGERY  2000  . TUBAL LIGATION  1978  . WISDOM TOOTH EXTRACTION     Family History  Problem Relation Age of Onset  . Diabetes Father   . Hypertension Father   . Skin cancer Brother        melanoma  . Hypertension Mother   . Colon cancer Neg Hx   . Colon polyps Neg Hx   . Esophageal cancer Neg Hx   . Rectal cancer Neg Hx   . Stomach cancer Neg Hx    Social History   Substance and Sexual Activity  Sexual Activity No  .  Partners: Male    Outpatient Encounter Medications as of 08/14/2017  Medication Sig  . carvedilol (COREG) 6.25 MG tablet Take 1 tablet (6.25 mg total) by mouth 2 (two) times daily with a meal.  . estradiol (VIVELLE-DOT) 0.05 MG/24HR Place 1 patch onto the skin once a week.    . Flaxseed, Linseed, (FLAX SEED OIL) 1000 MG CAPS Take 1 capsule by mouth daily.  . Glucosamine-Chondroitin (OSTEO BI-FLEX REGULAR STRENGTH PO) Take 1 tablet by mouth daily.   . Omega-3 Fatty Acids (FISH OIL) 1200 MG CAPS Take 1 capsule by mouth daily.  . Probiotic Product (PROBIOTIC DAILY PO) Take 1 capsule by mouth daily. philips colon health  . spironolactone (ALDACTONE) 25 MG tablet Take 1 tablet (25 mg total) by mouth daily.  . SUMAtriptan (IMITREX) 100 MG tablet Take 1 tablet (100 mg total) by mouth as needed.  . ALPRAZolam (XANAX) 0.25 MG tablet Take 1 tablet by mouth 2 (two) times daily as needed.  . temazepam (RESTORIL) 30 MG capsule Take 30 mg by mouth at bedtime as needed.    No facility-administered encounter medications on file as of 08/14/2017.     Activities of  Daily Living In your present state of health, do you have any difficulty performing the following activities: 08/14/2017  Hearing? N  Vision? N  Comment hx cataract sx 2018. Dr.Digby yearly.   Difficulty concentrating or making decisions? N  Walking or climbing stairs? N  Dressing or bathing? N  Doing errands, shopping? N  Preparing Food and eating ? N  Using the Toilet? N  In the past six months, have you accidently leaked urine? Y  Comment wearing pads  Do you have problems with loss of bowel control? N  Managing your Medications? N  Managing your Finances? N  Housekeeping or managing your Housekeeping? N  Some recent data might be hidden    Patient Care Team: Carollee Herter, Alferd Apa, DO as PCP - General Dian Queen, MD as Consulting Physician (Obstetrics and Gynecology) Michel Santee, MD as Consulting Physician  (Neurology) [place], Amy, MD as Consulting Physician (Dermatology)    Assessment:    Physical assessment deferred to PCP.  Exercise Activities and Dietary recommendations Current Exercise Habits: Home exercise routine, Type of exercise: walking, Time (Minutes): 30, Frequency (Times/Week): 3, Weekly Exercise (Minutes/Week): 90, Intensity: Mild Diet (meal preparation, eat out, water intake, caffeinated beverages, dairy products, fruits and vegetables): in general, a "healthy" diet     Goals    . Eat more fruits and vegetables    . Increase physical activity     Water fitness Yoga Walking       Fall Risk Fall Risk  08/14/2017 08/08/2016 08/06/2015 03/27/2014  Falls in the past year? No No No No   Depression Screen PHQ 2/9 Scores 08/14/2017 08/08/2016 08/06/2015 03/27/2014  PHQ - 2 Score 0 0 0 0     Cognitive Function MMSE - Mini Mental State Exam 08/14/2017 08/08/2016 08/06/2015  Orientation to time 5 5 5   Orientation to Place 5 5 5   Registration 3 3 3   Attention/ Calculation 5 5 5   Recall 3 3 3   Language- name 2 objects 2 2 2   Language- repeat 1 1 1   Language- follow 3 step command 3 3 3   Language- read & follow direction 1 1 1   Write a sentence 1 1 1   Copy design 1 1 1   Total score 30 30 30         Immunization History  Administered Date(s) Administered  . Influenza Split 07/24/2017  . Influenza Whole 07/03/2006, 09/11/2007, 07/15/2008, 07/30/2009  . Influenza, High Dose Seasonal PF 08/27/2014, 08/06/2015  . Influenza,inj,Quad PF,6+ Mos 08/12/2013  . Pneumococcal Conjugate-13 10/02/2015  . Pneumococcal Polysaccharide-23 03/27/2014   Screening Tests Health Maintenance  Topic Date Due  . MAMMOGRAM  08/20/2016  . TETANUS/TDAP  03/07/2019  . COLONOSCOPY  04/12/2026  . INFLUENZA VACCINE  Completed  . DEXA SCAN  Completed  . Hepatitis C Screening  Completed  . PNA vac Low Risk Adult  Completed      Plan:   Follow up with PCP today as scheduled.  Continue to eat  heart healthy diet (full of fruits, vegetables, whole grains, lean protein, water--limit salt, fat, and sugar intake) and increase physical activity as tolerated.  Continue doing brain stimulating activities (puzzles, reading, adult coloring books, staying active) to keep memory sharp.   Records release faxed to [place] office for mammogram, dexa, and pap reports for continuity of care.  I have personally reviewed and noted the following in the patient's chart:   . Medical and social history . Use of alcohol, tobacco or illicit drugs  . Current  medications and supplements . Functional ability and status . Nutritional status . Physical activity . Advanced directives . List of other physicians . Hospitalizations, surgeries, and ER visits in previous 12 months . Vitals . Screenings to include cognitive, depression, and falls . Referrals and appointments  In addition, I have reviewed and discussed with patient certain preventive protocols, quality metrics, and best practice recommendations. A written personalized care plan for preventive services as well as general preventive health recommendations were provided to patient.     Shela Nevin, [place]  08/14/2017

## 2017-08-14 ENCOUNTER — Encounter: Payer: Self-pay | Admitting: Family Medicine

## 2017-08-14 ENCOUNTER — Ambulatory Visit (INDEPENDENT_AMBULATORY_CARE_PROVIDER_SITE_OTHER): Payer: Medicare HMO | Admitting: Family Medicine

## 2017-08-14 ENCOUNTER — Ambulatory Visit: Payer: Medicare HMO | Admitting: *Deleted

## 2017-08-14 VITALS — BP 124/80 | HR 58 | Ht 64.0 in | Wt 147.8 lb

## 2017-08-14 DIAGNOSIS — R1310 Dysphagia, unspecified: Secondary | ICD-10-CM | POA: Diagnosis not present

## 2017-08-14 DIAGNOSIS — Z0000 Encounter for general adult medical examination without abnormal findings: Secondary | ICD-10-CM | POA: Diagnosis not present

## 2017-08-14 DIAGNOSIS — R0982 Postnasal drip: Secondary | ICD-10-CM | POA: Diagnosis not present

## 2017-08-14 DIAGNOSIS — I10 Essential (primary) hypertension: Secondary | ICD-10-CM

## 2017-08-14 LAB — CBC WITH DIFFERENTIAL/PLATELET
BASOS PCT: 0.9 % (ref 0.0–3.0)
Basophils Absolute: 0.1 10*3/uL (ref 0.0–0.1)
EOS ABS: 0.6 10*3/uL (ref 0.0–0.7)
EOS PCT: 8.3 % — AB (ref 0.0–5.0)
HCT: 44.1 % (ref 36.0–46.0)
Hemoglobin: 14.3 g/dL (ref 12.0–15.0)
LYMPHS ABS: 1.9 10*3/uL (ref 0.7–4.0)
Lymphocytes Relative: 24.9 % (ref 12.0–46.0)
MCHC: 32.3 g/dL (ref 30.0–36.0)
MCV: 97.2 fl (ref 78.0–100.0)
MONO ABS: 0.6 10*3/uL (ref 0.1–1.0)
Monocytes Relative: 7.4 % (ref 3.0–12.0)
NEUTROS ABS: 4.5 10*3/uL (ref 1.4–7.7)
Neutrophils Relative %: 58.5 % (ref 43.0–77.0)
PLATELETS: 251 10*3/uL (ref 150.0–400.0)
RBC: 4.54 Mil/uL (ref 3.87–5.11)
RDW: 13.6 % (ref 11.5–15.5)
WBC: 7.7 10*3/uL (ref 4.0–10.5)

## 2017-08-14 LAB — COMPREHENSIVE METABOLIC PANEL
ALBUMIN: 4 g/dL (ref 3.5–5.2)
ALT: 7 U/L (ref 0–35)
AST: 21 U/L (ref 0–37)
Alkaline Phosphatase: 59 U/L (ref 39–117)
BUN: 17 mg/dL (ref 6–23)
CHLORIDE: 104 meq/L (ref 96–112)
CO2: 28 meq/L (ref 19–32)
Calcium: 9.9 mg/dL (ref 8.4–10.5)
Creatinine, Ser: 0.9 mg/dL (ref 0.40–1.20)
GFR: 66 mL/min (ref >60.00)
Glucose, Bld: 94 mg/dL (ref 70–99)
POTASSIUM: 5.2 meq/L — AB (ref 3.5–5.1)
SODIUM: 139 meq/L (ref 135–145)
Total Bilirubin: 0.7 mg/dL (ref 0.2–1.2)
Total Protein: 6.5 g/dL (ref 6.0–8.3)

## 2017-08-14 LAB — LIPID PANEL
CHOL/HDL RATIO: 3
CHOLESTEROL: 188 mg/dL (ref 0–200)
HDL: 62.2 mg/dL (ref >39.00)
LDL CALC: 107 mg/dL — AB (ref 0–99)
NonHDL: 126.06
Triglycerides: 97 mg/dL (ref 0.0–149.0)
VLDL: 19.4 mg/dL (ref 0.0–40.0)

## 2017-08-14 MED ORDER — RANITIDINE HCL 150 MG PO TABS: 150.0000 mg | ORAL_TABLET | Freq: Two times a day (BID) | ORAL | 2 refills | Status: DC

## 2017-08-14 MED ORDER — LORATADINE 10 MG PO TABS: 10.0000 mg | ORAL_TABLET | Freq: Every day | ORAL | 11 refills | Status: DC

## 2017-08-14 MED ORDER — ZOSTER VAC RECOMB ADJUVANTED 50 MCG/0.5ML IM SUSR: 0.5000 mL | Freq: Once | INTRAMUSCULAR | 1 refills | Status: AC

## 2017-08-14 MED ORDER — OLMESARTAN MEDOXOMIL 20 MG PO TABS: 20.0000 mg | ORAL_TABLET | Freq: Every day | ORAL | 2 refills | Status: DC

## 2017-08-14 NOTE — Assessment & Plan Note (Signed)
Well controlled, no changes to meds. Encouraged heart healthy diet such as the DASH diet and exercise as tolerated.  °

## 2017-08-14 NOTE — Patient Instructions (Signed)
Laura Hopkins , Thank you for taking time to come for your Medicare Wellness Visit. I appreciate your ongoing commitment to your health goals. Please review the following plan we discussed and let me know if I can assist you in the future.   These are the goals we discussed: Goals    . Eat more fruits and vegetables    . Increase physical activity     Water fitness Yoga Walking        This is a list of the screening recommended for you and due dates:  Health Maintenance  Topic Date Due  . Mammogram  08/20/2016  . Tetanus Vaccine  03/07/2019  . Colon Cancer Screening  04/12/2026  . Flu Shot  Completed  . DEXA scan (bone density measurement)  Completed  .  Hepatitis C: One time screening is recommended by Center for Disease Control  (CDC) for  adults born from [birth year] through 1963.   Completed  . Pneumonia vaccines  Completed    Health Maintenance for Postmenopausal Women Menopause is a normal process in which your reproductive ability comes to an end. This process happens gradually over a span of months to years, usually between the ages of 39 and 106. Menopause is complete when you have missed 12 consecutive menstrual periods. It is important to talk with your health care provider about some of the most common conditions that affect postmenopausal women, such as heart disease, cancer, and bone loss (osteoporosis). Adopting a healthy lifestyle and getting preventive care can help to promote your health and wellness. Those actions can also lower your chances of developing some of these common conditions. What should I know about menopause? During menopause, you may experience a number of symptoms, such as:  Moderate-to-severe hot flashes.  Night sweats.  Decrease in sex drive.  Mood swings.  Headaches.  Tiredness.  Irritability.  Memory problems.  Insomnia.  Choosing to treat or not to treat menopausal changes is an individual decision that you make with your health care  provider. What should I know about hormone replacement therapy and supplements? Hormone therapy products are effective for treating symptoms that are associated with menopause, such as hot flashes and night sweats. Hormone replacement carries certain risks, especially as you become older. If you are thinking about using estrogen or estrogen with progestin treatments, discuss the benefits and risks with your health care provider. What should I know about heart disease and stroke? Heart disease, heart attack, and stroke become more likely as you age. This may be due, in part, to the hormonal changes that your body experiences during menopause. These can affect how your body processes dietary fats, triglycerides, and cholesterol. Heart attack and stroke are both medical emergencies. There are many things that you can do to help prevent heart disease and stroke:  Have your blood pressure checked at least every 1-2 years. High blood pressure causes heart disease and increases the risk of stroke.  If you are 91-61 years old, ask your health care provider if you should take aspirin to prevent a heart attack or a stroke.  Do not use any tobacco products, including cigarettes, chewing tobacco, or electronic cigarettes. If you need help quitting, ask your health care provider.  It is important to eat a healthy diet and maintain a healthy weight. ? Be sure to include plenty of vegetables, fruits, low-fat dairy products, and lean protein. ? Avoid eating foods that are high in solid fats, added sugars, or salt (sodium).  Get  regular exercise. This is one of the most important things that you can do for your health. ? Try to exercise for at least 150 minutes each week. The type of exercise that you do should increase your heart rate and make you sweat. This is known as moderate-intensity exercise. ? Try to do strengthening exercises at least twice each week. Do these in addition to the moderate-intensity  exercise.  Know your numbers.Ask your health care provider to check your cholesterol and your blood glucose. Continue to have your blood tested as directed by your health care provider.  What should I know about cancer screening? There are several types of cancer. Take the following steps to reduce your risk and to catch any cancer development as early as possible. Breast Cancer  Practice breast self-awareness. ? This means understanding how your breasts normally appear and feel. ? It also means doing regular breast self-exams. Let your health care provider know about any changes, no matter how small.  If you are 55 or older, have a clinician do a breast exam (clinical breast exam or CBE) every year. Depending on your age, family history, and medical history, it may be recommended that you also have a yearly breast X-ray (mammogram).  If you have a family history of breast cancer, talk with your health care provider about genetic screening.  If you are at high risk for breast cancer, talk with your health care provider about having an MRI and a mammogram every year.  Breast cancer (BRCA) gene test is recommended for women who have family members with BRCA-related cancers. Results of the assessment will determine the need for genetic counseling and BRCA1 and for BRCA2 testing. BRCA-related cancers include these types: ? Breast. This occurs in males or females. ? Ovarian. ? Tubal. This may also be called fallopian tube cancer. ? Cancer of the abdominal or pelvic lining (peritoneal cancer). ? Prostate. ? Pancreatic.  Cervical, Uterine, and Ovarian Cancer Your health care provider may recommend that you be screened regularly for cancer of the pelvic organs. These include your ovaries, uterus, and vagina. This screening involves a pelvic exam, which includes checking for microscopic changes to the surface of your cervix (Pap test).  For women ages 21-65, health care providers may recommend a  pelvic exam and a Pap test every three years. For women ages 62-65, they may recommend the Pap test and pelvic exam, combined with testing for human papilloma virus (HPV), every five years. Some types of HPV increase your risk of cervical cancer. Testing for HPV may also be done on women of any age who have unclear Pap test results.  Other health care providers may not recommend any screening for nonpregnant women who are considered low risk for pelvic cancer and have no symptoms. Ask your health care provider if a screening pelvic exam is right for you.  If you have had past treatment for cervical cancer or a condition that could lead to cancer, you need Pap tests and screening for cancer for at least 20 years after your treatment. If Pap tests have been discontinued for you, your risk factors (such as having a new sexual partner) need to be reassessed to determine if you should start having screenings again. Some women have medical problems that increase the chance of getting cervical cancer. In these cases, your health care provider may recommend that you have screening and Pap tests more often.  If you have a family history of uterine cancer or ovarian cancer,  talk with your health care provider about genetic screening.  If you have vaginal bleeding after reaching menopause, tell your health care provider.  There are currently no reliable tests available to screen for ovarian cancer.  Lung Cancer Lung cancer screening is recommended for adults 37-60 years old who are at high risk for lung cancer because of a history of smoking. A yearly low-dose CT scan of the lungs is recommended if you:  Currently smoke.  Have a history of at least 30 pack-years of smoking and you currently smoke or have quit within the past 15 years. A pack-year is smoking an average of one pack of cigarettes per day for one year.  Yearly screening should:  Continue until it has been 15 years since you quit.  Stop if  you develop a health problem that would prevent you from having lung cancer treatment.  Colorectal Cancer  This type of cancer can be detected and can often be prevented.  Routine colorectal cancer screening usually begins at age 89 and continues through age 64.  If you have risk factors for colon cancer, your health care provider may recommend that you be screened at an earlier age.  If you have a family history of colorectal cancer, talk with your health care provider about genetic screening.  Your health care provider may also recommend using home test kits to check for hidden blood in your stool.  A small camera at the end of a tube can be used to examine your colon directly (sigmoidoscopy or colonoscopy). This is done to check for the earliest forms of colorectal cancer.  Direct examination of the colon should be repeated every 5-10 years until age 62. However, if early forms of precancerous polyps or small growths are found or if you have a family history or genetic risk for colorectal cancer, you may need to be screened more often.  Skin Cancer  Check your skin from head to toe regularly.  Monitor any moles. Be sure to tell your health care provider: ? About any new moles or changes in moles, especially if there is a change in a mole's shape or color. ? If you have a mole that is larger than the size of a pencil eraser.  If any of your family members has a history of skin cancer, especially at a young age, talk with your health care provider about genetic screening.  Always use sunscreen. Apply sunscreen liberally and repeatedly throughout the day.  Whenever you are outside, protect yourself by wearing long sleeves, pants, a wide-brimmed hat, and sunglasses.  What should I know about osteoporosis? Osteoporosis is a condition in which bone destruction happens more quickly than new bone creation. After menopause, you may be at an increased risk for osteoporosis. To help prevent  osteoporosis or the bone fractures that can happen because of osteoporosis, the following is recommended:  If you are 25-71 years old, get at least 1,000 mg of calcium and at least 600 mg of vitamin D per day.  If you are older than age 100 but younger than age 92, get at least 1,200 mg of calcium and at least 600 mg of vitamin D per day.  If you are older than age 32, get at least 1,200 mg of calcium and at least 800 mg of vitamin D per day.  Smoking and excessive alcohol intake increase the risk of osteoporosis. Eat foods that are rich in calcium and vitamin D, and do weight-bearing exercises several times each  week as directed by your health care provider. What should I know about how menopause affects my mental health? Depression may occur at any age, but it is more common as you become older. Common symptoms of depression include:  Low or sad mood.  Changes in sleep patterns.  Changes in appetite or eating patterns.  Feeling an overall lack of motivation or enjoyment of activities that you previously enjoyed.  Frequent crying spells.  Talk with your health care provider if you think that you are experiencing depression. What should I know about immunizations? It is important that you get and maintain your immunizations. These include:  Tetanus, diphtheria, and pertussis (Tdap) booster vaccine.  Influenza every year before the flu season begins.  Pneumonia vaccine.  Shingles vaccine.  Your health care provider may also recommend other immunizations. This information is not intended to replace advice given to you by your health care provider. Make sure you discuss any questions you have with your health care provider. Document Released: 11/11/2005 Document Revised: 04/08/2016 Document Reviewed: 06/23/2015 Elsevier Interactive Patient Education  2018 [employer].

## 2017-08-14 NOTE — Progress Notes (Signed)
Patient ID: Laura Hopkins, female    DOB: [birth year]  Age: 71 y.o. MRN: [id number]    Subjective:  Subjective  HPI Laura Hopkins presents for f/u and medicare wellness.  Pt c/o cough, drainage and metallic taste  Review of Systems  Constitutional: Negative for appetite change, diaphoresis, fatigue and unexpected weight change.  Eyes: Negative for pain, redness and visual disturbance.  Respiratory: Negative for cough, chest tightness, shortness of breath and wheezing.   Cardiovascular: Negative for chest pain, palpitations and leg swelling.  Endocrine: Negative for cold intolerance, heat intolerance, polydipsia, polyphagia and polyuria.  Genitourinary: Negative for difficulty urinating, dysuria and frequency.  Neurological: Negative for dizziness, light-headedness, numbness and headaches.    History Past Medical History:  Diagnosis Date  . Arthritis    hands and neck   . Headache(784.0)   . Hypertension   . Skin cancer    basal cell    She has a past surgical history that includes Abdominal hysterectomy (1995); Shoulder surgery (2000); Nasal septum surgery (1983); Colonoscopy (2007); Wisdom tooth extraction; Tubal ligation (1978); and Eye surgery (Bilateral, 03/03/2017).   Her family history includes Diabetes in her father; Hypertension in her father and mother; Skin cancer in her brother.She reports that she quit smoking about 18 years ago. She smoked 0.30 packs per day. she has never used smokeless tobacco. She reports that she does not drink alcohol or use drugs.  Current Outpatient Medications on File Prior to Visit  Medication Sig Dispense Refill  . estradiol (VIVELLE-DOT) 0.05 MG/24HR Place 1 patch onto the skin once a week.      . Flaxseed, Linseed, (FLAX SEED OIL) 1000 MG CAPS Take 1 capsule by mouth daily.    . Glucosamine-Chondroitin (OSTEO BI-FLEX REGULAR STRENGTH PO) Take 1 tablet by mouth daily.     . Melatonin 3 MG TABS Take at bedtime as needed by mouth.    .  Multiple Vitamins-Minerals (MULTIPLE VITAMINS/WOMENS PO) Take by mouth.    . Omega-3 Fatty Acids (FISH OIL) 1200 MG CAPS Take 1 capsule by mouth daily.    . Probiotic Product (PROBIOTIC DAILY PO) Take 1 capsule by mouth daily. philips colon health    . spironolactone (ALDACTONE) 25 MG tablet Take 1 tablet (25 mg total) by mouth daily. 30 tablet 2  . SUMAtriptan (IMITREX) 100 MG tablet Take 1 tablet (100 mg total) by mouth as needed. 10 tablet 2  . ALPRAZolam (XANAX) 0.25 MG tablet Take 1 tablet by mouth 2 (two) times daily as needed.  1  . temazepam (RESTORIL) 30 MG capsule Take 30 mg by mouth at bedtime as needed.      No current facility-administered medications on file prior to visit.      Objective:  Objective  Physical Exam  Constitutional: She is oriented to person, place, and time. She appears well-developed and well-nourished.  HENT:  Head: Normocephalic and atraumatic.  Eyes: Conjunctivae and EOM are normal.  Neck: Normal range of motion. Neck supple. No JVD present. Carotid bruit is not present. No thyromegaly present.  Cardiovascular: Normal rate, regular rhythm and normal heart sounds.  No murmur heard. Pulmonary/Chest: Effort normal and breath sounds normal. No respiratory distress. She has no wheezes. She has no rales. She exhibits no tenderness.  Musculoskeletal: She exhibits no edema.  Neurological: She is alert and oriented to person, place, and time.  Psychiatric: She has a normal mood and affect.  Nursing note and vitals reviewed.  BP 124/80 (BP Location: Left Arm, Patient Position:  Sitting, Cuff Size: Normal)   Pulse (!) 58   Ht 5\' 4"  (1.626 m)   Wt 147 lb 12.8 oz (67 kg)   SpO2 97%   BMI 25.37 kg/m  Wt Readings from Last 3 Encounters:  08/14/17 147 lb 12.8 oz (67 kg)  07/31/17 149 lb (67.6 kg)  05/22/17 148 lb (67.1 kg)     Lab Results  Component Value Date   WBC 7.7 08/14/2017   HGB 14.3 08/14/2017   HCT 44.1 08/14/2017   PLT 251.0 08/14/2017    GLUCOSE 94 08/14/2017   CHOL 188 08/14/2017   TRIG 97.0 08/14/2017   HDL 62.20 08/14/2017   LDLCALC 107 (H) 08/14/2017   ALT 7 08/14/2017   AST 21 08/14/2017   NA 139 08/14/2017   K 5.2 (H) 08/14/2017   CL 104 08/14/2017   CREATININE 0.90 08/14/2017   BUN 17 08/14/2017   CO2 28 08/14/2017   TSH 1.21 03/07/2011    Mr Cervical Spine Wo Contrast  Result Date: 10/11/2010 Clinical Data: Posterior neck with right shoulder and arm pain for 6 months.  No known injury or prior relevant surgery.  MRI CERVICAL SPINE WITHOUT CONTRAST  Technique:  Multiplanar and multiecho pulse sequences of the cervical spine, to include the craniocervical junction and cervicothoracic junction, were obtained according to standard protocol without intravenous contrast.  Comparison: None.  Findings: The alignment is satisfactory.  There is no evidence of fracture or paraspinal ligamentous injury.  The craniocervical junction appears normal.  The cervical cord is normal in signal and caliber.  The spinal canal is adequately patent at all levels.  C2-C3:  There is moderate asymmetric facet hypertrophy on the left resulting in mild narrowing of the left foramen.  No nerve root encroachment is demonstrated.  C3-C4:  There are left greater than right facet degenerative changes with a resulting minimal anterolisthesis.  There is mild biforaminal stenosis without definite C4 nerve root encroachment.  C4-C5:  There is more symmetric facet hypertrophy with minimal disc bulging.  The right foramen is mildly narrowed.  There is no definite nerve root encroachment.  C5-C6:  Small central disc protrusion and mild bilateral facet hypertrophy.  No significant foraminal stenosis or nerve root encroachment.  C6-C7:  Disc height and hydration are maintained.  There is no spinal stenosis or nerve root encroachment.  C7-T1:  Facet degenerative changes are asymmetric to the left. There is no foraminal stenosis or nerve root encroachment.  IMPRESSION:   1.  Multilevel cervical facet arthropathy, generally worse on the left and most advanced at C2-C3 and C3-C4.  This facet disease may certainly contribute to neck pain. 2.  No high-grade foraminal stenosis or nerve root encroachment. 3.  Small central disc protrusion at C5-C6.  No large disc herniation or central stenosis. Provider: Reesa Chew  Mr Extrem Up Jt*r* W/o Cm  Result Date: 10/11/2010 Clinical Data: Neck pain radiating to the right shoulder and arm for 6 months.  No acute injury or prior relevant surgery.  MRI OF THE RIGHT SHOULDER WITHOUT CONTRAST  Technique:  Multiplanar, multisequence MR imaging of the right shoulder was performed.  No intravenous contrast was administered.  Comparison:  None.  Findings:  There is no significant shoulder joint effusion.  A small amount of fluid is present anteriorly in the subacromial - subdeltoid bursa.  The anterior leading edge of the distal supraspinatus tendon is attenuated with partial tearing.  No definite full-thickness tendon tear, tendon retraction or muscular atrophy is identified.  The infraspinatus tendon demonstrates mild to moderate tendinosis, but no tear.  The subscapularis tendon demonstrates mild tendinosis, but no tear.  There is degenerative subchondral cyst formation inferiorly in the glenoid.  There is degeneration of the posterior labrum without well-defined tear.  The intra-articular portion of the biceps tendon is mildly degenerated, but intact.  The humeral head articular cartilage appears preserved.  The acromion is type 1.  There are moderate acromioclavicular degenerative changes with mild subacromial spurring.  IMPRESSION:  1.  Partial insertional tear of the supraspinatus tendon as described.  No definite full-thickness rotator cuff tear. 2.  Infraspinatus, subscapularis and biceps tendinosis without tear. 3.  Glenohumeral degenerative changes with inferior glenoid subchondral cyst formation and posterior labral degeneration.  Provider: [place]:  Plan  I have discontinued Laura Hopkins's carvedilol. I am also having her start on olmesartan, ranitidine, loratadine, and Zoster Vaccine Adjuvanted. Additionally, I am having her maintain her estradiol, temazepam, Glucosamine-Chondroitin (OSTEO BI-FLEX REGULAR STRENGTH PO), ALPRAZolam, Probiotic Product (PROBIOTIC DAILY PO), SUMAtriptan, Fish Oil, Flax Seed Oil, spironolactone, Melatonin, and Multiple Vitamins-Minerals (MULTIPLE VITAMINS/WOMENS PO).  Meds ordered this encounter  Medications  . Melatonin 3 MG TABS    Sig: Take at bedtime as needed by mouth.  . Multiple Vitamins-Minerals (MULTIPLE VITAMINS/WOMENS PO)    Sig: Take by mouth.  . olmesartan (BENICAR) 20 MG tablet    Sig: Take 1 tablet (20 mg total) daily by mouth.    Dispense:  30 tablet    Refill:  2  . ranitidine (ZANTAC) 150 MG tablet    Sig: Take 1 tablet (150 mg total) 2 (two) times daily by mouth.    Dispense:  60 tablet    Refill:  2  . loratadine (CLARITIN) 10 MG tablet    Sig: Take 1 tablet (10 mg total) daily by mouth.    Dispense:  30 tablet    Refill:  11  . Zoster Vaccine Adjuvanted [facility]) injection    Sig: Inject 0.5 mLs once for 1 dose into the muscle. Repeat in 2-6 months    Dispense:  0.5 mL    Refill:  1    Problem List Items Addressed This Visit      Unprioritized   Essential hypertension    Well controlled, no changes to meds. Encouraged heart healthy diet such as the DASH diet and exercise as tolerated.       Relevant Medications   olmesartan (BENICAR) 20 MG tablet   Other Relevant Orders   CBC with Differential/Platelet (Completed)   Lipid panel (Completed)   Comprehensive metabolic panel (Completed)   POCT Urinalysis Dipstick (Automated)    Other Visit Diagnoses    Encounter for Medicare annual wellness exam    -  Primary   Dysphagia, unspecified type       Relevant Medications   ranitidine (ZANTAC) 150 MG tablet   Post-nasal  drip       Relevant Medications   loratadine (CLARITIN) 10 MG tablet      Follow-up: Return in about 6 months (around 02/11/2018), or if symptoms worsen or fail to improve, for annual exam, fasting.  Ann Held, DO

## 2017-08-16 ENCOUNTER — Telehealth: Payer: Self-pay | Admitting: *Deleted

## 2017-08-16 DIAGNOSIS — E876 Hypokalemia: Secondary | ICD-10-CM

## 2017-08-16 NOTE — Telephone Encounter (Signed)
See my chart message

## 2017-08-17 ENCOUNTER — Telehealth: Payer: Self-pay | Admitting: Family Medicine

## 2017-08-17 NOTE — Telephone Encounter (Signed)
I have not received anything from pharmacy

## 2017-08-17 NOTE — Telephone Encounter (Signed)
Pt called to inquire about medication change (blood pressure med) due to cost at pharmacy. Said that [employer] sent a fax and hasn't heard anything back.   Please advise. CB: [phone]

## 2017-08-21 ENCOUNTER — Encounter: Payer: Self-pay | Admitting: Family Medicine

## 2017-08-21 NOTE — Telephone Encounter (Signed)
She will have to get a copy of her formulary to [place] -- she has tried everything and says she has side effects

## 2017-08-21 NOTE — Telephone Encounter (Signed)
Patient returning call and would like to know if she should drop of the formulary best # [phone]

## 2017-08-21 NOTE — Telephone Encounter (Signed)
Patient notified to get formulary.

## 2017-08-21 NOTE — Telephone Encounter (Signed)
Patient states that benicar costs $100 for 30 tabs

## 2017-08-22 NOTE — Telephone Encounter (Signed)
Left detailed message on machine.

## 2017-08-22 NOTE — Telephone Encounter (Signed)
Patient can drop off formulary or fax it in [phone]

## 2017-08-28 ENCOUNTER — Telehealth: Payer: Self-pay | Admitting: Family Medicine

## 2017-08-28 DIAGNOSIS — R600 Localized edema: Secondary | ICD-10-CM

## 2017-08-28 DIAGNOSIS — I10 Essential (primary) hypertension: Secondary | ICD-10-CM

## 2017-08-28 MED ORDER — SPIRONOLACTONE 25 MG PO TABS: 25.0000 mg | ORAL_TABLET | Freq: Every day | ORAL | 0 refills | Status: DC

## 2017-08-28 NOTE — Telephone Encounter (Signed)
Copied from [place]. Topic: Quick Communication - See Telephone Encounter >> Aug 28, 2017  9:12 AM Clack, Laban Emperor wrote: CRM for notification. See Telephone encounter for:  Eddie Dibbles with [facility] is calling requesting a 910 day supply on the pt  spironolactone (ALDACTONE) 25 MG tablet [[id number]] . 08/28/17.

## 2017-08-28 NOTE — Telephone Encounter (Signed)
Patient is requesting a 90 day supply of medication

## 2017-08-28 NOTE — Telephone Encounter (Signed)
mychart message sent

## 2017-08-28 NOTE — Telephone Encounter (Signed)
rx sent

## 2017-08-29 ENCOUNTER — Telehealth: Payer: Self-pay | Admitting: *Deleted

## 2017-08-29 ENCOUNTER — Other Ambulatory Visit (INDEPENDENT_AMBULATORY_CARE_PROVIDER_SITE_OTHER): Payer: Medicare HMO

## 2017-08-29 DIAGNOSIS — E875 Hyperkalemia: Secondary | ICD-10-CM | POA: Diagnosis not present

## 2017-08-29 LAB — BASIC METABOLIC PANEL
BUN: 17 mg/dL (ref 6–23)
CALCIUM: 9.5 mg/dL (ref 8.4–10.5)
CO2: 28 meq/L (ref 19–32)
CREATININE: 1.01 mg/dL (ref 0.40–1.20)
Chloride: 104 mEq/L (ref 96–112)
GFR: 57.77 mL/min — AB (ref >60.00)
Glucose, Bld: 96 mg/dL (ref 70–99)
Potassium: 3.9 mEq/L (ref 3.5–5.1)
SODIUM: 138 meq/L (ref 135–145)

## 2017-08-29 NOTE — Telephone Encounter (Signed)
Received Medical records from [facility]; forwarded to provider/SLS 11/27

## 2017-08-29 NOTE — Telephone Encounter (Signed)
Chical List book is in office for Dr. Sherrine Maples to review. Please return to front desk staff to mail back to patient once reviewed.

## 2017-08-30 ENCOUNTER — Encounter: Payer: Self-pay | Admitting: Family Medicine

## 2017-08-31 ENCOUNTER — Encounter: Payer: Self-pay | Admitting: Family Medicine

## 2017-08-31 ENCOUNTER — Other Ambulatory Visit: Payer: Self-pay | Admitting: *Deleted

## 2017-08-31 MED ORDER — IRBESARTAN 150 MG PO TABS: 150.0000 mg | ORAL_TABLET | Freq: Every day | ORAL | 1 refills | Status: DC

## 2017-08-31 NOTE — Telephone Encounter (Signed)
She can con't to take them

## 2017-08-31 NOTE — Telephone Encounter (Signed)
Dr. Etter Sjogren changed to Avapro 150mg  1 tab po qd #90 w.1rf.  Advised patient that rx was sent in and to schedule nurse visit for bp check in 2-3 weeks.

## 2017-09-14 ENCOUNTER — Ambulatory Visit: Payer: Medicare HMO

## 2017-09-18 DIAGNOSIS — Z6825 Body mass index (BMI) 25.0-25.9, adult: Secondary | ICD-10-CM | POA: Diagnosis not present

## 2017-09-18 DIAGNOSIS — Z124 Encounter for screening for malignant neoplasm of cervix: Secondary | ICD-10-CM | POA: Diagnosis not present

## 2017-09-18 DIAGNOSIS — Z1272 Encounter for screening for malignant neoplasm of vagina: Secondary | ICD-10-CM | POA: Diagnosis not present

## 2017-09-18 DIAGNOSIS — Z1231 Encounter for screening mammogram for malignant neoplasm of breast: Secondary | ICD-10-CM | POA: Diagnosis not present

## 2017-09-18 LAB — HM MAMMOGRAPHY

## 2017-09-19 LAB — HM PAP SMEAR: HM PAP: NEGATIVE

## 2017-09-20 ENCOUNTER — Ambulatory Visit (INDEPENDENT_AMBULATORY_CARE_PROVIDER_SITE_OTHER): Payer: Medicare HMO | Admitting: Medical

## 2017-09-20 ENCOUNTER — Encounter: Payer: Self-pay | Admitting: Medical

## 2017-09-20 VITALS — BP 135/96 | HR 65

## 2017-09-20 DIAGNOSIS — I10 Essential (primary) hypertension: Secondary | ICD-10-CM | POA: Diagnosis not present

## 2017-09-20 NOTE — Progress Notes (Signed)
Pre visit review using our clinic tool,if applicable. No additional management support is needed unless otherwise documented below in the visit note.   Patient in for BP check per order from Dr. Carollee Herter dated 08/31/17.  Patient states she has NOT had BP medication today. States she has no complaints today.  BP = 135/96  P= 65  Per Mackie Pai PA-C patient to continue medications as ordered and return for OV with provider in 3-4 months.   Patient to schedule appointment at check out.  No changes to patient's BP medication regimen today.  [place], Percell Miller, PA-C

## 2017-10-09 DIAGNOSIS — H43813 Vitreous degeneration, bilateral: Secondary | ICD-10-CM | POA: Diagnosis not present

## 2017-10-09 DIAGNOSIS — H524 Presbyopia: Secondary | ICD-10-CM | POA: Diagnosis not present

## 2017-10-09 DIAGNOSIS — H40013 Open angle with borderline findings, low risk, bilateral: Secondary | ICD-10-CM | POA: Diagnosis not present

## 2017-10-09 DIAGNOSIS — H04123 Dry eye syndrome of bilateral lacrimal glands: Secondary | ICD-10-CM | POA: Diagnosis not present

## 2017-11-20 ENCOUNTER — Other Ambulatory Visit: Payer: Self-pay | Admitting: Family Medicine

## 2017-11-20 DIAGNOSIS — R600 Localized edema: Secondary | ICD-10-CM

## 2017-11-20 DIAGNOSIS — I10 Essential (primary) hypertension: Secondary | ICD-10-CM

## 2017-11-20 DIAGNOSIS — R69 Illness, unspecified: Secondary | ICD-10-CM | POA: Diagnosis not present

## 2017-12-11 ENCOUNTER — Other Ambulatory Visit: Payer: Self-pay | Admitting: Family Medicine

## 2017-12-11 DIAGNOSIS — G43809 Other migraine, not intractable, without status migrainosus: Secondary | ICD-10-CM

## 2017-12-18 ENCOUNTER — Ambulatory Visit: Payer: Medicare HMO | Admitting: Family Medicine

## 2017-12-22 ENCOUNTER — Ambulatory Visit (INDEPENDENT_AMBULATORY_CARE_PROVIDER_SITE_OTHER): Payer: Medicare HMO | Admitting: Family Medicine

## 2017-12-22 ENCOUNTER — Encounter: Payer: Self-pay | Admitting: Family Medicine

## 2017-12-22 VITALS — BP 128/62 | HR 64 | Temp 97.9°F | Resp 16 | Ht 64.0 in | Wt 151.4 lb

## 2017-12-22 DIAGNOSIS — I10 Essential (primary) hypertension: Secondary | ICD-10-CM

## 2017-12-22 DIAGNOSIS — G4700 Insomnia, unspecified: Secondary | ICD-10-CM | POA: Diagnosis not present

## 2017-12-22 LAB — CBC WITH DIFFERENTIAL/PLATELET
BASOS ABS: 0 10*3/uL (ref 0.0–0.1)
Basophils Relative: 0.7 % (ref 0.0–3.0)
Eosinophils Absolute: 0.1 10*3/uL (ref 0.0–0.7)
Eosinophils Relative: 2.1 % (ref 0.0–5.0)
HEMATOCRIT: 41.1 % (ref 36.0–46.0)
Hemoglobin: 13.7 g/dL (ref 12.0–15.0)
LYMPHS PCT: 22.2 % (ref 12.0–46.0)
Lymphs Abs: 1.5 10*3/uL (ref 0.7–4.0)
MCHC: 33.3 g/dL (ref 30.0–36.0)
MCV: 95.6 fl (ref 78.0–100.0)
MONOS PCT: 7.5 % (ref 3.0–12.0)
Monocytes Absolute: 0.5 10*3/uL (ref 0.1–1.0)
NEUTROS PCT: 67.5 % (ref 43.0–77.0)
Neutro Abs: 4.4 10*3/uL (ref 1.4–7.7)
Platelets: 258 10*3/uL (ref 150.0–400.0)
RBC: 4.3 Mil/uL (ref 3.87–5.11)
RDW: 14 % (ref 11.5–15.5)
WBC: 6.6 10*3/uL (ref 4.0–10.5)

## 2017-12-22 LAB — COMPREHENSIVE METABOLIC PANEL
ALK PHOS: 63 U/L (ref 39–117)
ALT: 7 U/L (ref 0–35)
AST: 18 U/L (ref 0–37)
Albumin: 4.3 g/dL (ref 3.5–5.2)
BILIRUBIN TOTAL: 0.8 mg/dL (ref 0.2–1.2)
BUN: 13 mg/dL (ref 6–23)
CALCIUM: 10.3 mg/dL (ref 8.4–10.5)
CO2: 28 mEq/L (ref 19–32)
CREATININE: 0.89 mg/dL (ref 0.40–1.20)
Chloride: 104 mEq/L (ref 96–112)
GFR: 66.79 mL/min (ref >60.00)
GLUCOSE: 93 mg/dL (ref 70–99)
Potassium: 5.3 mEq/L — ABNORMAL HIGH (ref 3.5–5.1)
Sodium: 141 mEq/L (ref 135–145)
TOTAL PROTEIN: 7.1 g/dL (ref 6.0–8.3)

## 2017-12-22 LAB — LIPID PANEL
Cholesterol: 158 mg/dL (ref 0–200)
HDL: 61.8 mg/dL (ref >39.00)
LDL Cholesterol: 79 mg/dL (ref 0–99)
NONHDL: 96.14
Total CHOL/HDL Ratio: 3
Triglycerides: 88 mg/dL (ref 0.0–149.0)
VLDL: 17.6 mg/dL (ref 0.0–40.0)

## 2017-12-22 MED ORDER — TEMAZEPAM 30 MG PO CAPS: 30.0000 mg | ORAL_CAPSULE | Freq: Every evening | ORAL | 2 refills | Status: DC | PRN

## 2017-12-22 NOTE — Progress Notes (Signed)
Subjective:  I acted as a Education administrator for [employer]. Yancey Flemings, [place]   Patient ID: Laura Hopkins, female    DOB: [birth year], 72 y.o.   MRN: [id number]  Chief Complaint  Patient presents with  . Hypertension    HPI  Patient is in today for follow up on hypertension.   No complaints'  Patient Care Team: Carollee Herter, Alferd Apa, DO as PCP - General Dian Queen, MD as Consulting Physician (Obstetrics and Gynecology) Michel Santee, MD as Consulting Physician (Neurology) Martinique, Amy, MD as Consulting Physician (Dermatology)   Past Medical History:  Diagnosis Date  . Arthritis    hands and neck   . Headache(784.0)   . Hypertension   . Skin cancer    basal cell    Past Surgical History:  Procedure Laterality Date  . ABDOMINAL HYSTERECTOMY  1995  . COLONOSCOPY  2007   colon--negative  . EYE SURGERY Bilateral 03/03/2017   cataract sx with lens implant  . NASAL SEPTUM SURGERY  1983  . SHOULDER SURGERY  2000  . TUBAL LIGATION  1978  . WISDOM TOOTH EXTRACTION      Family History  Problem Relation Age of Onset  . Diabetes Father   . Hypertension Father   . Skin cancer Brother        melanoma  . Hypertension Mother   . Colon cancer Neg Hx   . Colon polyps Neg Hx   . Esophageal cancer Neg Hx   . Rectal cancer Neg Hx   . Stomach cancer Neg Hx     Social History   Socioeconomic History  . Marital status: Married    Spouse name: Not on file  . Number of children: 2  . Years of education: Not on file  . Highest education level: Not on file  Occupational History  . 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 Financial resource strain: Not on file  . Food insecurity:    Worry: Not on file    Inability: Not on file  . Transportation needs:    Medical: Not on file    Non-medical: Not on file  Tobacco Use  . Smoking status: Former Smoker    Packs/day: 0.30    Last attempt to quit: 03/07/1999    Years since quitting:  18.8  . Smokeless tobacco: Never Used  Substance and Sexual Activity  . Alcohol use: No    Alcohol/week: 0.0 oz  . Drug use: No  . Sexual activity: Never    Partners: Male  Lifestyle  . Physical activity:    Days per week: Not on file    Minutes per session: Not on file  . Stress: Not on file  Relationships  . Social connections:    Talks on phone: Not on file    Gets together: Not on file    Attends religious service: Not on file    Active member of club or organization: Not on file    Attends meetings of clubs or organizations: Not on file    Relationship status: Not on file  . Intimate partner violence:    Fear of current or ex partner: Not on file    Emotionally abused: Not on file    Physically abused: Not on file    Forced sexual activity: Not on file  Other Topics Concern  . Not on file  Social History Narrative  . Not on file    Outpatient  Medications Prior to Visit  Medication Sig Dispense Refill  . ALPRAZolam (XANAX) 0.25 MG tablet Take 1 tablet by mouth 2 (two) times daily as needed.  1  . estradiol (VIVELLE-DOT) 0.05 MG/24HR Place 1 patch onto the skin once a week.      . Flaxseed, Linseed, (FLAX SEED OIL) 1000 MG CAPS Take 1 capsule by mouth daily.    . Glucosamine-Chondroitin (OSTEO BI-FLEX REGULAR STRENGTH PO) Take 1 tablet by mouth daily.     . irbesartan (AVAPRO) 150 MG tablet TAKE 1 TABLET BY MOUTH EVERY DAY 90 tablet 0  . Multiple Vitamins-Minerals (MULTIPLE VITAMINS/WOMENS PO) Take by mouth.    . Omega-3 Fatty Acids (FISH OIL) 1200 MG CAPS Take 1 capsule by mouth daily.    . Probiotic Product (PROBIOTIC DAILY PO) Take 1 capsule by mouth daily. philips colon health    . spironolactone (ALDACTONE) 25 MG tablet TAKE 1 TABLET BY MOUTH EVERY DAY 90 tablet 0  . SUMAtriptan (IMITREX) 100 MG tablet TAKE 1 TABLET (100 MG TOTAL) BY MOUTH AS NEEDED. 10 tablet 2  . temazepam (RESTORIL) 30 MG capsule Take 30 mg by mouth at bedtime as needed.     . Melatonin 3 MG  TABS Take at bedtime as needed by mouth.    . loratadine (CLARITIN) 10 MG tablet Take 1 tablet (10 mg total) daily by mouth. (Patient not taking: Reported on 12/22/2017) 30 tablet 11  . olmesartan (BENICAR) 20 MG tablet Take 1 tablet (20 mg total) daily by mouth. (Patient not taking: Reported on 12/22/2017) 30 tablet 2  . ranitidine (ZANTAC) 150 MG tablet Take 1 tablet (150 mg total) 2 (two) times daily by mouth. (Patient not taking: Reported on 12/22/2017) 60 tablet 2   No facility-administered medications prior to visit.     Allergies  Allergen Reactions  . Codeine Other (See Comments)    Makes pt hyper     Review of Systems  Constitutional: Negative for chills, fever and malaise/fatigue.  HENT: Negative for congestion and hearing loss.   Eyes: Negative for discharge.  Respiratory: Negative for cough, sputum production and shortness of breath.   Cardiovascular: Negative for chest pain, palpitations and leg swelling.  Gastrointestinal: Negative for abdominal pain, blood in stool, constipation, diarrhea, heartburn, nausea and vomiting.  Genitourinary: Negative for dysuria, frequency, hematuria and urgency.  Musculoskeletal: Negative for back pain, falls and myalgias.  Skin: Negative for rash.  Neurological: Negative for dizziness, sensory change, loss of consciousness, weakness and headaches.  Endo/Heme/Allergies: Negative for environmental allergies. Does not bruise/bleed easily.  Psychiatric/Behavioral: Negative for depression and suicidal ideas. The patient is not nervous/anxious and does not have insomnia.        Objective:    Physical Exam  Constitutional: She is oriented to person, place, and time. She appears well-developed and well-nourished.  HENT:  Head: Normocephalic and atraumatic.  Eyes: Conjunctivae and EOM are normal.  Neck: Normal range of motion. Neck supple. No JVD present. Carotid bruit is not present. No thyromegaly present.  Cardiovascular: Normal rate, regular  rhythm and normal heart sounds.  No murmur heard. Pulmonary/Chest: Effort normal and breath sounds normal. No respiratory distress. She has no wheezes. She has no rales. She exhibits no tenderness.  Musculoskeletal: She exhibits no edema.  Neurological: She is alert and oriented to person, place, and time.  Psychiatric: She has a normal mood and affect.  Nursing note and vitals reviewed.   BP 128/62 (BP Location: Left Arm, Patient Position: Sitting, Cuff Size:  Normal)   Pulse 64   Temp 97.9 F (36.6 C) (Oral)   Resp 16   Ht 5\' 4"  (1.626 m)   Wt 151 lb 6.4 oz (68.7 kg)   SpO2 99%   BMI 25.99 kg/m  Wt Readings from Last 3 Encounters:  12/22/17 151 lb 6.4 oz (68.7 kg)  08/14/17 147 lb 12.8 oz (67 kg)  07/31/17 149 lb (67.6 kg)   BP Readings from Last 3 Encounters:  12/22/17 128/62  09/20/17 (!) 135/96  08/14/17 124/80     Immunization History  Administered Date(s) Administered  . Influenza Split 07/24/2017  . Influenza Whole 07/03/2006, 09/11/2007, 07/15/2008, 07/30/2009  . Influenza, High Dose Seasonal PF 08/27/2014, 08/06/2015  . Influenza,inj,Quad PF,6+ Mos 08/12/2013  . Pneumococcal Conjugate-13 10/02/2015  . Pneumococcal Polysaccharide-23 03/27/2014    Health Maintenance  Topic Date Due  . MAMMOGRAM  09/02/2018  . TETANUS/TDAP  03/07/2019  . COLONOSCOPY  04/12/2026  . INFLUENZA VACCINE  Completed  . DEXA SCAN  Completed  . Hepatitis C Screening  Completed  . PNA vac Low Risk Adult  Completed    Lab Results  Component Value Date   WBC 6.6 12/22/2017   HGB 13.7 12/22/2017   HCT 41.1 12/22/2017   PLT 258.0 12/22/2017   GLUCOSE 93 12/22/2017   CHOL 158 12/22/2017   TRIG 88.0 12/22/2017   HDL 61.80 12/22/2017   LDLCALC 79 12/22/2017   ALT 7 12/22/2017   AST 18 12/22/2017   NA 141 12/22/2017   K 5.3 (H) 12/22/2017   CL 104 12/22/2017   CREATININE 0.89 12/22/2017   BUN 13 12/22/2017   CO2 28 12/22/2017   TSH 1.21 03/07/2011    Lab Results  Component  Value Date   TSH 1.21 03/07/2011   Lab Results  Component Value Date   WBC 6.6 12/22/2017   HGB 13.7 12/22/2017   HCT 41.1 12/22/2017   MCV 95.6 12/22/2017   PLT 258.0 12/22/2017   Lab Results  Component Value Date   NA 141 12/22/2017   K 5.3 (H) 12/22/2017   CO2 28 12/22/2017   GLUCOSE 93 12/22/2017   BUN 13 12/22/2017   CREATININE 0.89 12/22/2017   BILITOT 0.8 12/22/2017   ALKPHOS 63 12/22/2017   AST 18 12/22/2017   ALT 7 12/22/2017   PROT 7.1 12/22/2017   ALBUMIN 4.3 12/22/2017   CALCIUM 10.3 12/22/2017   GFR 66.79 12/22/2017   Lab Results  Component Value Date   CHOL 158 12/22/2017   Lab Results  Component Value Date   HDL 61.80 12/22/2017   Lab Results  Component Value Date   LDLCALC 79 12/22/2017   Lab Results  Component Value Date   TRIG 88.0 12/22/2017   Lab Results  Component Value Date   CHOLHDL 3 12/22/2017   No results found for: HGBA1C       Assessment & Plan:   Problem List Items Addressed This Visit      Unprioritized   Essential hypertension - Primary    Well controlled, no changes to meds. Encouraged heart healthy diet such as the DASH diet and exercise as tolerated.       Relevant Orders   CBC with Differential/Platelet (Completed)   Comprehensive metabolic panel (Completed)   Lipid panel (Completed)   Insomnia    Stable Refill temazepam      Relevant Medications   temazepam (RESTORIL) 30 MG capsule      I have discontinued Dylin Kauth's olmesartan, ranitidine, and  loratadine. I have also changed her temazepam. Additionally, I am having her maintain her estradiol, Glucosamine-Chondroitin (OSTEO BI-FLEX REGULAR STRENGTH PO), ALPRAZolam, Probiotic Product (PROBIOTIC DAILY PO), Fish Oil, Flax Seed Oil, Melatonin, Multiple Vitamins-Minerals (MULTIPLE VITAMINS/WOMENS PO), spironolactone, irbesartan, and SUMAtriptan.  Meds ordered this encounter  Medications  . temazepam (RESTORIL) 30 MG capsule    Sig: Take 1 capsule (30  mg total) by mouth at bedtime as needed.    Dispense:  30 capsule    Refill:  2    CMA served as scribe during this visit. History, Physical and Plan performed by medical provider. Documentation and orders reviewed and attested to.  Ann Held, DO

## 2017-12-22 NOTE — Patient Instructions (Signed)

## 2017-12-23 DIAGNOSIS — G4700 Insomnia, unspecified: Secondary | ICD-10-CM | POA: Insufficient documentation

## 2017-12-23 HISTORY — DX: Insomnia, unspecified: G47.00

## 2017-12-23 NOTE — Assessment & Plan Note (Addendum)
Stable Refill temazepam

## 2017-12-23 NOTE — Assessment & Plan Note (Signed)
Well controlled, no changes to meds. Encouraged heart healthy diet such as the DASH diet and exercise as tolerated.  °

## 2017-12-26 NOTE — Addendum Note (Signed)
Addended by: Bartholome Bill on: 12/26/2017 01:55 PM   Modules accepted: Orders

## 2017-12-27 ENCOUNTER — Telehealth: Payer: Self-pay | Admitting: *Deleted

## 2017-12-27 NOTE — Telephone Encounter (Signed)
[facility] [address] sent a fax stating that temazepam 30mg  is on back order and would like rx for 15mg .

## 2017-12-27 NOTE — Telephone Encounter (Signed)
Ok to change to 15 mg 2 po qhs prn

## 2017-12-28 NOTE — Telephone Encounter (Signed)
Relation to pt: self Call back [place]: [id number] [place], [place] [phone] (Phone) [phone] (Fax)   Reason for call:  Patient checking on the status of revised temazepam (RESTORIL) 30 MG capsule Rx. Patient request Rx sent today, please advise

## 2017-12-29 ENCOUNTER — Other Ambulatory Visit (INDEPENDENT_AMBULATORY_CARE_PROVIDER_SITE_OTHER): Payer: Medicare HMO

## 2017-12-29 ENCOUNTER — Other Ambulatory Visit: Payer: Self-pay | Admitting: Family Medicine

## 2017-12-29 DIAGNOSIS — I10 Essential (primary) hypertension: Secondary | ICD-10-CM

## 2017-12-29 DIAGNOSIS — G4700 Insomnia, unspecified: Secondary | ICD-10-CM

## 2017-12-29 LAB — BASIC METABOLIC PANEL
BUN: 14 mg/dL (ref 6–23)
CALCIUM: 9.2 mg/dL (ref 8.4–10.5)
CO2: 28 mEq/L (ref 19–32)
CREATININE: 0.98 mg/dL (ref 0.40–1.20)
Chloride: 104 mEq/L (ref 96–112)
GFR: 59.76 mL/min — ABNORMAL LOW (ref >60.00)
Glucose, Bld: 92 mg/dL (ref 70–99)
Potassium: 4.1 mEq/L (ref 3.5–5.1)
Sodium: 139 mEq/L (ref 135–145)

## 2017-12-29 MED ORDER — TEMAZEPAM 15 MG PO CAPS
ORAL_CAPSULE | ORAL | 1 refills | Status: DC
Start: 2017-12-29 — End: 2018-02-12

## 2017-12-29 MED ORDER — TEMAZEPAM 15 MG PO CAPS: ORAL_CAPSULE | ORAL | 1 refills | Status: DC

## 2017-12-29 NOTE — Telephone Encounter (Signed)
done

## 2017-12-29 NOTE — Telephone Encounter (Signed)
rx request sent to PCP

## 2017-12-29 NOTE — Telephone Encounter (Signed)
Rx was changed to dur to back order please advise

## 2017-12-29 NOTE — Addendum Note (Signed)
Addended by: Magdalene Molly A on: 12/29/2017 08:09 AM   Modules accepted: Orders

## 2018-01-02 DIAGNOSIS — R69 Illness, unspecified: Secondary | ICD-10-CM | POA: Diagnosis not present

## 2018-01-08 DIAGNOSIS — R69 Illness, unspecified: Secondary | ICD-10-CM | POA: Diagnosis not present

## 2018-01-15 DIAGNOSIS — Z85828 Personal history of other malignant neoplasm of skin: Secondary | ICD-10-CM | POA: Diagnosis not present

## 2018-01-15 DIAGNOSIS — L821 Other seborrheic keratosis: Secondary | ICD-10-CM | POA: Diagnosis not present

## 2018-01-15 DIAGNOSIS — L570 Actinic keratosis: Secondary | ICD-10-CM | POA: Diagnosis not present

## 2018-02-12 ENCOUNTER — Ambulatory Visit (INDEPENDENT_AMBULATORY_CARE_PROVIDER_SITE_OTHER): Payer: Medicare HMO | Admitting: Family Medicine

## 2018-02-12 ENCOUNTER — Encounter: Payer: Self-pay | Admitting: Family Medicine

## 2018-02-12 VITALS — BP 98/70 | HR 64 | Temp 98.2°F | Resp 16 | Ht 64.0 in | Wt 150.4 lb

## 2018-02-12 DIAGNOSIS — I10 Essential (primary) hypertension: Secondary | ICD-10-CM | POA: Diagnosis not present

## 2018-02-12 DIAGNOSIS — G4700 Insomnia, unspecified: Secondary | ICD-10-CM

## 2018-02-12 DIAGNOSIS — Z0001 Encounter for general adult medical examination with abnormal findings: Secondary | ICD-10-CM

## 2018-02-12 DIAGNOSIS — Z79899 Other long term (current) drug therapy: Secondary | ICD-10-CM

## 2018-02-12 DIAGNOSIS — R69 Illness, unspecified: Secondary | ICD-10-CM | POA: Diagnosis not present

## 2018-02-12 DIAGNOSIS — F320 Major depressive disorder, single episode, mild: Secondary | ICD-10-CM | POA: Diagnosis not present

## 2018-02-12 MED ORDER — METOPROLOL SUCCINATE ER 25 MG PO TB24: 25.0000 mg | ORAL_TABLET | Freq: Every day | ORAL | 3 refills | Status: DC

## 2018-02-12 MED ORDER — BUPROPION HCL ER (XL) 150 MG PO TB24: 150.0000 mg | ORAL_TABLET | Freq: Every day | ORAL | 2 refills | Status: DC

## 2018-02-12 MED ORDER — TEMAZEPAM 30 MG PO CAPS: 30.0000 mg | ORAL_CAPSULE | Freq: Every evening | ORAL | 5 refills | Status: DC | PRN

## 2018-02-12 NOTE — Patient Instructions (Signed)
Preventive Care 70 Years and Older, Female Preventive care refers to lifestyle choices and visits with your health care provider that can promote health and wellness. What does preventive care include?  A yearly physical exam. This is also called an annual well check.  Dental exams once or twice a year.  Routine eye exams. Ask your health care provider how often you should have your eyes checked.  Personal lifestyle choices, including: ? Daily care of your teeth and gums. ? Regular physical activity. ? Eating a healthy diet. ? Avoiding tobacco and drug use. ? Limiting alcohol use. ? Practicing safe sex. ? Taking low-dose aspirin every day. ? Taking vitamin and mineral supplements as recommended by your health care provider. What happens during an annual well check? The services and screenings done by your health care provider during your annual well check will depend on your age, overall health, lifestyle risk factors, and family history of disease. Counseling Your health care provider may ask you questions about your:  Alcohol use.  Tobacco use.  Drug use.  Emotional well-being.  Home and relationship well-being.  Sexual activity.  Eating habits.  History of falls.  Memory and ability to understand (cognition).  Work and work environment.  Reproductive health.  Screening You may have the following tests or measurements:  Height, weight, and BMI.  Blood pressure.  Lipid and cholesterol levels. These may be checked every 5 years, or more frequently if you are over 70 years old.  Skin check.  Lung cancer screening. You may have this screening every year starting at age 70 if you have a 30-pack-year history of smoking and currently smoke or have quit within the past 15 years.  Fecal occult blood test (FOBT) of the stool. You may have this test every year starting at age 70.  Flexible sigmoidoscopy or colonoscopy. You may have a sigmoidoscopy every 5 years or  a colonoscopy every 10 years starting at age 70.  Hepatitis C blood test.  Hepatitis B blood test.  Sexually transmitted disease (STD) testing.  Diabetes screening. This is done by checking your blood sugar (glucose) after you have not eaten for a while (fasting). You may have this done every 1-3 years.  Bone density scan. This is done to screen for osteoporosis. You may have this done starting at age 70.  Mammogram. This may be done every 1-2 years. Talk to your health care provider about how often you should have regular mammograms.  Talk with your health care provider about your test results, treatment options, and if necessary, the need for more tests. Vaccines Your health care provider may recommend certain vaccines, such as:  Influenza vaccine. This is recommended every year.  Tetanus, diphtheria, and acellular pertussis (Tdap, Td) vaccine. You may need a Td booster every 10 years.  Varicella vaccine. You may need this if you have not been vaccinated.  Zoster vaccine. You may need this after age 70.  Measles, mumps, and rubella (MMR) vaccine. You may need at least one dose of MMR if you were born in 1957 or later. You may also need a second dose.  Pneumococcal 13-valent conjugate (PCV13) vaccine. One dose is recommended after age 70.  Pneumococcal polysaccharide (PPSV23) vaccine. One dose is recommended after age 70.  Meningococcal vaccine. You may need this if you have certain conditions.  Hepatitis A vaccine. You may need this if you have certain conditions or if you travel or work in places where you may be exposed to hepatitis   A.  Hepatitis B vaccine. You may need this if you have certain conditions or if you travel or work in places where you may be exposed to hepatitis B.  Haemophilus influenzae type b (Hib) vaccine. You may need this if you have certain conditions.  Talk to your health care provider about which screenings and vaccines you need and how often you  need them. This information is not intended to replace advice given to you by your health care provider. Make sure you discuss any questions you have with your health care provider. Document Released: 10/16/2015 Document Revised: 06/08/2016 Document Reviewed: 07/21/2015 Elsevier Interactive Patient Education  2018 [employer]  

## 2018-02-12 NOTE — Progress Notes (Signed)
Subjective:     Laura Hopkins is a 72 y.o. female and is here for a comprehensive physical exam. The patient reports problems - cough with avapro,  pt also c/o depression-- she is not sure exactly what is causing it .  there is a lot going on at home.  .  Social History   Socioeconomic History  . Marital status: Married    Spouse name: Not on file  . Number of children: 2  . Years of education: Not on file  . Highest education level: Not on file  Occupational History  . 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 Financial resource strain: Not on file  . Food insecurity:    Worry: Not on file    Inability: Not on file  . Transportation needs:    Medical: Not on file    Non-medical: Not on file  Tobacco Use  . Smoking status: Former Smoker    Packs/day: 0.30    Last attempt to quit: 03/07/1999    Years since quitting: 18.9  . Smokeless tobacco: Never Used  Substance and Sexual Activity  . Alcohol use: No    Alcohol/week: 0.0 oz  . Drug use: No  . Sexual activity: Never    Partners: Male  Lifestyle  . Physical activity:    Days per week: Not on file    Minutes per session: Not on file  . Stress: Not on file  Relationships  . Social connections:    Talks on phone: Not on file    Gets together: Not on file    Attends religious service: Not on file    Active member of club or organization: Not on file    Attends meetings of clubs or organizations: Not on file    Relationship status: Not on file  . Intimate partner violence:    Fear of current or ex partner: Not on file    Emotionally abused: Not on file    Physically abused: Not on file    Forced sexual activity: Not on file  Other Topics Concern  . Not on file  Social History Narrative  . Not on file   Health Maintenance  Topic Date Due  . MAMMOGRAM  09/02/2017  . INFLUENZA VACCINE  05/03/2018  . TETANUS/TDAP  03/07/2019  . COLONOSCOPY  04/12/2026  . DEXA SCAN   Completed  . Hepatitis C Screening  Completed  . PNA vac Low Risk Adult  Completed    The following portions of the patient's history were reviewed and updated as appropriate:  She  has a past medical history of Arthritis, Headache(784.0), Hypertension, and Skin cancer. She does not have any pertinent problems on file. She  has a past surgical history that includes Abdominal hysterectomy (1995); Shoulder surgery (2000); Nasal septum surgery (1983); Colonoscopy (2007); Wisdom tooth extraction; Tubal ligation (1978); and Eye surgery (Bilateral, 03/03/2017). Her family history includes Diabetes in her father; Hypertension in her father and mother; Skin cancer in her brother. She  reports that she quit smoking about 18 years ago. She smoked 0.30 packs per day. She has never used smokeless tobacco. She reports that she does not drink alcohol or use drugs. She has a current medication list which includes the following prescription(s): alprazolam, estradiol, flax seed oil, glucosamine-chondroitin, multiple vitamins-minerals, fish oil, probiotic product, spironolactone, sumatriptan, bupropion, metoprolol succinate, and temazepam. Current Outpatient Medications on File Prior to Visit  Medication Sig Dispense  Refill  . ALPRAZolam (XANAX) 0.25 MG tablet Take 1 tablet by mouth 2 (two) times daily as needed.  1  . estradiol (VIVELLE-DOT) 0.05 MG/24HR Place 1 patch onto the skin once a week.      . Flaxseed, Linseed, (FLAX SEED OIL) 1000 MG CAPS Take 1 capsule by mouth daily.    . Glucosamine-Chondroitin (OSTEO BI-FLEX REGULAR STRENGTH PO) Take 1 tablet by mouth daily.     . Multiple Vitamins-Minerals (MULTIPLE VITAMINS/WOMENS PO) Take by mouth.    . Omega-3 Fatty Acids (FISH OIL) 1200 MG CAPS Take 1 capsule by mouth daily.    . Probiotic Product (PROBIOTIC DAILY PO) Take 1 capsule by mouth daily. [facility] colon [facility]    . spironolactone (ALDACTONE) 25 MG tablet TAKE 1 TABLET BY MOUTH EVERY DAY 90 tablet 0   . SUMAtriptan (IMITREX) 100 MG tablet TAKE 1 TABLET (100 MG TOTAL) BY MOUTH AS NEEDED. 10 tablet 2   No current facility-administered medications on file prior to visit.    She is allergic to avapro [irbesartan]; codeine; and norvasc [amlodipine]..  Review of Systems Review of Systems  Constitutional: Negative for activity change, appetite change and fatigue.  HENT: Negative for hearing loss, congestion, tinnitus and ear discharge.  dentist q63m Eyes: Negative for visual disturbance (see optho q1y -- vision corrected to 20/20 with glasses).  Respiratory: Negative for cough, chest tightness and shortness of breath.   Cardiovascular: Negative for chest pain, palpitations and leg swelling.  Gastrointestinal: Negative for abdominal pain, diarrhea, constipation and abdominal distention.  Genitourinary: Negative for urgency, frequency, decreased urine volume and difficulty urinating.  Musculoskeletal: Negative for back pain, arthralgias and gait problem.  Skin: Negative for color change, pallor and rash.  Neurological: Negative for dizziness, light-headedness, numbness and headaches.  Hematological: Negative for adenopathy. Does not bruise/bleed easily.  Psychiatric/Behavioral: Negative for suicidal ideas, confusion, sleep disturbance, self-injury, dysphoric mood, decreased concentration and agitation.       Objective:    BP 98/70 (BP Location: Left Arm, Cuff Size: Normal)   Pulse 64   Temp 98.2 F (36.8 C) (Oral)   Resp 16   Ht 5\' 4"  (1.626 m)   Wt 150 lb 6.4 oz (68.2 kg)   SpO2 95%   BMI 25.82 kg/m  General appearance: alert, cooperative, appears stated age and no distress Head: Normocephalic, without obvious abnormality, atraumatic Eyes: conjunctivae/corneas clear. PERRL, EOM's intact. Fundi benign. Ears: normal TM's and external ear canals both ears Nose: Nares normal. Septum midline. Mucosa normal. No drainage or sinus tenderness. Throat: lips, mucosa, and tongue normal;  teeth and gums normal Neck: no adenopathy, no carotid bruit, no JVD, supple, symmetrical, trachea midline and thyroid not enlarged, symmetric, no tenderness/mass/nodules Back: symmetric, no curvature. ROM normal. No CVA tenderness. Lungs: clear to auscultation bilaterally Breasts: gyn Heart: regular rate and rhythm, S1, S2 normal, no murmur, click, rub or gallop Abdomen: soft, non-tender; bowel sounds normal; no masses,  no organomegaly Pelvic: not indicated; status post hysterectomy, negative ROS Extremities: extremities normal, atraumatic, no cyanosis or edema Pulses: 2+ and symmetric Skin: Skin color, texture, turgor normal. No rashes or lesions Lymph nodes: Cervical, supraclavicular, and axillary nodes normal. Neurologic: Alert and oriented X 3, normal strength and tone. Normal symmetric reflexes. Normal coordination and gait    Assessment:    Healthy female exam.     Plan:    ghm utd Check labs  See After Visit Summary for Counseling Recommendations    1. Insomnia, unspecified type Stable  -  Pain Mgmt, Profile 8 w/Conf, U - temazepam (RESTORIL) 30 MG capsule; Take 1 capsule (30 mg total) by mouth at bedtime as needed for sleep.  Dispense: 30 capsule; Refill: 5  2. High risk medication use  - Pain Mgmt, Profile 8 w/Conf, U  3. Essential hypertension Well controlled, no changes to meds. Encouraged heart healthy diet such as the DASH diet and exercise as tolerated.   - metoprolol succinate (TOPROL-XL) 25 MG 24 hr tablet; Take 1 tablet (25 mg total) by mouth daily.  Dispense: 30 tablet; Refill: 3  4. Depression, major, single episode, mild ([place]) New problem Pt is not suicidal just down most of the time and fatigued  - buPROPion (WELLBUTRIN XL) 150 MG 24 hr tablet; Take 1 tablet (150 mg total) by mouth daily.  Dispense: 30 tablet; Refill: 2

## 2018-02-14 LAB — PAIN MGMT, PROFILE 8 W/CONF, U
6 Acetylmorphine: NEGATIVE ng/mL (ref <10)
ALCOHOL METABOLITES: NEGATIVE ng/mL (ref <500)
ALPHAHYDROXYMIDAZOLAM: NEGATIVE ng/mL (ref <50)
Alphahydroxyalprazolam: NEGATIVE ng/mL (ref <25)
Alphahydroxytriazolam: NEGATIVE ng/mL (ref <50)
Aminoclonazepam: NEGATIVE ng/mL (ref <25)
Amphetamines: NEGATIVE ng/mL (ref <500)
Benzodiazepines: POSITIVE ng/mL — AB (ref <100)
Buprenorphine, Urine: NEGATIVE ng/mL (ref <5)
COCAINE METABOLITE: NEGATIVE ng/mL (ref <150)
CREATININE: 228.4 mg/dL
Hydroxyethylflurazepam: NEGATIVE ng/mL (ref <50)
LORAZEPAM: NEGATIVE ng/mL (ref <50)
MDMA: NEGATIVE ng/mL (ref <500)
Marijuana Metabolite: NEGATIVE ng/mL (ref <20)
Nordiazepam: NEGATIVE ng/mL (ref <50)
Opiates: NEGATIVE ng/mL (ref <100)
Oxazepam: 4517 ng/mL — ABNORMAL HIGH (ref <50)
Oxidant: NEGATIVE ug/mL (ref <200)
Oxycodone: NEGATIVE ng/mL (ref <100)
Temazepam: >8000 ng/mL — ABNORMAL HIGH (ref <50)
pH: 6.01 (ref 4.5–9.0)

## 2018-02-27 ENCOUNTER — Other Ambulatory Visit: Payer: Self-pay | Admitting: Family Medicine

## 2018-02-27 DIAGNOSIS — R600 Localized edema: Secondary | ICD-10-CM

## 2018-02-27 DIAGNOSIS — I10 Essential (primary) hypertension: Secondary | ICD-10-CM

## 2018-02-27 NOTE — Telephone Encounter (Signed)
Refill 11/20/17 and last office visit 02/12/18. Follow up appt scheduled with Dr. Etter Sjogren on 08/14/18. Refill sent to pt's pharmacy.

## 2018-03-02 ENCOUNTER — Telehealth: Payer: Self-pay | Admitting: *Deleted

## 2018-03-02 NOTE — Telephone Encounter (Signed)
Received Medical records from [facility]; forwarded to provider/SLS 05/31

## 2018-03-06 ENCOUNTER — Ambulatory Visit (INDEPENDENT_AMBULATORY_CARE_PROVIDER_SITE_OTHER): Payer: Medicare HMO | Admitting: Family Medicine

## 2018-03-06 VITALS — BP 103/78 | HR 56

## 2018-03-06 DIAGNOSIS — I10 Essential (primary) hypertension: Secondary | ICD-10-CM | POA: Diagnosis not present

## 2018-03-06 NOTE — Progress Notes (Signed)
Pre visit review using our clinic tool,if applicable. No additional management support is needed unless otherwise documented below in the visit note.   Pt here for Blood pressure check per order from Dr. Roma Schanz.  Patient currently takes: Metoprolol Sus. 25 mg daily  (Has not has medication today)  Patient brought in readings from home which ranges from 113/82-122/78103/78   Pt reports compliance with medication.  BP today  = 103/78 HR = 56  Pt advised per Dr. Carollee Herter, patient to hold medication and recheck in 2 weeks. If problem before then call office. Patient agreed appointment scheduled.

## 2018-03-06 NOTE — Progress Notes (Signed)
Reviewed  Laura R Lowne Chase, DO  

## 2018-03-08 ENCOUNTER — Encounter: Payer: Self-pay | Admitting: Family Medicine

## 2018-03-21 ENCOUNTER — Ambulatory Visit (INDEPENDENT_AMBULATORY_CARE_PROVIDER_SITE_OTHER): Payer: Medicare HMO | Admitting: Family Medicine

## 2018-03-21 VITALS — BP 114/80 | HR 68

## 2018-03-21 DIAGNOSIS — I10 Essential (primary) hypertension: Secondary | ICD-10-CM

## 2018-03-21 NOTE — Progress Notes (Signed)
Noted. Agree with above. Does not appears she is on for reasons other than BP, OK to stop for now.    [place], DO 03/21/18 12:09 PM

## 2018-03-21 NOTE — Progress Notes (Signed)
Pre visit review using our clinic tool,if applicable. No additional management support is needed unless otherwise documented below in the visit note.   Patient in  For BP check per order dated 03/06/18 from Dr. Roma Schanz.  Patient advised to hold BP medications and return for BP check in 2 weeks.  Last BP = 103/78   P= 56  Patient has no complaints this visit. States she has not had BP medication.  BP today = 114/80  P= 68   Per Dr. Nani Ravens DOD patient to continue to hold BP medication (Metoprolol). Advised to check BP periodically and  call office if BP reads 140/90 and Pulse falls below 60. Patient agreed.

## 2018-05-22 DIAGNOSIS — R69 Illness, unspecified: Secondary | ICD-10-CM | POA: Diagnosis not present

## 2018-05-27 ENCOUNTER — Other Ambulatory Visit: Payer: Self-pay | Admitting: Family Medicine

## 2018-05-27 DIAGNOSIS — R600 Localized edema: Secondary | ICD-10-CM

## 2018-05-27 DIAGNOSIS — I10 Essential (primary) hypertension: Secondary | ICD-10-CM

## 2018-05-29 ENCOUNTER — Other Ambulatory Visit: Payer: Self-pay | Admitting: Family Medicine

## 2018-05-29 DIAGNOSIS — G43809 Other migraine, not intractable, without status migrainosus: Secondary | ICD-10-CM

## 2018-05-30 ENCOUNTER — Ambulatory Visit: Payer: Self-pay

## 2018-05-30 NOTE — Telephone Encounter (Signed)
Pt. Called to report she noticed her BP "creeping back up after My BP medication had been stopped. I put myself back on it and now I'm almost out." Reports having more frequent migraines as well. Migraine medication is helping. No other symptoms. BP today 134/85. Request OV.Appointment made for tomorrow.  Reason for Disposition . Systolic BP  >= 503 OR Diastolic >= 546  Answer Assessment - Initial Assessment Questions 1. BLOOD PRESSURE: "What is the blood pressure?" "Did you take at least two measurements 5 minutes apart?"     134/85 2. ONSET: "When did you take your blood pressure?"     This morning 3. HOW: "How did you obtain the blood pressure?" (e.g., visiting nurse, automatic home BP monitor)     Home BP monitor 4. HISTORY: "Do you have a history of high blood pressure?"     Yes 5. MEDICATIONS: "Are you taking any medications for blood pressure?" "Have you missed any doses recently?"     No missed 6. OTHER SYMPTOMS: "Do you have any symptoms?" (e.g., headache, chest pain, blurred vision, difficulty breathing, weakness)     Having more migraines 7. PREGNANCY: "Is there any chance you are pregnant?" "When was your last menstrual period?"     No  Protocols used: HIGH BLOOD PRESSURE-A-AH

## 2018-05-31 ENCOUNTER — Encounter: Payer: Self-pay | Admitting: Family Medicine

## 2018-05-31 ENCOUNTER — Ambulatory Visit (INDEPENDENT_AMBULATORY_CARE_PROVIDER_SITE_OTHER): Payer: Medicare HMO | Admitting: Family Medicine

## 2018-05-31 VITALS — BP 121/67 | HR 68 | Temp 98.1°F | Resp 16 | Ht 64.0 in | Wt 150.2 lb

## 2018-05-31 DIAGNOSIS — I10 Essential (primary) hypertension: Secondary | ICD-10-CM | POA: Diagnosis not present

## 2018-05-31 LAB — COMPREHENSIVE METABOLIC PANEL
ALT: 5 U/L (ref 0–35)
AST: 15 U/L (ref 0–37)
Albumin: 4.4 g/dL (ref 3.5–5.2)
Alkaline Phosphatase: 61 U/L (ref 39–117)
BILIRUBIN TOTAL: 1 mg/dL (ref 0.2–1.2)
BUN: 17 mg/dL (ref 6–23)
CHLORIDE: 101 meq/L (ref 96–112)
CO2: 29 meq/L (ref 19–32)
Calcium: 9.8 mg/dL (ref 8.4–10.5)
Creatinine, Ser: 0.96 mg/dL (ref 0.40–1.20)
GFR: 61.12 mL/min (ref >60.00)
GLUCOSE: 89 mg/dL (ref 70–99)
Potassium: 4.7 mEq/L (ref 3.5–5.1)
Sodium: 136 mEq/L (ref 135–145)
Total Protein: 6.9 g/dL (ref 6.0–8.3)

## 2018-05-31 LAB — LIPID PANEL
CHOL/HDL RATIO: 3
Cholesterol: 177 mg/dL (ref 0–200)
HDL: 59.4 mg/dL (ref >39.00)
LDL CALC: 96 mg/dL (ref 0–99)
NONHDL: 117.39
Triglycerides: 107 mg/dL (ref 0.0–149.0)
VLDL: 21.4 mg/dL (ref 0.0–40.0)

## 2018-05-31 MED ORDER — CARVEDILOL 3.125 MG PO TABS: 3.1250 mg | ORAL_TABLET | Freq: Two times a day (BID) | ORAL | 3 refills | Status: DC

## 2018-05-31 NOTE — Patient Instructions (Signed)

## 2018-05-31 NOTE — Progress Notes (Signed)
Patient ID: Laura Hopkins, female    DOB: June 28, 1946  Age: 72 y.o. MRN: [id number]    Subjective:  Subjective  HPI Laura Hopkins presents for bp check  She started metoprolol on her own because her bp was going up again-- but she does not like the cough and nasal congestion that comes with it--- she ran out of that and then took left over avapro but had the same side effects.     Review of Systems  Constitutional: Negative for chills and fever.  HENT: Negative for congestion and hearing loss.   Eyes: Negative for discharge.  Respiratory: Negative for cough and shortness of breath.   Cardiovascular: Negative for chest pain, palpitations and leg swelling.  Gastrointestinal: Negative for abdominal pain, blood in stool, constipation, diarrhea, nausea and vomiting.  Genitourinary: Negative for dysuria, frequency, hematuria and urgency.  Musculoskeletal: Negative for back pain and myalgias.  Skin: Negative for rash.  Allergic/Immunologic: Negative for environmental allergies.  Neurological: Negative for dizziness, weakness and headaches.  Hematological: Does not bruise/bleed easily.  Psychiatric/Behavioral: Negative for suicidal ideas. The patient is not nervous/anxious.     History Past Medical History:  Diagnosis Date  . Arthritis    hands and neck   . Headache(784.0)   . Hypertension   . Skin cancer    basal cell    She has a past surgical history that includes Abdominal hysterectomy (1995); Shoulder surgery (2000); Nasal septum surgery (1983); Colonoscopy (2007); Wisdom tooth extraction; Tubal ligation (1978); and Eye surgery (Bilateral, 03/03/2017).   Her family history includes Diabetes in her father; Hypertension in her father and mother; Skin cancer in her brother.She reports that she quit smoking about 19 years ago. She smoked 0.30 packs per day. She has never used smokeless tobacco. She reports that she does not drink alcohol or use drugs.  Current Outpatient Medications on  File Prior to Visit  Medication Sig Dispense Refill  . ALPRAZolam (XANAX) 0.25 MG tablet Take 1 tablet by mouth 2 (two) times daily as needed.  1  . estradiol (VIVELLE-DOT) 0.05 MG/24HR Place 1 patch onto the skin once a week.      . Flaxseed, Linseed, (FLAX SEED OIL) 1000 MG CAPS Take 1 capsule by mouth daily.    . Glucosamine-Chondroitin (OSTEO BI-FLEX REGULAR STRENGTH PO) Take 1 tablet by mouth daily.     . Multiple Vitamins-Minerals (MULTIPLE VITAMINS/WOMENS PO) Take by mouth.    . Omega-3 Fatty Acids (FISH OIL) 1200 MG CAPS Take 1 capsule by mouth daily.    . Probiotic Product (PROBIOTIC DAILY PO) Take 1 capsule by mouth daily. philips colon health    . spironolactone (ALDACTONE) 25 MG tablet TAKE 1 TABLET BY MOUTH EVERY DAY 90 tablet 0  . SUMAtriptan (IMITREX) 100 MG tablet TAKE 1 TABLET (100 MG TOTAL) BY MOUTH AS NEEDED. 10 tablet 2  . temazepam (RESTORIL) 30 MG capsule Take 1 capsule (30 mg total) by mouth at bedtime as needed for sleep. 30 capsule 5  . buPROPion (WELLBUTRIN XL) 150 MG 24 hr tablet Take 1 tablet (150 mg total) by mouth daily. (Patient not taking: Reported on 05/31/2018) 30 tablet 2   No current facility-administered medications on file prior to visit.      Objective:  Objective  Physical Exam  Constitutional: She is oriented to person, place, and time. She appears well-developed and well-nourished.  HENT:  Head: Normocephalic and atraumatic.  Eyes: Conjunctivae and EOM are normal.  Neck: Normal range of motion. Neck supple.  No JVD present. Carotid bruit is not present. No thyromegaly present.  Cardiovascular: Normal rate, regular rhythm and normal heart sounds.  No murmur heard. Pulmonary/Chest: Effort normal and breath sounds normal. No respiratory distress. She has no wheezes. She has no rales. She exhibits no tenderness.  Musculoskeletal: She exhibits no edema.  Neurological: She is alert and oriented to person, place, and time.  Psychiatric: She has a normal  mood and affect.  Nursing note and vitals reviewed.  BP 121/67 (BP Location: Left Arm, Cuff Size: Large)   Pulse 68   Temp 98.1 F (36.7 C) (Oral)   Resp 16   Ht 5\' 4"  (1.626 m)   Wt 150 lb 3.2 oz (68.1 kg)   SpO2 98%   BMI 25.78 kg/m  Wt Readings from Last 3 Encounters:  05/31/18 150 lb 3.2 oz (68.1 kg)  02/12/18 150 lb 6.4 oz (68.2 kg)  12/22/17 151 lb 6.4 oz (68.7 kg)     Lab Results  Component Value Date   WBC 6.6 12/22/2017   HGB 13.7 12/22/2017   HCT 41.1 12/22/2017   PLT 258.0 12/22/2017   GLUCOSE 92 12/29/2017   CHOL 158 12/22/2017   TRIG 88.0 12/22/2017   HDL 61.80 12/22/2017   LDLCALC 79 12/22/2017   ALT 7 12/22/2017   AST 18 12/22/2017   NA 139 12/29/2017   K 4.1 12/29/2017   CL 104 12/29/2017   CREATININE 0.98 12/29/2017   BUN 14 12/29/2017   CO2 28 12/29/2017   TSH 1.21 03/07/2011    Mr Cervical Spine Wo Contrast  Result Date: 10/11/2010 Clinical Data: Posterior neck with right shoulder and arm pain for 6 months.  No known injury or prior relevant surgery.  MRI CERVICAL SPINE WITHOUT CONTRAST  Technique:  Multiplanar and multiecho pulse sequences of the cervical spine, to include the craniocervical junction and cervicothoracic junction, were obtained according to standard protocol without intravenous contrast.  Comparison: None.  Findings: The alignment is satisfactory.  There is no evidence of fracture or paraspinal ligamentous injury.  The craniocervical junction appears normal.  The cervical cord is normal in signal and caliber.  The spinal canal is adequately patent at all levels.  C2-C3:  There is moderate asymmetric facet hypertrophy on the left resulting in mild narrowing of the left foramen.  No nerve root encroachment is demonstrated.  C3-C4:  There are left greater than right facet degenerative changes with a resulting minimal anterolisthesis.  There is mild biforaminal stenosis without definite C4 nerve root encroachment.  C4-C5:  There is more  symmetric facet hypertrophy with minimal disc bulging.  The right foramen is mildly narrowed.  There is no definite nerve root encroachment.  C5-C6:  Small central disc protrusion and mild bilateral facet hypertrophy.  No significant foraminal stenosis or nerve root encroachment.  C6-C7:  Disc height and hydration are maintained.  There is no spinal stenosis or nerve root encroachment.  C7-T1:  Facet degenerative changes are asymmetric to the left. There is no foraminal stenosis or nerve root encroachment.  IMPRESSION:  1.  Multilevel cervical facet arthropathy, generally worse on the left and most advanced at C2-C3 and C3-C4.  This facet disease may certainly contribute to neck pain. 2.  No high-grade foraminal stenosis or nerve root encroachment. 3.  Small central disc protrusion at C5-C6.  No large disc herniation or central stenosis. Provider: Reesa Chew  Mr Extrem Up Jt*r* W/o Cm  Result Date: 10/11/2010 Clinical Data: Neck pain radiating to the  right shoulder and arm for 6 months.  No acute injury or prior relevant surgery.  MRI OF THE RIGHT SHOULDER WITHOUT CONTRAST  Technique:  Multiplanar, multisequence MR imaging of the right shoulder was performed.  No intravenous contrast was administered.  Comparison:  None.  Findings:  There is no significant shoulder joint effusion.  A small amount of fluid is present anteriorly in the subacromial - subdeltoid bursa.  The anterior leading edge of the distal supraspinatus tendon is attenuated with partial tearing.  No definite full-thickness tendon tear, tendon retraction or muscular atrophy is identified.  The infraspinatus tendon demonstrates mild to moderate tendinosis, but no tear.  The subscapularis tendon demonstrates mild tendinosis, but no tear.  There is degenerative subchondral cyst formation inferiorly in the glenoid.  There is degeneration of the posterior labrum without well-defined tear.  The intra-articular portion of the biceps tendon is  mildly degenerated, but intact.  The humeral head articular cartilage appears preserved.  The acromion is type 1.  There are moderate acromioclavicular degenerative changes with mild subacromial spurring.  IMPRESSION:  1.  Partial insertional tear of the supraspinatus tendon as described.  No definite full-thickness rotator cuff tear. 2.  Infraspinatus, subscapularis and biceps tendinosis without tear. 3.  Glenohumeral degenerative changes with inferior glenoid subchondral cyst formation and posterior labral degeneration. Provider: [place]:  Plan  I have discontinued Jeanett Stober's metoprolol succinate. I am also having her start on carvedilol. Additionally, I am having her maintain her estradiol, Glucosamine-Chondroitin (OSTEO BI-FLEX REGULAR STRENGTH PO), ALPRAZolam, Probiotic Product (PROBIOTIC DAILY PO), Fish Oil, Flax Seed Oil, Multiple Vitamins-Minerals (MULTIPLE VITAMINS/WOMENS PO), temazepam, buPROPion, spironolactone, and SUMAtriptan.  Meds ordered this encounter  Medications  . carvedilol (COREG) 3.125 MG tablet    Sig: Take 1 tablet (3.125 mg total) by mouth 2 (two) times daily with a meal.    Dispense:  60 tablet    Refill:  3    Problem List Items Addressed This Visit      Unprioritized   Essential hypertension - Primary   Relevant Medications   carvedilol (COREG) 3.125 MG tablet   Other Relevant Orders   Lipid panel   Comprehensive metabolic panel   Lipid panel   Comprehensive metabolic panel          Start coreg -- f/u 2-3 weeks or sooner prn   Follow-up: Return in about 2 weeks (around 06/14/2018), or bp check.  Ann Held, DO

## 2018-06-05 ENCOUNTER — Telehealth: Payer: Self-pay | Admitting: Family Medicine

## 2018-06-05 NOTE — Telephone Encounter (Signed)
Copied from [place] [id number]. Topic: General - Other >> Jun 05, 2018  8:51 AM Yvette Rack wrote: Reason for CRM: pt calling for lab results

## 2018-06-05 NOTE — Telephone Encounter (Signed)
Charted in result notes. 

## 2018-06-08 ENCOUNTER — Other Ambulatory Visit: Payer: Self-pay | Admitting: Family Medicine

## 2018-06-08 DIAGNOSIS — I10 Essential (primary) hypertension: Secondary | ICD-10-CM

## 2018-06-12 DIAGNOSIS — D225 Melanocytic nevi of trunk: Secondary | ICD-10-CM | POA: Diagnosis not present

## 2018-06-12 DIAGNOSIS — L821 Other seborrheic keratosis: Secondary | ICD-10-CM | POA: Diagnosis not present

## 2018-06-12 DIAGNOSIS — L570 Actinic keratosis: Secondary | ICD-10-CM | POA: Diagnosis not present

## 2018-06-12 DIAGNOSIS — Z85828 Personal history of other malignant neoplasm of skin: Secondary | ICD-10-CM | POA: Diagnosis not present

## 2018-06-14 ENCOUNTER — Ambulatory Visit (INDEPENDENT_AMBULATORY_CARE_PROVIDER_SITE_OTHER): Payer: Medicare HMO | Admitting: Family Medicine

## 2018-06-14 ENCOUNTER — Encounter: Payer: Self-pay | Admitting: Family Medicine

## 2018-06-14 VITALS — BP 118/82 | HR 84 | Temp 98.0°F | Ht 64.0 in | Wt 149.4 lb

## 2018-06-14 DIAGNOSIS — R51 Headache: Secondary | ICD-10-CM

## 2018-06-14 DIAGNOSIS — R519 Headache, unspecified: Secondary | ICD-10-CM

## 2018-06-14 MED ORDER — PROMETHAZINE HCL 25 MG PO TABS: 25.0000 mg | ORAL_TABLET | Freq: Three times a day (TID) | ORAL | 0 refills | Status: DC | PRN

## 2018-06-14 MED ORDER — KETOROLAC TROMETHAMINE 30 MG/ML IJ SOLN
30.0000 mg | Freq: Once | INTRAMUSCULAR | Status: AC
  Administered 2018-06-14: 30 mg via INTRAMUSCULAR

## 2018-06-14 NOTE — Progress Notes (Signed)
Chief Complaint  Patient presents with  . Migraine    for 6 days     Laura Hopkins is a 72 y.o. female here for evaluation of an acute headache.  Duration: 6 days Laterality: left Quality: electrical feeling Severity: 8/10 Associated symptoms: some neck pain Therapies tried: Imitrex Hx of migraines: yes.  ROS:  Neuro: +HA MSK: +neck pain  Past Medical History:  Diagnosis Date  . Arthritis    hands and neck   . Headache(784.0)   . Hypertension   . Skin cancer    basal cell   BP 118/82 (BP Location: Left Arm, Patient Position: Sitting, Cuff Size: Normal)   Pulse 84   Temp 98 F (36.7 C) (Oral)   Ht 5\' 4"  (1.626 m)   Wt 149 lb 6 oz (67.8 kg)   SpO2 97%   BMI 25.64 kg/m  Gen: awake, alert, appearing stated age Eyes: PERRLA, EOMi, no injection Heart: RRR Lungs: CTAB, no accessory muscle use Abd: BS+, soft, NT, ND Neuro: CN2-12 grossly intact, fluent and goal-oriented speech, DTR's equal and symmetric in UE's and LE's MSK: 5/5 strength throughout, no TTP over cervical paraspinal musculature, +ttp over occipital triangle region Psych: Age appropriate judgment and insight, normal affect and mood  Acute nonintractable headache, unspecified headache type - Plan: promethazine (PHENERGAN) 25 MG tablet  Toradol today, phenergan for nausea. Heat. Home stretches/exercises for neck. No NSAIDs for rest of day. Tylenol OK.  F/u prn. The pt voiced understanding and agreement to the plan.  [place], DO 06/14/18 4:11 PM

## 2018-06-14 NOTE — Patient Instructions (Signed)
No anti-inflammatories for the rest of the day.  OK to take Tylenol 1000 mg (2 extra strength tabs) or 975 mg (3 regular strength tabs) every 6 hours as needed.  Heat (pad or rice pillow in microwave) over affected area, 10-15 minutes twice daily.   Let us know if you need anything.   EXERCISES RANGE OF MOTION (ROM) AND STRETCHING EXERCISES  These exercises may help you when beginning to rehabilitate your issue. In order to successfully resolve your symptoms, you must improve your posture. These exercises are designed to help reduce the forward-head and rounded-shoulder posture which contributes to this condition. Your symptoms may resolve with or without further involvement from your physician, physical therapist or athletic trainer. While completing these exercises, remember:   Restoring tissue flexibility helps normal motion to return to the joints. This allows healthier, less painful movement and activity.  An effective stretch should be held for at least 20 seconds, although you may need to begin with shorter hold times for comfort.  A stretch should never be painful. You should only feel a gentle lengthening or release in the stretched tissue.  Do not do any stretch or exercise that you cannot tolerate.  STRETCH- Axial Extensors  Lie on your back on the floor. You may bend your knees for comfort. Place a rolled-up hand towel or dish towel, about 2 inches in diameter, under the part of your head that makes contact with the floor.  Gently tuck your chin, as if trying to make a "double chin," until you feel a gentle stretch at the base of your head.  Hold 15-20 seconds. Repeat 2-3 times. Complete this exercise 1 time per day.   STRETCH - Axial Extension   Stand or sit on a firm surface. Assume a good posture: chest up, shoulders drawn back, abdominal muscles slightly tense, knees unlocked (if standing) and feet hip width apart.  Slowly retract your chin so your head slides back  and your chin slightly lowers. Continue to look straight ahead.  You should feel a gentle stretch in the back of your head. Be certain not to feel an aggressive stretch since this can cause headaches later.  Hold for 15-20 seconds. Repeat 2-3 times. Complete this exercise 1 time per day.  STRETCH - Cervical Side Bend   Stand or sit on a firm surface. Assume a good posture: chest up, shoulders drawn back, abdominal muscles slightly tense, knees unlocked (if standing) and feet hip width apart.  Without letting your nose or shoulders move, slowly tip your right / left ear to your shoulder until your feel a gentle stretch in the muscles on the opposite side of your neck.  Hold 15-20 seconds. Repeat 2-3 times. Complete this exercise 1-2 times per day.  STRETCH - Cervical Rotators   Stand or sit on a firm surface. Assume a good posture: chest up, shoulders drawn back, abdominal muscles slightly tense, knees unlocked (if standing) and feet hip width apart.  Keeping your eyes level with the ground, slowly turn your head until you feel a gentle stretch along the back and opposite side of your neck.  Hold 15-20 seconds. Repeat 2-3 times. Complete this exercise 1-2 times per day.  RANGE OF MOTION - Neck Circles   Stand or sit on a firm surface. Assume a good posture: chest up, shoulders drawn back, abdominal muscles slightly tense, knees unlocked (if standing) and feet hip width apart.  Gently roll your head down and around from the back of  one shoulder to the back of the other. The motion should never be forced or painful.  Repeat the motion 10-20 times, or until you feel the neck muscles relax and loosen. Repeat 2-3 times. Complete the exercise 1-2 times per day. STRENGTHENING EXERCISES - Cervical Strain and Sprain These exercises may help you when beginning to rehabilitate your injury. They may resolve your symptoms with or without further involvement from your physician, physical  therapist, or athletic trainer. While completing these exercises, remember:   Muscles can gain both the endurance and the strength needed for everyday activities through controlled exercises.  Complete these exercises as instructed by your physician, physical therapist, or athletic trainer. Progress the resistance and repetitions only as guided.  You may experience muscle soreness or fatigue, but the pain or discomfort you are trying to eliminate should never worsen during these exercises. If this pain does worsen, stop and make certain you are following the directions exactly. If the pain is still present after adjustments, discontinue the exercise until you can discuss the trouble with your clinician.  STRENGTH - Cervical Flexors, Isometric  Face a wall, standing about 6 inches away. Place a small pillow, a ball about 6-8 inches in diameter, or a folded towel between your forehead and the wall.  Slightly tuck your chin and gently push your forehead into the soft object. Push only with mild to moderate intensity, building up tension gradually. Keep your jaw and forehead relaxed.  Hold 10 to 20 seconds. Keep your breathing relaxed.  Release the tension slowly. Relax your neck muscles completely before you start the next repetition. Repeat 2-3 times. Complete this exercise 1 time per day.  STRENGTH- Cervical Lateral Flexors, Isometric   Stand about 6 inches away from a wall. Place a small pillow, a ball about 6-8 inches in diameter, or a folded towel between the side of your head and the wall.  Slightly tuck your chin and gently tilt your head into the soft object. Push only with mild to moderate intensity, building up tension gradually. Keep your jaw and forehead relaxed.  Hold 10 to 20 seconds. Keep your breathing relaxed.  Release the tension slowly. Relax your neck muscles completely before you start the next repetition. Repeat 2-3 times. Complete this exercise 1 time per  day.  STRENGTH - Cervical Extensors, Isometric   Stand about 6 inches away from a wall. Place a small pillow, a ball about 6-8 inches in diameter, or a folded towel between the back of your head and the wall.  Slightly tuck your chin and gently tilt your head back into the soft object. Push only with mild to moderate intensity, building up tension gradually. Keep your jaw and forehead relaxed.  Hold 10 to 20 seconds. Keep your breathing relaxed.  Release the tension slowly. Relax your neck muscles completely before you start the next repetition. Repeat 2-3 times. Complete this exercise 1 time per day.  POSTURE AND BODY MECHANICS CONSIDERATIONS Keeping correct posture when sitting, standing or completing your activities will reduce the stress put on different body tissues, allowing injured tissues a chance to heal and limiting painful experiences. The following are general guidelines for improved posture. Your physician or physical therapist will provide you with any instructions specific to your needs. While reading these guidelines, remember:  The exercises prescribed by your provider will help you have the flexibility and strength to maintain correct postures.  The correct posture provides the optimal environment for your joints to work. All of  your joints have less wear and tear when properly supported by a spine with good posture. This means you will experience a healthier, less painful body.  Correct posture must be practiced with all of your activities, especially prolonged sitting and standing. Correct posture is as important when doing repetitive low-stress activities (typing) as it is when doing a single heavy-load activity (lifting).  PROLONGED STANDING WHILE SLIGHTLY LEANING FORWARD When completing a task that requires you to lean forward while standing in one place for a long time, place either foot up on a stationary 2- to 4-inch high object to help maintain the best posture. When  both feet are on the ground, the low back tends to lose its slight inward curve. If this curve flattens (or becomes too large), then the back and your other joints will experience too much stress, fatigue more quickly, and can cause pain.   RESTING POSITIONS Consider which positions are most painful for you when choosing a resting position. If you have pain with flexion-based activities (sitting, bending, stooping, squatting), choose a position that allows you to rest in a less flexed posture. You would want to avoid curling into a fetal position on your side. If your pain worsens with extension-based activities (prolonged standing, working overhead), avoid resting in an extended position such as sleeping on your stomach. Most people will find more comfort when they rest with their spine in a more neutral position, neither too rounded nor too arched. Lying on a non-sagging bed on your side with a pillow between your knees, or on your back with a pillow under your knees will often provide some relief. Keep in mind, being in any one position for a prolonged period of time, no matter how correct your posture, can still lead to stiffness.  WALKING Walk with an upright posture. Your ears, shoulders, and hips should all line up. OFFICE WORK When working at a desk, create an environment that supports good, upright posture. Without extra support, muscles fatigue and lead to excessive strain on joints and other tissues.  CHAIR:  A chair should be able to slide under your desk when your back makes contact with the back of the chair. This allows you to work closely.  The chair's height should allow your eyes to be level with the upper part of your monitor and your hands to be slightly lower than your elbows.  Body position: ? Your feet should make contact with the floor. If this is not possible, use a foot rest. ? Keep your ears over your shoulders. This will reduce stress on your neck and low back.

## 2018-06-14 NOTE — Progress Notes (Signed)
Pre visit review using our clinic review tool, if applicable. No additional management support is needed unless otherwise documented below in the visit note. 

## 2018-06-14 NOTE — Addendum Note (Signed)
Addended by: Sharon Seller B on: 06/14/2018 04:24 PM   Modules accepted: Orders

## 2018-06-21 ENCOUNTER — Ambulatory Visit (INDEPENDENT_AMBULATORY_CARE_PROVIDER_SITE_OTHER): Payer: Medicare HMO | Admitting: Family Medicine

## 2018-06-21 VITALS — BP 147/91 | HR 65

## 2018-06-21 DIAGNOSIS — I10 Essential (primary) hypertension: Secondary | ICD-10-CM

## 2018-06-21 NOTE — Progress Notes (Signed)
Pre visit review using our clinic tool,if applicable. No additional management support is needed unless otherwise documented below in the visit note.   Pt here for Blood pressure check per order dated 05/31/18.   Pt currently takes:No medications  Patient states she came in to see another provider because she was having migraine headaches. States she was nauseous and could not keep anything on her stomach so she stopped taking all her medications.   Home BP machine = 147/96                              P= 64        BP today @ = 147/91 P =65    Pt advised per Dr. Carollee Herter to re-start medications and return in 2-3 weeks for BP check. Patient agreed and appointment scheduled.

## 2018-06-21 NOTE — Progress Notes (Signed)
Reviewed  Yvonne R Lowne Chase, DO  

## 2018-07-12 ENCOUNTER — Ambulatory Visit: Payer: Medicare HMO

## 2018-07-20 ENCOUNTER — Other Ambulatory Visit: Payer: Self-pay | Admitting: Family Medicine

## 2018-07-20 DIAGNOSIS — R69 Illness, unspecified: Secondary | ICD-10-CM | POA: Diagnosis not present

## 2018-07-20 DIAGNOSIS — I10 Essential (primary) hypertension: Secondary | ICD-10-CM

## 2018-08-14 ENCOUNTER — Ambulatory Visit (INDEPENDENT_AMBULATORY_CARE_PROVIDER_SITE_OTHER): Payer: Medicare HMO | Admitting: Family Medicine

## 2018-08-14 ENCOUNTER — Encounter: Payer: Self-pay | Admitting: Family Medicine

## 2018-08-14 VITALS — BP 142/79 | HR 59 | Temp 97.8°F | Resp 16 | Ht 64.0 in | Wt 150.8 lb

## 2018-08-14 DIAGNOSIS — I10 Essential (primary) hypertension: Secondary | ICD-10-CM

## 2018-08-14 DIAGNOSIS — G4700 Insomnia, unspecified: Secondary | ICD-10-CM

## 2018-08-14 DIAGNOSIS — G43809 Other migraine, not intractable, without status migrainosus: Secondary | ICD-10-CM

## 2018-08-14 MED ORDER — SUMATRIPTAN SUCCINATE 100 MG PO TABS: 100.0000 mg | ORAL_TABLET | ORAL | 2 refills | Status: DC | PRN

## 2018-08-14 MED ORDER — TEMAZEPAM 30 MG PO CAPS: 30.0000 mg | ORAL_CAPSULE | Freq: Every evening | ORAL | 5 refills | Status: DC | PRN

## 2018-08-14 NOTE — Patient Instructions (Signed)
DASH Eating Plan DASH stands for "Dietary Approaches to Stop Hypertension." The DASH eating plan is a healthy eating plan that has been shown to reduce high blood pressure (hypertension). It may also reduce your risk for type 2 diabetes, heart disease, and stroke. The DASH eating plan may also help with weight loss. What are tips for following this plan? General guidelines  Avoid eating more than 2,300 mg (milligrams) of salt (sodium) a day. If you have hypertension, you may need to reduce your sodium intake to 1,500 mg a day.  Limit alcohol intake to no more than 1 drink a day for nonpregnant women and 2 drinks a day for men. One drink equals 12 oz of beer, 5 oz of wine, or 1 oz of hard liquor.  Work with your health care provider to maintain a healthy body weight or to lose weight. Ask what an ideal weight is for you.  Get at least 30 minutes of exercise that causes your heart to beat faster (aerobic exercise) most days of the week. Activities may include walking, swimming, or biking.  Work with your health care provider or diet and nutrition specialist (dietitian) to adjust your eating plan to your individual calorie needs. Reading food labels  Check food labels for the amount of sodium per serving. Choose foods with less than 5 percent of the Daily Value of sodium. Generally, foods with less than 300 mg of sodium per serving fit into this eating plan.  To find whole grains, look for the word "whole" as the first word in the ingredient list. Shopping  Buy products labeled as "low-sodium" or "no salt added."  Buy fresh foods. Avoid canned foods and premade or frozen meals. Cooking  Avoid adding salt when cooking. Use salt-free seasonings or herbs instead of table salt or sea salt. Check with your health care provider or pharmacist before using salt substitutes.  Do not fry foods. Cook foods using healthy methods such as baking, boiling, grilling, and broiling instead.  Cook with  heart-healthy oils, such as olive, canola, soybean, or sunflower oil. Meal planning   Eat a balanced diet that includes: ? 5 or more servings of fruits and vegetables each day. At each meal, try to fill half of your plate with fruits and vegetables. ? Up to 6-8 servings of whole grains each day. ? Less than 6 oz of lean meat, poultry, or fish each day. A 3-oz serving of meat is about the same size as a deck of cards. One egg equals 1 oz. ? 2 servings of low-fat dairy each day. ? A serving of nuts, seeds, or beans 5 times each week. ? Heart-healthy fats. Healthy fats called Omega-3 fatty acids are found in foods such as flaxseeds and coldwater fish, like sardines, salmon, and mackerel.  Limit how much you eat of the following: ? Canned or prepackaged foods. ? Food that is high in trans fat, such as fried foods. ? Food that is high in saturated fat, such as fatty meat. ? Sweets, desserts, sugary drinks, and other foods with added sugar. ? Full-fat dairy products.  Do not salt foods before eating.  Try to eat at least 2 vegetarian meals each week.  Eat more home-cooked food and less restaurant, buffet, and fast food.  When eating at a restaurant, ask that your food be prepared with less salt or no salt, if possible. What foods are recommended? The items listed may not be a complete list. Talk with your dietitian about what   dietary choices are best for you. Grains Whole-grain or whole-wheat bread. Whole-grain or whole-wheat pasta. Brown rice. Oatmeal. Quinoa. Bulgur. Whole-grain and low-sodium cereals. Pita bread. Low-fat, low-sodium crackers. Whole-wheat flour tortillas. Vegetables Fresh or frozen vegetables (raw, steamed, roasted, or grilled). Low-sodium or reduced-sodium tomato and vegetable juice. Low-sodium or reduced-sodium tomato sauce and tomato paste. Low-sodium or reduced-sodium canned vegetables. Fruits All fresh, dried, or frozen fruit. Canned fruit in natural juice (without  added sugar). Meat and other protein foods Skinless chicken or turkey. Ground chicken or turkey. Pork with fat trimmed off. Fish and seafood. Egg whites. Dried beans, peas, or lentils. Unsalted nuts, nut butters, and seeds. Unsalted canned beans. Lean cuts of beef with fat trimmed off. Low-sodium, lean deli meat. Dairy Low-fat (1%) or fat-free (skim) milk. Fat-free, low-fat, or reduced-fat cheeses. Nonfat, low-sodium ricotta or cottage cheese. Low-fat or nonfat yogurt. Low-fat, low-sodium cheese. Fats and oils Soft margarine without trans fats. Vegetable oil. Low-fat, reduced-fat, or light mayonnaise and salad dressings (reduced-sodium). Canola, safflower, olive, soybean, and sunflower oils. Avocado. Seasoning and other foods Herbs. Spices. Seasoning mixes without salt. Unsalted popcorn and pretzels. Fat-free sweets. What foods are not recommended? The items listed may not be a complete list. Talk with your dietitian about what dietary choices are best for you. Grains Baked goods made with fat, such as croissants, muffins, or some breads. Dry pasta or rice meal packs. Vegetables Creamed or fried vegetables. Vegetables in a cheese sauce. Regular canned vegetables (not low-sodium or reduced-sodium). Regular canned tomato sauce and paste (not low-sodium or reduced-sodium). Regular tomato and vegetable juice (not low-sodium or reduced-sodium). Pickles. Olives. Fruits Canned fruit in a light or heavy syrup. Fried fruit. Fruit in cream or butter sauce. Meat and other protein foods Fatty cuts of meat. Ribs. Fried meat. Bacon. Sausage. Bologna and other processed lunch meats. Salami. Fatback. Hotdogs. Bratwurst. Salted nuts and seeds. Canned beans with added salt. Canned or smoked fish. Whole eggs or egg yolks. Chicken or turkey with skin. Dairy Whole or 2% milk, cream, and half-and-half. Whole or full-fat cream cheese. Whole-fat or sweetened yogurt. Full-fat cheese. Nondairy creamers. Whipped toppings.  Processed cheese and cheese spreads. Fats and oils Butter. Stick margarine. Lard. Shortening. Ghee. Bacon fat. Tropical oils, such as coconut, palm kernel, or palm oil. Seasoning and other foods Salted popcorn and pretzels. Onion salt, garlic salt, seasoned salt, table salt, and sea salt. Worcestershire sauce. Tartar sauce. Barbecue sauce. Teriyaki sauce. Soy sauce, including reduced-sodium. Steak sauce. Canned and packaged gravies. Fish sauce. Oyster sauce. Cocktail sauce. Horseradish that you find on the shelf. Ketchup. Mustard. Meat flavorings and tenderizers. Bouillon cubes. Hot sauce and Tabasco sauce. Premade or packaged marinades. Premade or packaged taco seasonings. Relishes. Regular salad dressings. Where to find more information:  National Heart, Lung, and Blood Institute: www.nhlbi.nih.gov  American Heart Association: www.heart.org Summary  The DASH eating plan is a healthy eating plan that has been shown to reduce high blood pressure (hypertension). It may also reduce your risk for type 2 diabetes, heart disease, and stroke.  With the DASH eating plan, you should limit salt (sodium) intake to 2,300 mg a day. If you have hypertension, you may need to reduce your sodium intake to 1,500 mg a day.  When on the DASH eating plan, aim to eat more fresh fruits and vegetables, whole grains, lean proteins, low-fat dairy, and heart-healthy fats.  Work with your health care provider or diet and nutrition specialist (dietitian) to adjust your eating plan to your individual   calorie needs. This information is not intended to replace advice given to you by your health care provider. Make sure you discuss any questions you have with your health care provider. Document Released: 09/08/2011 Document Revised: 09/12/2016 Document Reviewed: 09/12/2016 Elsevier Interactive Patient Education  2018 [employer]  

## 2018-08-14 NOTE — Progress Notes (Signed)
Patient ID: Laura Hopkins, female    DOB: Feb 24, 1946  Age: 72 y.o. MRN: [id number]    Subjective:  Subjective  HPI Laura Hopkins presents for f/u bp , migraines and med refill.  No new complaints.   Review of Systems  Constitutional: Negative for appetite change, diaphoresis, fatigue and unexpected weight change.  Eyes: Negative for pain, redness and visual disturbance.  Respiratory: Negative for cough, chest tightness, shortness of breath and wheezing.   Cardiovascular: Negative for chest pain, palpitations and leg swelling.  Endocrine: Negative for cold intolerance, heat intolerance, polydipsia, polyphagia and polyuria.  Genitourinary: Negative for difficulty urinating, dysuria and frequency.  Neurological: Negative for dizziness, light-headedness, numbness and headaches.    History Past Medical History:  Diagnosis Date  . Arthritis    hands and neck   . Headache(784.0)   . Hypertension   . Skin cancer    basal cell    She has a past surgical history that includes Abdominal hysterectomy (1995); Shoulder surgery (2000); Nasal septum surgery (1983); Colonoscopy (2007); Wisdom tooth extraction; Tubal ligation (1978); and Eye surgery (Bilateral, 03/03/2017).   Her family history includes Diabetes in her father; Hypertension in her father and mother; Skin cancer in her brother.She reports that she quit smoking about 19 years ago. She smoked 0.30 packs per day. She has never used smokeless tobacco. She reports that she does not drink alcohol or use drugs.  Current Outpatient Medications on File Prior to Visit  Medication Sig Dispense Refill  . ALPRAZolam (XANAX) 0.25 MG tablet Take 1 tablet by mouth 2 (two) times daily as needed.  1  . carvedilol (COREG) 3.125 MG tablet TAKE 1 TABLET (3.125 MG TOTAL) BY MOUTH 2 (TWO) TIMES DAILY WITH A MEAL. 180 tablet 1  . estradiol (VIVELLE-DOT) 0.05 MG/24HR Place 1 patch onto the skin once a week.      . Flaxseed, Linseed, (FLAX SEED OIL) 1000 MG  CAPS Take 1 capsule by mouth daily.    . Glucosamine-Chondroitin (OSTEO BI-FLEX REGULAR STRENGTH PO) Take 1 tablet by mouth daily.     . Multiple Vitamins-Minerals (MULTIPLE VITAMINS/WOMENS PO) Take by mouth.    . Omega-3 Fatty Acids (FISH OIL) 1200 MG CAPS Take 1 capsule by mouth daily.    . Probiotic Product (PROBIOTIC DAILY PO) Take 1 capsule by mouth daily. philips colon health    . promethazine (PHENERGAN) 25 MG tablet Take 1 tablet (25 mg total) by mouth every 8 (eight) hours as needed for nausea or vomiting. 20 tablet 0   No current facility-administered medications on file prior to visit.      Objective:  Objective  Physical Exam  Constitutional: She is oriented to person, place, and time. She appears well-developed and well-nourished.  HENT:  Head: Normocephalic and atraumatic.  Eyes: Conjunctivae and EOM are normal.  Neck: Normal range of motion. Neck supple. No JVD present. Carotid bruit is not present. No thyromegaly present.  Cardiovascular: Normal rate, regular rhythm and normal heart sounds.  No murmur heard. Pulmonary/Chest: Effort normal and breath sounds normal. No respiratory distress. She has no wheezes. She has no rales. She exhibits no tenderness.  Musculoskeletal: She exhibits no edema.  Neurological: She is alert and oriented to person, place, and time.  Psychiatric: She has a normal mood and affect.  Nursing note and vitals reviewed.  BP (!) 142/79   Pulse (!) 59   Temp 97.8 F (36.6 C) (Oral)   Resp 16   Ht 5\' 4"  (1.626 m)  Wt 150 lb 12.8 oz (68.4 kg)   SpO2 100%   BMI 25.88 kg/m  Wt Readings from Last 3 Encounters:  08/14/18 150 lb 12.8 oz (68.4 kg)  06/14/18 149 lb 6 oz (67.8 kg)  05/31/18 150 lb 3.2 oz (68.1 kg)     Lab Results  Component Value Date   WBC 6.6 12/22/2017   HGB 13.7 12/22/2017   HCT 41.1 12/22/2017   PLT 258.0 12/22/2017   GLUCOSE 89 05/31/2018   CHOL 177 05/31/2018   TRIG 107.0 05/31/2018   HDL 59.40 05/31/2018    LDLCALC 96 05/31/2018   ALT 5 05/31/2018   AST 15 05/31/2018   NA 136 05/31/2018   K 4.7 05/31/2018   CL 101 05/31/2018   CREATININE 0.96 05/31/2018   BUN 17 05/31/2018   CO2 29 05/31/2018   TSH 1.21 03/07/2011    Mr Cervical Spine Wo Contrast  Result Date: 10/11/2010 Clinical Data: Posterior neck with right shoulder and arm pain for 6 months.  No known injury or prior relevant surgery.  MRI CERVICAL SPINE WITHOUT CONTRAST  Technique:  Multiplanar and multiecho pulse sequences of the cervical spine, to include the craniocervical junction and cervicothoracic junction, were obtained according to standard protocol without intravenous contrast.  Comparison: None.  Findings: The alignment is satisfactory.  There is no evidence of fracture or paraspinal ligamentous injury.  The craniocervical junction appears normal.  The cervical cord is normal in signal and caliber.  The spinal canal is adequately patent at all levels.  C2-C3:  There is moderate asymmetric facet hypertrophy on the left resulting in mild narrowing of the left foramen.  No nerve root encroachment is demonstrated.  C3-C4:  There are left greater than right facet degenerative changes with a resulting minimal anterolisthesis.  There is mild biforaminal stenosis without definite C4 nerve root encroachment.  C4-C5:  There is more symmetric facet hypertrophy with minimal disc bulging.  The right foramen is mildly narrowed.  There is no definite nerve root encroachment.  C5-C6:  Small central disc protrusion and mild bilateral facet hypertrophy.  No significant foraminal stenosis or nerve root encroachment.  C6-C7:  Disc height and hydration are maintained.  There is no spinal stenosis or nerve root encroachment.  C7-T1:  Facet degenerative changes are asymmetric to the left. There is no foraminal stenosis or nerve root encroachment.  IMPRESSION:  1.  Multilevel cervical facet arthropathy, generally worse on the left and most advanced at C2-C3 and  C3-C4.  This facet disease may certainly contribute to neck pain. 2.  No high-grade foraminal stenosis or nerve root encroachment. 3.  Small central disc protrusion at C5-C6.  No large disc herniation or central stenosis. Provider: Reesa Chew  Mr Extrem Up Jt*r* W/o Cm  Result Date: 10/11/2010 Clinical Data: Neck pain radiating to the right shoulder and arm for 6 months.  No acute injury or prior relevant surgery.  MRI OF THE RIGHT SHOULDER WITHOUT CONTRAST  Technique:  Multiplanar, multisequence MR imaging of the right shoulder was performed.  No intravenous contrast was administered.  Comparison:  None.  Findings:  There is no significant shoulder joint effusion.  A small amount of fluid is present anteriorly in the subacromial - subdeltoid bursa.  The anterior leading edge of the distal supraspinatus tendon is attenuated with partial tearing.  No definite full-thickness tendon tear, tendon retraction or muscular atrophy is identified.  The infraspinatus tendon demonstrates mild to moderate tendinosis, but no tear.  The subscapularis tendon  demonstrates mild tendinosis, but no tear.  There is degenerative subchondral cyst formation inferiorly in the glenoid.  There is degeneration of the posterior labrum without well-defined tear.  The intra-articular portion of the biceps tendon is mildly degenerated, but intact.  The humeral head articular cartilage appears preserved.  The acromion is type 1.  There are moderate acromioclavicular degenerative changes with mild subacromial spurring.  IMPRESSION:  1.  Partial insertional tear of the supraspinatus tendon as described.  No definite full-thickness rotator cuff tear. 2.  Infraspinatus, subscapularis and biceps tendinosis without tear. 3.  Glenohumeral degenerative changes with inferior glenoid subchondral cyst formation and posterior labral degeneration. Provider: [place]:  Plan  I have discontinued Hassan Rowan Allbee's  buPROPion, spironolactone, and metoprolol succinate. I am also having her maintain her estradiol, Glucosamine-Chondroitin (OSTEO BI-FLEX REGULAR STRENGTH PO), ALPRAZolam, Probiotic Product (PROBIOTIC DAILY PO), Fish Oil, Flax Seed Oil, Multiple Vitamins-Minerals (MULTIPLE VITAMINS/WOMENS PO), promethazine, carvedilol, SUMAtriptan, and temazepam.  Meds ordered this encounter  Medications  . SUMAtriptan (IMITREX) 100 MG tablet    Sig: Take 1 tablet (100 mg total) by mouth as needed.    Dispense:  10 tablet    Refill:  2  . temazepam (RESTORIL) 30 MG capsule    Sig: Take 1 capsule (30 mg total) by mouth at bedtime as needed for sleep.    Dispense:  30 capsule    Refill:  5    Problem List Items Addressed This Visit      Unprioritized   Essential hypertension    Well controlled, no changes to meds. Encouraged heart healthy diet such as the DASH diet and exercise as tolerated.        Insomnia - Primary   Relevant Medications   temazepam (RESTORIL) 30 MG capsule   Migraine    Stable Refill imitrex      Relevant Medications   SUMAtriptan (IMITREX) 100 MG tablet      Follow-up: Return in about 6 months (around 02/12/2019) for annual exam, fasting.  Ann Held, DO

## 2018-08-15 NOTE — Assessment & Plan Note (Signed)
Stable Refill imitrex

## 2018-08-15 NOTE — Assessment & Plan Note (Signed)
Well controlled, no changes to meds. Encouraged heart healthy diet such as the DASH diet and exercise as tolerated.  °

## 2018-08-20 ENCOUNTER — Other Ambulatory Visit: Payer: Self-pay | Admitting: Family Medicine

## 2018-08-20 DIAGNOSIS — I10 Essential (primary) hypertension: Secondary | ICD-10-CM

## 2018-08-20 DIAGNOSIS — R600 Localized edema: Secondary | ICD-10-CM

## 2018-10-11 DIAGNOSIS — H524 Presbyopia: Secondary | ICD-10-CM | POA: Diagnosis not present

## 2018-11-21 DIAGNOSIS — M8588 Other specified disorders of bone density and structure, other site: Secondary | ICD-10-CM | POA: Diagnosis not present

## 2018-11-21 DIAGNOSIS — N958 Other specified menopausal and perimenopausal disorders: Secondary | ICD-10-CM | POA: Diagnosis not present

## 2018-11-21 DIAGNOSIS — Z6826 Body mass index (BMI) 26.0-26.9, adult: Secondary | ICD-10-CM | POA: Diagnosis not present

## 2018-11-21 DIAGNOSIS — Z01419 Encounter for gynecological examination (general) (routine) without abnormal findings: Secondary | ICD-10-CM | POA: Diagnosis not present

## 2018-11-21 DIAGNOSIS — Z1231 Encounter for screening mammogram for malignant neoplasm of breast: Secondary | ICD-10-CM | POA: Diagnosis not present

## 2018-11-21 DIAGNOSIS — N951 Menopausal and female climacteric states: Secondary | ICD-10-CM | POA: Diagnosis not present

## 2018-11-21 LAB — HM MAMMOGRAPHY

## 2018-11-28 ENCOUNTER — Telehealth: Payer: Self-pay | Admitting: *Deleted

## 2018-11-28 NOTE — Telephone Encounter (Signed)
Received Medical records from [facility]; forwarded to provider/SLS 02/26

## 2018-12-11 ENCOUNTER — Telehealth: Payer: Self-pay | Admitting: *Deleted

## 2018-12-11 ENCOUNTER — Other Ambulatory Visit: Payer: Self-pay | Admitting: Family Medicine

## 2018-12-11 DIAGNOSIS — G43809 Other migraine, not intractable, without status migrainosus: Secondary | ICD-10-CM

## 2018-12-11 NOTE — Telephone Encounter (Signed)
Copied from [place] [id number]. Topic: General - Other >> Dec 11, 2018 10:15 AM Laura Hopkins wrote:  Pt said she told that if she keep having bp issues that she would need to see a specialist. She call to say she still having bp issues

## 2018-12-12 DIAGNOSIS — R69 Illness, unspecified: Secondary | ICD-10-CM | POA: Diagnosis not present

## 2018-12-14 ENCOUNTER — Encounter: Payer: Self-pay | Admitting: Family Medicine

## 2018-12-14 NOTE — Telephone Encounter (Signed)
Needs ov here ----  Morning app please

## 2018-12-14 NOTE — Telephone Encounter (Signed)
Please advise 

## 2018-12-14 NOTE — Telephone Encounter (Signed)
LMOM informing Pt to return call. Okay for PEC to discuss.  

## 2018-12-17 NOTE — Telephone Encounter (Signed)
Appt scheduled 12/18/2018.

## 2018-12-18 ENCOUNTER — Ambulatory Visit (INDEPENDENT_AMBULATORY_CARE_PROVIDER_SITE_OTHER): Payer: Medicare HMO | Admitting: Family Medicine

## 2018-12-18 ENCOUNTER — Encounter: Payer: Self-pay | Admitting: Family Medicine

## 2018-12-18 ENCOUNTER — Other Ambulatory Visit: Payer: Self-pay

## 2018-12-18 VITALS — BP 142/96 | HR 65 | Temp 97.8°F | Resp 12 | Ht 64.0 in | Wt 151.6 lb

## 2018-12-18 DIAGNOSIS — I10 Essential (primary) hypertension: Secondary | ICD-10-CM | POA: Diagnosis not present

## 2018-12-18 MED ORDER — CHLORTHALIDONE 25 MG PO TABS: 25.0000 mg | ORAL_TABLET | Freq: Every day | ORAL | 2 refills | Status: DC

## 2018-12-18 NOTE — Patient Instructions (Signed)
DASH Eating Plan  DASH stands for "Dietary Approaches to Stop Hypertension." The DASH eating plan is a healthy eating plan that has been shown to reduce high blood pressure (hypertension). It may also reduce your risk for type 2 diabetes, heart disease, and stroke. The DASH eating plan may also help with weight loss.  What are tips for following this plan?    General guidelines   Avoid eating more than 2,300 mg (milligrams) of salt (sodium) a day. If you have hypertension, you may need to reduce your sodium intake to 1,500 mg a day.   Limit alcohol intake to no more than 1 drink a day for nonpregnant women and 2 drinks a day for men. One drink equals 12 oz of beer, 5 oz of wine, or 1 oz of hard liquor.   Work with your health care provider to maintain a healthy body weight or to lose weight. Ask what an ideal weight is for you.   Get at least 30 minutes of exercise that causes your heart to beat faster (aerobic exercise) most days of the week. Activities may include walking, swimming, or biking.   Work with your health care provider or diet and nutrition specialist (dietitian) to adjust your eating plan to your individual calorie needs.  Reading food labels     Check food labels for the amount of sodium per serving. Choose foods with less than 5 percent of the Daily Value of sodium. Generally, foods with less than 300 mg of sodium per serving fit into this eating plan.   To find whole grains, look for the word "whole" as the first word in the ingredient list.  Shopping   Buy products labeled as "low-sodium" or "no salt added."   Buy fresh foods. Avoid canned foods and premade or frozen meals.  Cooking   Avoid adding salt when cooking. Use salt-free seasonings or herbs instead of table salt or sea salt. Check with your health care provider or pharmacist before using salt substitutes.   Do not fry foods. Cook foods using healthy methods such as baking, boiling, grilling, and broiling instead.   Cook with  heart-healthy oils, such as olive, canola, soybean, or sunflower oil.  Meal planning   Eat a balanced diet that includes:  ? 5 or more servings of fruits and vegetables each day. At each meal, try to fill half of your plate with fruits and vegetables.  ? Up to 6-8 servings of whole grains each day.  ? Less than 6 oz of lean meat, poultry, or fish each day. A 3-oz serving of meat is about the same size as a deck of cards. One egg equals 1 oz.  ? 2 servings of low-fat dairy each day.  ? A serving of nuts, seeds, or beans 5 times each week.  ? Heart-healthy fats. Healthy fats called Omega-3 fatty acids are found in foods such as flaxseeds and coldwater fish, like sardines, salmon, and mackerel.   Limit how much you eat of the following:  ? Canned or prepackaged foods.  ? Food that is high in trans fat, such as fried foods.  ? Food that is high in saturated fat, such as fatty meat.  ? Sweets, desserts, sugary drinks, and other foods with added sugar.  ? Full-fat dairy products.   Do not salt foods before eating.   Try to eat at least 2 vegetarian meals each week.   Eat more home-cooked food and less restaurant, buffet, and fast food.     When eating at a restaurant, ask that your food be prepared with less salt or no salt, if possible.  What foods are recommended?  The items listed may not be a complete list. Talk with your dietitian about what dietary choices are best for you.  Grains  Whole-grain or whole-wheat bread. Whole-grain or whole-wheat pasta. Brown rice. Oatmeal. Quinoa. Bulgur. Whole-grain and low-sodium cereals. Pita bread. Low-fat, low-sodium crackers. Whole-wheat flour tortillas.  Vegetables  Fresh or frozen vegetables (raw, steamed, roasted, or grilled). Low-sodium or reduced-sodium tomato and vegetable juice. Low-sodium or reduced-sodium tomato sauce and tomato paste. Low-sodium or reduced-sodium canned vegetables.  Fruits  All fresh, dried, or frozen fruit. Canned fruit in natural juice (without  added sugar).  Meat and other protein foods  Skinless chicken or turkey. Ground chicken or turkey. Pork with fat trimmed off. Fish and seafood. Egg whites. Dried beans, peas, or lentils. Unsalted nuts, nut butters, and seeds. Unsalted canned beans. Lean cuts of beef with fat trimmed off. Low-sodium, lean deli meat.  Dairy  Low-fat (1%) or fat-free (skim) milk. Fat-free, low-fat, or reduced-fat cheeses. Nonfat, low-sodium ricotta or cottage cheese. Low-fat or nonfat yogurt. Low-fat, low-sodium cheese.  Fats and oils  Soft margarine without trans fats. Vegetable oil. Low-fat, reduced-fat, or light mayonnaise and salad dressings (reduced-sodium). Canola, safflower, olive, soybean, and sunflower oils. Avocado.  Seasoning and other foods  Herbs. Spices. Seasoning mixes without salt. Unsalted popcorn and pretzels. Fat-free sweets.  What foods are not recommended?  The items listed may not be a complete list. Talk with your dietitian about what dietary choices are best for you.  Grains  Baked goods made with fat, such as croissants, muffins, or some breads. Dry pasta or rice meal packs.  Vegetables  Creamed or fried vegetables. Vegetables in a cheese sauce. Regular canned vegetables (not low-sodium or reduced-sodium). Regular canned tomato sauce and paste (not low-sodium or reduced-sodium). Regular tomato and vegetable juice (not low-sodium or reduced-sodium). Pickles. Olives.  Fruits  Canned fruit in a light or heavy syrup. Fried fruit. Fruit in cream or butter sauce.  Meat and other protein foods  Fatty cuts of meat. Ribs. Fried meat. Bacon. Sausage. Bologna and other processed lunch meats. Salami. Fatback. Hotdogs. Bratwurst. Salted nuts and seeds. Canned beans with added salt. Canned or smoked fish. Whole eggs or egg yolks. Chicken or turkey with skin.  Dairy  Whole or 2% milk, cream, and half-and-half. Whole or full-fat cream cheese. Whole-fat or sweetened yogurt. Full-fat cheese. Nondairy creamers. Whipped toppings.  Processed cheese and cheese spreads.  Fats and oils  Butter. Stick margarine. Lard. Shortening. Ghee. Bacon fat. Tropical oils, such as coconut, palm kernel, or palm oil.  Seasoning and other foods  Salted popcorn and pretzels. Onion salt, garlic salt, seasoned salt, table salt, and sea salt. Worcestershire sauce. Tartar sauce. Barbecue sauce. Teriyaki sauce. Soy sauce, including reduced-sodium. Steak sauce. Canned and packaged gravies. Fish sauce. Oyster sauce. Cocktail sauce. Horseradish that you find on the shelf. Ketchup. Mustard. Meat flavorings and tenderizers. Bouillon cubes. Hot sauce and Tabasco sauce. Premade or packaged marinades. Premade or packaged taco seasonings. Relishes. Regular salad dressings.  Where to find more information:   National Heart, Lung, and Blood Institute: www.nhlbi.nih.gov   American Heart Association: www.heart.org  Summary   The DASH eating plan is a healthy eating plan that has been shown to reduce high blood pressure (hypertension). It may also reduce your risk for type 2 diabetes, heart disease, and stroke.   With the   DASH eating plan, you should limit salt (sodium) intake to 2,300 mg a day. If you have hypertension, you may need to reduce your sodium intake to 1,500 mg a day.   When on the DASH eating plan, aim to eat more fresh fruits and vegetables, whole grains, lean proteins, low-fat dairy, and heart-healthy fats.   Work with your health care provider or diet and nutrition specialist (dietitian) to adjust your eating plan to your individual calorie needs.  This information is not intended to replace advice given to you by your health care provider. Make sure you discuss any questions you have with your health care provider.  Document Released: 09/08/2011 Document Revised: 09/12/2016 Document Reviewed: 09/12/2016  Elsevier Interactive Patient Education  2019 [employer] Inc[employer]

## 2018-12-18 NOTE — Progress Notes (Signed)
Patient ID: Laura Hopkins, female    DOB: Mar 17, 1946  Age: 73 y.o. MRN: [id number]    Subjective:  Subjective  HPI Laura Hopkins presents for side effects to her bp med.  She states the coreg caused muscle aches and cough.  When she stopped it to goes away and her bp is running high.  She has had some headaches but no cp or sob.   Review of Systems  Constitutional: Negative for appetite change, diaphoresis, fatigue and unexpected weight change.  Eyes: Negative for pain, redness and visual disturbance.  Respiratory: Negative for cough, chest tightness, shortness of breath and wheezing.   Cardiovascular: Negative for chest pain, palpitations and leg swelling.  Endocrine: Negative for cold intolerance, heat intolerance, polydipsia, polyphagia and polyuria.  Genitourinary: Negative for difficulty urinating, dysuria and frequency.  Neurological: Positive for headaches. Negative for dizziness, light-headedness and numbness.    History Past Medical History:  Diagnosis Date   Arthritis    hands and neck    Headache(784.0)    Hypertension    Skin cancer    basal cell    She has a past surgical history that includes Abdominal hysterectomy (1995); Shoulder surgery (2000); Nasal septum surgery (1983); Colonoscopy (2007); Wisdom tooth extraction; Tubal ligation (1978); and Eye surgery (Bilateral, 03/03/2017).   Her family history includes Diabetes in her father; Hypertension in her father and mother; Skin cancer in her brother.She reports that she quit smoking about 19 years ago. She smoked 0.30 packs per day. She has never used smokeless tobacco. She reports that she does not drink alcohol or use drugs.  Current Outpatient Medications on File Prior to Visit  Medication Sig Dispense Refill   ALPRAZolam (XANAX) 0.25 MG tablet Take 1 tablet by mouth 2 (two) times daily as needed.  1   Calcium Carbonate-Vit D-Min (CALCIUM 1200 PO) Take 1 tablet by mouth daily.     carvedilol (COREG) 3.125 MG  tablet TAKE 1 TABLET (3.125 MG TOTAL) BY MOUTH 2 (TWO) TIMES DAILY WITH A MEAL. 180 tablet 1   estradiol (VIVELLE-DOT) 0.05 MG/24HR Place 1 patch onto the skin once a week.       Flaxseed, Linseed, (FLAX SEED OIL) 1000 MG CAPS Take 1 capsule by mouth daily.     Glucosamine-Chondroitin (OSTEO BI-FLEX REGULAR STRENGTH PO) Take 1 tablet by mouth daily.      Multiple Vitamins-Minerals (MULTIPLE VITAMINS/WOMENS PO) Take by mouth.     Omega-3 Fatty Acids (FISH OIL) 1200 MG CAPS Take 1 capsule by mouth daily.     Probiotic Product (PROBIOTIC DAILY PO) Take 1 capsule by mouth daily. philips colon health     promethazine (PHENERGAN) 25 MG tablet Take 1 tablet (25 mg total) by mouth every 8 (eight) hours as needed for nausea or vomiting. 20 tablet 0   SUMAtriptan (IMITREX) 100 MG tablet TAKE 1 TABLET (100 MG TOTAL) BY MOUTH AS NEEDED.MAX ON INSURANCE 9 tablet 3   temazepam (RESTORIL) 30 MG capsule Take 1 capsule (30 mg total) by mouth at bedtime as needed for sleep. 30 capsule 5   VITAMIN D PO Take 1,000 mg by mouth daily.     No current facility-administered medications on file prior to visit.      Objective:  Objective  Physical Exam Vitals signs and nursing note reviewed.  Constitutional:      Appearance: She is well-developed.  HENT:     Head: Normocephalic and atraumatic.  Eyes:     Conjunctiva/sclera: Conjunctivae normal.  Neck:  Musculoskeletal: Normal range of motion and neck supple.     Thyroid: No thyromegaly.     Vascular: No carotid bruit or JVD.  Cardiovascular:     Rate and Rhythm: Normal rate and regular rhythm.     Heart sounds: Normal heart sounds. No murmur.  Pulmonary:     Effort: Pulmonary effort is normal. No respiratory distress.     Breath sounds: Normal breath sounds. No wheezing or rales.  Chest:     Chest wall: No tenderness.  Neurological:     Mental Status: She is alert and oriented to person, place, and time.    BP (!) 142/96 (BP Location: Left  Arm)    Pulse 65    Temp 97.8 F (36.6 C) (Oral)    Resp 12    Ht 5\' 4"  (1.626 m)    Wt 151 lb 9.6 oz (68.8 kg)    SpO2 98%    BMI 26.02 kg/m  Wt Readings from Last 3 Encounters:  12/18/18 151 lb 9.6 oz (68.8 kg)  08/14/18 150 lb 12.8 oz (68.4 kg)  06/14/18 149 lb 6 oz (67.8 kg)     Lab Results  Component Value Date   WBC 6.6 12/22/2017   HGB 13.7 12/22/2017   HCT 41.1 12/22/2017   PLT 258.0 12/22/2017   GLUCOSE 89 05/31/2018   CHOL 177 05/31/2018   TRIG 107.0 05/31/2018   HDL 59.40 05/31/2018   LDLCALC 96 05/31/2018   ALT 5 05/31/2018   AST 15 05/31/2018   NA 136 05/31/2018   K 4.7 05/31/2018   CL 101 05/31/2018   CREATININE 0.96 05/31/2018   BUN 17 05/31/2018   CO2 29 05/31/2018   TSH 1.21 03/07/2011    Mr Cervical Spine Wo Contrast  Result Date: 10/11/2010 Clinical Data: Posterior neck with right shoulder and arm pain for 6 months.  No known injury or prior relevant surgery.  MRI CERVICAL SPINE WITHOUT CONTRAST  Technique:  Multiplanar and multiecho pulse sequences of the cervical spine, to include the craniocervical junction and cervicothoracic junction, were obtained according to standard protocol without intravenous contrast.  Comparison: None.  Findings: The alignment is satisfactory.  There is no evidence of fracture or paraspinal ligamentous injury.  The craniocervical junction appears normal.  The cervical cord is normal in signal and caliber.  The spinal canal is adequately patent at all levels.  C2-C3:  There is moderate asymmetric facet hypertrophy on the left resulting in mild narrowing of the left foramen.  No nerve root encroachment is demonstrated.  C3-C4:  There are left greater than right facet degenerative changes with a resulting minimal anterolisthesis.  There is mild biforaminal stenosis without definite C4 nerve root encroachment.  C4-C5:  There is more symmetric facet hypertrophy with minimal disc bulging.  The right foramen is mildly narrowed.  There is no  definite nerve root encroachment.  C5-C6:  Small central disc protrusion and mild bilateral facet hypertrophy.  No significant foraminal stenosis or nerve root encroachment.  C6-C7:  Disc height and hydration are maintained.  There is no spinal stenosis or nerve root encroachment.  C7-T1:  Facet degenerative changes are asymmetric to the left. There is no foraminal stenosis or nerve root encroachment.  IMPRESSION:  1.  Multilevel cervical facet arthropathy, generally worse on the left and most advanced at C2-C3 and C3-C4.  This facet disease may certainly contribute to neck pain. 2.  No high-grade foraminal stenosis or nerve root encroachment. 3.  Small central disc  protrusion at C5-C6.  No large disc herniation or central stenosis. Provider: Reesa Chew  Mr Extrem Up Jt*r* W/o Cm  Result Date: 10/11/2010 Clinical Data: Neck pain radiating to the right shoulder and arm for 6 months.  No acute injury or prior relevant surgery.  MRI OF THE RIGHT SHOULDER WITHOUT CONTRAST  Technique:  Multiplanar, multisequence MR imaging of the right shoulder was performed.  No intravenous contrast was administered.  Comparison:  None.  Findings:  There is no significant shoulder joint effusion.  A small amount of fluid is present anteriorly in the subacromial - subdeltoid bursa.  The anterior leading edge of the distal supraspinatus tendon is attenuated with partial tearing.  No definite full-thickness tendon tear, tendon retraction or muscular atrophy is identified.  The infraspinatus tendon demonstrates mild to moderate tendinosis, but no tear.  The subscapularis tendon demonstrates mild tendinosis, but no tear.  There is degenerative subchondral cyst formation inferiorly in the glenoid.  There is degeneration of the posterior labrum without well-defined tear.  The intra-articular portion of the biceps tendon is mildly degenerated, but intact.  The humeral head articular cartilage appears preserved.  The acromion is type  1.  There are moderate acromioclavicular degenerative changes with mild subacromial spurring.  IMPRESSION:  1.  Partial insertional tear of the supraspinatus tendon as described.  No definite full-thickness rotator cuff tear. 2.  Infraspinatus, subscapularis and biceps tendinosis without tear. 3.  Glenohumeral degenerative changes with inferior glenoid subchondral cyst formation and posterior labral degeneration. Provider: [place]:  Plan  I have discontinued Laura Hopkins's spironolactone. I am also having her start on chlorthalidone. Additionally, I am having her maintain her estradiol, Glucosamine-Chondroitin (OSTEO BI-FLEX REGULAR STRENGTH PO), ALPRAZolam, Probiotic Product (PROBIOTIC DAILY PO), Fish Oil, Flax Seed Oil, Multiple Vitamins-Minerals (MULTIPLE VITAMINS/WOMENS PO), promethazine, carvedilol, temazepam, SUMAtriptan, VITAMIN D PO, and Calcium Carbonate-Vit D-Min (CALCIUM 1200 PO).  Meds ordered this encounter  Medications   chlorthalidone (HYGROTON) 25 MG tablet    Sig: Take 1 tablet (25 mg total) by mouth daily.    Dispense:  30 tablet    Refill:  2    Problem List Items Addressed This Visit    None    Visit Diagnoses    Hypertension, unspecified type    -  Primary   Relevant Medications   chlorthalidone (HYGROTON) 25 MG tablet   Other Relevant Orders   Ambulatory referral to Cardiology    pt has had reactions to several medications --- will try chlorthalidone and refer to cardiology  Recheck 2-3 weeks or sooner prn   Follow-up: Return in about 2 weeks (around 01/01/2019), or bp check.  Ann Held, DO

## 2019-01-09 ENCOUNTER — Telehealth: Payer: Self-pay | Admitting: Cardiology

## 2019-01-09 NOTE — Telephone Encounter (Signed)
error 

## 2019-01-28 NOTE — Telephone Encounter (Signed)
error 

## 2019-02-12 ENCOUNTER — Telehealth: Payer: Self-pay

## 2019-02-12 NOTE — Telephone Encounter (Signed)
Spoke with patient and switched her appt to a 6 mth instead of a physical. She will have a telephone visit instead

## 2019-02-12 NOTE — Telephone Encounter (Signed)
Left message on home phone for patient to call back regarding appointment. Pt is scheduled for a CPE and her insurance will not cover a virtual CPE. Asked to change to follow up virtual appointment.

## 2019-02-14 ENCOUNTER — Telehealth: Payer: Self-pay

## 2019-02-14 NOTE — Telephone Encounter (Signed)
Copied from [place]. Topic: Quick Communication - Appointment Cancellation >> Feb 14, 2019 10:36 AM Rutherford Nail, NT wrote: Patient called to cancel appointment scheduled for 02/15/2019. Patient has NOT rescheduled their appointment.  Patient states that she received a call and that the appointment that was originally scheduled for 02/15/2019 was cancelled and moved to 02/14/2019. Spoke with office and was advised to tell her that the appointment day was never changed, it was the visit type that was changed. Patient refuses to accept that and states that she distinctly remembers questioning whether the appointment was on Friday on Thursday, and was told Thursday at 9:30. Patient states that she does not have time to do an appointment now, and will call back to reschedule at a later date. Did not have time to hold for office to cancel appointment.  Route to department's PEC pool.

## 2019-02-15 ENCOUNTER — Encounter: Payer: Self-pay | Admitting: Family Medicine

## 2019-02-15 ENCOUNTER — Ambulatory Visit (INDEPENDENT_AMBULATORY_CARE_PROVIDER_SITE_OTHER): Payer: Medicare HMO | Admitting: Family Medicine

## 2019-02-15 ENCOUNTER — Other Ambulatory Visit: Payer: Self-pay

## 2019-02-15 DIAGNOSIS — G43809 Other migraine, not intractable, without status migrainosus: Secondary | ICD-10-CM | POA: Diagnosis not present

## 2019-02-15 DIAGNOSIS — Z79899 Other long term (current) drug therapy: Secondary | ICD-10-CM | POA: Diagnosis not present

## 2019-02-15 DIAGNOSIS — I10 Essential (primary) hypertension: Secondary | ICD-10-CM | POA: Diagnosis not present

## 2019-02-15 MED ORDER — SUMATRIPTAN SUCCINATE 100 MG PO TABS: ORAL_TABLET | ORAL | 3 refills | Status: DC

## 2019-02-15 MED ORDER — CHLORTHALIDONE 25 MG PO TABS: 25.0000 mg | ORAL_TABLET | Freq: Every day | ORAL | 2 refills | Status: DC

## 2019-02-15 NOTE — Progress Notes (Signed)
.Virtual Visit via Video Note  I connected with Laura Hopkins on 02/15/19 at  9:30 AM EDT by a video enabled telemedicine application and verified that I am speaking with the correct person using two identifiers. Unable to do video call--had to do tele visit  Location: Patient: home Provider: office   I discussed the limitations of evaluation and management by telemedicine and the availability of in person appointments. The patient expressed understanding and agreed to proceed.  History of Present Illness: Pt is home with no complaints She needs refills and will come in for labs    Past Medical History:  Diagnosis Date  . Arthritis    hands and neck   . Headache(784.0)   . Hypertension   . Skin cancer    basal cell   Outpatient Encounter Medications as of 02/15/2019  Medication Sig Note  . ALPRAZolam (XANAX) 0.25 MG tablet Take 1 tablet by mouth 2 (two) times daily as needed. 10/02/2015: Received from: External Pharmacy Received Sig: TAKE ONE TABLET BY MOUTH TWICE DAILY FOR ANXIETY  . Calcium Carbonate-Vit D-Min (CALCIUM 1200 PO) Take 1 tablet by mouth daily.   . carvedilol (COREG) 3.125 MG tablet TAKE 1 TABLET (3.125 MG TOTAL) BY MOUTH 2 (TWO) TIMES DAILY WITH A MEAL.   . chlorthalidone (HYGROTON) 25 MG tablet Take 1 tablet (25 mg total) by mouth daily.   Marland Kitchen estradiol (VIVELLE-DOT) 0.05 MG/24HR Place 1 patch onto the skin once a week.     . Flaxseed, Linseed, (FLAX SEED OIL) 1000 MG CAPS Take 1 capsule by mouth daily.   . Glucosamine-Chondroitin (OSTEO BI-FLEX REGULAR STRENGTH PO) Take 1 tablet by mouth daily.    . Multiple Vitamins-Minerals (MULTIPLE VITAMINS/WOMENS PO) Take by mouth.   . Omega-3 Fatty Acids (FISH OIL) 1200 MG CAPS Take 1 capsule by mouth daily.   . Probiotic Product (PROBIOTIC DAILY PO) Take 1 capsule by mouth daily. philips colon health   . promethazine (PHENERGAN) 25 MG tablet Take 1 tablet (25 mg total) by mouth every 8 (eight) hours as needed for nausea or  vomiting.   . SUMAtriptan (IMITREX) 100 MG tablet TAKE 1 TABLET (100 MG TOTAL) BY MOUTH AS NEEDED.MAX ON INSURANCE   . temazepam (RESTORIL) 30 MG capsule Take 1 capsule (30 mg total) by mouth at bedtime as needed for sleep.   Marland Kitchen VITAMIN D PO Take 1,000 mg by mouth daily.   . [DISCONTINUED] chlorthalidone (HYGROTON) 25 MG tablet Take 1 tablet (25 mg total) by mouth daily.   . [DISCONTINUED] SUMAtriptan (IMITREX) 100 MG tablet TAKE 1 TABLET (100 MG TOTAL) BY MOUTH AS NEEDED.MAX ON INSURANCE    No facility-administered encounter medications on file as of 02/15/2019.     Observations/Objective: 122/84 p63  Afebrile Pt in NAD  Assessment and Plan: 1. Hypertension, unspecified type Well controlled, no changes to meds. Encouraged heart healthy diet such as the DASH diet and exercise as tolerated.   - chlorthalidone (HYGROTON) 25 MG tablet; Take 1 tablet (25 mg total) by mouth daily.  Dispense: 30 tablet; Refill: 2  2. Other migraine without status migrainosus, not intractable .stable con't med - SUMAtriptan (IMITREX) 100 MG tablet; TAKE 1 TABLET (100 MG TOTAL) BY MOUTH AS NEEDED.MAX ON INSURANCE  Dispense: 10 tablet; Refill: 3   Follow Up Instructions:    I discussed the assessment and treatment plan with the patient. The patient was provided an opportunity to ask questions and all were answered. The patient agreed with the plan and demonstrated  an understanding of the instructions.   The patient was advised to call back or seek an in-person evaluation if the symptoms worsen or if the condition fails to improve as anticipated.  I provided 20 minutes of non-face-to-face time during this encounter.   Ann Held, DO

## 2019-02-15 NOTE — Assessment & Plan Note (Signed)
Well controlled, no changes to meds. Encouraged heart healthy diet such as the DASH diet and exercise as tolerated.  °

## 2019-02-15 NOTE — Assessment & Plan Note (Signed)
Refill med  Stable

## 2019-03-08 NOTE — Progress Notes (Signed)
Cardiology Office Note:    Date:  03/11/2019   ID:  Laura Hopkins, DOB [birth year], MRN [id number]  PCP:  Carollee Herter, Alferd Apa, DO  Cardiologist:  Shirlee More, MD   Referring MD: Carollee Herter, Alferd Apa, *  ASSESSMENT:    1. Essential hypertension   2. Malaise and fatigue   3. SOB (shortness of breath)    PLAN:    In order of problems listed above:  1. HTN - On anti-hypertensive therapy for 30 years. Family history of HTN in her mother and father. Multiple medication intolerances with reactions such as cough, metallic taste to -sartan and CCB. Intermittent BP readings have home have been 120s/80s-90s. We discussed getting a fresh start today with her anti-hypertensive medications to attempt to resolve side effects of medications. She denies chest pain, palpitations, lower extremity edema.   Discontinue Coreg and Chlorthalidone.   Start Spironolactone 12.5mg  daily.  Renin-aldosterone lab today and BMET to assess renal function and electrolytes.  Renal artery ultrasound.   Check BP daily and maintain a log.  Recommend limiting dietary sodium.   Recommend 30 minutes of moderate intensity exercise 3-4 times per week per American Heart Association guidelines.   2. Malaise and fatigue - Endorses malaise and overall lack of energy. Etiology side effect of beta blocker versus thyroid. Will discontinue Coreg.   3. SOB - Endorses bronchospasm and cough. Left upper lobe with inspiratory wheeze on exam. Discontinue beta blocker. Etiology beta blocker versus pulmonary. Afebrile on exam today. If does not resolve off of beta blocker consider further evaluation to include ProBNP and echocardiogram.    Next appointment virtual visit in 4 weeks.    Medication Adjustments/Labs and Tests Ordered: Current medicines are reviewed at length with the patient today.  Concerns regarding medicines are outlined above.  Orders Placed This Encounter  Procedures  . Basic Metabolic Panel (BMET)  .  Aldosterone + renin activity w/ ratio  . EKG 12-Lead   No orders of the defined types were placed in this encounter.    Chief Complaint  Patient presents with  . New Patient (Initial Visit)    History of Present Illness:    Laura Hopkins is a 73 y.o. female who is being seen today for the evaluation of hypertension at the request of Ann Held, *.  She has been intolerant of beta-blocker alteration in taste nasal drainage, calcium channel blocker with edema, and ARB with a cough.  Current regimen includes carvedilol and chlorthalidone.  Heart review shows an EKG from 01/08/2008 sinus bradycardia 55 bpm independently reviewed otherwise normal.  Her concern is that with antihypertensive therapy she feels badly and perceived side effects. Perceived side effects include wheeze, metallic taste. She is noticed with the beta-blocker malaise cough wheezing and a metallic taste is unsure whether or not this is a diuretic or the beta-blocker.  She denies lower extremity edema, denies chest pain. Both her mother and father had HTN. Reports taking antihypertensives for close to thirty years.   Past Medical History:  Diagnosis Date  . Acute pharyngitis 02/26/2009   Qualifier: Diagnosis of  By: Jerold Coombe    . Arthritis    hands and neck   . Essential hypertension 02/07/2007   Qualifier: Diagnosis of  By: Jerold Coombe    . Headache(784.0)   . Insomnia 12/23/2017  . Lower extremity edema 07/31/2017  . Migraine 05/23/2017  . SINUSITIS - ACUTE-NOS 03/17/2009   Qualifier: Diagnosis of  By: Etter Sjogren DO,  Kendrick Fries    . Skin cancer    basal cell  . 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 Amy Martinique   . URI 02/07/2007   Qualifier: Diagnosis of  By: Jerold Coombe      Past Surgical History:  Procedure Laterality Date  . ABDOMINAL HYSTERECTOMY  1995  . COLONOSCOPY   2007   colon--negative  . EYE SURGERY Bilateral 03/03/2017   cataract sx with lens implant  . NASAL SEPTUM SURGERY  1983  . SHOULDER SURGERY  2000  . TUBAL LIGATION  1978  . WISDOM TOOTH EXTRACTION      Current Medications: Current Meds  Medication Sig  . acetaminophen (TYLENOL) 500 MG tablet Take 500 mg by mouth every 6 (six) hours as needed.  . ALPRAZolam (XANAX) 0.25 MG tablet Take 1 tablet by mouth 2 (two) times daily as needed.  Marland Kitchen Apoaequorin (PREVAGEN PO) Take 1 tablet by mouth daily.  . bimatoprost (LATISSE) 0.03 % ophthalmic solution Place 1 drop into both eyes 2 (two) times a day.  . calcium carbonate (OS-CAL) 600 MG TABS tablet Take 1 tablet by mouth 2 (two) times daily with a meal.  . carvedilol (COREG) 3.125 MG tablet Take 1.563 mg by mouth daily after supper.  . chlorthalidone (HYGROTON) 25 MG tablet Take 1 tablet (25 mg total) by mouth daily.  Marland Kitchen estradiol (VIVELLE-DOT) 0.05 MG/24HR Place 1 patch onto the skin once a week.    . Flaxseed, Linseed, (FLAX SEED OIL) 1000 MG CAPS Take 1 capsule by mouth daily.  . Glucosamine-Chondroitin (OSTEO BI-FLEX REGULAR STRENGTH PO) Take 1 tablet by mouth daily.   . naproxen sodium (ALEVE) 220 MG tablet Take 220 mg by mouth daily as needed.  . Omega-3 Fatty Acids (FISH OIL) 1000 MG CAPS Take 1 capsule by mouth daily.   . promethazine (PHENERGAN) 25 MG tablet Take 1 tablet (25 mg total) by mouth every 8 (eight) hours as needed for nausea or vomiting.  . SUMAtriptan (IMITREX) 100 MG tablet TAKE 1 TABLET (100 MG TOTAL) BY MOUTH AS NEEDED.MAX ON INSURANCE  . temazepam (RESTORIL) 30 MG capsule Take 1 capsule (30 mg total) by mouth at bedtime as needed for sleep.  Marland Kitchen VITAMIN D PO Take 1,000 mg by mouth daily.     Allergies:   Avapro [irbesartan]; Codeine; Metoprolol; and Norvasc [amlodipine besylate]   Social History   Socioeconomic History  . Marital status: Married    Spouse name: Not on file  . Number of children: 2  . Years of  education: Not on file  . Highest education level: Not on file  Occupational History  . 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 Financial resource strain: Not on file  . Food insecurity:    Worry: Not on file    Inability: Not on file  . Transportation needs:    Medical: Not on file    Non-medical: Not on file  Tobacco Use  . Smoking status: Former Smoker    Packs/day: 0.30    Types: Cigarettes    Last attempt to quit: 03/07/1999    Years since quitting: 20.0  . Smokeless tobacco: Never Used  Substance and Sexual Activity  . Alcohol use: No    Alcohol/week: 0.0 standard drinks  . Drug use: No  .  Sexual activity: Never    Partners: Male  Lifestyle  . Physical activity:    Days per week: Not on file    Minutes per session: Not on file  . Stress: Not on file  Relationships  . Social connections:    Talks on phone: Not on file    Gets together: Not on file    Attends religious service: Not on file    Active member of club or organization: Not on file    Attends meetings of clubs or organizations: Not on file    Relationship status: Not on file  Other Topics Concern  . Not on file  Social History Narrative  . Not on file     Family History: The patient's family history includes Arthritis in her father; Dementia in her mother; Diabetes in her father; Hypertension in her father and mother; Prostate cancer in her father; Skin cancer in her brother. There is no history of Colon cancer, Colon polyps, Esophageal cancer, Rectal cancer, or Stomach cancer.  ROS:   Review of Systems  Constitution: Positive for malaise/fatigue. Negative for chills, diaphoresis and fever.  Eyes: Negative for blurred vision and discharge.  Cardiovascular: Negative for chest pain, dyspnea on exertion, irregular heartbeat, leg swelling, near-syncope and palpitations.  Respiratory: Positive for cough, shortness of breath and wheezing. Negative for sleep  disturbances due to breathing.   Musculoskeletal: Negative for joint swelling and myalgias.  Gastrointestinal: Negative for constipation, diarrhea, nausea and vomiting.  Neurological: Negative for dizziness and light-headedness.   Please see the history of present illness.     All other systems reviewed and are negative.  EKGs/Labs/Other Studies Reviewed:    The following studies were reviewed today:  EKG:  EKG is 03/11/19 ordered today.  The ekg ordered today is personally reviewed and demonstrates sinus bradycardia rate 59.  Recent Labs: 05/31/2018: ALT 5; BUN 17; Creatinine, Ser 0.96; Potassium 4.7; Sodium 136  Recent Lipid Panel    Component Value Date/Time   CHOL 177 05/31/2018 1114   TRIG 107.0 05/31/2018 1114   HDL 59.40 05/31/2018 1114   CHOLHDL 3 05/31/2018 1114   VLDL 21.4 05/31/2018 1114   LDLCALC 96 05/31/2018 1114    Physical Exam:    VS:  BP (!) 146/102 (BP Location: Right Arm, Patient Position: Sitting, Cuff Size: Normal)   Pulse (!) 59   Ht 5\' 4"  (1.626 m)   Wt 148 lb 1.9 oz (67.2 kg)   SpO2 98%   BMI 25.42 kg/m     Wt Readings from Last 3 Encounters:  03/11/19 148 lb 1.9 oz (67.2 kg)  12/18/18 151 lb 9.6 oz (68.8 kg)  08/14/18 150 lb 12.8 oz (68.4 kg)     GEN:  Well nourished, well developed in no acute distress HEENT: Normal NECK: No JVD; No carotid bruits LYMPHATICS: No lymphadenopathy CARDIAC: RRR, no murmurs, rubs, gallops. RESPIRATORY:  Clear to auscultation without rales or rhonchi. Left upper lobe inspiratory wheeze.   ABDOMEN: Soft, non-tender, non-distended MUSCULOSKELETAL:  No edema; No deformity  VASCULAR: bilateral posterior tibial pulses 2+ bilateral radial pulses 2+ SKIN: Warm and dry NEUROLOGIC:  Alert and oriented x 3 PSYCHIATRIC:  Normal affect     Signed, Shirlee More, MD  03/11/2019 12:14 PM    [facility]

## 2019-03-11 ENCOUNTER — Ambulatory Visit: Payer: Medicare HMO | Admitting: Cardiology

## 2019-03-11 ENCOUNTER — Encounter: Payer: Self-pay | Admitting: Cardiology

## 2019-03-11 ENCOUNTER — Other Ambulatory Visit: Payer: Self-pay | Admitting: Family Medicine

## 2019-03-11 ENCOUNTER — Other Ambulatory Visit: Payer: Self-pay

## 2019-03-11 VITALS — BP 146/102 | HR 59 | Ht 64.0 in | Wt 148.1 lb

## 2019-03-11 DIAGNOSIS — I10 Essential (primary) hypertension: Secondary | ICD-10-CM | POA: Diagnosis not present

## 2019-03-11 DIAGNOSIS — R5383 Other fatigue: Secondary | ICD-10-CM | POA: Diagnosis not present

## 2019-03-11 DIAGNOSIS — R5381 Other malaise: Secondary | ICD-10-CM

## 2019-03-11 DIAGNOSIS — G4700 Insomnia, unspecified: Secondary | ICD-10-CM

## 2019-03-11 DIAGNOSIS — R0602 Shortness of breath: Secondary | ICD-10-CM | POA: Diagnosis not present

## 2019-03-11 NOTE — Patient Instructions (Addendum)
Medication Instructions:  Your physician has recommended you make the following change in your medication:  STOP: Coreg STOP: Chlorthalidone  START: Spironolactone 25mg  (1/2 tab total 12.5 mg) daily  If you need a refill on your cardiac medications before your next appointment, please call your pharmacy.   Lab work: Your physician recommends that you return for lab work in: [place] level,BMP  If you have labs (blood work) drawn today and your tests are completely normal, you will receive your results only by: Marland Kitchen MyChart Message (if you have MyChart) OR . A paper copy in the mail If you have any lab test that is abnormal or we need to change your treatment, we will call you to review the results.  Testing/Procedures: You will be scheduled  For a renal artery ultra sound  Follow-Up: At [facility], you and your health needs are our priority.  As part of our continuing mission to provide you with exceptional heart care, we have created designated Provider Care Teams.  These Care Teams include your primary Cardiologist (physician) and Advanced Practice Providers (APPs -  Physician Assistants and Nurse Practitioners) who all work together to provide you with the care you need, when you need it. You will need a follow up appointment in 4 weeks.  Please call our office 2 months in advance to schedule this appointment.   Any Other Special Instructions Will Be Listed Below (If Applicable).   DECREASE SALT INTAKE  MONITOR BLOOD PRESSURE DAILY AND RECORD  INCREASE PHYSICAL ACTIVITY    Blood Pressure Record Sheet To take your blood pressure, you will need a blood pressure machine. You can buy a blood pressure machine (blood pressure monitor) at your clinic, drug store, or online. When choosing one, consider:  An automatic monitor that has an arm cuff.  A cuff that wraps snugly around your upper arm. You should be able to fit only one finger between your arm and the  cuff.  A device that stores blood pressure reading results.  Do not choose a monitor that measures your blood pressure from your wrist or finger. Follow your health care provider's instructions for how to take your blood pressure. To use this form:  Get one reading in the morning (a.m.) before you take any medicines.  Get one reading in the evening (p.m.) before supper.  Take at least 2 readings with each blood pressure check. This makes sure the results are correct. Wait 1-2 minutes between measurements.  Write down the results in the spaces on this form.  Repeat this once a week, or as told by your health care provider.  Make a follow-up appointment with your health care provider to discuss the results. Blood pressure log Date: _______________________  a.m. _____________________(1st reading) _____________________(2nd reading)  p.m. _____________________(1st reading) _____________________(2nd reading) Date: _______________________  a.m. _____________________(1st reading) _____________________(2nd reading)  p.m. _____________________(1st reading) _____________________(2nd reading) Date: _______________________  a.m. _____________________(1st reading) _____________________(2nd reading)  p.m. _____________________(1st reading) _____________________(2nd reading) Date: _______________________  a.m. _____________________(1st reading) _____________________(2nd reading)  p.m. _____________________(1st reading) _____________________(2nd reading) Date: _______________________  a.m. _____________________(1st reading) _____________________(2nd reading)  p.m. _____________________(1st reading) _____________________(2nd reading) This information is not intended to replace advice given to you by your health care provider. Make sure you discuss any questions you have with your health care provider. Document Released: 06/18/2003 Document Revised: 09/19/2017 Document Reviewed:  09/19/2017 Elsevier Interactive Patient Education  2019 [employer].

## 2019-03-12 NOTE — Telephone Encounter (Signed)
Requesting: Temazepam Contract: 02/12/2018 UDS: 02/12/2018 Last OV: 02/15/2019 Next OV: 02/15/2019 Last Refill: 08/14/2018, #30--5 RF Database:   Please advise

## 2019-03-14 ENCOUNTER — Other Ambulatory Visit: Payer: Self-pay | Admitting: *Deleted

## 2019-03-14 MED ORDER — SPIRONOLACTONE 25 MG PO TABS: 12.5000 mg | ORAL_TABLET | Freq: Every day | ORAL | 3 refills | Status: DC

## 2019-03-14 NOTE — Progress Notes (Signed)
Sent Spironolactone to [facility] in [place]. Dr. Bettina Gavia ordered for pt but was not sent in to pharmacy. Pt called and it was sent in.

## 2019-03-22 ENCOUNTER — Other Ambulatory Visit: Payer: Self-pay

## 2019-03-22 ENCOUNTER — Ambulatory Visit (HOSPITAL_BASED_OUTPATIENT_CLINIC_OR_DEPARTMENT_OTHER)
Admission: RE | Admit: 2019-03-22 | Discharge: 2019-03-22 | Disposition: A | Discharge status: Home / Self Care | Payer: Medicare HMO | Source: Ambulatory Visit | Attending: Cardiology | Admitting: Cardiology

## 2019-03-22 DIAGNOSIS — I10 Essential (primary) hypertension: Secondary | ICD-10-CM | POA: Insufficient documentation

## 2019-03-22 NOTE — Progress Notes (Signed)
Renal Ultrasound doppler    03/22/19  Laura Hopkins

## 2019-03-25 ENCOUNTER — Telehealth: Payer: Self-pay | Admitting: *Deleted

## 2019-03-25 NOTE — Telephone Encounter (Signed)
Telephone call to patient . Left message to return call regarding Renal ultrasound results.

## 2019-03-25 NOTE — Telephone Encounter (Signed)
-----   Message from Richardo Priest, MD sent at 03/25/2019  7:48 AM EDT ----- Normal or stable result

## 2019-04-09 NOTE — Progress Notes (Signed)
Virtual Visit via Video Note   This visit type was conducted due to national recommendations for restrictions regarding the COVID-19 Pandemic (e.g. social distancing) in an effort to limit this patient's exposure and mitigate transmission in our community.  Due to her co-morbid illnesses, this patient is at least at moderate risk for complications without adequate follow up.  This format is felt to be most appropriate for this patient at this time.  All issues noted in this document were discussed and addressed.  A limited physical exam was performed with this format.  Please refer to the patient's chart for her consent to telehealth for [facility].   Date:  04/10/2019   ID:  Laura Hopkins, DOB August 24, 1946, MRN [id number]  Patient Location: Home Provider Location: Office  PCP:  Carollee Herter, Alferd Apa, DO  Cardiologist:  Shirlee More, MD  Electrophysiologist:  None   Evaluation Performed:  Follow-Up Visit  Chief Complaint: Assess response to change in treatment for hypertension  History of Present Illness:    Laura Hopkins is a 73 y.o. female with hypertension in excess of 30 years recently poorly controlled seen in the office in consultation 03/11/2019 at which time she was started on spironolactone, carvedilol and chlorthalidone were discontinued.  Evaluation showed normal renal function potassium aldosterone to renin ratio and normal renal vascular duplex without evidence of renal artery stenosis.  Her hypertensive therapy has been complicated by multiple drug intolerances.  The patient does not have symptoms concerning for COVID-19 infection (fever, chills, cough, or new shortness of breath).   She is doing better blood pressure typically is less than 140 and is in the 80s.  She tolerates spironolactone but she still has a residual cough and I told her I doubt it is related to the distal diuretic.  She has had no edema chest pain shortness of breath palpitation or syncope.  Presently is  at the beach and when she returns in 1 to 2 weeks for recheck renal function.  She will continue her current distal diuretic monitor home blood pressure and I will see back in my office in 3 months Past Medical History:  Diagnosis Date  . Acute pharyngitis 02/26/2009   Qualifier: Diagnosis of  By: Jerold Coombe    . Arthritis    hands and neck   . Essential hypertension 02/07/2007   Qualifier: Diagnosis of  By: Jerold Coombe    . Headache(784.0)   . Insomnia 12/23/2017  . Lower extremity edema 07/31/2017  . Migraine 05/23/2017  . SINUSITIS - ACUTE-NOS 03/17/2009   Qualifier: Diagnosis of  By: Jerold Coombe    . Skin cancer    basal cell  . 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 Amy Martinique   . URI 02/07/2007   Qualifier: Diagnosis of  By: Jerold Coombe     Past Surgical History:  Procedure Laterality Date  . ABDOMINAL HYSTERECTOMY  1995  . COLONOSCOPY  2007   colon--negative  . EYE SURGERY Bilateral 03/03/2017   cataract sx with lens implant  . NASAL SEPTUM SURGERY  1983  . SHOULDER SURGERY  2000  . TUBAL LIGATION  1978  . WISDOM TOOTH EXTRACTION       Current Meds  Medication Sig  . acetaminophen (TYLENOL) 500 MG tablet Take 500 mg by mouth every 6 (six) hours as  needed.  . ALPRAZolam (XANAX) 0.25 MG tablet Take 1 tablet by mouth 2 (two) times daily as needed.  Marland Kitchen Apoaequorin (PREVAGEN PO) Take 1 tablet by mouth daily.  . bimatoprost (LATISSE) 0.03 % ophthalmic solution Place 1 drop into both eyes 2 (two) times a day.  . calcium carbonate (OS-CAL) 600 MG TABS tablet Take 1 tablet by mouth 2 (two) times daily with a meal.  . estradiol (VIVELLE-DOT) 0.05 MG/24HR Place 1 patch onto the skin once a week.    . Flaxseed, Linseed, (FLAX SEED OIL) 1000 MG CAPS Take 1 capsule by mouth daily.  . Glucosamine-Chondroitin (OSTEO BI-FLEX REGULAR STRENGTH PO)  Take 1 tablet by mouth daily.   . naproxen sodium (ALEVE) 220 MG tablet Take 220 mg by mouth daily as needed.  . Omega-3 Fatty Acids (FISH OIL) 1000 MG CAPS Take 1 capsule by mouth daily.   . promethazine (PHENERGAN) 25 MG tablet Take 1 tablet (25 mg total) by mouth every 8 (eight) hours as needed for nausea or vomiting.  Marland Kitchen spironolactone (ALDACTONE) 25 MG tablet Take 0.5 tablets (12.5 mg total) by mouth daily.  . SUMAtriptan (IMITREX) 100 MG tablet TAKE 1 TABLET (100 MG TOTAL) BY MOUTH AS NEEDED.MAX ON INSURANCE  . temazepam (RESTORIL) 30 MG capsule TAKE 1 CAPSULE (30 MG TOTAL) BY MOUTH AT BEDTIME AS NEEDED FOR SLEEP.  Marland Kitchen VITAMIN D PO Take 1,000 mg by mouth daily.     Allergies:   Avapro [irbesartan], Codeine, Metoprolol, and Norvasc [amlodipine besylate]   Social History   Tobacco Use  . Smoking status: Former Smoker    Packs/day: 0.30    Types: Cigarettes    Quit date: 03/07/1999    Years since quitting: 20.1  . Smokeless tobacco: Never Used  Substance Use Topics  . Alcohol use: No    Alcohol/week: 0.0 standard drinks  . Drug use: No     Family Hx: The patient's family history includes Arthritis in her father; Dementia in her mother; Diabetes in her father; Hypertension in her father and mother; Prostate cancer in her father; Skin cancer in her brother. There is no history of Colon cancer, Colon polyps, Esophageal cancer, Rectal cancer, or Stomach cancer.  ROS:   Please see the history of present illness.    She continues to have a nonproductive cough All other systems reviewed and are negative.   Prior CV studies:   The following studies were reviewed today:    Labs/Other Tests and Data Reviewed:     Recent Labs: 05/31/2018: ALT 5 03/11/2019: BUN 13; Creatinine, Ser 0.94; Potassium 4.3; Sodium 139   Recent Lipid Panel Lab Results  Component Value Date/Time   CHOL 177 05/31/2018 11:14 AM   TRIG 107.0 05/31/2018 11:14 AM   HDL 59.40 05/31/2018 11:14 AM   CHOLHDL 3  05/31/2018 11:14 AM   LDLCALC 96 05/31/2018 11:14 AM    Wt Readings from Last 3 Encounters:  04/10/19 150 lb 6.4 oz (68.2 kg)  03/11/19 148 lb 1.9 oz (67.2 kg)  12/18/18 151 lb 9.6 oz (68.8 kg)     Objective:    Vital Signs:  BP 138/88 (BP Location: Left Arm)   Pulse 77   Ht 5\' 4"  (1.626 m)   Wt 150 lb 6.4 oz (68.2 kg)   BMI 25.82 kg/m    VITAL SIGNS:  reviewed GEN:  no acute distress EYES:  sclerae anicteric, EOMI - Extraocular Movements Intact RESPIRATORY:  normal respiratory effort, symmetric expansion CARDIOVASCULAR:  no peripheral  edema SKIN:  no rash, lesions or ulcers. MUSCULOSKELETAL:  no obvious deformities. NEURO:  alert and oriented x 3, no obvious focal deficit PSYCH:  normal affect  ASSESSMENT & PLAN:    1. Essential hypertension stable improved has done well achieving target with MRA would continue spironolactone check renal function to exclude hyperkalemia or decrease in GFR.  She seems to be convinced that hypertensive agents cause cough I think any residual cough here is unrelated and if it is persistent she will need further evaluation with her primary care physician.  I asked her to continue to sodium restrict I will plan to see her in the office 3 months  COVID-19 Education: The signs and symptoms of COVID-19 were discussed with the patient and how to seek care for testing (follow up with PCP or arrange E-visit).  The importance of social distancing was discussed today.  Time:   Today, I have spent 15 minutes with the patient with telehealth technology discussing the above problems.     Medication Adjustments/Labs and Tests Ordered: Current medicines are reviewed at length with the patient today.  Concerns regarding medicines are outlined above.   Tests Ordered: No orders of the defined types were placed in this encounter.   Medication Changes: No orders of the defined types were placed in this encounter.   Follow Up:  In Person in 3 month(s)   Signed, Shirlee More, MD  04/10/2019 1:49 PM    [facility]

## 2019-04-10 ENCOUNTER — Telehealth (INDEPENDENT_AMBULATORY_CARE_PROVIDER_SITE_OTHER): Payer: Medicare HMO | Admitting: Cardiology

## 2019-04-10 ENCOUNTER — Telehealth: Payer: Self-pay

## 2019-04-10 ENCOUNTER — Other Ambulatory Visit: Payer: Self-pay

## 2019-04-10 ENCOUNTER — Encounter: Payer: Self-pay | Admitting: Cardiology

## 2019-04-10 VITALS — BP 138/88 | HR 77 | Ht 64.0 in | Wt 150.4 lb

## 2019-04-10 DIAGNOSIS — R0602 Shortness of breath: Secondary | ICD-10-CM | POA: Diagnosis not present

## 2019-04-10 DIAGNOSIS — I10 Essential (primary) hypertension: Secondary | ICD-10-CM | POA: Diagnosis not present

## 2019-04-10 DIAGNOSIS — R600 Localized edema: Secondary | ICD-10-CM | POA: Diagnosis not present

## 2019-04-10 NOTE — Telephone Encounter (Signed)
Virtual Visit Pre-Appointment Phone Call  "(Name), I am calling you today to discuss your upcoming appointment. We are currently trying to limit exposure to the virus that causes COVID-19 by seeing patients at home rather than in the office."  1. "What is the BEST phone number to call the day of the visit?" - include this in appointment notes  2. "Do you have or have access to (through a family member/friend) a smartphone with video capability that we can use for your visit?" a. If yes - list this number in appt notes as "cell" (if different from BEST phone #) and list the appointment type as a VIDEO visit in appointment notes b. If no - list the appointment type as a PHONE visit in appointment notes  3. Confirm consent - "In the setting of the current Covid19 crisis, you are scheduled for a (phone or video) visit with your provider on (date) at (time).  Just as we do with many in-office visits, in order for you to participate in this visit, we must obtain consent.  If you'd like, I can send this to your mychart (if signed up) or email for you to review.  Otherwise, I can obtain your verbal consent now.  All virtual visits are billed to your insurance company just like a normal visit would be.  By agreeing to a virtual visit, we'd like you to understand that the technology does not allow for your provider to perform an examination, and thus may limit your provider's ability to fully assess your condition. If your provider identifies any concerns that need to be evaluated in person, we will make arrangements to do so.  Finally, though the technology is pretty good, we cannot assure that it will always work on either your or our end, and in the setting of a video visit, we may have to convert it to a phone-only visit.  In either situation, we cannot ensure that we have a secure connection.  Are you willing to proceed?"DoxyME  4. Advise patient to be prepared - "Two hours prior to your appointment, go  ahead and check your blood pressure, pulse, oxygen saturation, and your weight (if you have the equipment to check those) and write them all down. When your visit starts, your provider will ask you for this information. If you have an Apple Watch or Kardia device, please plan to have heart rate information ready on the day of your appointment. Please have a pen and paper handy nearby the day of the visit as well."  5. Give patient instructions for MyChart download to smartphone OR Doximity/Doxy.me as below if video visit (depending on what platform provider is using)  6. Inform patient they will receive a phone call 15 minutes prior to their appointment time (may be from unknown caller ID) so they should be prepared to answer    TELEPHONE CALL NOTE  Tata Timmins has been deemed a candidate for a follow-up tele-health visit to limit community exposure during the Covid-19 pandemic. I spoke with the patient via phone to ensure availability of phone/video source, confirm preferred email & phone number, and discuss instructions and expectations.  I reminded Dilcia Rybarczyk to be prepared with any vital sign and/or heart rhythm information that could potentially be obtained via home monitoring, at the time of her visit. I reminded Kambria Grima to expect a phone call prior to her visit.  Stevan Born, CMA 04/10/2019 1:30 PM   INSTRUCTIONS FOR DOWNLOADING THE [employer]  APP TO SMARTPHONE  - The patient must first make sure to have activated MyChart and know their login information - If Apple, go to [employer] and type in MyChart in the search bar and download the app. If Android, ask patient to go to [employer] and type in [place] in the search bar and download the app. The app is free but as with any other app downloads, their phone may require them to verify saved payment information or Apple/Android password.  - The patient will need to then log into the app with their MyChart username and password,  and select [facility] as their healthcare provider to link the account. When it is time for your visit, go to the MyChart app, find appointments, and click Begin Video Visit. Be sure to Select Allow for your device to access the Microphone and Camera for your visit. You will then be connected, and your provider will be with you shortly.  **If they have any issues connecting, or need assistance please contact MyChart service desk [phone])**  **If using a computer, in order to ensure the best quality for their visit they will need to use either of the following Internet Browsers: [employer], or Google Chrome**  IF USING DOXIMITY or DOXY.ME - The patient will receive a link just prior to their visit by text.     FULL LENGTH CONSENT FOR TELE-HEALTH VISIT   I hereby voluntarily request, consent and authorize [facility] and its employed or contracted physicians, physician assistants, nurse practitioners or other licensed health care professionals (the Practitioner), to provide me with telemedicine health care services (the "Services") as deemed necessary by the treating Practitioner. I acknowledge and consent to receive the Services by the Practitioner via telemedicine. I understand that the telemedicine visit will involve communicating with the Practitioner through live audiovisual communication technology and the disclosure of certain medical information by electronic transmission. I acknowledge that I have been given the opportunity to request an in-person assessment or other available alternative prior to the telemedicine visit and am voluntarily participating in the telemedicine visit.  I understand that I have the right to withhold or withdraw my consent to the use of telemedicine in the course of my care at any time, without affecting my right to future care or treatment, and that the Practitioner or I may terminate the telemedicine visit at any time. I understand that I  have the right to inspect all information obtained and/or recorded in the course of the telemedicine visit and may receive copies of available information for a reasonable fee.  I understand that some of the potential risks of receiving the Services via telemedicine include:  Marland Kitchen Delay or interruption in medical evaluation due to technological equipment failure or disruption; . Information transmitted may not be sufficient (e.g. poor resolution of images) to allow for appropriate medical decision making by the Practitioner; and/or  . In rare instances, security protocols could fail, causing a breach of personal health information.  Furthermore, I acknowledge that it is my responsibility to provide information about my medical history, conditions and care that is complete and accurate to the best of my ability. I acknowledge that Practitioner's advice, recommendations, and/or decision may be based on factors not within their control, such as incomplete or inaccurate data provided by me or distortions of diagnostic images or specimens that may result from electronic transmissions. I understand that the practice of medicine is not an exact science and that Practitioner makes no  warranties or guarantees regarding treatment outcomes. I acknowledge that I will receive a copy of this consent concurrently upon execution via email to the email address I last provided but may also request a printed copy by calling the office of [facility].    I understand that my insurance will be billed for this visit.   I have read or had this consent read to me. . I understand the contents of this consent, which adequately explains the benefits and risks of the Services being provided via telemedicine.  . I have been provided ample opportunity to ask questions regarding this consent and the Services and have had my questions answered to my satisfaction. . I give my informed consent for the services to be provided through the  use of telemedicine in my medical care  By participating in this telemedicine visit I agree to the above.

## 2019-04-10 NOTE — Patient Instructions (Signed)
Medication Instructions:  Your physician recommends that you continue on your current medications as directed. Please refer to the Current Medication list given to you today.  If you need a refill on your cardiac medications before your next appointment, please call your pharmacy.   Lab work: Your physician recommends that you return for lab work next week: BMP. Please go to the [facility] office for lab work, no appointment needed. No need to fast beforehand.   If you have labs (blood work) drawn today and your tests are completely normal, you will receive your results only by: Marland Kitchen MyChart Message (if you have MyChart) OR . A paper copy in the mail If you have any lab test that is abnormal or we need to change your treatment, we will call you to review the results.  Testing/Procedures: None  Follow-Up: At [facility], you and your health needs are our priority.  As part of our continuing mission to provide you with exceptional heart care, we have created designated Provider Care Teams.  These Care Teams include your primary Cardiologist (physician) and Advanced Practice Providers (APPs -  Physician Assistants and Nurse Practitioners) who all work together to provide you with the care you need, when you need it. You will need a follow up appointment in 3 months.

## 2019-07-02 DIAGNOSIS — R69 Illness, unspecified: Secondary | ICD-10-CM | POA: Diagnosis not present

## 2019-07-04 ENCOUNTER — Encounter: Payer: Self-pay | Admitting: Cardiology

## 2019-07-04 ENCOUNTER — Ambulatory Visit: Payer: Medicare HMO | Admitting: Cardiology

## 2019-07-04 ENCOUNTER — Other Ambulatory Visit: Payer: Self-pay

## 2019-07-04 VITALS — BP 144/92 | HR 66 | Temp 97.7°F | Ht 64.0 in | Wt 153.8 lb

## 2019-07-04 DIAGNOSIS — I10 Essential (primary) hypertension: Secondary | ICD-10-CM

## 2019-07-04 DIAGNOSIS — R5383 Other fatigue: Secondary | ICD-10-CM | POA: Diagnosis not present

## 2019-07-04 DIAGNOSIS — R5381 Other malaise: Secondary | ICD-10-CM | POA: Diagnosis not present

## 2019-07-04 DIAGNOSIS — R69 Illness, unspecified: Secondary | ICD-10-CM | POA: Diagnosis not present

## 2019-07-04 DIAGNOSIS — R600 Localized edema: Secondary | ICD-10-CM

## 2019-07-04 NOTE — Patient Instructions (Addendum)
Medication Instructions:  No medication changes today.   If you need a refill on your cardiac medications before your next appointment, please call your pharmacy.   Lab work: No lab work today.   If you have labs (blood work) drawn today and your tests are completely normal, you will receive your results only by: Marland Kitchen MyChart Message (if you have MyChart) OR . A paper copy in the mail If you have any lab test that is abnormal or we need to change your treatment, we will call you to review the results.  Testing/Procedures: No testing ordered today.  Follow-Up: At [facility], you and your health needs are our priority.  As part of our continuing mission to provide you with exceptional heart care, we have created designated Provider Care Teams.  These Care Teams include your primary Cardiologist (physician) and Advanced Practice Providers (APPs -  Physician Assistants and Nurse Practitioners) who all work together to provide you with the care you need, when you need it. You may follow up on an as-needed basis. You may see Shirlee More, MD or another member of our [facility] Provider Team in [place]: Jenne Campus, MD . Jyl Heinz, MD . Berniece Salines, MD . Laurann Montana, NP  Any Other Special Instructions Will Be Listed Below (If Applicable).  Tips to Measure your Blood Pressure Correctly  To determine whether you have hypertension, a medical professional will take a blood pressure reading. How you prepare for the test, the position of your arm, and other factors can change a blood pressure reading by 10% or more. That could be enough to hide high blood pressure, start you on a drug you don't really need, or lead your doctor to incorrectly adjust your medications.  National and international guidelines offer specific instructions for measuring blood pressure. If a doctor, nurse, or medical assistant isn't doing it right, don't hesitate to ask him or her to get with the  guidelines.  Here's what you can do to ensure a correct reading: . Don't drink a caffeinated beverage or smoke during the 30 minutes before the test. . Sit quietly for five minutes before the test begins. . During the measurement, sit in a chair with your feet on the floor and your arm supported so your elbow is at about heart level. . The inflatable part of the cuff should completely cover at least 80% of your upper arm, and the cuff should be placed on bare skin, not over a shirt. . Don't talk during the measurement. . Have your blood pressure measured twice, with a brief break in between. If the readings are different by 5 points or more, have it done a third time.  There are times to break these rules. If you sometimes feel lightheaded when getting out of bed in the morning or when you stand after sitting, you should have your blood pressure checked while seated and then while standing to see if it falls from one position to the next.  Because blood pressure varies throughout the day, your doctor will rarely diagnose hypertension on the basis of a single reading. Instead, he or she will want to confirm the measurements on at least two occasions, usually within a few weeks of one another. The exception to this rule is if you have a blood pressure reading of 180/110 mm Hg or higher. A result this high usually calls for prompt treatment.  It's a good idea to have your blood pressure measured in both arms  at least once, since the reading in one arm (usually the right) may be higher than that in the left. A 2014 study in The American Journal of Medicine of nearly 3,400 people found average arm- to-arm differences in systolic blood pressure of about 5 points. The higher number should be used to make treatment decisions.  In 2017, new guidelines from the [place], the [employer] of Cardiology, and nine other health organizations lowered the diagnosis of high blood pressure to  130/80 mm Hg or higher for all adults. The guidelines also redefined the various blood pressure categories to now include normal, elevated, Stage 1 hypertension, Stage 2 hypertension, and hypertensive crisis (see "Blood pressure categories").  Blood pressure categories  Blood pressure category SYSTOLIC (upper number)  DIASTOLIC (lower number)  Normal Less than 120 mm Hg and Less than 80 mm Hg  Elevated 120-129 mm Hg and Less than 80 mm Hg  High blood pressure: Stage 1 hypertension 130-139 mm Hg or 80-89 mm Hg  High blood pressure: Stage 2 hypertension 140 mm Hg or higher or 90 mm Hg or higher  Hypertensive crisis (consult your doctor immediately) Higher than 180 mm Hg and/or Higher than 120 mm Hg  Source: American Heart Association and American Stroke Association. For more on getting your blood pressure under control, buy Controlling Your Blood Pressure, a Special Health Report from [facility].

## 2019-07-04 NOTE — Progress Notes (Signed)
Cardiology Office Note:    Date:  07/04/2019   ID:  Laura Hopkins, DOB 09/30/1946, MRN [id number]  PCP:  Carollee Herter, Alferd Apa, DO  Cardiologist:  Shirlee More, MD    Referring MD: Carollee Herter, Alferd Apa, *    ASSESSMENT:    1. Essential hypertension   2. Malaise and fatigue   3. Lower extremity edema    PLAN:    In order of problems listed above:  1. Stable blood pressure ambulatory readings are invariably in range continue single agent MRA if needed could increase to 25 mg daily and she will follow-up with her PCP and will need every 6 months to have renal function and potassium checked. 2. Resolved off beta-blocker 3. Resolved with MRA   Next appointment: As needed   Medication Adjustments/Labs and Tests Ordered: Current medicines are reviewed at length with the patient today.  Concerns regarding medicines are outlined above.  No orders of the defined types were placed in this encounter.  No orders of the defined types were placed in this encounter.   Chief Complaint  Patient presents with  . Follow-up  . Hypertension    History of Present Illness:    Laura Hopkins is a 73 y.o. female with a hx of  hypertension with multiple drug intolerances tolerant of beta-blocker calcium channel blocker and ARB last seen 2020 virtual visit. Compliance with diet, lifestyle and medications: Yes  She tolerates her MRI no side effects Home blood pressures run 120 135/60-70.  Malaise due to beta-blocker is resolved and she has no peripheral edema.  She is pleased with the quality of her life and response to treatment.  Predominant problem right now is worsened migraine and she will be discussing with her primary care physician.  Visor start taking low-dose aspirin 81 mg daily which in a subset of people can be very beneficial for migraine prophylaxis Past Medical History:  Diagnosis Date  . Acute pharyngitis 02/26/2009   Qualifier: Diagnosis of  By: Jerold Coombe    . Arthritis     hands and neck   . Essential hypertension 02/07/2007   Qualifier: Diagnosis of  By: Jerold Coombe    . Headache(784.0)   . Insomnia 12/23/2017  . Lower extremity edema 07/31/2017  . Migraine 05/23/2017  . SINUSITIS - ACUTE-NOS 03/17/2009   Qualifier: Diagnosis of  By: Jerold Coombe    . Skin cancer    basal cell  . 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 Amy Martinique   . URI 02/07/2007   Qualifier: Diagnosis of  By: Jerold Coombe      Past Surgical History:  Procedure Laterality Date  . ABDOMINAL HYSTERECTOMY  1995  . COLONOSCOPY  2007   colon--negative  . EYE SURGERY Bilateral 03/03/2017   cataract sx with lens implant  . NASAL SEPTUM SURGERY  1983  . SHOULDER SURGERY  2000  . TUBAL LIGATION  1978  . WISDOM TOOTH EXTRACTION      Current Medications: Current Meds  Medication Sig  . acetaminophen (TYLENOL) 500 MG tablet Take 500 mg by mouth every 6 (six) hours as needed.  . ALPRAZolam (XANAX) 0.25 MG tablet Take 1 tablet by mouth 2 (two) times daily as needed.  Marland Kitchen Apoaequorin (PREVAGEN PO) Take 1 tablet by mouth daily.  . bimatoprost (LATISSE) 0.03 % ophthalmic  solution Place 1 drop into both eyes 2 (two) times a day.  . calcium carbonate (OS-CAL) 600 MG TABS tablet Take 1 tablet by mouth 2 (two) times daily with a meal.  . estradiol (VIVELLE-DOT) 0.05 MG/24HR Place 1 patch onto the skin once a week.    . Flaxseed, Linseed, (FLAX SEED OIL) 1000 MG CAPS Take 1 capsule by mouth daily.  . Glucosamine-Chondroitin (OSTEO BI-FLEX REGULAR STRENGTH PO) Take 1 tablet by mouth daily.   . naproxen sodium (ALEVE) 220 MG tablet Take 220 mg by mouth daily as needed.  . Omega-3 Fatty Acids (FISH OIL) 1000 MG CAPS Take 1 capsule by mouth daily.   . promethazine (PHENERGAN) 25 MG tablet Take 1 tablet (25 mg total) by mouth every 8 (eight) hours as needed for nausea  or vomiting.  Marland Kitchen spironolactone (ALDACTONE) 25 MG tablet Take 0.5 tablets (12.5 mg total) by mouth daily.  . SUMAtriptan (IMITREX) 100 MG tablet TAKE 1 TABLET (100 MG TOTAL) BY MOUTH AS NEEDED.MAX ON INSURANCE  . temazepam (RESTORIL) 30 MG capsule TAKE 1 CAPSULE (30 MG TOTAL) BY MOUTH AT BEDTIME AS NEEDED FOR SLEEP.  Marland Kitchen VITAMIN D PO Take 1,000 mg by mouth daily.     Allergies:   Avapro [irbesartan], Codeine, Metoprolol, and Norvasc [amlodipine besylate]   Social History   Socioeconomic History  . Marital status: Married    Spouse name: Not on file  . Number of children: 2  . Years of education: Not on file  . Highest education level: Not on file  Occupational History  . 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 Financial resource strain: Not on file  . Food insecurity    Worry: Not on file    Inability: Not on file  . Transportation needs    Medical: Not on file    Non-medical: Not on file  Tobacco Use  . Smoking status: Former Smoker    Packs/day: 0.30    Types: Cigarettes    Quit date: 03/07/1999    Years since quitting: 20.3  . Smokeless tobacco: Never Used  Substance and Sexual Activity  . Alcohol use: No    Alcohol/week: 0.0 standard drinks  . Drug use: No  . Sexual activity: Never    Partners: Male  Lifestyle  . Physical activity    Days per week: Not on file    Minutes per session: Not on file  . Stress: Not on file  Relationships  . Social Herbalist on phone: Not on file    Gets together: Not on file    Attends religious service: Not on file    Active member of club or organization: Not on file    Attends meetings of clubs or organizations: Not on file    Relationship status: Not on file  Other Topics Concern  . Not on file  Social History Narrative  . Not on file     Family History: The patient's family history includes Arthritis in her father; Dementia in her mother; Diabetes in her father;  Hypertension in her father and mother; Prostate cancer in her father; Skin cancer in her brother. There is no history of Colon cancer, Colon polyps, Esophageal cancer, Rectal cancer, or Stomach cancer. ROS:   Please see the history of present illness.    All other systems reviewed and are negative.  EKGs/Labs/Other Studies Reviewed:    The following studies were reviewed  today:   Recent Labs: 03/11/2019: BUN 13; Creatinine, Ser 0.94; Potassium 4.3; Sodium 139  Recent Lipid Panel    Component Value Date/Time   CHOL 177 05/31/2018 1114   TRIG 107.0 05/31/2018 1114   HDL 59.40 05/31/2018 1114   CHOLHDL 3 05/31/2018 1114   VLDL 21.4 05/31/2018 1114   LDLCALC 96 05/31/2018 1114    Physical Exam:    VS:  BP (!) 144/92 (BP Location: Left Arm, Patient Position: Sitting, Cuff Size: Normal)   Pulse 66   Temp 97.7 F (36.5 C)   Ht 5\' 4"  (1.626 m)   Wt 153 lb 12.8 oz (69.8 kg)   SpO2 95%   BMI 26.40 kg/m     Wt Readings from Last 3 Encounters:  07/04/19 153 lb 12.8 oz (69.8 kg)  04/10/19 150 lb 6.4 oz (68.2 kg)  03/11/19 148 lb 1.9 oz (67.2 kg)     GEN:  Well nourished, well developed in no acute distress HEENT: Normal NECK: No JVD; No carotid bruits LYMPHATICS: No lymphadenopathy CARDIAC: RRR, no murmurs, rubs, gallops RESPIRATORY:  Clear to auscultation without rales, wheezing or rhonchi  ABDOMEN: Soft, non-tender, non-distended MUSCULOSKELETAL:  No edema; No deformity  SKIN: Warm and dry NEUROLOGIC:  Alert and oriented x 3 PSYCHIATRIC:  Normal affect    Signed, Shirlee More, MD  07/04/2019 10:57 AM    [place]

## 2019-08-06 DIAGNOSIS — R69 Illness, unspecified: Secondary | ICD-10-CM | POA: Diagnosis not present

## 2019-09-04 DIAGNOSIS — D225 Melanocytic nevi of trunk: Secondary | ICD-10-CM | POA: Diagnosis not present

## 2019-09-04 DIAGNOSIS — Z85828 Personal history of other malignant neoplasm of skin: Secondary | ICD-10-CM | POA: Diagnosis not present

## 2019-09-04 DIAGNOSIS — L821 Other seborrheic keratosis: Secondary | ICD-10-CM | POA: Diagnosis not present

## 2019-09-04 DIAGNOSIS — D2272 Melanocytic nevi of left lower limb, including hip: Secondary | ICD-10-CM | POA: Diagnosis not present

## 2019-09-04 DIAGNOSIS — L570 Actinic keratosis: Secondary | ICD-10-CM | POA: Diagnosis not present

## 2019-09-04 DIAGNOSIS — D1801 Hemangioma of skin and subcutaneous tissue: Secondary | ICD-10-CM | POA: Diagnosis not present

## 2019-09-05 ENCOUNTER — Other Ambulatory Visit: Payer: Self-pay

## 2019-09-05 ENCOUNTER — Ambulatory Visit (INDEPENDENT_AMBULATORY_CARE_PROVIDER_SITE_OTHER): Payer: Medicare HMO | Admitting: Family Medicine

## 2019-09-05 ENCOUNTER — Encounter: Payer: Self-pay | Admitting: Family Medicine

## 2019-09-05 VITALS — BP 130/80 | HR 64 | Temp 97.9°F | Resp 18 | Ht 64.0 in | Wt 152.6 lb

## 2019-09-05 DIAGNOSIS — G43809 Other migraine, not intractable, without status migrainosus: Secondary | ICD-10-CM

## 2019-09-05 DIAGNOSIS — G43819 Other migraine, intractable, without status migrainosus: Secondary | ICD-10-CM

## 2019-09-05 MED ORDER — SUMATRIPTAN SUCCINATE 100 MG PO TABS: ORAL_TABLET | ORAL | 3 refills | Status: DC

## 2019-09-05 NOTE — Patient Instructions (Signed)

## 2019-09-05 NOTE — Progress Notes (Signed)
Patient ID: Laura Hopkins, female    DOB: June 04, 1946  Age: 73 y.o. MRN: [id number]    Subjective:  Subjective  HPI Laura Hopkins presents for f/u migraines.  They are worsening and becoming more frequent --- she gets them 12 days a month   Pt tried topamax, effexor,  botox    She has had several MRI in the past and Lumbar puncture -----  She was doing well with the imitrex but it is not working as well lately   Review of Systems  Constitutional: Negative for appetite change, diaphoresis, fatigue and unexpected weight change.  Eyes: Negative for pain, redness and visual disturbance.  Respiratory: Negative for cough, chest tightness, shortness of breath and wheezing.   Cardiovascular: Negative for chest pain, palpitations and leg swelling.  Endocrine: Negative for cold intolerance, heat intolerance, polydipsia, polyphagia and polyuria.  Genitourinary: Negative for difficulty urinating, dysuria and frequency.  Neurological: Negative for dizziness, light-headedness, numbness and headaches.    History Past Medical History:  Diagnosis Date  . Acute pharyngitis 02/26/2009   Qualifier: Diagnosis of  By: Jerold Coombe    . Arthritis    hands and neck   . Essential hypertension 02/07/2007   Qualifier: Diagnosis of  By: Jerold Coombe    . Headache(784.0)   . Insomnia 12/23/2017  . Lower extremity edema 07/31/2017  . Migraine 05/23/2017  . SINUSITIS - ACUTE-NOS 03/17/2009   Qualifier: Diagnosis of  By: Jerold Coombe    . Skin cancer    basal cell  . 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 Amy Martinique   . URI 02/07/2007   Qualifier: Diagnosis of  By: Jerold Coombe      She has a past surgical history that includes Abdominal hysterectomy (1995); Shoulder surgery (2000); Nasal septum surgery (1983); Colonoscopy (2007); Wisdom tooth extraction; Tubal ligation  (1978); and Eye surgery (Bilateral, 03/03/2017).   Her family history includes Arthritis in her father; Dementia in her mother; Diabetes in her father; Hypertension in her father and mother; Prostate cancer in her father; Skin cancer in her brother.She reports that she quit smoking about 20 years ago. Her smoking use included cigarettes. She smoked 0.30 packs per day. She has never used smokeless tobacco. She reports that she does not drink alcohol or use drugs.  Current Outpatient Medications on File Prior to Visit  Medication Sig Dispense Refill  . acetaminophen (TYLENOL) 500 MG tablet Take 500 mg by mouth every 6 (six) hours as needed.    . ALPRAZolam (XANAX) 0.25 MG tablet Take 1 tablet by mouth 2 (two) times daily as needed.  1  . Apoaequorin (PREVAGEN PO) Take 1 tablet by mouth daily.    . bimatoprost (LATISSE) 0.03 % ophthalmic solution Place 1 drop into both eyes 2 (two) times a day.    . calcium carbonate (OS-CAL) 600 MG TABS tablet Take 1 tablet by mouth 2 (two) times daily with a meal.    . estradiol (VIVELLE-DOT) 0.05 MG/24HR Place 1 patch onto the skin once a week.      . Flaxseed, Linseed, (FLAX SEED OIL) 1000 MG CAPS Take 1 capsule by mouth daily.    . Glucosamine-Chondroitin (OSTEO BI-FLEX REGULAR STRENGTH PO) Take 1 tablet by mouth daily.     . naproxen sodium (ALEVE) 220 MG tablet Take 220 mg by mouth daily  as needed.    . Omega-3 Fatty Acids (FISH OIL) 1000 MG CAPS Take 1 capsule by mouth daily.     . promethazine (PHENERGAN) 25 MG tablet Take 1 tablet (25 mg total) by mouth every 8 (eight) hours as needed for nausea or vomiting. 20 tablet 0  . spironolactone (ALDACTONE) 25 MG tablet Take 0.5 tablets (12.5 mg total) by mouth daily. 90 tablet 3  . temazepam (RESTORIL) 30 MG capsule TAKE 1 CAPSULE (30 MG TOTAL) BY MOUTH AT BEDTIME AS NEEDED FOR SLEEP. 30 capsule 2  . VITAMIN D PO Take 1,000 mg by mouth daily.     No current facility-administered medications on file prior to  visit.      Objective:  Objective  Physical Exam Vitals signs and nursing note reviewed.  Constitutional:      Appearance: She is well-developed.  HENT:     Head: Normocephalic and atraumatic.  Eyes:     Conjunctiva/sclera: Conjunctivae normal.  Neck:     Musculoskeletal: Normal range of motion and neck supple.     Thyroid: No thyromegaly.     Vascular: No carotid bruit or JVD.  Cardiovascular:     Rate and Rhythm: Normal rate and regular rhythm.     Heart sounds: Normal heart sounds. No murmur.  Pulmonary:     Effort: Pulmonary effort is normal. No respiratory distress.     Breath sounds: Normal breath sounds. No wheezing or rales.  Chest:     Chest wall: No tenderness.  Neurological:     Mental Status: She is alert and oriented to person, place, and time.    BP 130/80 (BP Location: Left Arm, Patient Position: Sitting, Cuff Size: Normal)   Pulse 64   Temp 97.9 F (36.6 C) (Temporal)   Resp 18   Ht 5\' 4"  (1.626 m)   Wt 152 lb 9.6 oz (69.2 kg)   SpO2 98%   BMI 26.19 kg/m  Wt Readings from Last 3 Encounters:  09/05/19 152 lb 9.6 oz (69.2 kg)  07/04/19 153 lb 12.8 oz (69.8 kg)  04/10/19 150 lb 6.4 oz (68.2 kg)     Lab Results  Component Value Date   WBC 6.6 12/22/2017   HGB 13.7 12/22/2017   HCT 41.1 12/22/2017   PLT 258.0 12/22/2017   GLUCOSE 88 03/11/2019   CHOL 177 05/31/2018   TRIG 107.0 05/31/2018   HDL 59.40 05/31/2018   LDLCALC 96 05/31/2018   ALT 5 05/31/2018   AST 15 05/31/2018   NA 139 03/11/2019   K 4.3 03/11/2019   CL 97 03/11/2019   CREATININE 0.94 03/11/2019   BUN 13 03/11/2019   CO2 24 03/11/2019   TSH 1.21 03/07/2011    Vas US Renal Artery Duplex  Result Date: 03/24/2019 ABDOMINAL VISCERAL Indications: HTN High Risk Factors: Hypertension. Performing Technologist: Cardell Peach RDCS, RVT  Examination Guidelines: A complete evaluation includes B-mode imaging, spectral Doppler, color Doppler, and power Doppler as needed of all  accessible portions of each vessel. Bilateral testing is considered an integral part of a complete examination. Limited examinations for reoccurring indications may be performed as noted.  Duplex Findings: +--------------------+--------+--------+------+--------+ Mesenteric          PSV cm/sEDV cm/sPlaqueComments +--------------------+--------+--------+------+--------+ Celiac Artery Origin  104                          +--------------------+--------+--------+------+--------+ SMA Proximal          194                          +--------------------+--------+--------+------+--------+  +------------------+--------+--------+-------+  Right Renal ArteryPSV cm/sEDV cm/sComment +------------------+--------+--------+-------+ Proximal            124      46           +------------------+--------+--------+-------+ Mid                  76      37           +------------------+--------+--------+-------+ Distal              107      48           +------------------+--------+--------+-------+ +-----------------+--------+--------+-------+ Left Renal ArteryPSV cm/sEDV cm/sComment +-----------------+--------+--------+-------+ Origin              83      33           +-----------------+--------+--------+-------+ Proximal            79      30           +-----------------+--------+--------+-------+ Mid                 68      37           +-----------------+--------+--------+-------+ Distal              62      25           +-----------------+--------+--------+-------+ +------------+--------+--------+----+-----------+--------+--------+----+ Right KidneyPSV cm/sEDV cm/sRI  Left KidneyPSV cm/sEDV cm/sRI   +------------+--------+--------+----+-----------+--------+--------+----+ Upper Pole  35      14      0.64Upper Pole 29      8       0.72 +------------+--------+--------+----+-----------+--------+--------+----+ Mid         31      12      0.60Mid         17      8       0.53 +------------+--------+--------+----+-----------+--------+--------+----+ Lower Pole  24      10      0.58Lower Pole 21      9       0.57 +------------+--------+--------+----+-----------+--------+--------+----+ Hilar       35      18      0.54Hilar      34      18      0.47 +------------+--------+--------+----+-----------+--------+--------+----+ +------------------+----+------------------+----+ Right Kidney          Left Kidney            +------------------+----+------------------+----+ RAR                   RAR                    +------------------+----+------------------+----+ RAR (manual)      1.9 RAR (manual)      1.5  +------------------+----+------------------+----+ Cortex                Cortex                 +------------------+----+------------------+----+ Cortex thickness      Corex thickness        +------------------+----+------------------+----+ Kidney length (cm)9.40Kidney length (cm)9.63 +------------------+----+------------------+----+  Summary: Renal:  Right: Normal size right kidney. Abnormal right Resistive Index. No        evidence of right renal artery stenosis. Left:  Normal size of left kidney. No evidence of left renal artery        stenosis. Normal left Resistive Index. Mesenteric:  *See table(s) above for  measurements and observations.  Diagnosing physician: Shirlee More MD  Electronically signed by Shirlee More MD on 03/24/2019 at 11:02:55 AM.    Final      Assessment & Plan:  Plan  I am having [name] maintain her estradiol, Glucosamine-Chondroitin (OSTEO BI-FLEX REGULAR STRENGTH PO), ALPRAZolam, Fish Oil, Flax Seed Oil, promethazine, VITAMIN D PO, calcium carbonate, bimatoprost, Apoaequorin (PREVAGEN PO), acetaminophen, naproxen sodium, temazepam, spironolactone, and SUMAtriptan.  Meds ordered this encounter  Medications  . SUMAtriptan (IMITREX) 100 MG tablet    Sig: TAKE 1 TABLET (100 MG TOTAL) BY  MOUTH AS NEEDED.MAX ON INSURANCE    Dispense:  10 tablet    Refill:  3    Problem List Items Addressed This Visit      Unprioritized   Migraine - Primary   Relevant Medications   SUMAtriptan (IMITREX) 100 MG tablet   Other Relevant Orders   Ambulatory referral to Neurology      Follow-up: No follow-ups on file.  Ann Held, DO

## 2019-09-09 ENCOUNTER — Other Ambulatory Visit: Payer: Self-pay | Admitting: Family Medicine

## 2019-09-09 ENCOUNTER — Encounter: Payer: Self-pay | Admitting: Family Medicine

## 2019-09-09 DIAGNOSIS — G43509 Persistent migraine aura without cerebral infarction, not intractable, without status migrainosus: Secondary | ICD-10-CM

## 2019-09-09 MED ORDER — AIMOVIG 70 MG/ML ~~LOC~~ SOAJ: 70.0000 mg | SUBCUTANEOUS | 1 refills | Status: DC

## 2019-09-09 NOTE — Telephone Encounter (Signed)
I sent it in to pharmacy

## 2019-09-10 ENCOUNTER — Telehealth: Payer: Self-pay

## 2019-09-10 NOTE — Telephone Encounter (Signed)
PA approved. Effective 10/03/2018 to 12/09/2019.

## 2019-09-10 NOTE — Telephone Encounter (Signed)
PA initiated via Covermymeds; KEY: [id number]. Awaiting determination.

## 2019-09-22 DIAGNOSIS — Z20828 Contact with and (suspected) exposure to other viral communicable diseases: Secondary | ICD-10-CM | POA: Diagnosis not present

## 2019-09-24 ENCOUNTER — Telehealth: Payer: Self-pay

## 2019-09-24 NOTE — Telephone Encounter (Signed)
Copied from [place] [id number]. Topic: General - Other >> Sep 24, 2019  3:39 PM [name] wrote: Reason for CRM: Pt called saying she went to an urgent care on Sunday for cough and SOB.  They did a covid test and it was negative.  She would like to schedule an appt with Dr. Etter Sjogren or someone in the office asap.  She is still having some SOB and cough.  They did not give her any medication.   They told her her BP was high when she was there and they advised her to go to the ER.  It was 188/103.  Pt declined to go to the ER.   She has been taking her BP and it has came back down.  They did a CXR at the urgent care and it was normal.  CB#  [phone]

## 2019-09-24 NOTE — Telephone Encounter (Signed)
Spoke with patient. BP has come down to 138/88. Pt states having SOB after walking her dog. I set patient up for a virtual tomorrow. See phone note

## 2019-09-25 ENCOUNTER — Encounter: Payer: Self-pay | Admitting: Family Medicine

## 2019-09-25 ENCOUNTER — Other Ambulatory Visit: Payer: Self-pay

## 2019-09-25 ENCOUNTER — Ambulatory Visit (INDEPENDENT_AMBULATORY_CARE_PROVIDER_SITE_OTHER): Payer: Medicare HMO | Admitting: Family Medicine

## 2019-09-25 VITALS — BP 152/87 | HR 63 | Ht 64.0 in

## 2019-09-25 DIAGNOSIS — J40 Bronchitis, not specified as acute or chronic: Secondary | ICD-10-CM

## 2019-09-25 DIAGNOSIS — I10 Essential (primary) hypertension: Secondary | ICD-10-CM | POA: Diagnosis not present

## 2019-09-25 DIAGNOSIS — R519 Headache, unspecified: Secondary | ICD-10-CM

## 2019-09-25 MED ORDER — AZITHROMYCIN 250 MG PO TABS: ORAL_TABLET | ORAL | 0 refills | Status: DC

## 2019-09-25 MED ORDER — PROMETHAZINE HCL 25 MG PO TABS: 25.0000 mg | ORAL_TABLET | Freq: Three times a day (TID) | ORAL | 0 refills | Status: DC | PRN

## 2019-09-25 MED ORDER — SPIRONOLACTONE 25 MG PO TABS: 25.0000 mg | ORAL_TABLET | Freq: Every day | ORAL | 3 refills | Status: DC

## 2019-09-25 MED ORDER — PREDNISONE 10 MG PO TABS: ORAL_TABLET | ORAL | 0 refills | Status: DC

## 2019-09-25 MED ORDER — ALBUTEROL SULFATE HFA 108 (90 BASE) MCG/ACT IN AERS: 2.0000 | INHALATION_SPRAY | Freq: Four times a day (QID) | RESPIRATORY_TRACT | 2 refills | Status: DC | PRN

## 2019-09-25 NOTE — Progress Notes (Signed)
Virtual Visit via Video Note  I connected with Laura Hopkins on 09/25/19 at  9:20 AM EST by a video enabled telemedicine application and verified that I am speaking with the correct person using two identifiers.  Location: Patient: home  Provider: home    I discussed the limitations of evaluation and management by telemedicine and the availability of in person appointments. The patient expressed understanding and agreed to proceed.  History of Present Illness: Pt is home c/o sob/ cough/ wheezing and she went to [facility] Sunday.  covid was neg   But she had inc sob when walking her dog.   Observations/Objective: Vitals:   09/25/19 0913 09/25/19 0914  BP: (!) 132/93 (!) 152/87  Pulse: 73 63   o2 sat Sunday was 96%  Assessment and Plan: 1. Acute nonintractable headache, unspecified headache type Stable Refill phenergan for nausea with migraine - promethazine (PHENERGAN) 25 MG tablet; Take 1 tablet (25 mg total) by mouth every 8 (eight) hours as needed for nausea or vomiting.  Dispense: 20 tablet; Refill: 0  2. Essential hypertension Poorly controlled will alter medications, encouraged DASH diet, minimize caffeine and obtain adequate sleep. Report concerning symptoms and follow up as directed and as needed - spironolactone (ALDACTONE) 25 MG tablet; Take 1 tablet (25 mg total) by mouth daily.  Dispense: 90 tablet; Refill: 3  3. Bronchitis Abx, pred taper , inhaler  F/u 2 weeks or sooner prn  - predniSONE (DELTASONE) 10 MG tablet; TAKE 3 TABLETS PO QD FOR 3 DAYS THEN TAKE 2 TABLETS PO QD FOR 3 DAYS THEN TAKE 1 TABLET PO QD FOR 3 DAYS THEN TAKE 1/2 TAB PO QD FOR 3 DAYS  Dispense: 20 tablet; Refill: 0 - azithromycin (ZITHROMAX Z-PAK) 250 MG tablet; As directed  Dispense: 6 each; Refill: 0 - albuterol (VENTOLIN HFA) 108 (90 Base) MCG/ACT inhaler; Inhale 2 puffs into the lungs every 6 (six) hours as needed for wheezing or shortness of breath.  Dispense: 18 g; Refill: 2   Follow Up  Instructions:    I discussed the assessment and treatment plan with the patient. The patient was provided an opportunity to ask questions and all were answered. The patient agreed with the plan and demonstrated an understanding of the instructions.   The patient was advised to call back or seek an in-person evaluation if the symptoms worsen or if the condition fails to improve as anticipated.  I provided 15  minutes of non-face-to-face time during this encounter.   Ann Held, DO

## 2019-10-21 ENCOUNTER — Ambulatory Visit: Payer: Medicare HMO | Admitting: Neurology

## 2019-10-24 ENCOUNTER — Ambulatory Visit: Payer: Medicare HMO | Attending: Internal Medicine

## 2019-10-24 DIAGNOSIS — Z23 Encounter for immunization: Secondary | ICD-10-CM

## 2019-10-24 NOTE — Progress Notes (Signed)
   Covid-19 Vaccination Clinic  Name:  Dorace Scinta    MRN: [id number] DOB: Jan 31, 1946  10/24/2019  Ms. Rulon was observed post Covid-19 immunization for 15 minutes without incidence. She was provided with Vaccine Information Sheet and instruction to access the V-Safe system.   Ms. Dargenio was instructed to call 911 with any severe reactions post vaccine: Marland Kitchen Difficulty breathing  . Swelling of your face and throat  . A fast heartbeat  . A bad rash all over your body  . Dizziness and weakness    Immunizations Administered    Name Date Dose VIS Date Route   Pfizer COVID-19 Vaccine 10/24/2019 11:11 AM 0.3 mL 09/13/2019 Intramuscular   Manufacturer: [place]   Lot: [id number]   [place]: [id number]

## 2019-11-06 ENCOUNTER — Ambulatory Visit (INDEPENDENT_AMBULATORY_CARE_PROVIDER_SITE_OTHER): Payer: Medicare HMO | Admitting: Family Medicine

## 2019-11-06 ENCOUNTER — Other Ambulatory Visit: Payer: Self-pay

## 2019-11-06 ENCOUNTER — Encounter: Payer: Self-pay | Admitting: Family Medicine

## 2019-11-06 ENCOUNTER — Ambulatory Visit (HOSPITAL_COMMUNITY): Admission: RE | Admit: 2019-11-06 | Payer: Medicare HMO | Source: Ambulatory Visit

## 2019-11-06 VITALS — BP 140/78 | HR 73 | Temp 98.2°F | Resp 16 | Wt 153.0 lb

## 2019-11-06 VITALS — BP 128/87 | Wt 150.0 lb

## 2019-11-06 DIAGNOSIS — Z20822 Contact with and (suspected) exposure to covid-19: Secondary | ICD-10-CM

## 2019-11-06 DIAGNOSIS — J40 Bronchitis, not specified as acute or chronic: Secondary | ICD-10-CM | POA: Diagnosis not present

## 2019-11-06 DIAGNOSIS — R05 Cough: Secondary | ICD-10-CM

## 2019-11-06 DIAGNOSIS — R059 Cough, unspecified: Secondary | ICD-10-CM

## 2019-11-06 DIAGNOSIS — R062 Wheezing: Secondary | ICD-10-CM | POA: Diagnosis not present

## 2019-11-06 DIAGNOSIS — J45909 Unspecified asthma, uncomplicated: Secondary | ICD-10-CM | POA: Diagnosis not present

## 2019-11-06 DIAGNOSIS — R0602 Shortness of breath: Secondary | ICD-10-CM | POA: Diagnosis not present

## 2019-11-06 MED ORDER — PREDNISONE 20 MG PO TABS: 40.0000 mg | ORAL_TABLET | Freq: Every day | ORAL | 0 refills | Status: AC

## 2019-11-06 MED ORDER — CEFDINIR 300 MG PO CAPS: 600.0000 mg | ORAL_CAPSULE | Freq: Every day | ORAL | 0 refills | Status: DC

## 2019-11-06 MED ORDER — FLUCONAZOLE 150 MG PO TABS: 150.0000 mg | ORAL_TABLET | Freq: Once | ORAL | 0 refills | Status: AC

## 2019-11-06 NOTE — Assessment & Plan Note (Addendum)
Pt to be seen in resp clinic tonight Pt was well for 3 weeks before getting sick again  May need pulm referral

## 2019-11-06 NOTE — Progress Notes (Signed)
Virtual Visit via Video Note  I connected with Laura Hopkins on 11/06/19 at  8:20 AM EST by a video enabled telemedicine application and verified that I am speaking with the correct person using two identifiers.  Location: Patient: home  Provider: home    I discussed the limitations of evaluation and management by telemedicine and the availability of in person appointments. The patient expressed understanding and agreed to proceed.  History of Present Illness: Pt is home c/o cough and wheezing since end of January -- about 26 th    + sob and heaviness in the chest   No fevers   she had her first covid vaccine 1/21 and second one scheduled for feb 11   Observations/Objective: Vitals:   11/06/19 0815  BP: 128/87   Pt Is in nAD  Assessment and Plan: 1. Bronchitis resp clinic  Consider pulm referral   Follow Up Instructions:    I discussed the assessment and treatment plan with the patient. The patient was provided an opportunity to ask questions and all were answered. The patient agreed with the plan and demonstrated an understanding of the instructions.   The patient was advised to call back or seek an in-person evaluation if the symptoms worsen or if the condition fails to improve as anticipated.  I provided 15 minutes of non-face-to-face time during this encounter.   Ann Held, DO

## 2019-11-06 NOTE — Patient Instructions (Addendum)
For x-ray imaging:  8:00 am -4:30 (for chest x-ray) [facility]  [place], [place], [place] [id number] Enter through Main Entrance and request x-ray department. You will be contacted either via phone or via [place] with your x-ray result.   .Your COVID 19 results will be available in 48-72 hours. Negative results are immediately resulted to Mychart. All positive results are communicated with a phone call from our office.    Benzonatate during the day for cough. Promethazine-DM only at bedtime as needed cough. Cefdinir 600 mg daily x 10 days Prednisone 40 mg x 5 day with breakfast.   If symptoms worsen, notify your PCP or go immediately to Emergency Department

## 2019-11-06 NOTE — Progress Notes (Signed)
Patient ID: Laura Hopkins, female    DOB: 07-Dec-1945, 74 y.o.   MRN: [id number]  PCP: Ann Held, DO  Chief Complaint  Patient presents with  . Cough    started May/2020 with sob,wheezing,fatigue congestion    Subjective:  HPI  Laura Hopkins is a 74 y.o. female presents to [facility] Respiratory clinic for evaluation of recurrent respiratory symptoms. Current symptoms include SOB, wheezing, fatigue, and congestion. Treated for acute bronchitis back in December. Her symptoms improved with antibiotic and steroid. Current symptoms developed 10/29/19, shortness of breath, wheezing, cough, and congestion. Afebrile. Neck and shoulder achiness, although no overt generalized body. She is afebrile. History of sinusitis and shortness of breath. She is a former smoker (quit 03/1999).   Review of Systems Pertinent negatives listed in HPI  Patient Active Problem List   Diagnosis Date Noted  . Bronchitis 11/06/2019  . SOB (shortness of breath) 03/11/2019  . Insomnia 12/23/2017  . Lower extremity edema 07/31/2017  . Migraine 05/23/2017  . SINUSITIS - ACUTE-NOS 03/17/2009  . ACUTE PHARYNGITIS 02/26/2009  . SKIN CANCER, HX OF 01/08/2008  . Essential hypertension 02/07/2007  . URI 02/07/2007  . HEADACHE 02/07/2007      Prior to Admission medications   Medication Sig Start Date End Date Taking? Authorizing Provider  acetaminophen (TYLENOL) 500 MG tablet Take 500 mg by mouth every 6 (six) hours as needed.    [provider]  albuterol (VENTOLIN HFA) 108 (90 Base) MCG/ACT inhaler Inhale 2 puffs into the lungs every 6 (six) hours as needed for wheezing or shortness of breath. 09/25/19   Ann Held, DO  ALPRAZolam (XANAX) 0.25 MG tablet Take 1 tablet by mouth 2 (two) times daily as needed. 08/13/15   [provider]  Apoaequorin (PREVAGEN PO) Take 1 tablet by mouth daily.    [provider]  bimatoprost (LATISSE) 0.03 % ophthalmic solution Place 1 drop into  both eyes 2 (two) times a day. 10/30/18   [provider]  calcium carbonate (OS-CAL) 600 MG TABS tablet Take 1 tablet by mouth 2 (two) times daily with a meal.    [provider]  Erenumab-aooe (AIMOVIG) 70 MG/ML SOAJ Inject 70 mg into the skin every 30 (thirty) days. 09/09/19   Ann Held, DO  estradiol (VIVELLE-DOT) 0.05 MG/24HR Place 1 patch onto the skin once a week.      [provider]  Flaxseed, Linseed, (FLAX SEED OIL) 1000 MG CAPS Take 1 capsule by mouth daily.    [provider]  Glucosamine-Chondroitin (OSTEO BI-FLEX REGULAR STRENGTH PO) Take 1 tablet by mouth daily.     [provider]  naproxen sodium (ALEVE) 220 MG tablet Take 220 mg by mouth daily as needed.    [provider]  Omega-3 Fatty Acids (FISH OIL) 1000 MG CAPS Take 1 capsule by mouth daily.     [provider]  predniSONE (DELTASONE) 10 MG tablet TAKE 3 TABLETS PO QD FOR 3 DAYS THEN TAKE 2 TABLETS PO QD FOR 3 DAYS THEN TAKE 1 TABLET PO QD FOR 3 DAYS THEN TAKE 1/2 TAB PO QD FOR 3 DAYS Patient not taking: Reported on 11/06/2019 09/25/19   Ann Held, DO  promethazine (PHENERGAN) 25 MG tablet Take 1 tablet (25 mg total) by mouth every 8 (eight) hours as needed for nausea or vomiting. 09/25/19   Ann Held, DO  spironolactone (ALDACTONE) 25 MG tablet Take 1 tablet (25 mg total) by  mouth daily. 09/25/19 12/24/19  Ann Held, DO  SUMAtriptan (IMITREX) 100 MG tablet TAKE 1 TABLET (100 MG TOTAL) BY MOUTH AS NEEDED.MAX ON INSURANCE 09/05/19   Carollee Herter, Kendrick Fries R, DO  temazepam (RESTORIL) 30 MG capsule TAKE 1 CAPSULE (30 MG TOTAL) BY MOUTH AT BEDTIME AS NEEDED FOR SLEEP. 03/12/19   Carollee Herter, Alferd Apa, DO  VITAMIN D PO Take 1,000 mg by mouth daily.    [provider]    Past Medical, Surgical Family and Social History reviewed and updated.    Objective:   Today's Vitals   11/06/19 1809  BP: 140/78  Pulse: 73   Resp: 16  Temp: 98.2 F (36.8 C)  SpO2: 96%  Weight: 153 lb (69.4 kg)    Wt Readings from Last 3 Encounters:  11/06/19 153 lb (69.4 kg)  11/06/19 150 lb (68 kg)  09/05/19 152 lb 9.6 oz (69.2 kg)   Physical Exam Constitutional:      Appearance: She is ill-appearing.  HENT:     Nose: Congestion present.  Cardiovascular:     Rate and Rhythm: Normal rate and regular rhythm.     Pulses: Normal pulses.     Heart sounds: Normal heart sounds.  Pulmonary:     Breath sounds: Wheezing and rhonchi present.  Lymphadenopathy:     Cervical: No cervical adenopathy.  Skin:    General: Skin is warm.  Neurological:     Mental Status: She is oriented to person, place, and time.  Psychiatric:        Mood and Affect: Mood normal.          Assessment & Plan:  1. Wheezing -Albuterol inhaler 2 puffs every 4-6 hours asn -Prednisone 40 mg x 5 days  -Covering with antibiotic for possible Bronchitis vs PNA, Cefdinir 600 mg daily x 10 days  - Novel Coronavirus, NAA ([facility]), pending  -Chest X-ray pending   2. Reactive airway disease without complication, unspecified asthma severity, unspecified whether persistent See # 1   3. Shortness of breath See # 1 - Novel Coronavirus, NAA ([facility]) - CBC with Differential/Platelet; Future - Comp Met (CMET); Future - Comp Met (CMET) - CBC with Differential/Platelet  4. Encounter for laboratory testing for COVID-19 virus -COVID-19 test pending. Work note provided for 72 hours to allow time for test to result. Patient encouraged to self isolate while test is pending.   Return for follow-up here at the respiratory clinic  7-10 days.   -The patient was given clear instructions to go to ER or return to medical center if symptoms do not improve, worsen or new problems develop. The patient verbalized understanding.     Molli Barrows, FNP-C [facility] Res[facility], PRN Provider  [facility] [place], [place] Clinic Phone:  [phone] Clinic Fax: [phone] Clinic Hours: 5:30 pm -7:30 pm (Monday-Friday)

## 2019-11-07 ENCOUNTER — Ambulatory Visit (HOSPITAL_COMMUNITY)
Admission: RE | Admit: 2019-11-07 | Discharge: 2019-11-07 | Disposition: A | Discharge status: Home / Self Care | Payer: Medicare HMO | Source: Ambulatory Visit | Attending: Family Medicine | Admitting: Family Medicine

## 2019-11-07 ENCOUNTER — Other Ambulatory Visit: Payer: Self-pay

## 2019-11-07 ENCOUNTER — Other Ambulatory Visit: Payer: Self-pay | Admitting: Family Medicine

## 2019-11-07 DIAGNOSIS — R059 Cough, unspecified: Secondary | ICD-10-CM

## 2019-11-07 DIAGNOSIS — R05 Cough: Secondary | ICD-10-CM | POA: Diagnosis not present

## 2019-11-07 LAB — COMPREHENSIVE METABOLIC PANEL
ALT: 8 IU/L (ref 0–32)
AST: 21 IU/L (ref 0–40)
Albumin/Globulin Ratio: 1.7 (ref 1.2–2.2)
Albumin: 4.3 g/dL (ref 3.7–4.7)
Alkaline Phosphatase: 72 IU/L (ref 39–117)
BUN/Creatinine Ratio: 14 (ref 12–28)
BUN: 12 mg/dL (ref 8–27)
Bilirubin Total: 0.2 mg/dL (ref 0.0–1.2)
CO2: 25 mmol/L (ref 20–29)
Calcium: 10 mg/dL (ref 8.7–10.3)
Chloride: 103 mmol/L (ref 96–106)
Creatinine, Ser: 0.85 mg/dL (ref 0.57–1.00)
GFR calc Af Amer: 80 mL/min/{1.73_m2} (ref >59)
GFR calc non Af Amer: 69 mL/min/{1.73_m2} (ref >59)
Globulin, Total: 2.6 g/dL (ref 1.5–4.5)
Glucose: 91 mg/dL (ref 65–99)
Potassium: 5.2 mmol/L (ref 3.5–5.2)
Sodium: 141 mmol/L (ref 134–144)
Total Protein: 6.9 g/dL (ref 6.0–8.5)

## 2019-11-07 LAB — CBC WITH DIFFERENTIAL/PLATELET
Basophils Absolute: 0.1 10*3/uL (ref 0.0–0.2)
Basos: 1 %
EOS (ABSOLUTE): 0.4 10*3/uL (ref 0.0–0.4)
Eos: 5 %
Hematocrit: 45.4 % (ref 34.0–46.6)
Hemoglobin: 15 g/dL (ref 11.1–15.9)
Immature Grans (Abs): 0 10*3/uL (ref 0.0–0.1)
Immature Granulocytes: 0 %
Lymphocytes Absolute: 2.1 10*3/uL (ref 0.7–3.1)
Lymphs: 25 %
MCH: 31.1 pg (ref 26.6–33.0)
MCHC: 33 g/dL (ref 31.5–35.7)
MCV: 94 fL (ref 79–97)
Monocytes Absolute: 0.7 10*3/uL (ref 0.1–0.9)
Monocytes: 8 %
Neutrophils Absolute: 5.1 10*3/uL (ref 1.4–7.0)
Neutrophils: 61 %
Platelets: 298 10*3/uL (ref 150–450)
RBC: 4.83 x10E6/uL (ref 3.77–5.28)
RDW: 13.2 % (ref 11.7–15.4)
WBC: 8.4 10*3/uL (ref 3.4–10.8)

## 2019-11-07 MED ORDER — PROMETHAZINE-DM 6.25-15 MG/5ML PO SYRP: 5.0000 mL | ORAL_SOLUTION | Freq: Every evening | ORAL | 0 refills | Status: DC | PRN

## 2019-11-07 MED ORDER — BENZONATATE 100 MG PO CAPS: 100.0000 mg | ORAL_CAPSULE | Freq: Three times a day (TID) | ORAL | 0 refills | Status: DC | PRN

## 2019-11-07 NOTE — Progress Notes (Signed)
Prescription sent for promethazine DM and Benzonatate

## 2019-11-08 LAB — NOVEL CORONAVIRUS, NAA: SARS-CoV-2, NAA: NOT DETECTED

## 2019-11-13 ENCOUNTER — Ambulatory Visit (INDEPENDENT_AMBULATORY_CARE_PROVIDER_SITE_OTHER): Payer: Medicare HMO | Admitting: Family Medicine

## 2019-11-13 VITALS — BP 136/82 | HR 72 | Temp 98.5°F | Resp 12 | Ht 64.0 in | Wt 157.0 lb

## 2019-11-13 DIAGNOSIS — J45909 Unspecified asthma, uncomplicated: Secondary | ICD-10-CM

## 2019-11-13 MED ORDER — FLOVENT HFA 110 MCG/ACT IN AERO: 2.0000 | INHALATION_SPRAY | Freq: Two times a day (BID) | RESPIRATORY_TRACT | 6 refills | Status: DC | PRN

## 2019-11-13 NOTE — Progress Notes (Signed)
Patient ID: Laura Hopkins, female    DOB: 06/28/1946, 74 y.o.   MRN: [id number]  PCP: Ann Held, DO  Chief Complaint  Patient presents with  . Follow-up    Subjective:  HPI Laura Hopkins is a 74 y.o. female presents to [facility] for follow-up of reactive airway symptoms and bronchitis.  Patient previously seen here at clinic on 11/06/2019 and treated for acute bronchitis related to reactive airway disease. She was placed on a 10-day course of Cefdinir, prednisone, and Albuterol inhaler. Today, she reports resolution of symptoms. She notes a slight increase in WOB x 2 days since completing prednisone. She is using Flonase. She is not prescribed a maintenance inhaler.  Denies coughing, chest pain, weakness, or wheezing.    Review of Systems Pertinent negatives listed in HPI  Patient Active Problem List   Diagnosis Date Noted  . Bronchitis 11/06/2019  . SOB (shortness of breath) 03/11/2019  . Insomnia 12/23/2017  . Lower extremity edema 07/31/2017  . Migraine 05/23/2017  . SINUSITIS - ACUTE-NOS 03/17/2009  . ACUTE PHARYNGITIS 02/26/2009  . SKIN CANCER, HX OF 01/08/2008  . Essential hypertension 02/07/2007  . URI 02/07/2007  . HEADACHE 02/07/2007      Prior to Admission medications   Medication Sig Start Date End Date Taking? Authorizing Provider  acetaminophen (TYLENOL) 500 MG tablet Take 500 mg by mouth every 6 (six) hours as needed.   Yes [provider]  albuterol (VENTOLIN HFA) 108 (90 Base) MCG/ACT inhaler Inhale 2 puffs into the lungs every 6 (six) hours as needed for wheezing or shortness of breath. 09/25/19  Yes Ann Held, DO  ALPRAZolam Duanne Moron) 0.25 MG tablet Take 1 tablet by mouth 2 (two) times daily as needed. 08/13/15  Yes [provider]  Apoaequorin (PREVAGEN PO) Take 1 tablet by mouth daily.   Yes [provider]  benzonatate (TESSALON) 100 MG capsule Take 1-2 capsules (100-200 mg total) by mouth 3  (three) times daily as needed for cough. 11/07/19  Yes Scot Jun, FNP  bimatoprost (LATISSE) 0.03 % ophthalmic solution Place 1 drop into both eyes 2 (two) times a day. 10/30/18  Yes [provider]  calcium carbonate (OS-CAL) 600 MG TABS tablet Take 1 tablet by mouth 2 (two) times daily with a meal.   Yes [provider]  cefdinir (OMNICEF) 300 MG capsule Take 2 capsules (600 mg total) by mouth daily. 11/06/19  Yes Scot Jun, FNP  Erenumab-aooe (AIMOVIG) 70 MG/ML SOAJ Inject 70 mg into the skin every 30 (thirty) days. 09/09/19  Yes Roma Schanz R, DO  estradiol (VIVELLE-DOT) 0.05 MG/24HR Place 1 patch onto the skin once a week.     Yes [provider]  Flaxseed, Linseed, (FLAX SEED OIL) 1000 MG CAPS Take 1 capsule by mouth daily.   Yes [provider]  Glucosamine-Chondroitin (OSTEO BI-FLEX REGULAR STRENGTH PO) Take 1 tablet by mouth daily.    Yes [provider]  naproxen sodium (ALEVE) 220 MG tablet Take 220 mg by mouth daily as needed.   Yes [provider]  Omega-3 Fatty Acids (FISH OIL) 1000 MG CAPS Take 1 capsule by mouth daily.    Yes [provider]  spironolactone (ALDACTONE) 25 MG tablet Take 1 tablet (25 mg total) by mouth daily. 09/25/19 12/24/19 Yes Roma Schanz R, DO  SUMAtriptan (IMITREX) 100 MG tablet TAKE 1 TABLET (100 MG TOTAL) BY MOUTH AS NEEDED.MAX ON INSURANCE 09/05/19  Yes Lowne  Lyndal Pulley R, DO  temazepam (RESTORIL) 30 MG capsule TAKE 1 CAPSULE (30 MG TOTAL) BY MOUTH AT BEDTIME AS NEEDED FOR SLEEP. 03/12/19  Yes Ann Held, DO  VITAMIN D PO Take 1,000 mg by mouth daily.   Yes [provider]    Past Medical, Surgical Family and Social History reviewed and updated.    Objective:   Today's Vitals   11/13/19 1817  BP: 136/82  Pulse: 72  Resp: 12  Temp: 98.5 F (36.9 C)  TempSrc: Oral  SpO2: 98%  Weight: 157 lb (71.2 kg)  Height: 5\' 4"  (1.626 m)    Wt  Readings from Last 3 Encounters:  11/13/19 157 lb (71.2 kg)  11/06/19 153 lb (69.4 kg)  11/06/19 150 lb (68 kg)     Physical Exam Constitutional: Patient appears well-developed and well-nourished. No distress. HENT: Normocephalic, atraumatic, External right and left ear normal. Oropharynx is clear and moist.  Eyes: Conjunctivae and EOM are normal. PERRLA, no scleral icterus. Neck: Normal ROM. Neck supple. No JVD. No tracheal deviation. No thyromegaly. CVS: RRR, S1/S2 +, no murmurs, no gallops, no carotid bruit.  Pulmonary: Effort and breath sounds normal, no stridor, rhonchi, wheezes, rales.  Skin: Skin is warm and dry. No rash noted. Not diaphoretic. No erythema. No pallor. Psychiatric: Normal mood and affect. Behavior, judgment, thought content normal.   Assessment & Plan:  1. Reactive airway disease without complication, unspecified asthma severity, unspecified whether persistent -Recommended adding steriodal inhaler such as Flovent. Patient declined and would like to watch and wait to see if symptoms reoccur.  Recommended follow-up with PCP for pulmonology or Asthma and Allergy referral if symptoms reoccur.    DG Chest 2 View  Result Date: 11/07/2019 CLINICAL DATA:  Cough and congestion for 6 months. EXAM: CHEST - 2 VIEW COMPARISON:  PA and lateral chest 05/10/2004. FINDINGS: The lungs are clear. Heart size is normal. No pneumothorax or pleural fluid. No acute or focal bony abnormality IMPRESSION: Negative chest. Electronically Signed   By: Inge Rise M.D.   On: 11/07/2019 12:40    -The patient was given clear instructions to go to ER or return to medical center if symptoms do not improve, worsen or new problems develop. The patient verbalized understanding.     Molli Barrows, FNP-C [facility], PRN Provider  [facility]. [place], [place] [facility] Phone: [phone] Clinic Fax: [phone] Clinic Hours: 5:30 pm -7:30 pm (Monday-Friday)

## 2019-11-13 NOTE — Patient Instructions (Signed)
Shortness of Breath, Adult Shortness of breath means you have trouble breathing. Shortness of breath could be a sign of a medical problem. Follow these instructions at home:   Watch for any changes in your symptoms.  Do not use any products that contain nicotine or tobacco, such as cigarettes, e-cigarettes, and chewing tobacco.  Do not smoke. Smoking can cause shortness of breath. If you need help to quit smoking, ask your doctor.  Avoid things that can make it harder to breathe, such as: ? Mold. ? Dust. ? Air pollution. ? Chemical smells. ? Things that can cause allergy symptoms (allergens), if you have allergies.  Keep your living space clean. Use products that help remove mold and dust.  Rest as needed. Slowly return to your normal activities.  Take over-the-counter and prescription medicines only as told by your doctor. This includes oxygen therapy and inhaled medicines.  Keep all follow-up visits as told by your doctor. This is important. Contact a doctor if:  Your condition does not get better as soon as expected.  You have a hard time doing your normal activities, even after you rest.  You have new symptoms. Get help right away if:  Your shortness of breath gets worse.  You have trouble breathing when you are resting.  You feel light-headed or you pass out (faint).  You have a cough that is not helped by medicines.  You cough up blood.  You have pain with breathing.  You have pain in your chest, arms, shoulders, or belly (abdomen).  You have a fever.  You cannot walk up stairs.  You cannot exercise the way you normally do. These symptoms may represent a serious problem that is an emergency. Do not wait to see if the symptoms will go away. Get medical help right away. Call your local emergency services (911 in the [place] Do not drive yourself to the hospital. Summary  Shortness of breath is when you have trouble breathing enough air. It can be a  sign of a medical problem.  Avoid things that make it hard for you to breathe, such as smoking, pollution, mold, and dust.  Watch for any changes in your symptoms. Contact your doctor if you do not get better or you get worse. This information is not intended to replace advice given to you by your health care provider. Make sure you discuss any questions you have with your health care provider. Document Revised: 02/19/2018 Document Reviewed: 02/19/2018 Elsevier Patient Education  [place].        Bronchospasm, Adult  Bronchospasm is when airways in the lungs get smaller. When this happens, it can be hard to breathe. You may cough. You may also make a whistling sound when you breathe (wheeze). Follow these instructions at home: Medicines  Take over-the-counter and prescription medicines only as told by your doctor.  If you need to use an inhaler or nebulizer to take your medicine, ask your doctor how to use it.  If you were given a spacer, always use it with your inhaler. Lifestyle  Change your heating and air conditioning filter. Do this at least once a month.  Try not to use fireplaces and wood stoves.  Do not  smoke. Do not  allow smoking in your home.  Try not to use things that have a strong smell, like perfume.  Get rid of pests (such as roaches and mice) and their poop.  Remove any mold from your home.  Keep your house clean. Get rid of dust.  Use cleaning products that have no smell.  Replace carpet with wood, tile, or vinyl flooring.  Use allergy-proof pillows, mattress covers, and box spring covers.  Wash bed sheets and blankets every week. Use hot water. Dry them in a dryer.  Use blankets that are made of polyester or cotton.  Wash your hands often.  Keep pets out of your bedroom.  When you exercise, try not to breathe in cold air. General instructions  Have a plan for getting medical care. Know these things: ? When to call your doctor.  ? When to call local emergency services (911 in the [place] ? Where to go in an emergency.  Stay up to date on your shots (immunizations).  When you have an episode: ? Stay calm. ? Relax. ? Breathe slowly. Contact a doctor if:  Your muscles ache.  Your chest hurts.  The color of the mucus you cough up (sputum) changes from clear or white to yellow, green, gray, or bloody.  The mucus you cough up gets thicker.  You have a fever. Get help right away if:  The whistling sound gets worse, even after you take your medicines.  Your coughing gets worse.  You find it even harder to breathe.  Your chest hurts very much. Summary  Bronchospasm is when airways in the lungs get smaller.  When this happens, it can be hard to breathe. You may cough. You may also make a whistling sound when you breathe.  Stay away from things that cause you to have episodes. These include smoke or dust. This information is not intended to replace advice given to you by your health care provider. Make sure you discuss any questions you have with your health care provider. Document Revised: 09/01/2017 Document Reviewed: 09/22/2016 Elsevier Patient Education  2020 [employer].

## 2019-11-14 ENCOUNTER — Ambulatory Visit: Payer: Medicare HMO | Attending: Internal Medicine

## 2019-11-14 DIAGNOSIS — Z23 Encounter for immunization: Secondary | ICD-10-CM | POA: Insufficient documentation

## 2019-11-14 NOTE — Progress Notes (Signed)
   Covid-19 Vaccination Clinic  Name:  Laura Hopkins    MRN: [id number] DOB: 30-Jan-1946  11/14/2019  [name] was observed post Covid-19 immunization for 15 minutes without incidence. She was provided with Vaccine Information Sheet and instruction to access the V-Safe system.   [name] was instructed to call 911 with any severe reactions post vaccine: Marland Kitchen Difficulty breathing  . Swelling of your face and throat  . A fast heartbeat  . A bad rash all over your body  . Dizziness and weakness    Immunizations Administered    Name Date Dose VIS Date Route   Pfizer COVID-19 Vaccine 11/14/2019 11:43 AM 0.3 mL 09/13/2019 Intramuscular   Manufacturer: [place]   Lot: [id number]   [place]: [id number]

## 2019-11-25 DIAGNOSIS — Z01419 Encounter for gynecological examination (general) (routine) without abnormal findings: Secondary | ICD-10-CM | POA: Diagnosis not present

## 2019-11-25 DIAGNOSIS — Z6826 Body mass index (BMI) 26.0-26.9, adult: Secondary | ICD-10-CM | POA: Diagnosis not present

## 2019-12-09 ENCOUNTER — Telehealth: Payer: Self-pay

## 2019-12-09 NOTE — Telephone Encounter (Signed)
PA initiated via Covermymeds; KEY: [id number]. Awaiting determination.

## 2019-12-09 NOTE — Telephone Encounter (Signed)
PA approved. Effective 10/04/19 to 10/02/2020.  

## 2019-12-11 DIAGNOSIS — H524 Presbyopia: Secondary | ICD-10-CM | POA: Diagnosis not present

## 2019-12-12 ENCOUNTER — Ambulatory Visit (INDEPENDENT_AMBULATORY_CARE_PROVIDER_SITE_OTHER): Payer: Medicare HMO | Admitting: Family Medicine

## 2019-12-12 ENCOUNTER — Encounter: Payer: Self-pay | Admitting: Family Medicine

## 2019-12-12 ENCOUNTER — Other Ambulatory Visit: Payer: Self-pay

## 2019-12-12 VITALS — BP 126/88 | HR 89 | Ht 64.0 in | Wt 150.4 lb

## 2019-12-12 DIAGNOSIS — R059 Cough, unspecified: Secondary | ICD-10-CM

## 2019-12-12 DIAGNOSIS — G4700 Insomnia, unspecified: Secondary | ICD-10-CM | POA: Diagnosis not present

## 2019-12-12 DIAGNOSIS — J4541 Moderate persistent asthma with (acute) exacerbation: Secondary | ICD-10-CM | POA: Diagnosis not present

## 2019-12-12 DIAGNOSIS — R05 Cough: Secondary | ICD-10-CM

## 2019-12-12 MED ORDER — TEMAZEPAM 30 MG PO CAPS: 30.0000 mg | ORAL_CAPSULE | Freq: Every evening | ORAL | 2 refills | Status: DC | PRN

## 2019-12-12 MED ORDER — FLOVENT HFA 110 MCG/ACT IN AERO: 2.0000 | INHALATION_SPRAY | Freq: Two times a day (BID) | RESPIRATORY_TRACT | 6 refills | Status: DC | PRN

## 2019-12-12 NOTE — Progress Notes (Signed)
Virtual Visit via Video Note  I connected with Laura Hopkins on 12/12/19 at 11:40 AM EST by a video enabled telemedicine application and verified that I am speaking with the correct person using two identifiers.  Location: Patient: home  Provider: office         I discussed the limitations of evaluation and management by telemedicine and the availability of in person appointments. The patient expressed understanding and agreed to proceed.  History of Present Illness: Pt is c/o cough and wheeze again    She did get better with pred and abx but it then it came right back   She also has some nasal congestion and yellow mucus   Observations/Objective: Vitals:   12/12/19 1125  BP: 126/88  Pulse: 89   Pt is in nad Pt is not sob Assessment and Plan: 1. Moderate persistent asthma with acute exacerbation Pt never took flovent Will resend it in and use albuterol prn only Again discussed difference between rescue inhaler and maintenance  Pt requesting referral to pulmonary since this has been going on since May  - fluticasone (FLOVENT HFA) 110 MCG/ACT inhaler; Inhale 2 puffs into the lungs 2 (two) times daily as needed.  Dispense: 1 Inhaler; Refill: 6 - Ambulatory referral to Pulmonology  2. Insomnia, unspecified type This has been going on for years and getting worse  Pt is now ready for sleep evaluation  - Ambulatory referral to Neurology - temazepam (RESTORIL) 30 MG capsule; Take 1 capsule (30 mg total) by mouth at bedtime as needed for sleep.  Dispense: 30 capsule; Refill: 2  3. Cough    Follow Up Instructions:    I discussed the assessment and treatment plan with the patient. The patient was provided an opportunity to ask questions and all were answered. The patient agreed with the plan and demonstrated an understanding of the instructions.   The patient was advised to call back or seek an in-person evaluation if the symptoms worsen or if the condition fails to improve as  anticipated.  I provided 30 minutes of non-face-to-face time during this encounter.   Ann Held, DO

## 2019-12-17 ENCOUNTER — Encounter: Payer: Self-pay | Admitting: Family Medicine

## 2019-12-18 DIAGNOSIS — H04123 Dry eye syndrome of bilateral lacrimal glands: Secondary | ICD-10-CM | POA: Diagnosis not present

## 2019-12-18 DIAGNOSIS — H43813 Vitreous degeneration, bilateral: Secondary | ICD-10-CM | POA: Diagnosis not present

## 2019-12-18 DIAGNOSIS — Z961 Presence of intraocular lens: Secondary | ICD-10-CM | POA: Diagnosis not present

## 2019-12-18 DIAGNOSIS — H40013 Open angle with borderline findings, low risk, bilateral: Secondary | ICD-10-CM | POA: Diagnosis not present

## 2019-12-18 DIAGNOSIS — H26493 Other secondary cataract, bilateral: Secondary | ICD-10-CM | POA: Diagnosis not present

## 2019-12-18 LAB — BASIC METABOLIC PANEL
BUN/Creatinine Ratio: 14 (ref 12–28)
BUN: 13 mg/dL (ref 8–27)
CO2: 24 mmol/L (ref 20–29)
Calcium: 10.3 mg/dL (ref 8.7–10.3)
Chloride: 97 mmol/L (ref 96–106)
Creatinine, Ser: 0.94 mg/dL (ref 0.57–1.00)
GFR calc Af Amer: 71 mL/min/{1.73_m2} (ref >59)
GFR calc non Af Amer: 62 mL/min/{1.73_m2} (ref >59)
Glucose: 88 mg/dL (ref 65–99)
Potassium: 4.3 mmol/L (ref 3.5–5.2)
Sodium: 139 mmol/L (ref 134–144)

## 2019-12-18 LAB — ALDOSTERONE + RENIN ACTIVITY W/ RATIO
ALDOS/RENIN RATIO: 36.7 — ABNORMAL HIGH (ref 0.0–30.0)
ALDOSTERONE: 11.9 ng/dL (ref 0.0–30.0)
Renin: 0.324 ng/mL/hr (ref 0.167–5.380)

## 2019-12-23 ENCOUNTER — Telehealth: Payer: Self-pay | Admitting: Family Medicine

## 2019-12-23 DIAGNOSIS — I10 Essential (primary) hypertension: Secondary | ICD-10-CM

## 2019-12-23 NOTE — Telephone Encounter (Signed)
Medication: spironolactone (ALDACTONE) 25 MG tablet [id number]    Has the patient contacted their pharmacy? Yes.   (If no, request that the patient contact the pharmacy for the refill.) (If yes, when and what did the pharmacy advise?)  Preferred Pharmacy (with phone number or street name): [id number] - [place], [place] - [address]  [place], [place] [address]  Phone:  [phone] Fax:  [phone]  DEA #:  [id number]  Agent: Please be advised that RX refills may take up to 3 business days. We ask that you follow-up with your pharmacy.

## 2019-12-24 ENCOUNTER — Ambulatory Visit: Payer: Medicare HMO | Admitting: Neurology

## 2019-12-24 ENCOUNTER — Other Ambulatory Visit: Payer: Self-pay

## 2019-12-24 ENCOUNTER — Encounter: Payer: Self-pay | Admitting: Neurology

## 2019-12-24 VITALS — BP 122/80 | HR 64 | Temp 97.7°F | Ht 64.0 in | Wt 157.0 lb

## 2019-12-24 DIAGNOSIS — R0602 Shortness of breath: Secondary | ICD-10-CM | POA: Diagnosis not present

## 2019-12-24 DIAGNOSIS — G43909 Migraine, unspecified, not intractable, without status migrainosus: Secondary | ICD-10-CM | POA: Diagnosis not present

## 2019-12-24 DIAGNOSIS — R059 Cough, unspecified: Secondary | ICD-10-CM

## 2019-12-24 DIAGNOSIS — K219 Gastro-esophageal reflux disease without esophagitis: Secondary | ICD-10-CM | POA: Diagnosis not present

## 2019-12-24 DIAGNOSIS — R062 Wheezing: Secondary | ICD-10-CM

## 2019-12-24 DIAGNOSIS — R05 Cough: Secondary | ICD-10-CM | POA: Diagnosis not present

## 2019-12-24 DIAGNOSIS — R69 Illness, unspecified: Secondary | ICD-10-CM | POA: Diagnosis not present

## 2019-12-24 DIAGNOSIS — R0683 Snoring: Secondary | ICD-10-CM | POA: Diagnosis not present

## 2019-12-24 DIAGNOSIS — F5104 Psychophysiologic insomnia: Secondary | ICD-10-CM

## 2019-12-24 MED ORDER — SPIRONOLACTONE 25 MG PO TABS: 25.0000 mg | ORAL_TABLET | Freq: Every day | ORAL | 3 refills | Status: DC

## 2019-12-24 MED ORDER — TRAZODONE HCL 50 MG PO TABS: 50.0000 mg | ORAL_TABLET | Freq: Every day | ORAL | 5 refills | Status: DC

## 2019-12-24 NOTE — Patient Instructions (Signed)
Insomnia Insomnia is a sleep disorder that makes it difficult to fall asleep or stay asleep. Insomnia can cause fatigue, low energy, difficulty concentrating, mood swings, and poor performance at work or school. There are three different ways to classify insomnia:  Difficulty falling asleep.  Difficulty staying asleep.  Waking up too early in the morning. Any type of insomnia can be long-term (chronic) or short-term (acute). Both are common. Short-term insomnia usually lasts for three months or less. Chronic insomnia occurs at least three times a week for longer than three months. What are the causes? Insomnia may be caused by another condition, situation, or substance, such as:  Anxiety.  Certain medicines.  Gastroesophageal reflux disease (GERD) or other gastrointestinal conditions.  Asthma or other breathing conditions.  Restless legs syndrome, sleep apnea, or other sleep disorders.  Chronic pain.  Menopause.  Stroke.  Abuse of alcohol, tobacco, or illegal drugs.  Mental health conditions, such as depression.  Caffeine.  Neurological disorders, such as Alzheimer's disease.  An overactive thyroid (hyperthyroidism). Sometimes, the cause of insomnia may not be known. What increases the risk? Risk factors for insomnia include:  Gender. Women are affected more often than men.  Age. Insomnia is more common as you get older.  Stress.  Lack of exercise.  Irregular work schedule or working night shifts.  Traveling between different time zones.  Certain medical and mental health conditions. What are the signs or symptoms? If you have insomnia, the main symptom is having trouble falling asleep or having trouble staying asleep. This may lead to other symptoms, such as:  Feeling fatigued or having low energy.  Feeling nervous about going to sleep.  Not feeling rested in the morning.  Having trouble concentrating.  Feeling irritable, anxious, or depressed. How  is this diagnosed? This condition may be diagnosed based on:  Your symptoms and medical history. Your health care provider may ask about: ? Your sleep habits. ? Any medical conditions you have. ? Your mental health.  A physical exam. How is this treated? Treatment for insomnia depends on the cause. Treatment may focus on treating an underlying condition that is causing insomnia. Treatment may also include:  Medicines to help you sleep.  Counseling or therapy.  Lifestyle adjustments to help you sleep better. Follow these instructions at home: Eating and drinking   Limit or avoid alcohol, caffeinated beverages, and cigarettes, especially close to bedtime. These can disrupt your sleep.  Do not eat a large meal or eat spicy foods right before bedtime. This can lead to digestive discomfort that can make it hard for you to sleep. Sleep habits   Keep a sleep diary to help you and your health care provider figure out what could be causing your insomnia. Write down: ? When you sleep. ? When you wake up during the night. ? How well you sleep. ? How rested you feel the next day. ? Any side effects of medicines you are taking. ? What you eat and drink.  Make your bedroom a dark, comfortable place where it is easy to fall asleep. ? Put up shades or blackout curtains to block light from outside. ? Use a white noise machine to block noise. ? Keep the temperature cool.  Limit screen use before bedtime. This includes: ? Watching TV. ? Using your smartphone, tablet, or computer.  Stick to a routine that includes going to bed and waking up at the same times every day and night. This can help you fall asleep faster. Consider   making a quiet activity, such as reading, part of your nighttime routine.  Try to avoid taking naps during the day so that you sleep better at night.  Get out of bed if you are still awake after 15 minutes of trying to sleep. Keep the lights down, but try reading or  doing a quiet activity. When you feel sleepy, go back to bed. General instructions  Take over-the-counter and prescription medicines only as told by your health care provider.  Exercise regularly, as told by your health care provider. Avoid exercise starting several hours before bedtime.  Use relaxation techniques to manage stress. Ask your health care provider to suggest some techniques that may work well for you. These may include: ? Breathing exercises. ? Routines to release muscle tension. ? Visualizing peaceful scenes.  Make sure that you drive carefully. Avoid driving if you feel very sleepy.  Keep all follow-up visits as told by your health care provider. This is important. Contact a health care provider if:  You are tired throughout the day.  You have trouble in your daily routine due to sleepiness.  You continue to have sleep problems, or your sleep problems get worse. Get help right away if:  You have serious thoughts about hurting yourself or someone else. If you ever feel like you may hurt yourself or others, or have thoughts about taking your own life, get help right away. You can go to your nearest emergency department or call:  Your local emergency services (911 in the [place]  A suicide crisis helpline, such as the National Suicide Prevention Lifeline at 1-800-273-8255. This is open 24 hours a day. Summary  Insomnia is a sleep disorder that makes it difficult to fall asleep or stay asleep.  Insomnia can be long-term (chronic) or short-term (acute).  Treatment for insomnia depends on the cause. Treatment may focus on treating an underlying condition that is causing insomnia.  Keep a sleep diary to help you and your health care provider figure out what could be causing your insomnia. This information is not intended to replace advice given to you by your health care provider. Make sure you discuss any questions you have with your health care provider. Document  Revised: 09/01/2017 Document Reviewed: 06/29/2017 [employer] Patient Education  2020 [employer] Trazodone tablets What is this medicine? TRAZODONE (TRAZ oh done) is used to treat depression. This medicine may be used for other purposes; ask your health care provider or pharmacist if you have questions. COMMON BRAND NAME(S): Desyrel What should I tell my health care provider before I take this medicine? They need to know if you have any of these conditions:  attempted suicide or thinking about it  bipolar disorder  bleeding problems  glaucoma  heart disease, or previous heart attack  irregular heart beat  kidney or liver disease  low levels of sodium in the blood  an unusual or allergic reaction to trazodone, other medicines, foods, dyes or preservatives  pregnant or trying to get pregnant  breast-feeding How should I use this medicine? Take this medicine by mouth with a glass of water. Follow the directions on the prescription label. Take this medicine shortly after a meal or a light snack. Take your medicine at regular intervals. Do not take your medicine more often than directed. Do not stop taking this medicine suddenly except upon the advice of your doctor. Stopping this medicine too quickly may cause serious side effects or your condition may worsen. A special MedGuide will be given   to you by the pharmacist with each prescription and refill. Be sure to read this information carefully each time. Talk to your pediatrician regarding the use of this medicine in children. Special care may be needed. Overdosage: If you think you have taken too much of this medicine contact a poison control center or emergency room at once. NOTE: This medicine is only for you. Do not share this medicine with others. What if I miss a dose? If you miss a dose, take it as soon as you can. If it is almost time for your next dose, take only that dose. Do not take double or extra doses. What may  interact with this medicine? Do not take this medicine with any of the following medications:  certain medicines for fungal infections like fluconazole, itraconazole, ketoconazole, posaconazole, voriconazole  cisapride  dronedarone  linezolid  MAOIs like Carbex, Eldepryl, Marplan, Nardil, and Parnate  mesoridazine  methylene blue (injected into a vein)  pimozide  saquinavir  thioridazine This medicine may also interact with the following medications:  alcohol  antiviral medicines for HIV or AIDS  aspirin and aspirin-like medicines  barbiturates like phenobarbital  certain medicines for blood pressure, heart disease, irregular heart beat  certain medicines for depression, anxiety, or psychotic disturbances  certain medicines for migraine headache like almotriptan, eletriptan, frovatriptan, naratriptan, rizatriptan, sumatriptan, zolmitriptan  certain medicines for seizures like carbamazepine and phenytoin  certain medicines for sleep  certain medicines that treat or prevent blood clots like dalteparin, enoxaparin, warfarin  digoxin  fentanyl  lithium  NSAIDS, medicines for pain and inflammation, like ibuprofen or naproxen  other medicines that prolong the QT interval (cause an abnormal heart rhythm) like dofetilide  rasagiline  supplements like St. John's wort, kava kava, valerian  tramadol  tryptophan This list may not describe all possible interactions. Give your health care provider a list of all the medicines, herbs, non-prescription drugs, or dietary supplements you use. Also tell them if you smoke, drink alcohol, or use illegal drugs. Some items may interact with your medicine. What should I watch for while using this medicine? Tell your doctor if your symptoms do not get better or if they get worse. Visit your doctor or health care professional for regular checks on your progress. Because it may take several weeks to see the full effects of this  medicine, it is important to continue your treatment as prescribed by your doctor. Patients and their families should watch out for new or worsening thoughts of suicide or depression. Also watch out for sudden changes in feelings such as feeling anxious, agitated, panicky, irritable, hostile, aggressive, impulsive, severely restless, overly excited and hyperactive, or not being able to sleep. If this happens, especially at the beginning of treatment or after a change in dose, call your health care professional. You may get drowsy or dizzy. Do not drive, use machinery, or do anything that needs mental alertness until you know how this medicine affects you. Do not stand or sit up quickly, especially if you are an older patient. This reduces the risk of dizzy or fainting spells. Alcohol may interfere with the effect of this medicine. Avoid alcoholic drinks. This medicine may cause dry eyes and blurred vision. If you wear contact lenses you may feel some discomfort. Lubricating drops may help. See your eye doctor if the problem does not go away or is severe. Your mouth may get dry. Chewing sugarless gum, sucking hard candy and drinking plenty of water may help. Contact your doctor   if the problem does not go away or is severe. What side effects may I notice from receiving this medicine? Side effects that you should report to your doctor or health care professional as soon as possible:  allergic reactions like skin rash, itching or hives, swelling of the face, lips, or tongue  elevated mood, decreased need for sleep, racing thoughts, impulsive behavior  confusion  fast, irregular heartbeat  feeling faint or lightheaded, falls  feeling agitated, angry, or irritable  loss of balance or coordination  painful or prolonged erections  restlessness, pacing, inability to keep still  suicidal thoughts or other mood changes  tremors  trouble sleeping  seizures  unusual bleeding or bruising Side  effects that usually do not require medical attention (report to your doctor or health care professional if they continue or are bothersome):  change in sex drive or performance  change in appetite or weight  constipation  headache  muscle aches or pains  nausea This list may not describe all possible side effects. Call your doctor for medical advice about side effects. You may report side effects to FDA at 1-800-FDA-1088. Where should I keep my medicine? Keep out of the reach of children. Store at room temperature between 15 and 30 degrees C (59 to 86 degrees F). Protect from light. Keep container tightly closed. Throw away any unused medicine after the expiration date. NOTE: This sheet is a summary. It may not cover all possible information. If you have questions about this medicine, talk to your doctor, pharmacist, or health care provider.  2020 [employer]vier/[employer] Standard (2018-09-11 11:46:46)  

## 2019-12-24 NOTE — Telephone Encounter (Signed)
Refill sent.
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
## 2019-12-25 ENCOUNTER — Encounter: Payer: Self-pay | Admitting: Neurology

## 2019-12-25 ENCOUNTER — Other Ambulatory Visit: Payer: Self-pay

## 2019-12-26 ENCOUNTER — Encounter: Payer: Self-pay | Admitting: Family Medicine

## 2019-12-26 ENCOUNTER — Ambulatory Visit (INDEPENDENT_AMBULATORY_CARE_PROVIDER_SITE_OTHER): Payer: Medicare HMO | Admitting: Family Medicine

## 2019-12-26 VITALS — BP 120/88 | HR 54 | Temp 97.2°F | Resp 18 | Ht 64.0 in | Wt 158.2 lb

## 2019-12-26 DIAGNOSIS — R059 Cough, unspecified: Secondary | ICD-10-CM

## 2019-12-26 DIAGNOSIS — E785 Hyperlipidemia, unspecified: Secondary | ICD-10-CM | POA: Insufficient documentation

## 2019-12-26 DIAGNOSIS — R5383 Other fatigue: Secondary | ICD-10-CM | POA: Diagnosis not present

## 2019-12-26 DIAGNOSIS — I10 Essential (primary) hypertension: Secondary | ICD-10-CM

## 2019-12-26 DIAGNOSIS — Z8619 Personal history of other infectious and parasitic diseases: Secondary | ICD-10-CM | POA: Diagnosis not present

## 2019-12-26 DIAGNOSIS — R0683 Snoring: Secondary | ICD-10-CM

## 2019-12-26 DIAGNOSIS — R05 Cough: Secondary | ICD-10-CM | POA: Diagnosis not present

## 2019-12-26 LAB — VITAMIN B12: Vitamin B-12: 275 pg/mL (ref 211–911)

## 2019-12-26 LAB — COMPREHENSIVE METABOLIC PANEL
ALT: 7 U/L (ref 0–35)
AST: 19 U/L (ref 0–37)
Albumin: 4.3 g/dL (ref 3.5–5.2)
Alkaline Phosphatase: 57 U/L (ref 39–117)
BUN: 16 mg/dL (ref 6–23)
CO2: 29 mEq/L (ref 19–32)
Calcium: 10.1 mg/dL (ref 8.4–10.5)
Chloride: 101 mEq/L (ref 96–112)
Creatinine, Ser: 1.01 mg/dL (ref 0.40–1.20)
GFR: 53.99 mL/min — ABNORMAL LOW (ref >60.00)
Glucose, Bld: 75 mg/dL (ref 70–99)
Potassium: 5.2 mEq/L — ABNORMAL HIGH (ref 3.5–5.1)
Sodium: 136 mEq/L (ref 135–145)
Total Bilirubin: 0.5 mg/dL (ref 0.2–1.2)
Total Protein: 6.6 g/dL (ref 6.0–8.3)

## 2019-12-26 LAB — VITAMIN D 25 HYDROXY (VIT D DEFICIENCY, FRACTURES): VITD: 52.94 ng/mL (ref 30.00–100.00)

## 2019-12-26 LAB — LIPID PANEL
Cholesterol: 180 mg/dL (ref 0–200)
HDL: 58.8 mg/dL (ref >39.00)
LDL Cholesterol: 94 mg/dL (ref 0–99)
NonHDL: 121.34
Total CHOL/HDL Ratio: 3
Triglycerides: 139 mg/dL (ref 0.0–149.0)
VLDL: 27.8 mg/dL (ref 0.0–40.0)

## 2019-12-26 MED ORDER — VALACYCLOVIR HCL 1 G PO TABS: 1000.0000 mg | ORAL_TABLET | Freq: Three times a day (TID) | ORAL | 1 refills | Status: DC

## 2019-12-26 MED ORDER — SPIRONOLACTONE 25 MG PO TABS: 25.0000 mg | ORAL_TABLET | Freq: Every day | ORAL | 3 refills | Status: DC

## 2019-12-26 NOTE — Assessment & Plan Note (Signed)
F/u neuro  

## 2019-12-26 NOTE — Patient Instructions (Signed)

## 2019-12-26 NOTE — Assessment & Plan Note (Signed)
Tolerating statin, encouraged heart healthy diet, avoid trans fats, minimize simple carbs and saturated fats. Increase exercise as tolerated 

## 2019-12-26 NOTE — Progress Notes (Signed)
Patient ID: Laura Hopkins, female    DOB: [birth year]  Age: 74 y.o. MRN: [id number]    Subjective:  Subjective  HPI Laura Hopkins presents for f/u -- cough has resolved.  She has seen neuro for sleep and sleep study being set up.    Review of Systems  Constitutional: Negative for appetite change, diaphoresis, fatigue and unexpected weight change.  Eyes: Negative for pain, redness and visual disturbance.  Respiratory: Negative for cough, chest tightness, shortness of breath and wheezing.   Cardiovascular: Negative for chest pain, palpitations and leg swelling.  Endocrine: Negative for cold intolerance, heat intolerance, polydipsia, polyphagia and polyuria.  Genitourinary: Negative for difficulty urinating, dysuria and frequency.  Neurological: Negative for dizziness, light-headedness, numbness and headaches.    History Past Medical History:  Diagnosis Date  . Acute pharyngitis 02/26/2009   Qualifier: Diagnosis of  By: Jerold Coombe    . Arthritis    hands and neck   . Essential hypertension 02/07/2007   Qualifier: Diagnosis of  By: Jerold Coombe    . Headache(784.0)   . Insomnia 12/23/2017  . Lower extremity edema 07/31/2017  . Migraine 05/23/2017  . SINUSITIS - ACUTE-NOS 03/17/2009   Qualifier: Diagnosis of  By: Jerold Coombe    . Skin cancer    basal cell  . 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 Amy Martinique   . URI 02/07/2007   Qualifier: Diagnosis of  By: Jerold Coombe      She has a past surgical history that includes Abdominal hysterectomy (1995); Shoulder surgery (2000); Nasal septum surgery (1983); Colonoscopy (2007); Wisdom tooth extraction; Tubal ligation (1978); and Eye surgery (Bilateral, 03/03/2017).   Her family history includes Arthritis in her father; Dementia in her mother; Diabetes in her father; Hypertension in her father and  mother; Prostate cancer in her father; Skin cancer in her brother.She reports that she quit smoking about 20 years ago. Her smoking use included cigarettes. She smoked 0.30 packs per day. She has never used smokeless tobacco. She reports that she does not drink alcohol or use drugs.  Current Outpatient Medications on File Prior to Visit  Medication Sig Dispense Refill  . acetaminophen (TYLENOL) 500 MG tablet Take 500 mg by mouth every 6 (six) hours as needed.    Marland Kitchen albuterol (VENTOLIN HFA) 108 (90 Base) MCG/ACT inhaler Inhale 2 puffs into the lungs every 6 (six) hours as needed for wheezing or shortness of breath. 18 g 2  . ALPRAZolam (XANAX) 0.25 MG tablet Take 1 tablet by mouth 2 (two) times daily as needed.  1  . Apoaequorin (PREVAGEN PO) Take 1 tablet by mouth daily.    . bimatoprost (LATISSE) 0.03 % ophthalmic solution Place 1 drop into both eyes 2 (two) times a day.    . calcium carbonate (OS-CAL) 600 MG TABS tablet Take 1 tablet by mouth 2 (two) times daily with a meal.    . Erenumab-aooe (AIMOVIG) 70 MG/ML SOAJ Inject 70 mg into the skin every 30 (thirty) days. 3 pen 1  . estradiol (VIVELLE-DOT) 0.05 MG/24HR Place 1 patch onto the skin once a week.      . Flaxseed, Linseed, (FLAX SEED OIL) 1000 MG CAPS Take 1 capsule by mouth daily.    . fluticasone (FLOVENT HFA) 110 MCG/ACT inhaler Inhale 2 puffs into the lungs 2 (two) times daily  as needed. 1 Inhaler 6  . Glucosamine-Chondroitin (OSTEO BI-FLEX REGULAR STRENGTH PO) Take 1 tablet by mouth daily.     . naproxen sodium (ALEVE) 220 MG tablet Take 220 mg by mouth daily as needed.    . Omega-3 Fatty Acids (FISH OIL) 1000 MG CAPS Take 1 capsule by mouth daily.     . SUMAtriptan (IMITREX) 100 MG tablet TAKE 1 TABLET (100 MG TOTAL) BY MOUTH AS NEEDED.MAX ON INSURANCE 10 tablet 3  . temazepam (RESTORIL) 30 MG capsule Take 1 capsule (30 mg total) by mouth at bedtime as needed for sleep. 30 capsule 2  . traZODone (DESYREL) 50 MG tablet Take 1 tablet  (50 mg total) by mouth at bedtime. 30 tablet 5  . VITAMIN D PO Take 1,000 mg by mouth daily.     No current facility-administered medications on file prior to visit.     Objective:  Objective  Physical Exam Vitals and nursing note reviewed.  Constitutional:      Appearance: She is well-developed.  HENT:     Head: Normocephalic and atraumatic.  Eyes:     Conjunctiva/sclera: Conjunctivae normal.  Neck:     Thyroid: No thyromegaly.     Vascular: No carotid bruit or JVD.  Cardiovascular:     Rate and Rhythm: Normal rate and regular rhythm.     Heart sounds: Normal heart sounds. No murmur.  Pulmonary:     Effort: Pulmonary effort is normal. No respiratory distress.     Breath sounds: Normal breath sounds. No wheezing or rales.  Chest:     Chest wall: No tenderness.  Musculoskeletal:     Cervical back: Normal range of motion and neck supple.  Neurological:     Mental Status: She is alert and oriented to person, place, and time.    BP 120/88 (BP Location: Left Arm, Patient Position: Sitting, Cuff Size: Normal)   Pulse (!) 54   Temp (!) 97.2 F (36.2 C) (Temporal)   Resp 18   Ht 5\' 4"  (1.626 m)   Wt 158 lb 3.2 oz (71.8 kg)   SpO2 99%   BMI 27.15 kg/m  Wt Readings from Last 3 Encounters:  12/26/19 158 lb 3.2 oz (71.8 kg)  12/24/19 157 lb (71.2 kg)  12/12/19 150 lb 6.4 oz (68.2 kg)     Lab Results  Component Value Date   WBC 8.4 11/06/2019   HGB 15.0 11/06/2019   HCT 45.4 11/06/2019   PLT 298 11/06/2019   GLUCOSE 91 11/06/2019   CHOL 177 05/31/2018   TRIG 107.0 05/31/2018   HDL 59.40 05/31/2018   LDLCALC 96 05/31/2018   ALT 8 11/06/2019   AST 21 11/06/2019   NA 141 11/06/2019   K 5.2 11/06/2019   CL 103 11/06/2019   CREATININE 0.85 11/06/2019   BUN 12 11/06/2019   CO2 25 11/06/2019   TSH 1.21 03/07/2011    DG Chest 2 View  Result Date: 11/07/2019 CLINICAL DATA:  Cough and congestion for 6 months. EXAM: CHEST - 2 VIEW COMPARISON:  PA and lateral chest  05/10/2004. FINDINGS: The lungs are clear. Heart size is normal. No pneumothorax or pleural fluid. No acute or focal bony abnormality IMPRESSION: Negative chest. Electronically Signed   By: Inge Rise M.D.   On: 11/07/2019 12:40     Assessment & Plan:  Plan  I am having [name] start on valACYclovir. I am also having her maintain her estradiol, Glucosamine-Chondroitin (OSTEO BI-FLEX REGULAR STRENGTH PO), ALPRAZolam, Fish  Oil, Flax Seed Oil, VITAMIN D PO, calcium carbonate, bimatoprost, Apoaequorin (PREVAGEN PO), acetaminophen, naproxen sodium, SUMAtriptan, Aimovig, albuterol, Flovent HFA, temazepam, traZODone, and spironolactone.  Meds ordered this encounter  Medications  . spironolactone (ALDACTONE) 25 MG tablet    Sig: Take 1 tablet (25 mg total) by mouth daily.    Dispense:  90 tablet    Refill:  3  . valACYclovir (VALTREX) 1000 MG tablet    Sig: Take 1 tablet (1,000 mg total) by mouth 3 (three) times daily.    Dispense:  30 tablet    Refill:  1    Problem List Items Addressed This Visit      Unprioritized   Essential hypertension    Well controlled, no changes to meds. Encouraged heart healthy diet such as the DASH diet and exercise as tolerated.       Relevant Medications   spironolactone (ALDACTONE) 25 MG tablet   Other Relevant Orders   Lipid panel   Comprehensive metabolic panel   Hyperlipidemia    Tolerating statin, encouraged heart healthy diet, avoid trans fats, minimize simple carbs and saturated fats. Increase exercise as tolerated      Relevant Medications   spironolactone (ALDACTONE) 25 MG tablet   Other Relevant Orders   Lipid panel   Comprehensive metabolic panel   Snoring    F/u neuro        Other Visit Diagnoses    H/O cold sores    -  Primary   Relevant Medications   valACYclovir (VALTREX) 1000 MG tablet   Cough       Other fatigue       Relevant Orders   Vitamin D (25 hydroxy)   Vitamin B12      Follow-up: Return in about 6  months (around 06/27/2020) for annual exam, fasting.  Ann Held, DO

## 2019-12-26 NOTE — Assessment & Plan Note (Signed)
Well controlled, no changes to meds. Encouraged heart healthy diet such as the DASH diet and exercise as tolerated.  °

## 2019-12-30 ENCOUNTER — Telehealth: Payer: Self-pay

## 2019-12-30 DIAGNOSIS — I10 Essential (primary) hypertension: Secondary | ICD-10-CM

## 2019-12-30 MED ORDER — SPIRONOLACTONE 25 MG PO TABS: 25.0000 mg | ORAL_TABLET | Freq: Every day | ORAL | 3 refills | Status: DC

## 2019-12-30 NOTE — Telephone Encounter (Signed)
Refill sent.

## 2019-12-30 NOTE — Telephone Encounter (Signed)
Patient called in to see if Dr. Etter Sjogren could send in a prescription for  spironolactone (ALDACTONE) 25 MG tablet [id number]    Please send it to [id number] - [place], [place]  [place], [place] [address]  Phone:  [phone] Fax:  [phone]  DEA #:  [id number]

## 2020-01-06 DIAGNOSIS — R69 Illness, unspecified: Secondary | ICD-10-CM | POA: Diagnosis not present

## 2020-01-06 DIAGNOSIS — Z1231 Encounter for screening mammogram for malignant neoplasm of breast: Secondary | ICD-10-CM | POA: Diagnosis not present

## 2020-01-15 ENCOUNTER — Ambulatory Visit (INDEPENDENT_AMBULATORY_CARE_PROVIDER_SITE_OTHER): Payer: Medicare HMO | Admitting: Neurology

## 2020-01-15 DIAGNOSIS — G4733 Obstructive sleep apnea (adult) (pediatric): Secondary | ICD-10-CM

## 2020-01-15 DIAGNOSIS — R0602 Shortness of breath: Secondary | ICD-10-CM

## 2020-01-15 DIAGNOSIS — R059 Cough, unspecified: Secondary | ICD-10-CM

## 2020-01-15 DIAGNOSIS — F5104 Psychophysiologic insomnia: Secondary | ICD-10-CM

## 2020-01-15 DIAGNOSIS — G43909 Migraine, unspecified, not intractable, without status migrainosus: Secondary | ICD-10-CM

## 2020-01-15 DIAGNOSIS — R05 Cough: Secondary | ICD-10-CM

## 2020-01-15 DIAGNOSIS — R0689 Other abnormalities of breathing: Secondary | ICD-10-CM

## 2020-01-20 ENCOUNTER — Encounter: Payer: Self-pay | Admitting: Neurology

## 2020-01-26 DIAGNOSIS — R0689 Other abnormalities of breathing: Secondary | ICD-10-CM | POA: Insufficient documentation

## 2020-01-26 NOTE — Progress Notes (Signed)
Summary & Diagnosis:   The patient has severe sleep apnea at AHI of 40.4/h and REM AHI  of 65.8/h. I have doubts about the RDI - indicating almost no  snoring. 35 minutes of sleep hypoxia were recorded.  There was also a worse AHI on the left sleep position than  supine, and this is raising suspicion about the positional  electrode not being placed correctly. The patient was recorded  for over 7 hours total.   Recommendations:    The essential data confirm the presence of severe OSA with strong  REM sleep accentuation, rendering treatment with anything but  positive airway pressure ineffective. PAP can worsen coughing but  can also help when humidified and applied through a nasal pillow  or nasal mask.  My goal would be an attended sleep study titration if possible  and ordered the study.  My Plan B would be autotitration with a nasal interface and 3 cm  EPR, suggesting a pressure window of 5-18 cm water.  Interpreting Physician: Larey Seat, MD

## 2020-01-26 NOTE — Addendum Note (Signed)
Addended by: Larey Seat on: 01/26/2020 02:00 PM   Modules accepted: Orders

## 2020-01-26 NOTE — Procedures (Signed)
Patient Information     First Name: Laura Last Name: Hopkins Pinney: [id number]  Birth Date: Sep 17, 1946 Age: 74 Gender: Female  Referring Provider: Garnet Koyanagi- Chase, DO BMI: 26.7 (W=156 lb, H=5' 4'')  Neck Circ.:  14 '' Epworth:  3/24   Sleep Study Information    Study Date: Jan 15, 2020 S/H/A Version: 001.001.001.001 / 4.1.1528 / 53  History:    Laura Hopkins is a 74 year- old Caucasian female patient, presenting for consultation on 12-24-2019. She states she has chronic difficulty with going to sleep and staying asleep. She wakes up frequently during the night. She is not endorsing daytime sleepiness but severe fatigue (53/63 points) and may be depressed.  The patient has a past medical history of Essential hypertension (02/07/2007), Headache (784.0), Insomnia (12/23/2017), Lower extremity edema (07/31/2017), Migraine (05/23/2017), SINUSITIS - (03/17/2009), and URI (02/07/2007). Migraines were triggered by alcohol. Recently has developed Reflux, GERD, now improved after she cut out caffeine. Possible asthma, coughing day and night for the last 10 months. HTN, Prilosec for GERD.  Sleep relevant medical history: sleep initiation Insomnia- Insomnia has been present for years. Often having trouble going back to sleep after a 3 AM coughing or choking spell .She reports not feeling refreshed or restored in AM, waking with symptoms such as dry mouth, morning headaches (migrainous), and residual fatigue. Naps are taken rather infrequently. Estimated night time sleep less than 5.5 hours.     Summary & Diagnosis:    The patient has severe sleep apnea at AHI of 40.4/h and REM AHI of 65.8/h. I have doubts about the RDI - indicating almost no snoring.  35 minutes of sleep hypoxia were recorded.  There was also a worse AHI on the left sleep position than supine, and this is raising suspicion about the positional electrode not being placed correctly. The patient was recorded for over 7 hours total.   Recommendations:     The  essential data confirm the presence of severe OSA with strong REM sleep accentuation, rendering treatment with anything but positive airway pressure ineffective. PAP can worsen coughing but can also help when humidified and applied through a nasal pillow or nasal mask.  My goal would be an attended sleep study titration if possible and ordered the study.  My Plan B would be autotitration with a nasal interface and 3 cm EPR, suggesting a pressure window of 5-18 cm water.  Interpreting Physician: Merchant navy officer, M<D            Sleep Summary  Oxygen Saturation Statistics   Start Study Time: End Study Time: Total Recording Time:  11:26:07 PM 7:53:00 AM 8 h, 26 min  Total Sleep Time % REM of Sleep Time:  7 h, 27 min  27.9    Mean: 91 Minimum: 82 Maximum: 98  Mean of Desaturations Nadirs (%):   90  Oxygen Desaturation. %:   4-9 10-20 >20 Total  Events Number Total    68  1 98.6 1.4  0 0.0  69 100.0  Oxygen Saturation: <90 <=88 <85 <80 <70  Duration (minutes): Sleep % 34.7 7.8  4.0 0.1  0.9 0.0 0.0 0.0 0.0 0.0     Respiratory Indices      Total Events REM NREM All Night  pRDI:  300  pAHI:  294 ODI:  69 pAHIc:  15  % CSR: 0.0 66.8 65.8 21.8 5.7 31.9 31.2 5.0 0.8 41.2 40.4 9.5 2.1       Pulse Rate Statistics  during Sleep (BPM)      Mean: 58 Minimum: 41 Maximum: 93    Indices are calculated using technically valid sleep time of 7 h, 17 min. Central-Indices are calculated using technically valid sleep time of 7 h, 13 min.Marland Kitchen pRDI/pAHI are calculated using O2 desaturations ? 3% Sit Body Position Statistics  Position Supine Prone Right Left Non-Supine  Sleep (min) 192.4 0.0 134.5 121.0 255.5  Sleep % 43.0 0.0 30.0 27.0 57.0  pRDI 38.9 N/A 32.4 53.9 42.9  pAHI 38.3 N/A 31.5 52.9 42.0  ODI 6.9 N/A 6.7 16.5 11.5     Snoring Statistics Snoring Level (dB) >40 >50 >60 >70 >80 >Threshold (45)  Sleep (min) 5.9 2.0 0.6 0.0 0.0 2.7  Sleep % 1.3 0.4 0.1 0.0 0.0  0.6    Mean: 40 dB Sleep Stages Chart                                                             pAHI=40.4                                                                         Mild              Moderate                    Severe                                                 5              15                    30  * Reference values are according to AASM guidelines

## 2020-01-27 ENCOUNTER — Telehealth: Payer: Self-pay

## 2020-01-27 ENCOUNTER — Telehealth: Payer: Self-pay | Admitting: Neurology

## 2020-01-27 DIAGNOSIS — R0689 Other abnormalities of breathing: Secondary | ICD-10-CM

## 2020-01-27 DIAGNOSIS — G43909 Migraine, unspecified, not intractable, without status migrainosus: Secondary | ICD-10-CM

## 2020-01-27 DIAGNOSIS — G4733 Obstructive sleep apnea (adult) (pediatric): Secondary | ICD-10-CM

## 2020-01-27 DIAGNOSIS — R0602 Shortness of breath: Secondary | ICD-10-CM

## 2020-01-27 DIAGNOSIS — R059 Cough, unspecified: Secondary | ICD-10-CM

## 2020-01-27 DIAGNOSIS — R05 Cough: Secondary | ICD-10-CM

## 2020-01-27 NOTE — Telephone Encounter (Signed)
Per recommendation if in lab was denied, Dr Brett Fairy advising starting patient on auto CPAP 5-18 cm water pressure with 3 cm EPR. I will place order and discuss where to send when result is called.

## 2020-01-27 NOTE — Telephone Encounter (Signed)
I called pt. I advised pt that Dr. Brett Fairy reviewed their sleep study results and found that pt has severe sleep apnea. Dr. Brett Fairy recommends that pt starts auto CPAP. I reviewed PAP compliance expectations with the pt. Pt is agreeable to starting a CPAP. I advised pt that an order will be sent to a DME, [facility], and [facility] will call the pt within about one week after they file with the pt's insurance. [facility] will show the pt how to use the machine, fit for masks, and troubleshoot the CPAP if needed. A follow up appt was made for insurance purposes with Dr. Brett Fairy on June 21,2021 at 2:30 pm. Pt verbalized understanding to arrive 15 minutes early and bring their CPAP. A letter with all of this information in it will be mailed to the pt as a reminder. I verified with the pt that the address we have on file is correct. Pt verbalized understanding of results. Pt had no questions at this time but was encouraged to call back if questions arise. I have sent the order to [facility] and have received confirmation that they have received the order.

## 2020-01-27 NOTE — Telephone Encounter (Signed)
-----   Message from Larey Seat, MD sent at 01/26/2020  1:59 PM EDT ----- Summary & Diagnosis:   The patient has severe sleep apnea at AHI of 40.4/h and REM AHI  of 65.8/h. I have doubts about the RDI - indicating almost no  snoring. 35 minutes of sleep hypoxia were recorded.  There was also a worse AHI on the left sleep position than  supine, and this is raising suspicion about the positional  electrode not being placed correctly. The patient was recorded  for over 7 hours total.   Recommendations:    The essential data confirm the presence of severe OSA with strong  REM sleep accentuation, rendering treatment with anything but  positive airway pressure ineffective. PAP can worsen coughing but  can also help when humidified and applied through a nasal pillow  or nasal mask.  My goal would be an attended sleep study titration if possible  and ordered the study.  My Plan B would be autotitration with a nasal interface and 3 cm  EPR, suggesting a pressure window of 5-18 cm water.  Interpreting Physician: Larey Seat, MD

## 2020-01-27 NOTE — Telephone Encounter (Signed)
[facility] will not pay for cpap tiration due to not enough central apnea on HST. She will need an Auto cpap ordered.

## 2020-01-30 DIAGNOSIS — M5030 Other cervical disc degeneration, unspecified cervical region: Secondary | ICD-10-CM | POA: Diagnosis not present

## 2020-02-06 DIAGNOSIS — G4733 Obstructive sleep apnea (adult) (pediatric): Secondary | ICD-10-CM | POA: Diagnosis not present

## 2020-02-20 ENCOUNTER — Other Ambulatory Visit: Payer: Self-pay | Admitting: Physical Medicine and Rehabilitation

## 2020-02-20 DIAGNOSIS — M542 Cervicalgia: Secondary | ICD-10-CM

## 2020-02-26 ENCOUNTER — Other Ambulatory Visit: Payer: Self-pay | Admitting: Family Medicine

## 2020-02-26 ENCOUNTER — Telehealth: Payer: Self-pay

## 2020-02-26 DIAGNOSIS — G43509 Persistent migraine aura without cerebral infarction, not intractable, without status migrainosus: Secondary | ICD-10-CM

## 2020-02-26 DIAGNOSIS — R519 Headache, unspecified: Secondary | ICD-10-CM

## 2020-02-26 NOTE — Telephone Encounter (Signed)
PA approved. Effective 10/04/19 to 10/02/2020.  

## 2020-02-26 NOTE — Telephone Encounter (Signed)
PA initiated via Covermymeds; KEY: [id number]. Awaiting determination.

## 2020-03-08 DIAGNOSIS — G4733 Obstructive sleep apnea (adult) (pediatric): Secondary | ICD-10-CM | POA: Diagnosis not present

## 2020-03-13 DIAGNOSIS — G4733 Obstructive sleep apnea (adult) (pediatric): Secondary | ICD-10-CM | POA: Diagnosis not present

## 2020-03-18 ENCOUNTER — Encounter: Payer: Self-pay | Admitting: Neurology

## 2020-03-23 ENCOUNTER — Encounter: Payer: Self-pay | Admitting: Neurology

## 2020-03-23 ENCOUNTER — Ambulatory Visit: Payer: Medicare HMO | Admitting: Neurology

## 2020-03-23 VITALS — BP 134/89 | HR 61 | Ht 64.0 in | Wt 150.0 lb

## 2020-03-23 DIAGNOSIS — Z87898 Personal history of other specified conditions: Secondary | ICD-10-CM | POA: Insufficient documentation

## 2020-03-23 DIAGNOSIS — R059 Cough, unspecified: Secondary | ICD-10-CM

## 2020-03-23 DIAGNOSIS — G4733 Obstructive sleep apnea (adult) (pediatric): Secondary | ICD-10-CM | POA: Insufficient documentation

## 2020-03-23 DIAGNOSIS — R69 Illness, unspecified: Secondary | ICD-10-CM | POA: Diagnosis not present

## 2020-03-23 DIAGNOSIS — R05 Cough: Secondary | ICD-10-CM

## 2020-03-23 DIAGNOSIS — K219 Gastro-esophageal reflux disease without esophagitis: Secondary | ICD-10-CM | POA: Diagnosis not present

## 2020-03-23 DIAGNOSIS — Z9989 Dependence on other enabling machines and devices: Secondary | ICD-10-CM

## 2020-03-23 DIAGNOSIS — F320 Major depressive disorder, single episode, mild: Secondary | ICD-10-CM | POA: Insufficient documentation

## 2020-03-23 DIAGNOSIS — F5104 Psychophysiologic insomnia: Secondary | ICD-10-CM | POA: Diagnosis not present

## 2020-03-23 DIAGNOSIS — Z8709 Personal history of other diseases of the respiratory system: Secondary | ICD-10-CM

## 2020-03-23 MED ORDER — CLONAZEPAM 0.5 MG PO TABS: 0.5000 mg | ORAL_TABLET | Freq: Every day | ORAL | 3 refills | Status: DC
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
## 2020-03-23 NOTE — Patient Instructions (Signed)

## 2020-03-28 ENCOUNTER — Ambulatory Visit
Admission: RE | Admit: 2020-03-28 | Discharge: 2020-03-28 | Disposition: A | Discharge status: Home / Self Care | Payer: Medicare HMO | Source: Ambulatory Visit | Attending: Physical Medicine and Rehabilitation | Admitting: Physical Medicine and Rehabilitation

## 2020-03-28 ENCOUNTER — Other Ambulatory Visit: Payer: Self-pay

## 2020-03-28 DIAGNOSIS — M542 Cervicalgia: Secondary | ICD-10-CM

## 2020-03-28 DIAGNOSIS — M4802 Spinal stenosis, cervical region: Secondary | ICD-10-CM | POA: Diagnosis not present

## 2020-04-07 DIAGNOSIS — G4733 Obstructive sleep apnea (adult) (pediatric): Secondary | ICD-10-CM | POA: Diagnosis not present

## 2020-04-12 DIAGNOSIS — G4733 Obstructive sleep apnea (adult) (pediatric): Secondary | ICD-10-CM | POA: Diagnosis not present

## 2020-04-27 DIAGNOSIS — M5412 Radiculopathy, cervical region: Secondary | ICD-10-CM | POA: Diagnosis not present

## 2020-04-27 DIAGNOSIS — M542 Cervicalgia: Secondary | ICD-10-CM | POA: Diagnosis not present

## 2020-04-28 DIAGNOSIS — R69 Illness, unspecified: Secondary | ICD-10-CM | POA: Diagnosis not present

## 2020-05-05 ENCOUNTER — Other Ambulatory Visit: Payer: Self-pay | Admitting: Cardiology

## 2020-05-05 DIAGNOSIS — I10 Essential (primary) hypertension: Secondary | ICD-10-CM

## 2020-05-05 NOTE — Telephone Encounter (Signed)
Spironolactone refill denied , ordering provider is Lyndal Pulley, DO

## 2020-05-07 DIAGNOSIS — G4733 Obstructive sleep apnea (adult) (pediatric): Secondary | ICD-10-CM | POA: Diagnosis not present

## 2020-05-08 DIAGNOSIS — G4733 Obstructive sleep apnea (adult) (pediatric): Secondary | ICD-10-CM | POA: Diagnosis not present

## 2020-05-14 DIAGNOSIS — M5030 Other cervical disc degeneration, unspecified cervical region: Secondary | ICD-10-CM | POA: Diagnosis not present

## 2020-06-03 DIAGNOSIS — M47812 Spondylosis without myelopathy or radiculopathy, cervical region: Secondary | ICD-10-CM | POA: Diagnosis not present

## 2020-06-04 ENCOUNTER — Other Ambulatory Visit: Payer: Self-pay | Admitting: Family Medicine

## 2020-06-04 DIAGNOSIS — G43809 Other migraine, not intractable, without status migrainosus: Secondary | ICD-10-CM

## 2020-06-07 DIAGNOSIS — G4733 Obstructive sleep apnea (adult) (pediatric): Secondary | ICD-10-CM | POA: Diagnosis not present

## 2020-06-08 DIAGNOSIS — G4733 Obstructive sleep apnea (adult) (pediatric): Secondary | ICD-10-CM | POA: Diagnosis not present

## 2020-06-25 DIAGNOSIS — M47812 Spondylosis without myelopathy or radiculopathy, cervical region: Secondary | ICD-10-CM | POA: Diagnosis not present

## 2020-07-02 ENCOUNTER — Ambulatory Visit (INDEPENDENT_AMBULATORY_CARE_PROVIDER_SITE_OTHER): Payer: Medicare HMO | Admitting: Family Medicine

## 2020-07-02 ENCOUNTER — Encounter: Payer: Self-pay | Admitting: Family Medicine

## 2020-07-02 ENCOUNTER — Other Ambulatory Visit: Payer: Self-pay

## 2020-07-02 VITALS — BP 100/60 | HR 66 | Temp 98.9°F | Resp 18 | Ht 64.0 in | Wt 147.4 lb

## 2020-07-02 DIAGNOSIS — G4700 Insomnia, unspecified: Secondary | ICD-10-CM | POA: Diagnosis not present

## 2020-07-02 DIAGNOSIS — I10 Essential (primary) hypertension: Secondary | ICD-10-CM

## 2020-07-02 DIAGNOSIS — G43819 Other migraine, intractable, without status migrainosus: Secondary | ICD-10-CM | POA: Diagnosis not present

## 2020-07-02 DIAGNOSIS — S60419A Abrasion of unspecified finger, initial encounter: Secondary | ICD-10-CM

## 2020-07-02 DIAGNOSIS — Z23 Encounter for immunization: Secondary | ICD-10-CM

## 2020-07-02 DIAGNOSIS — Z0000 Encounter for general adult medical examination without abnormal findings: Secondary | ICD-10-CM

## 2020-07-02 MED ORDER — NONFORMULARY OR COMPOUNDED ITEM: 1 refills | Status: DC

## 2020-07-02 NOTE — Patient Instructions (Signed)
Preventive Care 72 Years and Older, Female Preventive care refers to lifestyle choices and visits with your health care provider that can promote health and wellness. This includes:  A yearly physical exam. This is also called an annual well check.  Regular dental and eye exams.  Immunizations.  Screening for certain conditions.  Healthy lifestyle choices, such as diet and exercise. What can I expect for my preventive care visit? Physical exam Your health care provider will check:  Height and weight. These may be used to calculate body mass index (BMI), which is a measurement that tells if you are at a healthy weight.  Heart rate and blood pressure.  Your skin for abnormal spots. Counseling Your health care provider may ask you questions about:  Alcohol, tobacco, and drug use.  Emotional well-being.  Home and relationship well-being.  Sexual activity.  Eating habits.  History of falls.  Memory and ability to understand (cognition).  Work and work Statistician.  Pregnancy and menstrual history. What immunizations do I need?  Influenza (flu) vaccine  This is recommended every year. Tetanus, diphtheria, and pertussis (Tdap) vaccine  You may need a Td booster every 10 years. Varicella (chickenpox) vaccine  You may need this vaccine if you have not already been vaccinated. Zoster (shingles) vaccine  You may need this after age 72. Pneumococcal conjugate (PCV13) vaccine  One dose is recommended after age 72. Pneumococcal polysaccharide (PPSV23) vaccine  One dose is recommended after age 72. Measles, mumps, and rubella (MMR) vaccine  You may need at least one dose of MMR if you were born in 1957 or later. You may also need a second dose. Meningococcal conjugate (MenACWY) vaccine  You may need this if you have certain conditions. Hepatitis A vaccine  You may need this if you have certain conditions or if you travel or work in places where you may be exposed  to hepatitis A. Hepatitis B vaccine  You may need this if you have certain conditions or if you travel or work in places where you may be exposed to hepatitis B. Haemophilus influenzae type b (Hib) vaccine  You may need this if you have certain conditions. You may receive vaccines as individual doses or as more than one vaccine together in one shot (combination vaccines). Talk with your health care provider about the risks and benefits of combination vaccines. What tests do I need? Blood tests  Lipid and cholesterol levels. These may be checked every 5 years, or more frequently depending on your overall health.  Hepatitis C test.  Hepatitis B test. Screening  Lung cancer screening. You may have this screening every year starting at age 72 if you have a 30-pack-year history of smoking and currently smoke or have quit within the past 15 years.  Colorectal cancer screening. All adults should have this screening starting at age 72 and continuing until age 15. Your health care provider may recommend screening at age 72 if you are at increased risk. You will have tests every 72-10 years, depending on your results and the type of screening test.  Diabetes screening. This is done by checking your blood sugar (glucose) after you have not eaten for a while (fasting). You may have this done every 1-3 years.  Mammogram. This may be done every 1-2 years. Talk with your health care provider about how often you should have regular mammograms.  BRCA-related cancer screening. This may be done if you have a family history of breast, ovarian, tubal, or peritoneal cancers.  Other tests  Sexually transmitted disease (STD) testing.  Bone density scan. This is done to screen for osteoporosis. You may have this done starting at age 72. Follow these instructions at home: Eating and drinking  Eat a diet that includes fresh fruits and vegetables, whole grains, lean protein, and low-fat dairy products. Limit  your intake of foods with high amounts of sugar, saturated fats, and salt.  Take vitamin and mineral supplements as recommended by your health care provider.  Do not drink alcohol if your health care provider tells you not to drink.  If you drink alcohol: ? Limit how much you have to 0-1 drink a day. ? Be aware of how much alcohol is in your drink. In the [place] one drink equals one 12 oz bottle of beer (355 mL), one 5 oz glass of wine (148 mL), or one 1 oz glass of hard liquor (44 mL). Lifestyle  Take daily care of your teeth and gums.  Stay active. Exercise for at least 30 minutes on 5 or more days each week.  Do not use any products that contain nicotine or tobacco, such as cigarettes, e-cigarettes, and chewing tobacco. If you need help quitting, ask your health care provider.  If you are sexually active, practice safe sex. Use a condom or other form of protection in order to prevent STIs (sexually transmitted infections).  Talk with your health care provider about taking a low-dose aspirin or statin. What's next?  Go to your health care provider once a year for a well check visit.  Ask your health care provider how often you should have your eyes and teeth checked.  Stay up to date on all vaccines. This information is not intended to replace advice given to you by your health care provider. Make sure you discuss any questions you have with your health care provider. Document Revised: 09/13/2018 Document Reviewed: 09/13/2018 Elsevier Patient Education  2020 [employer].

## 2020-07-02 NOTE — Progress Notes (Signed)
Subjective:     Laura Hopkins is a 74 y.o. female and is here for a comprehensive physical exam. The patient reports problems - constipation -- she is using stool softeners and fiber .  Social History   Socioeconomic History  . Marital status: Married    Spouse name: Not on file  . Number of children: 2  . Years of education: Not on file  . Highest education level: Not on file  Occupational History  . Occupation: Public affairs consultant: UNEMPLOYED    Comment: retired  Tobacco Use  . Smoking status: Former Smoker    Packs/day: 0.30    Types: Cigarettes    Quit date: 03/07/1999    Years since quitting: 21.3  . Smokeless tobacco: Never Used  Vaping Use  . Vaping Use: Never used  Substance and Sexual Activity  . Alcohol use: No    Alcohol/week: 0.0 standard drinks  . Drug use: No  . Sexual activity: Never    Partners: Male  Other Topics Concern  . Not on file  Social History Narrative  . Not on file   Social Determinants of Health   Financial Resource Strain:   . Difficulty of Paying Living Expenses: Not on file  Food Insecurity:   . Worried About Charity fundraiser in the Last Year: Not on file  . Ran Out of Food in the Last Year: Not on file  Transportation Needs:   . Lack of Transportation (Medical): Not on file  . Lack of Transportation (Non-Medical): Not on file  Physical Activity:   . Days of Exercise per Week: Not on file  . Minutes of Exercise per Session: Not on file  Stress:   . Feeling of Stress : Not on file  Social Connections:   . Frequency of Communication with Friends and Family: Not on file  . Frequency of Social Gatherings with Friends and Family: Not on file  . Attends Religious Services: Not on file  . Active Member of Clubs or Organizations: Not on file  . Attends Archivist Meetings: Not on file  . Marital Status: Not on file  Intimate Partner Violence:   . Fear of Current or Ex-Partner: Not on file  . Emotionally Abused: Not  on file  . Physically Abused: Not on file  . Sexually Abused: Not on file   Health Maintenance  Topic Date Due  . TETANUS/TDAP  03/07/2019  . MAMMOGRAM  11/22/2019  . INFLUENZA VACCINE  05/03/2020  . COLONOSCOPY  04/12/2026  . DEXA SCAN  Completed  . COVID-19 Vaccine  Completed  . Hepatitis C Screening  Completed  . PNA vac Low Risk Adult  Completed    The following portions of the patient's history were reviewed and updated as appropriate:  She  has a past medical history of Acute pharyngitis (02/26/2009), Arthritis, Essential hypertension (02/07/2007), Headache(784.0), Insomnia (12/23/2017), Lower extremity edema (07/31/2017), Migraine (05/23/2017), SINUSITIS - ACUTE-NOS (03/17/2009), Skin cancer, SKIN CANCER, HX OF (01/08/2008), and URI (02/07/2007). She does not have any pertinent problems on file. She  has a past surgical history that includes Abdominal hysterectomy (1995); Shoulder surgery (2000); Nasal septum surgery (1983); Colonoscopy (2007); Wisdom tooth extraction; Tubal ligation (1978); and Eye surgery (Bilateral, 03/03/2017). Her family history includes Arthritis in her father; Dementia in her mother; Diabetes in her father; Hypertension in her father and mother; Prostate cancer in her father; Skin cancer in her brother. She  reports that she quit smoking about 21  years ago. Her smoking use included cigarettes. She smoked 0.30 packs per day. She has never used smokeless tobacco. She reports that she does not drink alcohol and does not use drugs. She has a current medication list which includes the following prescription(s): acetaminophen, albuterol, alprazolam, apoaequorin, bimatoprost, calcium carbonate, estradiol, flax seed oil, flovent hfa, glucosamine-chondroitin, naproxen sodium, fish oil, sumatriptan, temazepam, valacyclovir, vitamin d, and spironolactone. Current Outpatient Medications on File Prior to Visit  Medication Sig Dispense Refill  . acetaminophen (TYLENOL) 500 MG tablet  Take 500 mg by mouth every 6 (six) hours as needed.    Marland Kitchen albuterol (VENTOLIN HFA) 108 (90 Base) MCG/ACT inhaler Inhale 2 puffs into the lungs every 6 (six) hours as needed for wheezing or shortness of breath. 18 g 2  . ALPRAZolam (XANAX) 0.25 MG tablet Take 1 tablet by mouth 2 (two) times daily as needed.  1  . Apoaequorin (PREVAGEN PO) Take 1 tablet by mouth daily.    . bimatoprost (LATISSE) 0.03 % ophthalmic solution Place 1 drop into both eyes 2 (two) times a day.    . calcium carbonate (OS-CAL) 600 MG TABS tablet Take 1 tablet by mouth 2 (two) times daily with a meal.    . estradiol (VIVELLE-DOT) 0.05 MG/24HR Place 1 patch onto the skin once a week.      . Flaxseed, Linseed, (FLAX SEED OIL) 1000 MG CAPS Take 1 capsule by mouth daily.    . fluticasone (FLOVENT HFA) 110 MCG/ACT inhaler Inhale 2 puffs into the lungs 2 (two) times daily as needed. 1 Inhaler 6  . Glucosamine-Chondroitin (OSTEO BI-FLEX REGULAR STRENGTH PO) Take 1 tablet by mouth daily.     . naproxen sodium (ALEVE) 220 MG tablet Take 220 mg by mouth daily as needed.    . Omega-3 Fatty Acids (FISH OIL) 1000 MG CAPS Take 1 capsule by mouth daily.     . SUMAtriptan (IMITREX) 100 MG tablet TAKE 1 TABLET (100 MG TOTAL) BY MOUTH AS NEEDED.MAX ON INSURANCE 10 tablet 3  . temazepam (RESTORIL) 30 MG capsule Take 1 capsule (30 mg total) by mouth at bedtime as needed for sleep. 30 capsule 2  . valACYclovir (VALTREX) 1000 MG tablet Take 1 tablet (1,000 mg total) by mouth 3 (three) times daily. 30 tablet 1  . VITAMIN D PO Take 1,000 mg by mouth daily.    Marland Kitchen spironolactone (ALDACTONE) 25 MG tablet Take 1 tablet (25 mg total) by mouth daily. 90 tablet 3   No current facility-administered medications on file prior to visit.   She is allergic to avapro [irbesartan], codeine, metoprolol, and norvasc [amlodipine besylate]..  Review of Systems Review of Systems  Constitutional: Negative for activity change, appetite change and fatigue.  HENT:  Negative for hearing loss, congestion, tinnitus and ear discharge.  dentist q11m Eyes: Negative for visual disturbance (see optho q1y -- vision corrected to 20/20 with glasses).  Respiratory: Negative for cough, chest tightness and shortness of breath.   Cardiovascular: Negative for chest pain, palpitations and leg swelling.  Gastrointestinal: Negative for abdominal pain, diarrhea, constipation and abdominal distention.  Genitourinary: Negative for urgency, frequency, decreased urine volume and difficulty urinating.  Musculoskeletal: Negative for back pain, arthralgias and gait problem.  Skin: Negative for color change, pallor and rash.  Neurological: Negative for dizziness, light-headedness, numbness and headaches.  Hematological: Negative for adenopathy. Does not bruise/bleed easily.  Psychiatric/Behavioral: Negative for suicidal ideas, confusion, sleep disturbance, self-injury, dysphoric mood, decreased concentration and agitation.      Objective:  BP 100/60 (BP Location: Right Arm, Patient Position: Sitting, Cuff Size: Normal)   Pulse 66   Temp 98.9 F (37.2 C) (Oral)   Resp 18   Ht 5\' 4"  (1.626 m)   Wt 147 lb 6.4 oz (66.9 kg)   SpO2 96%   BMI 25.30 kg/m  General appearance: alert, cooperative, appears stated age and no distress Head: Normocephalic, without obvious abnormality, atraumatic Eyes: negative findings: lids and lashes normal, conjunctivae and sclerae normal and pupils equal, round, reactive to light and accomodation Ears: normal TM's and external ear canals both ears Nose: Nares normal. Septum midline. Mucosa normal. No drainage or sinus tenderness. Throat: lips, mucosa, and tongue normal; teeth and gums normal Neck: no adenopathy, no carotid bruit, no JVD, supple, symmetrical, trachea midline and thyroid not enlarged, symmetric, no tenderness/mass/nodules Back: symmetric, no curvature. ROM normal. No CVA tenderness. Lungs: clear to auscultation  bilaterally Breasts: gyn Heart: regular rate and rhythm, S1, S2 normal, no murmur, click, rub or gallop Abdomen: soft, non-tender; bowel sounds normal; no masses,  no organomegaly Pelvic: deferred --gyn Extremities: extremities normal, atraumatic, no cyanosis or edema  Pulses: 2+ and symmetric Skin: Skin color, texture, turgor normal. No rashes or lesions Lymph nodes: Cervical, supraclavicular, and axillary nodes normal. Neurologic: Alert and oriented X 3, normal strength and tone. Normal symmetric reflexes. Normal coordination and gait    Assessment:    Healthy female exam.      Plan:    ghm utd Check labs See After Visit Summary for Counseling Recommendations    1. Need for influenza vaccination  - Flu Vaccine QUAD High Dose(Fluad)  2. Insomnia, unspecified type   - NONFORMULARY OR COMPOUNDED ITEM; ashwagandha 300 mg  1 po qhs  Dispense: 30 each; Refill: 1  3. Essential hypertension Well controlled, no changes to meds. Encouraged heart healthy diet such as the DASH diet and exercise as tolerated.  - Lipid panel - Comprehensive metabolic panel - CBC with Differential/Platelet - Ambulatory referral to Chronic Care Management Services  4. Other migraine without status migrainosus, intractable Pt is unable to afford aimovig---- gave pt pt assistance number to call  - Lipid panel - Comprehensive metabolic panel - CBC with Differential/Platelet - Ambulatory referral to Chronic Care Management Services  5. Abrasion of finger, initial encounter   - Td vaccine greater than or equal to 7yo preservative free IM  6. Preventative health care See above

## 2020-07-03 ENCOUNTER — Telehealth: Payer: Self-pay | Admitting: Family Medicine

## 2020-07-03 LAB — CBC WITH DIFFERENTIAL/PLATELET
Absolute Monocytes: 502 cells/uL (ref 200–950)
Basophils Absolute: 84 cells/uL (ref 0–200)
Basophils Relative: 1.1 %
Eosinophils Absolute: 684 cells/uL — ABNORMAL HIGH (ref 15–500)
Eosinophils Relative: 9 %
HCT: 44.4 % (ref 35.0–45.0)
Hemoglobin: 15 g/dL (ref 11.7–15.5)
Lymphs Abs: 1657 cells/uL (ref 850–3900)
MCH: 32.2 pg (ref 27.0–33.0)
MCHC: 33.8 g/dL (ref 32.0–36.0)
MCV: 95.3 fL (ref 80.0–100.0)
MPV: 11.6 fL (ref 7.5–12.5)
Monocytes Relative: 6.6 %
Neutro Abs: 4674 cells/uL (ref 1500–7800)
Neutrophils Relative %: 61.5 %
Platelets: 266 10*3/uL (ref 140–400)
RBC: 4.66 10*6/uL (ref 3.80–5.10)
RDW: 12.4 % (ref 11.0–15.0)
Total Lymphocyte: 21.8 %
WBC: 7.6 10*3/uL (ref 3.8–10.8)

## 2020-07-03 LAB — COMPREHENSIVE METABOLIC PANEL
AG Ratio: 1.8 (calc) (ref 1.0–2.5)
ALT: 7 U/L (ref 6–29)
AST: 19 U/L (ref 10–35)
Albumin: 4.4 g/dL (ref 3.6–5.1)
Alkaline phosphatase (APISO): 66 U/L (ref 37–153)
BUN/Creatinine Ratio: 13 (calc) (ref 6–22)
BUN: 13 mg/dL (ref 7–25)
CO2: 28 mmol/L (ref 20–32)
Calcium: 10.2 mg/dL (ref 8.6–10.4)
Chloride: 102 mmol/L (ref 98–110)
Creat: 1.01 mg/dL — ABNORMAL HIGH (ref 0.60–0.93)
Globulin: 2.4 g/dL (calc) (ref 1.9–3.7)
Glucose, Bld: 90 mg/dL (ref 65–99)
Potassium: 4.7 mmol/L (ref 3.5–5.3)
Sodium: 140 mmol/L (ref 135–146)
Total Bilirubin: 0.7 mg/dL (ref 0.2–1.2)
Total Protein: 6.8 g/dL (ref 6.1–8.1)

## 2020-07-03 LAB — LIPID PANEL
Cholesterol: 191 mg/dL (ref <200)
HDL: 75 mg/dL (ref >=50)
LDL Cholesterol (Calc): 95 mg/dL (calc)
Non-HDL Cholesterol (Calc): 116 mg/dL (calc) (ref <130)
Total CHOL/HDL Ratio: 2.5 (calc) (ref <5.0)
Triglycerides: 115 mg/dL (ref <150)

## 2020-07-03 NOTE — Progress Notes (Signed)
  Chronic Care Management   Note  07/03/2020 Name: Laura Hopkins MRN: [id number] DOB: 04/10/1946  Laura Hopkins is a 74 y.o. year old female who is a primary care patient of Ann Held, DO. I reached out to [name] by phone today in response to a referral sent by Laura Hopkins PCP, Ann Held, DO.   Laura Hopkins was given information about Chronic Care Management services today including:  1. CCM service includes personalized support from designated clinical staff supervised by her physician, including individualized plan of care and coordination with other care providers 2. 24/7 contact phone numbers for assistance for urgent and routine care needs. 3. Service will only be billed when office clinical staff spend 20 minutes or more in a month to coordinate care. 4. Only one practitioner may furnish and bill the service in a calendar month. 5. The patient may stop CCM services at any time (effective at the end of the month) by phone call to the office staff.   Patient agreed to services and verbal consent obtained.   Follow up plan:   Carley Perdue [employer] Scheduler

## 2020-07-07 DIAGNOSIS — G4733 Obstructive sleep apnea (adult) (pediatric): Secondary | ICD-10-CM | POA: Diagnosis not present

## 2020-07-08 DIAGNOSIS — G4733 Obstructive sleep apnea (adult) (pediatric): Secondary | ICD-10-CM | POA: Diagnosis not present

## 2020-07-14 ENCOUNTER — Ambulatory Visit: Payer: Medicare HMO | Attending: Internal Medicine

## 2020-07-14 ENCOUNTER — Other Ambulatory Visit (HOSPITAL_BASED_OUTPATIENT_CLINIC_OR_DEPARTMENT_OTHER): Payer: Self-pay | Admitting: Internal Medicine

## 2020-07-14 DIAGNOSIS — Z23 Encounter for immunization: Secondary | ICD-10-CM

## 2020-07-14 NOTE — Progress Notes (Signed)
   Covid-19 Vaccination Clinic  Name:  Laura Hopkins    MRN: [id number] DOB: 1946-04-24  07/14/2020  [name] was observed post Covid-19 immunization for 15 minutes without incident. She was provided with Vaccine Information Sheet and instruction to access the V-Safe system. Vaccinated by Kristeen Miss.  [name] was instructed to call 911 with any severe reactions post vaccine: Marland Kitchen Difficulty breathing  . Swelling of face and throat  . A fast heartbeat  . A bad rash all over body  . Dizziness and weakness

## 2020-07-21 DIAGNOSIS — R69 Illness, unspecified: Secondary | ICD-10-CM | POA: Diagnosis not present

## 2020-07-22 MED FILL — PFIZER-BIONTECH COVID-19 VA: 30 | 1 days supply | Qty: 0 | Fill #0

## 2020-08-03 DIAGNOSIS — M5030 Other cervical disc degeneration, unspecified cervical region: Secondary | ICD-10-CM | POA: Diagnosis not present

## 2020-08-03 DIAGNOSIS — M47812 Spondylosis without myelopathy or radiculopathy, cervical region: Secondary | ICD-10-CM | POA: Diagnosis not present

## 2020-08-08 DIAGNOSIS — G4733 Obstructive sleep apnea (adult) (pediatric): Secondary | ICD-10-CM | POA: Diagnosis not present

## 2020-08-14 ENCOUNTER — Encounter: Payer: Self-pay | Admitting: Family Medicine

## 2020-08-14 DIAGNOSIS — G4700 Insomnia, unspecified: Secondary | ICD-10-CM

## 2020-08-14 MED ORDER — TEMAZEPAM 30 MG PO CAPS: 30.0000 mg | ORAL_CAPSULE | Freq: Every evening | ORAL | 0 refills | Status: DC | PRN

## 2020-08-14 NOTE — Telephone Encounter (Signed)
Requesting: temazepam Contract:02/13/2020- needs new CSC UDS:02/12/2018 needs new UDS Last Visit:07/02/2020 Next Visit: none scheduled yet Last Refill:12/12/2019 with 2 refills.   Please Advise

## 2020-08-22 ENCOUNTER — Other Ambulatory Visit: Payer: Self-pay | Admitting: Family Medicine

## 2020-08-22 DIAGNOSIS — R519 Headache, unspecified: Secondary | ICD-10-CM

## 2020-08-26 DIAGNOSIS — G4733 Obstructive sleep apnea (adult) (pediatric): Secondary | ICD-10-CM | POA: Diagnosis not present

## 2020-08-29 DIAGNOSIS — R0982 Postnasal drip: Secondary | ICD-10-CM | POA: Diagnosis not present

## 2020-08-29 DIAGNOSIS — Z20822 Contact with and (suspected) exposure to covid-19: Secondary | ICD-10-CM | POA: Diagnosis not present

## 2020-08-29 DIAGNOSIS — R0602 Shortness of breath: Secondary | ICD-10-CM | POA: Diagnosis not present

## 2020-08-29 DIAGNOSIS — R519 Headache, unspecified: Secondary | ICD-10-CM | POA: Diagnosis not present

## 2020-08-31 ENCOUNTER — Telehealth (INDEPENDENT_AMBULATORY_CARE_PROVIDER_SITE_OTHER): Payer: Medicare HMO | Admitting: Medical

## 2020-08-31 ENCOUNTER — Other Ambulatory Visit: Payer: Self-pay

## 2020-08-31 ENCOUNTER — Encounter: Payer: Self-pay | Admitting: Medical

## 2020-08-31 VITALS — BP 140/90

## 2020-08-31 DIAGNOSIS — R110 Nausea: Secondary | ICD-10-CM

## 2020-08-31 DIAGNOSIS — M791 Myalgia, unspecified site: Secondary | ICD-10-CM

## 2020-08-31 DIAGNOSIS — R059 Cough, unspecified: Secondary | ICD-10-CM | POA: Diagnosis not present

## 2020-08-31 DIAGNOSIS — J029 Acute pharyngitis, unspecified: Secondary | ICD-10-CM | POA: Diagnosis not present

## 2020-08-31 MED ORDER — AZITHROMYCIN 250 MG PO TABS
ORAL_TABLET | ORAL | 0 refills | Status: DC
Start: 2020-08-31 — End: 2020-09-02

## 2020-08-31 MED ORDER — BENZONATATE 100 MG PO CAPS: 100.0000 mg | ORAL_CAPSULE | Freq: Three times a day (TID) | ORAL | 0 refills | Status: DC | PRN

## 2020-08-31 MED ORDER — OSELTAMIVIR PHOSPHATE 75 MG PO CAPS
75.0000 mg | ORAL_CAPSULE | Freq: Two times a day (BID) | ORAL | 0 refills | Status: DC
Start: 2020-08-31 — End: 2020-09-15

## 2020-08-31 MED ORDER — ONDANSETRON 4 MG PO TBDP
4.0000 mg | ORAL_TABLET | Freq: Three times a day (TID) | ORAL | 0 refills | Status: DC | PRN
Start: 2020-08-31 — End: 2020-09-02

## 2020-08-31 NOTE — Patient Instructions (Signed)
You appear to have signs/symptoms of bronchitis and pharyngitis.  For this we will prescribe a azithromycin and benzonatate.  Also some of your presentation includes myalgias and fatigue.  Granddaughter diagnosed with the flu on Thursday and you has been in close contact with her for 3 to 4 days consecutively.  Your flu test was negative at urgent care but clinically have some concern.  We will go ahead and prescribe Tamiflu as you are approaching 72 hours post onset of symptoms.  Recent nausea and not taking in food or beverages well.  Will prescribe Zofran for nausea/vomiting.  Make sure you eat bland diet and hydrate well with propel or sugar-free Gatorade.  You have Covid test pending done from the urgent care.  Please update me on those results.  If significant cough persisting towards this weekend then would recommend chest x-ray.  Follow-up in 7 to 10 days or as needed

## 2020-08-31 NOTE — Progress Notes (Signed)
Subjective:    Patient ID: Laura Hopkins, female    DOB: [birth year], 74 y.o.   MRN: [id number]  HPI Virtual Visit via Telephone Note  I connected with [name] on 08/31/20 at  2:20 PM EST by telephone and verified that I am speaking with the correct person using two identifiers.  Location: Patient: home Provider: office   Pt did not check vitals today.   I discussed the limitations, risks, security and privacy concerns of performing an evaluation and management service by telephone and the availability of in person appointments. I also discussed with the patient that there may be a patient responsible charge related to this service. The patient expressed understanding and agreed to proceed.   History of Present Illness: Pt states on Friday night she has gotten body aches, st, scratchy throat and tired. Pt states when swallows has ear pain.   She has some severe cough and some nausea.  Decreased appetite.   Pt went to UC at [facility]. Pt grandaughter tested + for the flu on Wednesday.   Pt tested negative for flu on Saturday.  Pt has had covid vaccines and booster. Pt had covid swab. The results of covid test are pending.    Observations/Objective: General- no acute distress, pleasant alert and oriented Assessment and Plan: You appear to have signs/symptoms of bronchitis and pharyngitis.  For this we will prescribe a azithromycin and benzonatate.  Also some of your presentation includes myalgias and fatigue.  Granddaughter diagnosed with the flu on Thursday and you has been in close contact with her for 3 to 4 days consecutively.  Your flu test was negative at urgent care but clinically have some concern.  We will go ahead and prescribe Tamiflu as you are approaching 72 hours post onset of symptoms.  Recent nausea and not taking in food or beverages well.  Will prescribe Zofran for nausea/vomiting.  Make sure you eat bland diet and hydrate well with propel or sugar-free  Gatorade.  You have Covid test pending done from the urgent care.  Please update me on those results.  If significant cough persisting towards this weekend then would recommend chest x-ray.  Follow-up in 7 to 10 days or as needed  Follow Up Instructions:    I discussed the assessment and treatment plan with the patient. The patient was provided an opportunity to ask questions and all were answered. The patient agreed with the plan and demonstrated an understanding of the instructions.   The patient was advised to call back or seek an in-person evaluation if the symptoms worsen or if the condition fails to improve as anticipated.  Time spent with patient today was 20  minutes which consisted of chart review, discussing diagnosis, work up, treatment and documentation.   Mackie Pai, PA-C    Review of Systems  Constitutional: Positive for fatigue. Negative for chills and fever.  HENT: Positive for congestion and sore throat. Negative for postnasal drip, rhinorrhea, sinus pressure and sneezing.   Respiratory: Positive for cough. Negative for shortness of breath and wheezing.   Gastrointestinal: Negative for abdominal pain.  Genitourinary: Negative for difficulty urinating, dysuria and frequency.  Musculoskeletal: Positive for myalgias.  Skin: Negative for rash.  Hematological: Negative for adenopathy. Does not bruise/bleed easily.  Psychiatric/Behavioral: Negative for behavioral problems, confusion, dysphoric mood and hallucinations. The patient is not nervous/anxious.    Past Medical History:  Diagnosis Date  . Acute pharyngitis 02/26/2009   Qualifier: Diagnosis of  By: Etter Sjogren DO,  Kendrick Fries    . Arthritis    hands and neck   . Essential hypertension 02/07/2007   Qualifier: Diagnosis of  By: Jerold Coombe    . Headache(784.0)   . Insomnia 12/23/2017  . Lower extremity edema 07/31/2017  . Migraine 05/23/2017  . SINUSITIS - ACUTE-NOS 03/17/2009   Qualifier: Diagnosis of  By: Jerold Coombe    . Skin cancer    basal cell  . 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 Amy Martinique   . URI 02/07/2007   Qualifier: Diagnosis of  By: Jerold Coombe       Social History   Socioeconomic History  . Marital status: Married    Spouse name: Not on file  . Number of children: 2  . Years of education: Not on file  . Highest education level: Not on file  Occupational History  . Occupation: Public affairs consultant: UNEMPLOYED    Comment: retired  Tobacco Use  . Smoking status: Former Smoker    Packs/day: 0.30    Types: Cigarettes    Quit date: 03/07/1999    Years since quitting: 21.5  . Smokeless tobacco: Never Used  Vaping Use  . Vaping Use: Never used  Substance and Sexual Activity  . Alcohol use: No    Alcohol/week: 0.0 standard drinks  . Drug use: No  . Sexual activity: Never    Partners: Male  Other Topics Concern  . Not on file  Social History Narrative  . Not on file   Social Determinants of Health   Financial Resource Strain:   . Difficulty of Paying Living Expenses: Not on file  Food Insecurity:   . Worried About Charity fundraiser in the Last Year: Not on file  . Ran Out of Food in the Last Year: Not on file  Transportation Needs:   . Lack of Transportation (Medical): Not on file  . Lack of Transportation (Non-Medical): Not on file  Physical Activity:   . Days of Exercise per Week: Not on file  . Minutes of Exercise per Session: Not on file  Stress:   . Feeling of Stress : Not on file  Social Connections:   . Frequency of Communication with Friends and Family: Not on file  . Frequency of Social Gatherings with Friends and Family: Not on file  . Attends Religious Services: Not on file  . Active Member of Clubs or Organizations: Not on file  . Attends Archivist Meetings: Not on file  . Marital Status: Not  on file  Intimate Partner Violence:   . Fear of Current or Ex-Partner: Not on file  . Emotionally Abused: Not on file  . Physically Abused: Not on file  . Sexually Abused: Not on file    Past Surgical History:  Procedure Laterality Date  . ABDOMINAL HYSTERECTOMY  1995  . COLONOSCOPY  2007   colon--negative  . EYE SURGERY Bilateral 03/03/2017   cataract sx with lens implant  . NASAL SEPTUM SURGERY  1983  . SHOULDER SURGERY  2000  . TUBAL LIGATION  1978  . WISDOM TOOTH EXTRACTION      Family History  Problem Relation Age of Onset  . Diabetes Father   . Hypertension Father   . Arthritis Father   . Prostate cancer Father   . Skin  cancer Brother        melanoma  . Hypertension Mother   . Dementia Mother   . Colon cancer Neg Hx   . Colon polyps Neg Hx   . Esophageal cancer Neg Hx   . Rectal cancer Neg Hx   . Stomach cancer Neg Hx     Allergies  Allergen Reactions  . Avapro [Irbesartan] Cough  . Codeine Other (See Comments)    Makes pt hyper   . Metoprolol Cough    Metallic taste , nasal drainage   . Norvasc [Amlodipine Besylate] Swelling    Current Outpatient Medications on File Prior to Visit  Medication Sig Dispense Refill  . acetaminophen (TYLENOL) 500 MG tablet Take 500 mg by mouth every 6 (six) hours as needed.    Marland Kitchen albuterol (VENTOLIN HFA) 108 (90 Base) MCG/ACT inhaler Inhale 2 puffs into the lungs every 6 (six) hours as needed for wheezing or shortness of breath. 18 g 2  . ALPRAZolam (XANAX) 0.25 MG tablet Take 1 tablet by mouth 2 (two) times daily as needed.  1  . Apoaequorin (PREVAGEN PO) Take 1 tablet by mouth daily.    . bimatoprost (LATISSE) 0.03 % ophthalmic solution Place 1 drop into both eyes 2 (two) times a day.    . calcium carbonate (OS-CAL) 600 MG TABS tablet Take 1 tablet by mouth 2 (two) times daily with a meal.    . estradiol (VIVELLE-DOT) 0.05 MG/24HR Place 1 patch onto the skin once a week.      . Flaxseed, Linseed, (FLAX SEED OIL) 1000 MG  CAPS Take 1 capsule by mouth daily.    . fluticasone (FLOVENT HFA) 110 MCG/ACT inhaler Inhale 2 puffs into the lungs 2 (two) times daily as needed. 1 Inhaler 6  . Glucosamine-Chondroitin (OSTEO BI-FLEX REGULAR STRENGTH PO) Take 1 tablet by mouth daily.     . naproxen sodium (ALEVE) 220 MG tablet Take 220 mg by mouth daily as needed.    . NONFORMULARY OR COMPOUNDED ITEM ashwagandha 300 mg  1 po qhs 30 each 1  . Omega-3 Fatty Acids (FISH OIL) 1000 MG CAPS Take 1 capsule by mouth daily.     . promethazine (PHENERGAN) 25 MG tablet TAKE 1 TABLET (25 MG TOTAL) BY MOUTH EVERY 8 (EIGHT) HOURS AS NEEDED FOR NAUSEA OR VOMITING. 10 tablet 1  . SUMAtriptan (IMITREX) 100 MG tablet TAKE 1 TABLET (100 MG TOTAL) BY MOUTH AS NEEDED.MAX ON INSURANCE 10 tablet 3  . temazepam (RESTORIL) 30 MG capsule Take 1 capsule (30 mg total) by mouth at bedtime as needed for sleep. 30 capsule 0  . valACYclovir (VALTREX) 1000 MG tablet Take 1 tablet (1,000 mg total) by mouth 3 (three) times daily. 30 tablet 1  . VITAMIN D PO Take 1,000 mg by mouth daily.    Marland Kitchen spironolactone (ALDACTONE) 25 MG tablet Take 1 tablet (25 mg total) by mouth daily. 90 tablet 3   No current facility-administered medications on file prior to visit.    BP 140/90 Comment: on saturday bp was controlled.      Objective:   Physical Exam        Assessment & Plan:

## 2020-09-01 ENCOUNTER — Emergency Department (HOSPITAL_BASED_OUTPATIENT_CLINIC_OR_DEPARTMENT_OTHER): Payer: Medicare HMO

## 2020-09-01 ENCOUNTER — Telehealth: Payer: Self-pay

## 2020-09-01 ENCOUNTER — Other Ambulatory Visit (HOSPITAL_BASED_OUTPATIENT_CLINIC_OR_DEPARTMENT_OTHER): Payer: Self-pay | Admitting: Emergency Medicine

## 2020-09-01 ENCOUNTER — Encounter (HOSPITAL_BASED_OUTPATIENT_CLINIC_OR_DEPARTMENT_OTHER): Payer: Self-pay | Admitting: *Deleted

## 2020-09-01 ENCOUNTER — Other Ambulatory Visit: Payer: Self-pay

## 2020-09-01 ENCOUNTER — Emergency Department (HOSPITAL_BASED_OUTPATIENT_CLINIC_OR_DEPARTMENT_OTHER)
Admission: EM | Admit: 2020-09-01 | Discharge: 2020-09-01 | Disposition: A | Discharge status: Home / Self Care | Payer: Medicare HMO | Attending: Emergency Medicine | Admitting: Emergency Medicine

## 2020-09-01 DIAGNOSIS — Z87891 Personal history of nicotine dependence: Secondary | ICD-10-CM | POA: Insufficient documentation

## 2020-09-01 DIAGNOSIS — Z20822 Contact with and (suspected) exposure to covid-19: Secondary | ICD-10-CM | POA: Diagnosis not present

## 2020-09-01 DIAGNOSIS — K5730 Diverticulosis of large intestine without perforation or abscess without bleeding: Secondary | ICD-10-CM | POA: Diagnosis not present

## 2020-09-01 DIAGNOSIS — K5190 Ulcerative colitis, unspecified, without complications: Secondary | ICD-10-CM | POA: Insufficient documentation

## 2020-09-01 DIAGNOSIS — R1084 Generalized abdominal pain: Secondary | ICD-10-CM | POA: Diagnosis not present

## 2020-09-01 DIAGNOSIS — I10 Essential (primary) hypertension: Secondary | ICD-10-CM | POA: Diagnosis not present

## 2020-09-01 DIAGNOSIS — Z85828 Personal history of other malignant neoplasm of skin: Secondary | ICD-10-CM | POA: Diagnosis not present

## 2020-09-01 DIAGNOSIS — K529 Noninfective gastroenteritis and colitis, unspecified: Secondary | ICD-10-CM

## 2020-09-01 DIAGNOSIS — K6389 Other specified diseases of intestine: Secondary | ICD-10-CM | POA: Diagnosis not present

## 2020-09-01 DIAGNOSIS — K625 Hemorrhage of anus and rectum: Secondary | ICD-10-CM | POA: Diagnosis present

## 2020-09-01 DIAGNOSIS — J09X2 Influenza due to identified novel influenza A virus with other respiratory manifestations: Secondary | ICD-10-CM | POA: Diagnosis not present

## 2020-09-01 DIAGNOSIS — Z79899 Other long term (current) drug therapy: Secondary | ICD-10-CM | POA: Diagnosis not present

## 2020-09-01 DIAGNOSIS — J101 Influenza due to other identified influenza virus with other respiratory manifestations: Secondary | ICD-10-CM | POA: Diagnosis not present

## 2020-09-01 DIAGNOSIS — R059 Cough, unspecified: Secondary | ICD-10-CM | POA: Diagnosis not present

## 2020-09-01 LAB — CBC WITH DIFFERENTIAL/PLATELET
Abs Immature Granulocytes: 0.03 10*3/uL (ref 0.00–0.07)
Basophils Absolute: 0 10*3/uL (ref 0.0–0.1)
Basophils Relative: 0 %
Eosinophils Absolute: 0 10*3/uL (ref 0.0–0.5)
Eosinophils Relative: 0 %
HCT: 43.6 % (ref 36.0–46.0)
Hemoglobin: 14.8 g/dL (ref 12.0–15.0)
Immature Granulocytes: 0 %
Lymphocytes Relative: 14 %
Lymphs Abs: 1.1 10*3/uL (ref 0.7–4.0)
MCH: 32 pg (ref 26.0–34.0)
MCHC: 33.9 g/dL (ref 30.0–36.0)
MCV: 94.4 fL (ref 80.0–100.0)
Monocytes Absolute: 0.9 10*3/uL (ref 0.1–1.0)
Monocytes Relative: 11 %
Neutro Abs: 6.3 10*3/uL (ref 1.7–7.7)
Neutrophils Relative %: 75 %
Platelets: 233 10*3/uL (ref 150–400)
RBC: 4.62 MIL/uL (ref 3.87–5.11)
RDW: 13.6 % (ref 11.5–15.5)
WBC: 8.4 10*3/uL (ref 4.0–10.5)
nRBC: 0 % (ref 0.0–0.2)

## 2020-09-01 LAB — COMPREHENSIVE METABOLIC PANEL
ALT: 10 U/L (ref 0–44)
AST: 26 U/L (ref 15–41)
Albumin: 4 g/dL (ref 3.5–5.0)
Alkaline Phosphatase: 51 U/L (ref 38–126)
Anion gap: 9 (ref 5–15)
BUN: 11 mg/dL (ref 8–23)
CO2: 27 mmol/L (ref 22–32)
Calcium: 9.3 mg/dL (ref 8.9–10.3)
Chloride: 98 mmol/L (ref 98–111)
Creatinine, Ser: 0.79 mg/dL (ref 0.44–1.00)
GFR, Estimated: >60 mL/min (ref >60)
Glucose, Bld: 119 mg/dL — ABNORMAL HIGH (ref 70–99)
Potassium: 4 mmol/L (ref 3.5–5.1)
Sodium: 134 mmol/L — ABNORMAL LOW (ref 135–145)
Total Bilirubin: 0.5 mg/dL (ref 0.3–1.2)
Total Protein: 6.9 g/dL (ref 6.5–8.1)

## 2020-09-01 LAB — RESP PANEL BY RT-PCR (FLU A&B, COVID) ARPGX2
Influenza A by PCR: POSITIVE — AB
Influenza B by PCR: NEGATIVE
SARS Coronavirus 2 by RT PCR: NEGATIVE

## 2020-09-01 LAB — URINALYSIS, ROUTINE W REFLEX MICROSCOPIC
Bilirubin Urine: NEGATIVE
Glucose, UA: NEGATIVE mg/dL
Hgb urine dipstick: NEGATIVE
Ketones, ur: NEGATIVE mg/dL
Leukocytes,Ua: NEGATIVE
Nitrite: NEGATIVE
Protein, ur: NEGATIVE mg/dL
Specific Gravity, Urine: <1.005 — ABNORMAL LOW (ref 1.005–1.030)
pH: 6 (ref 5.0–8.0)

## 2020-09-01 LAB — LIPASE, BLOOD: Lipase: 21 U/L (ref 11–51)

## 2020-09-01 MED ORDER — SODIUM CHLORIDE 0.9 % IV BOLUS
1000.0000 mL | Freq: Once | INTRAVENOUS | Status: AC
  Administered 2020-09-01: 1000 mL via INTRAVENOUS

## 2020-09-01 MED ORDER — ACETAMINOPHEN 500 MG PO TABS
1000.0000 mg | ORAL_TABLET | Freq: Once | ORAL | Status: AC
  Administered 2020-09-01: 1000 mg via ORAL
  Filled 2020-09-01: qty 2

## 2020-09-01 MED ORDER — CIPROFLOXACIN HCL 500 MG PO TABS
500.0000 mg | ORAL_TABLET | Freq: Two times a day (BID) | ORAL | 0 refills | Status: DC
Start: 2020-09-01 — End: 2020-09-15

## 2020-09-01 MED ORDER — IOHEXOL 300 MG/ML  SOLN
100.0000 mL | Freq: Once | INTRAMUSCULAR | Status: AC | PRN
  Administered 2020-09-01: 100 mL via INTRAVENOUS

## 2020-09-01 MED ORDER — METRONIDAZOLE 500 MG PO TABS
500.0000 mg | ORAL_TABLET | Freq: Three times a day (TID) | ORAL | 0 refills | Status: DC
Start: 2020-09-01 — End: 2020-09-15

## 2020-09-01 MED FILL — METRONIDAZOLE 500 MG TABS: 500 | 7 days supply | Qty: 21 | Fill #0

## 2020-09-01 MED FILL — CIPROFLOXACIN HCL 500 MG TA: 500 | 7 days supply | Qty: 14 | Fill #0

## 2020-09-01 NOTE — Discharge Instructions (Addendum)
Stop taking Zithromax.  Begin taking Cipro and Flagyl as prescribed today.  Continue other medications as previously prescribed.  Return to the emergency department for worsening abdominal pain, high fever, bloody stool, or other new and concerning symptoms.

## 2020-09-01 NOTE — ED Triage Notes (Addendum)
C.o flu/Covid like symptoms for 4 days. She had an UC visit 3 days ago for same symptoms where she had a negative Covid test. No known exposure. She had a virtual doctors visit yesterday and was given Rx's due to not feeling better. Bloody diarrhea this am.

## 2020-09-01 NOTE — ED Notes (Signed)
Pt aware urine collection needed.

## 2020-09-01 NOTE — ED Provider Notes (Signed)
[facility] EMERGENCY DEPARTMENT Provider Note   CSN: [id number] Arrival date & time: 09/01/20  1149     History Chief Complaint  Patient presents with   Rectal Bleeding    Laura Hopkins is a 74 y.o. female.  Patient is a 74 year old female with past medical history of hypertension, arthritis, obstructive sleep apnea.  She presents today for evaluation of abdominal pain and "flulike symptoms".  Patient states she has been sick for the past 4 days.  She describes cough, body aches, subjective fevers.  She was seen in urgent care 2 days ago and had a negative Covid test.  Since that time, patient has been having loose stools that have been tinged with blood.  She does describe some generalized abdominal pain, but no vomiting.  She reports decreased p.o. intake and is concerned she may be dehydrated.  The history is provided by the patient.  Rectal Bleeding Duration:  1 day Chronicity:  New Relieved by:  Nothing Worsened by:  Nothing Ineffective treatments:  None tried      Past Medical History:  Diagnosis Date   Acute pharyngitis 02/26/2009   Qualifier: Diagnosis of  By: Jerold Coombe     Arthritis    hands and neck    Essential hypertension 02/07/2007   Qualifier: Diagnosis of  By: Jerold Coombe     Headache(784.0)    Insomnia 12/23/2017   Lower extremity edema 07/31/2017   Migraine 05/23/2017   SINUSITIS - ACUTE-NOS 03/17/2009   Qualifier: Diagnosis of  By: Jerold Coombe     Skin cancer    basal cell   SKIN CANCER, HX OF 01/08/2008   Annotation: BSC and SCC Qualifier: Diagnosis of  By: Jerold Coombe   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   Coughing 12/24/2019   Bronchitis 11/06/2019   SOB (shortness of breath) 03/11/2019   Insomnia 12/23/2017   Lower extremity edema 07/31/2017   Migraine 05/23/2017   SINUSITIS - ACUTE-NOS 03/17/2009   ACUTE PHARYNGITIS 02/26/2009   SKIN CANCER, HX OF 01/08/2008   Essential hypertension 02/07/2007   URI 02/07/2007   HEADACHE 02/07/2007    Past Surgical History:  Procedure Laterality Date   ABDOMINAL HYSTERECTOMY  1995   COLONOSCOPY  2007   colon--negative   EYE SURGERY Bilateral 03/03/2017   cataract sx with lens implant   NASAL SEPTUM SURGERY  1983   SHOULDER SURGERY  2000   TUBAL LIGATION  1978   WISDOM TOOTH EXTRACTION       OB History   No obstetric history on file.     Family History  Problem Relation Age of Onset   Diabetes Father    Hypertension Father    Arthritis Father    Prostate cancer Father    Skin cancer Brother        melanoma   Hypertension Mother    Dementia Mother    Colon cancer Neg Hx    Colon polyps Neg Hx    Esophageal  cancer Neg Hx    Rectal cancer Neg Hx    Stomach cancer Neg Hx     Social History   Tobacco Use   Smoking status: Former Smoker    Packs/day: 0.30    Types: Cigarettes    Quit date: 03/07/1999    Years since quitting: 21.5   Smokeless tobacco: Never Used  Vaping Use   Vaping Use: Never used  Substance Use Topics   Alcohol use: No    Alcohol/week: 0.0 standard drinks   Drug use: No    Home Medications Prior to Admission medications   Medication Sig Start Date End Date Taking? Authorizing Provider  acetaminophen (TYLENOL) 500 MG tablet Take 500 mg by mouth every 6 (six) hours as needed.    [provider]  albuterol (VENTOLIN HFA) 108 (90 Base) MCG/ACT inhaler Inhale 2 puffs into the lungs every 6 (six) hours  as needed for wheezing or shortness of breath. 09/25/19   Ann Held, DO  ALPRAZolam (XANAX) 0.25 MG tablet Take 1 tablet by mouth 2 (two) times daily as needed. 08/13/15   [provider]  Apoaequorin (PREVAGEN PO) Take 1 tablet by mouth daily.    [provider]  azithromycin (ZITHROMAX) 250 MG tablet Take 2 tablets by mouth on day 1, followed by 1 tablet by mouth daily for 4 days. 08/31/20   Saguier, Percell Miller, PA-C  benzonatate (TESSALON) 100 MG capsule Take 1 capsule (100 mg total) by mouth 3 (three) times daily as needed. 08/31/20   Saguier, Percell Miller, PA-C  bimatoprost (LATISSE) 0.03 % ophthalmic solution Place 1 drop into both eyes 2 (two) times a day. 10/30/18   [provider]  calcium carbonate (OS-CAL) 600 MG TABS tablet Take 1 tablet by mouth 2 (two) times daily with a meal.    [provider]  estradiol (VIVELLE-DOT) 0.05 MG/24HR Place 1 patch onto the skin once a week.      [provider]  Flaxseed, Linseed, (FLAX SEED OIL) 1000 MG CAPS Take 1 capsule by mouth daily.    [provider]  fluticasone (FLOVENT HFA) 110 MCG/ACT inhaler Inhale 2 puffs into the lungs 2 (two) times daily as needed. 12/12/19   Ann Held, DO  Glucosamine-Chondroitin (OSTEO BI-FLEX REGULAR STRENGTH PO) Take 1 tablet by mouth daily.     [provider]  naproxen sodium (ALEVE) 220 MG tablet Take 220 mg by mouth daily as needed.    [provider]  NONFORMULARY OR COMPOUNDED ITEM ashwagandha 300 mg  1 po qhs 07/02/20   Lowne Chase, Alferd Apa, DO  Omega-3 Fatty Acids (FISH OIL) 1000 MG CAPS Take 1 capsule by mouth daily.     [provider]  ondansetron (ZOFRAN ODT) 4 MG disintegrating tablet Take 1 tablet (4 mg total) by mouth every 8 (eight) hours as needed for nausea or vomiting. 08/31/20   Saguier, Percell Miller, PA-C  oseltamivir (TAMIFLU) 75 MG capsule Take 1 capsule (75 mg total) by mouth 2 (two) times daily. 08/31/20    Saguier, Percell Miller, PA-C  promethazine (PHENERGAN) 25 MG tablet TAKE 1 TABLET (25 MG TOTAL) BY MOUTH EVERY 8 (EIGHT) HOURS AS NEEDED FOR NAUSEA OR VOMITING. 08/24/20   Carollee Herter, Alferd Apa, DO  spironolactone (ALDACTONE) 25 MG tablet Take 1 tablet (25 mg total) by mouth daily. 12/30/19 03/29/20  Ann Held, DO  SUMAtriptan (IMITREX) 100 MG tablet TAKE 1 TABLET (100 MG TOTAL) BY MOUTH AS NEEDED.MAX ON INSURANCE 06/04/20  Carollee Herter, Yvonne R, DO  temazepam (RESTORIL) 30 MG capsule Take 1 capsule (30 mg total) by mouth at bedtime as needed for sleep. 08/14/20   Carollee Herter, Alferd Apa, DO  valACYclovir (VALTREX) 1000 MG tablet Take 1 tablet (1,000 mg total) by mouth 3 (three) times daily. 12/26/19   Ann Held, DO  VITAMIN D PO Take 1,000 mg by mouth daily.    [provider]    Allergies    Avapro [irbesartan], Codeine, Metoprolol, and Norvasc [amlodipine besylate]  Review of Systems   Review of Systems  Gastrointestinal: Positive for hematochezia.  All other systems reviewed and are negative.   Physical Exam Updated Vital Signs BP (!) 139/96    Pulse 78    Temp 98 F (36.7 C) (Oral)    Resp 18    Ht 5\' 4"  (1.626 m)    Wt 66.9 kg    SpO2 97%    BMI 25.32 kg/m   Physical Exam Vitals and nursing note reviewed.  Constitutional:      General: She is not in acute distress.    Appearance: She is well-developed. She is not diaphoretic.  HENT:     Head: Normocephalic and atraumatic.  Cardiovascular:     Rate and Rhythm: Normal rate and regular rhythm.     Heart sounds: No murmur heard.  No friction rub. No gallop.   Pulmonary:     Effort: Pulmonary effort is normal. No respiratory distress.     Breath sounds: Normal breath sounds. No wheezing.  Abdominal:     General: Bowel sounds are normal. There is no distension.     Palpations: Abdomen is soft.     Tenderness: There is abdominal tenderness. There is no right CVA tenderness, left CVA tenderness, guarding or  rebound.  Musculoskeletal:        General: No swelling or tenderness. Normal range of motion.     Cervical back: Normal range of motion and neck supple.     Right lower leg: No edema.     Left lower leg: No edema.  Skin:    General: Skin is warm and dry.  Neurological:     Mental Status: She is alert and oriented to person, place, and time.     ED Results / Procedures / Treatments   Labs (all labs ordered are listed, but only abnormal results are displayed) Labs Reviewed  RESP PANEL BY RT-PCR (FLU A&B, COVID) [id number]  COMPREHENSIVE METABOLIC PANEL  LIPASE, BLOOD  CBC WITH DIFFERENTIAL/PLATELET  URINALYSIS, ROUTINE W REFLEX MICROSCOPIC    EKG None  Radiology No results found.  Procedures Procedures (including critical care time)  Medications Ordered in ED Medications  sodium chloride 0.9 % bolus 1,000 mL (has no administration in time range)    ED Course  I have reviewed the triage vital signs and the nursing notes.  Pertinent labs & imaging results that were available during my care of the patient were reviewed by me and considered in my medical decision making (see chart for details).    MDM Rules/Calculators/A&P  Patient presenting here with what she describes as "flulike symptoms".  She describes both upper respiratory complaints for several days and is now having loose, bloody stools.  Patient's work-up shows a positive influenza A test along with CT scan showing colitis of the descending colon.  Patient to be discharged with Cipro and Flagyl.  She was prescribed Zithromax and Tamiflu by her primary doctor.  I have instructed  her to stop the Zithromax but she may continue the Tamiflu.  She is to follow-up and return if symptoms worsen.  Final Clinical Impression(s) / ED Diagnoses Final diagnoses:  None    Rx / DC Orders ED Discharge Orders    None       Veryl Speak, MD 09/02/20 [phone]

## 2020-09-01 NOTE — ED Notes (Signed)
Review D/C papers with pt, reviewed Rx with pt, pt states understanding, pt denies questions at this time. 

## 2020-09-01 NOTE — Telephone Encounter (Signed)
Nurse Assessment Nurse: Claiborne Billings, RN, Kim Date/Time (Eastern Time): 09/01/2020 11:18:57 AM Confirm and document reason for call. If symptomatic, describe symptoms. ---Caller states his wife is passing bright red blood in her stools, began at 530 this morning. States she was shaking uncontrollably earlier but that has stopped. Does the patient have any new or worsening symptoms? ---Yes Will a triage be completed? ---Yes Related visit to physician within the last 2 weeks? ---Yes Does the PT have any chronic conditions? (i.e. diabetes, asthma, this includes High risk factors for pregnancy, etc.) ---Yes List chronic conditions. ---Migraines, HTN Is this a behavioral health or substance abuse call? ---No Guidelines Guideline Title Affirmed Question Affirmed Notes Nurse Date/Time Eilene Ghazi Time) Rectal Bleeding Severe dizziness (e.g., unable to stand, requires support to walk, feels like passing out now) Claiborne Billings, Therapist, sports, Maudie Mercury 09/01/2020 11:21:50 AM Disp. Time Eilene Ghazi Time) Disposition Final User 09/01/2020 11:23:02 AM Go to ED Now Yes Claiborne Billings, RN, Max Sane Disagree/Comply Comply PLEASE NOTE: All timestamps contained within this report are represented as [place] Standard Time. CONFIDENTIALTY NOTICE: This fax transmission is intended only for the addressee. It contains information that is legally privileged, confidential or otherwise protected from use or disclosure. If you are not the intended recipient, you are strictly prohibited from reviewing, disclosing, copying using or disseminating any of this information or taking any action in reliance on or regarding this information. If you have received this fax in error, please notify us immediately by telephone so that we can arrange for its return to [place]. Phone: [phone], Toll-Free: [phone], Fax: [phone] Page: 2 of 2 Call Id: [id number] [place] Understands Yes PreDisposition InappropriateToAsk Care Advice Given Per Guideline GO TO ED NOW:  * You need to be seen in the Emergency Department. * Go to the ED at ___________ [place] now. Drive carefully. DRIVING: * Another adult should drive. BRING MEDICINES: * Bring a list of your current medicines when you go to the Emergency Department (ER). CARE ADVICE given per Rectal Bleeding (Adult) guideline. Referrals GO TO FACILITY OTHER - SPECIFY  Pt currently in ED.

## 2020-09-02 ENCOUNTER — Encounter: Payer: Self-pay | Admitting: Family Medicine

## 2020-09-02 ENCOUNTER — Telehealth (INDEPENDENT_AMBULATORY_CARE_PROVIDER_SITE_OTHER): Payer: Medicare HMO | Admitting: Family Medicine

## 2020-09-02 DIAGNOSIS — J101 Influenza due to other identified influenza virus with other respiratory manifestations: Secondary | ICD-10-CM | POA: Diagnosis not present

## 2020-09-02 DIAGNOSIS — K529 Noninfective gastroenteritis and colitis, unspecified: Secondary | ICD-10-CM | POA: Diagnosis not present

## 2020-09-02 MED ORDER — TRIMETHOBENZAMIDE HCL 300 MG PO CAPS: ORAL_CAPSULE | ORAL | 0 refills | Status: DC

## 2020-09-02 MED ORDER — DICYCLOMINE HCL 10 MG PO CAPS: ORAL_CAPSULE | ORAL | 0 refills | Status: DC

## 2020-09-02 NOTE — Progress Notes (Signed)
Chief Complaint  Patient presents with  . Influenza    went to ED and has questions regarding that visit  . Blood In Stools    diarrhea  . Anorexia    Subjective: Patient is a 74 y.o. female here for ED f/u. Due to COVID-19 pandemic, we are interacting via telephone. I verified patient's ID using 2 identifiers. Patient agreed to proceed with visit via this method. Patient is at home, I am at office. Patient and I are present for visit.   Patient was seen in the emergency department yesterday and diagnosed with influenza A and colitis.  She was prescribed Flagyl and ciprofloxacin.  She had been seen 2 days ago for similar symptoms and prescribed Tamiflu with a Z-Pak.  She is not having any fevers but continues to have some nausea, abdominal pain/cramping, and loose stools.  She is having frequency as well.  She is wondering if she can take anything to help with the diarrhea.  She is having some blood as well.  Her hemoglobin and blood count were normal in the emergency department.  Past Medical History:  Diagnosis Date  . Acute pharyngitis 02/26/2009   Qualifier: Diagnosis of  By: Jerold Coombe    . Arthritis    hands and neck   . Essential hypertension 02/07/2007   Qualifier: Diagnosis of  By: Jerold Coombe    . Headache(784.0)   . Insomnia 12/23/2017  . Lower extremity edema 07/31/2017  . Migraine 05/23/2017  . SINUSITIS - ACUTE-NOS 03/17/2009   Qualifier: Diagnosis of  By: Jerold Coombe    . Skin cancer    basal cell  . 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 Amy Martinique   . URI 02/07/2007   Qualifier: Diagnosis of  By: Jerold Coombe      Objective: No conversational dyspnea Age appropriate judgment and insight Nml affect and mood  Assessment and Plan: Colitis - Plan: trimethobenzamide (TIGAN) 300 MG capsule, dicyclomine (BENTYL) 10 MG  capsule  Influenza A  Continue Cipro and Flagyl for colitis.  Add Tigan as needed for nausea as this will not affect the QT interval as she is on Cipro.  Add Bentyl for as needed use for cramping.  Avoid antidiarrheals.  Push fluids.  Follow-up in around 5 days if no improvement. Total time: 11 min The patient voiced understanding and agreement to the plan.  [place], DO 09/02/20  11:28 AM

## 2020-09-03 ENCOUNTER — Telehealth: Payer: Self-pay | Admitting: Family Medicine

## 2020-09-03 ENCOUNTER — Ambulatory Visit: Payer: Medicare HMO

## 2020-09-03 NOTE — Progress Notes (Signed)
  Chronic Care Management   Note  09/03/2020 Name: Laura Hopkins MRN: [id number] DOB: 08-23-1946  Laura Hopkins is a 74 y.o. year old female who is a primary care patient of Ann Held, DO. I reached out to [name] by phone today in response to a referral sent by Laura Hopkins PCP, Ann Held, DO.   Laura Hopkins was given information about Chronic Care Management services today including:  1. CCM service includes personalized support from designated clinical staff supervised by her physician, including individualized plan of care and coordination with other care providers 2. 24/7 contact phone numbers for assistance for urgent and routine care needs. 3. Service will only be billed when office clinical staff spend 20 minutes or more in a month to coordinate care. 4. Only one practitioner may furnish and bill the service in a calendar month. 5. The patient may stop CCM services at any time (effective at the end of the month) by phone call to the office staff.   Patient wishes to consider information provided and/or speak with a member of the care team before deciding about enrollment in care management services.   Follow up plan:   Carley Perdue [employer] Scheduler

## 2020-09-04 ENCOUNTER — Telehealth: Payer: Self-pay

## 2020-09-04 NOTE — Telephone Encounter (Signed)
We can send in more Phenergan if that works for her. The alternative would be a low dose (4 mg) of ondansetron every 12 hours while she is on Cipro. Ty.

## 2020-09-04 NOTE — Telephone Encounter (Signed)
Spoke with pharmacy. Pharmacy states they are out of trimethobenzamide and states surrounding pharmacies are out of stock as well. Please advise on replacement.

## 2020-09-07 DIAGNOSIS — G4733 Obstructive sleep apnea (adult) (pediatric): Secondary | ICD-10-CM | POA: Diagnosis not present

## 2020-09-07 MED ORDER — ONDANSETRON HCL 4 MG PO TABS: 4.0000 mg | ORAL_TABLET | Freq: Two times a day (BID) | ORAL | 0 refills | Status: DC

## 2020-09-07 NOTE — Telephone Encounter (Signed)
Zofran sent in

## 2020-09-07 NOTE — Addendum Note (Signed)
Addended by: Sanda Linger on: 09/07/2020 09:18 AM   Modules accepted: Orders

## 2020-09-14 ENCOUNTER — Telehealth: Payer: Self-pay | Admitting: Family Medicine

## 2020-09-14 NOTE — Progress Notes (Signed)
  Chronic Care Management   Outreach Note  09/14/2020 Name: Laura Hopkins MRN: [id number] DOB: 1945-12-25  Referred by: Ann Held, DO Reason for referral : No chief complaint on file.   A second unsuccessful telephone outreach was attempted today. The patient was referred to pharmacist for assistance with care management and care coordination.  Follow Up Plan:   Carley Perdue [employer] Scheduler

## 2020-09-15 ENCOUNTER — Telehealth: Payer: Self-pay | Admitting: Family Medicine

## 2020-09-15 ENCOUNTER — Encounter: Payer: Self-pay | Admitting: Family Medicine

## 2020-09-15 ENCOUNTER — Ambulatory Visit (INDEPENDENT_AMBULATORY_CARE_PROVIDER_SITE_OTHER): Payer: Medicare HMO | Admitting: Family Medicine

## 2020-09-15 ENCOUNTER — Other Ambulatory Visit: Payer: Self-pay

## 2020-09-15 VITALS — BP 100/80 | HR 75 | Temp 97.8°F | Resp 18 | Ht 64.0 in | Wt 146.6 lb

## 2020-09-15 DIAGNOSIS — L299 Pruritus, unspecified: Secondary | ICD-10-CM | POA: Diagnosis not present

## 2020-09-15 DIAGNOSIS — K644 Residual hemorrhoidal skin tags: Secondary | ICD-10-CM

## 2020-09-15 DIAGNOSIS — K529 Noninfective gastroenteritis and colitis, unspecified: Secondary | ICD-10-CM

## 2020-09-15 DIAGNOSIS — N760 Acute vaginitis: Secondary | ICD-10-CM | POA: Insufficient documentation

## 2020-09-15 MED ORDER — FLUCONAZOLE 150 MG PO TABS: ORAL_TABLET | ORAL | 0 refills | Status: DC

## 2020-09-15 MED ORDER — HYDROCORTISONE (PERIANAL) 2.5 % EX CREA: 1.0000 "application " | TOPICAL_CREAM | Freq: Two times a day (BID) | CUTANEOUS | 0 refills | Status: DC

## 2020-09-15 NOTE — Patient Instructions (Addendum)
Diarrhea, Adult °Diarrhea is frequent loose and watery bowel movements. Diarrhea can make you feel weak and cause you to become dehydrated. Dehydration can make you tired and thirsty, cause you to have a dry mouth, and decrease how often you urinate. °Diarrhea typically lasts 2-3 days. However, it can last longer if it is a sign of something more serious. It is important to treat your diarrhea as told by your health care provider. °Follow these instructions at home: °Eating and drinking ° °  ° °Follow these recommendations as told by your health care provider: °· Take an oral rehydration solution (ORS). This is an over-the-counter medicine that helps return your body to its normal balance of nutrients and water. It is found at pharmacies and retail stores. °· Drink plenty of fluids, such as water, ice chips, diluted fruit juice, and low-calorie sports drinks. You can drink milk also, if desired. °· Avoid drinking fluids that contain a lot of sugar or caffeine, such as energy drinks, sports drinks, and soda. °· Eat bland, easy-to-digest foods in small amounts as you are able. These foods include bananas, applesauce, rice, lean meats, toast, and crackers. °· Avoid alcohol. °· Avoid spicy or fatty foods. ° °Medicines °· Take over-the-counter and prescription medicines only as told by your health care provider. °· If you were prescribed an antibiotic medicine, take it as told by your health care provider. Do not stop using the antibiotic even if you start to feel better. °General instructions ° °· Wash your hands often using soap and water. If soap and water are not available, use a hand sanitizer. Others in the household should wash their hands as well. Hands should be washed: °? After using the toilet or changing a diaper. °? Before preparing, cooking, or serving food. °? While caring for a sick person or while visiting someone in a hospital. °· Drink enough fluid to keep your urine pale yellow. °· Rest at home while  you recover. °· Watch your condition for any changes. °· Take a warm bath to relieve any burning or pain from frequent diarrhea episodes. °· Keep all follow-up visits as told by your health care provider. This is important. °Contact a health care provider if: °· You have a fever. °· Your diarrhea gets worse. °· You have new symptoms. °· You cannot keep fluids down. °· You feel light-headed or dizzy. °· You have a headache. °· You have muscle cramps. °Get help right away if: °· You have chest pain. °· You feel extremely weak or you faint. °· You have bloody or black stools or stools that look like tar. °· You have severe pain, cramping, or bloating in your abdomen. °· You have trouble breathing or you are breathing very quickly. °· Your heart is beating very quickly. °· Your skin feels cold and clammy. °· You feel confused. °· You have signs of dehydration, such as: °? Dark urine, very little urine, or no urine. °? Cracked lips. °? Dry mouth. °? Sunken eyes. °? Sleepiness. °? Weakness. °Summary °· Diarrhea is frequent loose and watery bowel movements. Diarrhea can make you feel weak and cause you to become dehydrated. °· Drink enough fluids to keep your urine pale yellow. °· Make sure that you wash your hands after using the toilet. If soap and water are not available, use hand sanitizer. °· Contact a health care provider if your diarrhea gets worse or you have new symptoms. °· Get help right away if you have signs of dehydration. °This   information is not intended to replace advice given to you by your health care provider. Make sure you discuss any questions you have with your health care provider. °Document Revised: 02/05/2019 Document Reviewed: 02/23/2018 °Elsevier Patient Education © 2020 [employer] ° °

## 2020-09-15 NOTE — Assessment & Plan Note (Signed)
Resolved Call or rto if symptoms return

## 2020-09-15 NOTE — Progress Notes (Signed)
Patient ID: Laura Hopkins, female    DOB: 03-Jul-1946  Age: 74 y.o. MRN: [id number]    Subjective:  Subjective  HPI Laura Hopkins presents for f/u er for colitis -- Laura Hopkins is feeling much better now and finished all the abx   Laura Hopkins now has a vaginal yeast infection from the abx and hemorrhoid from the diarrhea--- Laura Hopkins said the prep h helped but not completely   Review of Systems  Constitutional: Negative for appetite change, diaphoresis, fatigue and unexpected weight change.  Eyes: Negative for pain, redness and visual disturbance.  Respiratory: Negative for cough, chest tightness, shortness of breath and wheezing.   Cardiovascular: Negative for chest pain, palpitations and leg swelling.  Endocrine: Negative for cold intolerance, heat intolerance, polydipsia, polyphagia and polyuria.  Genitourinary: Negative for difficulty urinating, dysuria and frequency.  Neurological: Negative for dizziness, light-headedness, numbness and headaches.    History Past Medical History:  Diagnosis Date  . Acute pharyngitis 02/26/2009   Qualifier: Diagnosis of  By: Jerold Coombe    . Arthritis    hands and neck   . Essential hypertension 02/07/2007   Qualifier: Diagnosis of  By: Jerold Coombe    . Headache(784.0)   . Insomnia 12/23/2017  . Lower extremity edema 07/31/2017  . Migraine 05/23/2017  . SINUSITIS - ACUTE-NOS 03/17/2009   Qualifier: Diagnosis of  By: Jerold Coombe    . Skin cancer    basal cell  . 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 Amy Martinique   . URI 02/07/2007   Qualifier: Diagnosis of  By: Jerold Coombe      Laura Hopkins has a past surgical history that includes Abdominal hysterectomy (1995); Shoulder surgery (2000); Nasal septum surgery (1983); Colonoscopy (2007); Wisdom tooth extraction; Tubal ligation (1978); and Eye surgery (Bilateral, 03/03/2017).   Her family  history includes Arthritis in her father; Dementia in her mother; Diabetes in her father; Hypertension in her father and mother; Prostate cancer in her father; Skin cancer in her brother.Laura Hopkins reports that Laura Hopkins quit smoking about 21 years ago. Her smoking use included cigarettes. Laura Hopkins smoked 0.30 packs per day. Laura Hopkins has never used smokeless tobacco. Laura Hopkins reports that Laura Hopkins does not drink alcohol and does not use drugs.  Current Outpatient Medications on File Prior to Visit  Medication Sig Dispense Refill  . acetaminophen (TYLENOL) 500 MG tablet Take 500 mg by mouth every 6 (six) hours as needed.    Marland Kitchen albuterol (VENTOLIN HFA) 108 (90 Base) MCG/ACT inhaler Inhale 2 puffs into the lungs every 6 (six) hours as needed for wheezing or shortness of breath. 18 g 2  . ALPRAZolam (XANAX) 0.25 MG tablet Take 1 tablet by mouth 2 (two) times daily as needed.  1  . Apoaequorin (PREVAGEN PO) Take 1 tablet by mouth daily.    . bimatoprost (LATISSE) 0.03 % ophthalmic solution Place 1 drop into both eyes 2 (two) times a day.    . calcium carbonate (OS-CAL) 600 MG TABS tablet Take 1 tablet by mouth 2 (two) times daily with a meal.    . estradiol (VIVELLE-DOT) 0.05 MG/24HR patch Place 1 patch onto the skin once a week.    . Flaxseed, Linseed, (FLAX SEED OIL) 1000 MG CAPS Take 1 capsule by mouth daily.    . fluticasone (FLOVENT HFA) 110 MCG/ACT inhaler Inhale 2 puffs into the lungs  2 (two) times daily as needed. 1 Inhaler 6  . Glucosamine-Chondroitin (OSTEO BI-FLEX REGULAR STRENGTH PO) Take 1 tablet by mouth daily.     . naproxen sodium (ALEVE) 220 MG tablet Take 220 mg by mouth daily as needed.    . NONFORMULARY OR COMPOUNDED ITEM ashwagandha 300 mg  1 po qhs 30 each 1  . Omega-3 Fatty Acids (FISH OIL) 1000 MG CAPS Take 1 capsule by mouth daily.     . SUMAtriptan (IMITREX) 100 MG tablet TAKE 1 TABLET (100 MG TOTAL) BY MOUTH AS NEEDED.MAX ON INSURANCE 10 tablet 3  . temazepam (RESTORIL) 30 MG capsule Take 1 capsule (30 mg  total) by mouth at bedtime as needed for sleep. 30 capsule 0  . trimethobenzamide (TIGAN) 300 MG capsule Take 3 times daily as needed for nausea. 30 capsule 0  . valACYclovir (VALTREX) 1000 MG tablet Take 1 tablet (1,000 mg total) by mouth 3 (three) times daily. 30 tablet 1  . VITAMIN D PO Take 1,000 mg by mouth daily.    Marland Kitchen spironolactone (ALDACTONE) 25 MG tablet Take 1 tablet (25 mg total) by mouth daily. 90 tablet 3   No current facility-administered medications on file prior to visit.     Objective:  Objective  Physical Exam Vitals and nursing note reviewed.  Constitutional:      Appearance: Laura Hopkins is well-developed and well-nourished.  HENT:     Head: Normocephalic and atraumatic.  Eyes:     Extraocular Movements: EOM normal.     Conjunctiva/sclera: Conjunctivae normal.  Neck:     Thyroid: No thyromegaly.     Vascular: No carotid bruit or JVD.  Cardiovascular:     Rate and Rhythm: Normal rate and regular rhythm.     Heart sounds: Normal heart sounds. No murmur heard.   Pulmonary:     Effort: Pulmonary effort is normal. No respiratory distress.     Breath sounds: Normal breath sounds. No wheezing or rales.  Chest:     Chest wall: No tenderness.  Abdominal:     General: There is no distension.     Tenderness: There is no abdominal tenderness. There is no guarding or rebound.  Musculoskeletal:        General: No edema.     Cervical back: Normal range of motion and neck supple.  Neurological:     Mental Status: Laura Hopkins is alert and oriented to person, place, and time.  Psychiatric:        Mood and Affect: Mood and affect normal.    BP 100/80 (BP Location: Right Arm, Patient Position: Sitting, Cuff Size: Normal)   Pulse 75   Temp 97.8 F (36.6 C) (Oral)   Resp 18   Ht 5\' 4"  (1.626 m)   Wt 146 lb 9.6 oz (66.5 kg)   SpO2 97%   BMI 25.16 kg/m  Wt Readings from Last 3 Encounters:  09/15/20 146 lb 9.6 oz (66.5 kg)  09/01/20 147 lb 7.8 oz (66.9 kg)  07/02/20 147 lb 6.4 oz  (66.9 kg)     Lab Results  Component Value Date   WBC 8.4 09/01/2020   HGB 14.8 09/01/2020   HCT 43.6 09/01/2020   PLT 233 09/01/2020   GLUCOSE 119 (H) 09/01/2020   CHOL 191 07/02/2020   TRIG 115 07/02/2020   HDL 75 07/02/2020   LDLCALC 95 07/02/2020   ALT 10 09/01/2020   AST 26 09/01/2020   NA 134 (L) 09/01/2020   K 4.0 09/01/2020  CL 98 09/01/2020   CREATININE 0.79 09/01/2020   BUN 11 09/01/2020   CO2 27 09/01/2020   TSH 1.21 03/07/2011    CT ABDOMEN PELVIS W CONTRAST  Result Date: 09/01/2020 CLINICAL DATA:  Abdominal abscess or infection. EXAM: CT ABDOMEN AND PELVIS WITH CONTRAST TECHNIQUE: Multidetector CT imaging of the abdomen and pelvis was performed using the standard protocol following bolus administration of intravenous contrast. CONTRAST:  122mL OMNIPAQUE IOHEXOL 300 MG/ML  SOLN COMPARISON:  None. FINDINGS: Lower chest: No acute abnormality. Hepatobiliary: No focal liver abnormality is seen. No gallstones, gallbladder wall thickening, or biliary dilatation. Pancreas: Unremarkable. No pancreatic ductal dilatation or surrounding inflammatory changes. Spleen: Normal in size without focal abnormality. Adrenals/Urinary Tract: Adrenal glands are unremarkable. Kidneys are normal, without renal calculi, focal lesion, or hydronephrosis. Bladder is unremarkable. Stomach/Bowel: The stomach appears normal. The appendix appears normal. There is no evidence of bowel obstruction. Sigmoid diverticulosis is noted. There is wall thickening of the descending colon concerning for infectious or inflammatory colitis. Vascular/Lymphatic: No significant vascular findings are present. No enlarged abdominal or pelvic lymph nodes. Reproductive: Status post hysterectomy. No adnexal masses. Other: No abdominal wall hernia or abnormality. No abdominopelvic ascites. Musculoskeletal: No acute or significant osseous findings. IMPRESSION: 1. Wall thickening of the descending colon is noted concerning for  infectious or inflammatory colitis. 2. Sigmoid diverticulosis without inflammation. Electronically Signed   By: Marijo Conception M.D.   On: 09/01/2020 14:40     Assessment & Plan:  Plan  I have discontinued Leniyah Curington's promethazine, benzonatate, oseltamivir, ciprofloxacin, metroNIDAZOLE, dicyclomine, and ondansetron. I am also having her start on fluconazole and hydrocortisone. Additionally, I am having her maintain her estradiol, Glucosamine-Chondroitin (OSTEO BI-FLEX REGULAR STRENGTH PO), ALPRAZolam, Fish Oil, Flax Seed Oil, VITAMIN D PO, calcium carbonate, bimatoprost, Apoaequorin (PREVAGEN PO), acetaminophen, naproxen sodium, albuterol, Flovent HFA, valACYclovir, spironolactone, SUMAtriptan, NONFORMULARY OR COMPOUNDED ITEM, temazepam, and trimethobenzamide.  Meds ordered this encounter  Medications  . fluconazole (DIFLUCAN) 150 MG tablet    Sig: 1 po x1, may repeat in 3 days prn    Dispense:  2 tablet    Refill:  0  . hydrocortisone (PROCTO-MED HC) 2.5 % rectal cream    Sig: Place 1 application rectally 2 (two) times daily.    Dispense:  30 g    Refill:  0    Problem List Items Addressed This Visit      Unprioritized   Acute vaginitis - Primary    Diflucan x2 Call office if no better       Relevant Medications   fluconazole (DIFLUCAN) 150 MG tablet   Other Relevant Orders   CBC with Differential/Platelet   Comprehensive metabolic panel   Colitis    Resolved Call or rto if symptoms return       External hemorrhoid    rx sent in Call back if no relief Inc fiber ,  Probiotic       Relevant Medications   hydrocortisone (PROCTO-MED HC) 2.5 % rectal cream    Other Visit Diagnoses    Itching       Relevant Orders   CBC with Differential/Platelet   Comprehensive metabolic panel      Follow-up: Return in about 9 months (around 06/16/2021), or if symptoms worsen or fail to improve, for annual exam, fasting.  Ann Held, DO

## 2020-09-15 NOTE — Assessment & Plan Note (Signed)
rx sent in Call back if no relief Inc fiber ,  Probiotic

## 2020-09-15 NOTE — Assessment & Plan Note (Signed)
Diflucan x2 Call office if no better

## 2020-09-15 NOTE — Telephone Encounter (Signed)
Patient states she was sick  and had to cancel her appointment with Pharmacy and she would like r/s.

## 2020-09-15 NOTE — Progress Notes (Signed)
  Chronic Care Management   Outreach Note  09/15/2020 Name: Laura Hopkins MRN: [id number] DOB: 14-Mar-1946  Referred by: Ann Held, DO Reason for referral : No chief complaint on file.   A second unsuccessful telephone outreach was attempted today. The patient was referred to pharmacist for assistance with care management and care coordination.  Follow Up Plan:   Carley Perdue [employer] Scheduler

## 2020-09-16 LAB — CBC WITH DIFFERENTIAL/PLATELET
Basophils Absolute: 0.1 10*3/uL (ref 0.0–0.1)
Basophils Relative: 1.2 % (ref 0.0–3.0)
Eosinophils Absolute: 0.2 10*3/uL (ref 0.0–0.7)
Eosinophils Relative: 2 % (ref 0.0–5.0)
HCT: 41.1 % (ref 36.0–46.0)
Hemoglobin: 13.7 g/dL (ref 12.0–15.0)
Lymphocytes Relative: 19.8 % (ref 12.0–46.0)
Lymphs Abs: 1.6 10*3/uL (ref 0.7–4.0)
MCHC: 33.3 g/dL (ref 30.0–36.0)
MCV: 95.4 fl (ref 78.0–100.0)
Monocytes Absolute: 0.7 10*3/uL (ref 0.1–1.0)
Monocytes Relative: 9.1 % (ref 3.0–12.0)
Neutro Abs: 5.5 10*3/uL (ref 1.4–7.7)
Neutrophils Relative %: 67.9 % (ref 43.0–77.0)
Platelets: 311 10*3/uL (ref 150.0–400.0)
RBC: 4.31 Mil/uL (ref 3.87–5.11)
RDW: 13.9 % (ref 11.5–15.5)
WBC: 8.1 10*3/uL (ref 4.0–10.5)

## 2020-09-16 LAB — COMPREHENSIVE METABOLIC PANEL
ALT: 7 U/L (ref 0–35)
AST: 19 U/L (ref 0–37)
Albumin: 4.1 g/dL (ref 3.5–5.2)
Alkaline Phosphatase: 53 U/L (ref 39–117)
BUN: 14 mg/dL (ref 6–23)
CO2: 28 mEq/L (ref 19–32)
Calcium: 9.7 mg/dL (ref 8.4–10.5)
Chloride: 100 mEq/L (ref 96–112)
Creatinine, Ser: 0.86 mg/dL (ref 0.40–1.20)
GFR: 67.69 mL/min (ref >60.00)
Glucose, Bld: 79 mg/dL (ref 70–99)
Potassium: 4.4 mEq/L (ref 3.5–5.1)
Sodium: 136 mEq/L (ref 135–145)
Total Bilirubin: 0.6 mg/dL (ref 0.2–1.2)
Total Protein: 6.4 g/dL (ref 6.0–8.3)

## 2020-09-21 ENCOUNTER — Telehealth: Payer: Self-pay

## 2020-09-21 NOTE — Telephone Encounter (Signed)
PA denied. Medication is not being used for treatment of rhinitis, antiemetic therapy in postop patients, motion sickenss, allergic conjunctivitis, dermatographism, allergic reaction to blood or plasma, sedation, adjunct therapy w/ analgesics for postop pain, angioedema, adjunct therapy w/ epinephrine for anaphylaxis after acute symptoms.

## 2020-09-21 NOTE — Telephone Encounter (Signed)
PA initiated via Covermymeds; KEY: [id number]. Awaiting determination.

## 2020-09-23 ENCOUNTER — Ambulatory Visit: Payer: Medicare HMO | Admitting: Family Medicine

## 2020-09-25 DIAGNOSIS — G4733 Obstructive sleep apnea (adult) (pediatric): Secondary | ICD-10-CM | POA: Diagnosis not present

## 2020-09-29 ENCOUNTER — Ambulatory Visit: Payer: Medicare HMO | Admitting: Family Medicine

## 2020-09-29 ENCOUNTER — Encounter: Payer: Self-pay | Admitting: Family Medicine

## 2020-09-29 VITALS — BP 125/84 | HR 63 | Ht 64.0 in | Wt 145.0 lb

## 2020-09-29 DIAGNOSIS — F5104 Psychophysiologic insomnia: Secondary | ICD-10-CM | POA: Diagnosis not present

## 2020-09-29 DIAGNOSIS — Z9989 Dependence on other enabling machines and devices: Secondary | ICD-10-CM

## 2020-09-29 DIAGNOSIS — G4733 Obstructive sleep apnea (adult) (pediatric): Secondary | ICD-10-CM | POA: Diagnosis not present

## 2020-09-29 DIAGNOSIS — R69 Illness, unspecified: Secondary | ICD-10-CM | POA: Diagnosis not present
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
## 2020-09-29 NOTE — Patient Instructions (Addendum)
Please continue using your CPAP regularly. While your insurance requires that you use CPAP at least 4 hours each night on 70% of the nights, I recommend, that you not skip any nights and use it throughout the night if you can. Getting used to CPAP and staying with the treatment long term does take time and patience and discipline. Untreated obstructive sleep apnea when it is moderate to severe can have an adverse impact on cardiovascular health and raise her risk for heart disease, arrhythmias, hypertension, congestive heart failure, stroke and diabetes. Untreated obstructive sleep apnea causes sleep disruption, nonrestorative sleep, and sleep deprivation. This can have an impact on your day to day functioning and cause daytime sleepiness and impairment of cognitive function, memory loss, mood disturbance, and problems focussing. Using CPAP regularly can improve these symptoms.   Follow up in 6 months   Sleep Apnea Sleep apnea affects breathing during sleep. It causes breathing to stop for a short time or to become shallow. It can also increase the risk of:  Heart attack.  Stroke.  Being very overweight (obese).  Diabetes.  Heart failure.  Irregular heartbeat. The goal of treatment is to help you breathe normally again. What are the causes? There are three kinds of sleep apnea:  Obstructive sleep apnea. This is caused by a blocked or collapsed airway.  Central sleep apnea. This happens when the brain does not send the right signals to the muscles that control breathing.  Mixed sleep apnea. This is a combination of obstructive and central sleep apnea. The most common cause of this condition is a collapsed or blocked airway. This can happen if:  Your throat muscles are too relaxed.  Your tongue and tonsils are too large.  You are overweight.  Your airway is too small. What increases the risk?  Being overweight.  Smoking.  Having a small airway.  Being older.  Being  female.  Drinking alcohol.  Taking medicines to calm yourself (sedatives or tranquilizers).  Having family members with the condition. What are the signs or symptoms?  Trouble staying asleep.  Being sleepy or tired during the day.  Getting angry a lot.  Loud snoring.  Headaches in the morning.  Not being able to focus your mind (concentrate).  Forgetting things.  Less interest in sex.  Mood swings.  Personality changes.  Feelings of sadness (depression).  Waking up a lot during the night to pee (urinate).  Dry mouth.  Sore throat. How is this diagnosed?  Your medical history.  A physical exam.  A test that is done when you are sleeping (sleep study). The test is most often done in a sleep lab but may also be done at home. How is this treated?   Sleeping on your side.  Using a medicine to get rid of mucus in your nose (decongestant).  Avoiding the use of alcohol, medicines to help you relax, or certain pain medicines (narcotics).  Losing weight, if needed.  Changing your diet.  Not smoking.  Using a machine to open your airway while you sleep, such as: ? An oral appliance. This is a mouthpiece that shifts your lower jaw forward. ? A CPAP device. This device blows air through a mask when you breathe out (exhale). ? An EPAP device. This has valves that you put in each nostril. ? A BPAP device. This device blows air through a mask when you breathe in (inhale) and breathe out.  Having surgery if other treatments do not work. It is  important to get treatment for sleep apnea. Without treatment, it can lead to:  High blood pressure.  Coronary artery disease.  In men, not being able to have an erection (impotence).  Reduced thinking ability. Follow these instructions at home: Lifestyle  Make changes that your doctor recommends.  Eat a healthy diet.  Lose weight if needed.  Avoid alcohol, medicines to help you relax, and some pain  medicines.  Do not use any products that contain nicotine or tobacco, such as cigarettes, e-cigarettes, and chewing tobacco. If you need help quitting, ask your doctor. General instructions  Take over-the-counter and prescription medicines only as told by your doctor.  If you were given a machine to use while you sleep, use it only as told by your doctor.  If you are having surgery, make sure to tell your doctor you have sleep apnea. You may need to bring your device with you.  Keep all follow-up visits as told by your doctor. This is important. Contact a doctor if:  The machine that you were given to use during sleep bothers you or does not seem to be working.  You do not get better.  You get worse. Get help right away if:  Your chest hurts.  You have trouble breathing in enough air.  You have an uncomfortable feeling in your back, arms, or stomach.  You have trouble talking.  One side of your body feels weak.  A part of your face is hanging down. These symptoms may be an emergency. Do not wait to see if the symptoms will go away. Get medical help right away. Call your local emergency services (911 in the [place] Do not drive yourself to the hospital. Summary  This condition affects breathing during sleep.  The most common cause is a collapsed or blocked airway.  The goal of treatment is to help you breathe normally while you sleep. This information is not intended to replace advice given to you by your health care provider. Make sure you discuss any questions you have with your health care provider. Document Revised: 07/06/2018 Document Reviewed: 05/15/2018 Elsevier Patient Education  2020 [employer].   Quality Sleep Information, Adult Quality sleep is important for your mental and physical health. It also improves your quality of life. Quality sleep means you:  Are asleep for most of the time you are in bed.  Fall asleep within 30 minutes.  Wake up no more  than once a night.  Are awake for no longer than 20 minutes if you do wake up during the night. Most adults need 7-8 hours of quality sleep each night. How can poor sleep affect me? If you do not get enough quality sleep, you may have:  Mood swings.  Daytime sleepiness.  Confusion.  Decreased reaction time.  Sleep disorders, such as insomnia and sleep apnea.  Difficulty with: ? Solving problems. ? Coping with stress. ? Paying attention. These issues may affect your performance and productivity at work, school, and at home. Lack of sleep may also put you at higher risk for accidents, suicide, and risky behaviors. If you do not get quality sleep you may also be at higher risk for several health problems, including:  Infections.  Type 2 diabetes.  Heart disease.  High blood pressure.  Obesity.  Worsening of long-term conditions, like arthritis, kidney disease, depression, Parkinson's disease, and epilepsy. What actions can I take to get more quality sleep?      Stick to a sleep schedule. Go  to sleep and wake up at about the same time each day. Do not try to sleep less on weekdays and make up for lost sleep on weekends. This does not work.  Try to get about 30 minutes of exercise on most days. Do not exercise 2-3 hours before going to bed.  Limit naps during the day to 30 minutes or less.  Do not use any products that contain nicotine or tobacco, such as cigarettes or e-cigarettes. If you need help quitting, ask your health care provider.  Do not drink caffeinated beverages for at least 8 hours before going to bed. Coffee, tea, and some sodas contain caffeine.  Do not drink alcohol close to bedtime.  Do not eat large meals close to bedtime.  Do not take naps in the late afternoon.  Try to get at least 30 minutes of sunlight every day. Morning sunlight is best.  Make time to relax before bed. Reading, listening to music, or taking a hot bath promotes quality  sleep.  Make your bedroom a place that promotes quality sleep. Keep your bedroom dark, quiet, and at a comfortable room temperature. Make sure your bed is comfortable. Take out sleep distractions like TV, a computer, smartphone, and bright lights.  If you are lying awake in bed for longer than 20 minutes, get up and do a relaxing activity until you feel sleepy.  Work with your health care provider to treat medical conditions that may affect sleeping, such as: ? Nasal obstruction. ? Snoring. ? Sleep apnea and other sleep disorders.  Talk to your health care provider if you think any of your prescription medicines may cause you to have difficulty falling or staying asleep.  If you have sleep problems, talk with a sleep consultant. If you think you have a sleep disorder, talk with your health care provider about getting evaluated by a specialist. Where to find more information  [place] website: https://sleepfoundation.org  National Heart, Lung, and [place] ([place]): http://www.saunders.info/.pdf  Centers for Disease Control and Prevention (CDC): LearningDermatology.pl Contact a health care provider if you:  Have trouble getting to sleep or staying asleep.  Often wake up very early in the morning and cannot get back to sleep.  Have daytime sleepiness.  Have daytime sleep attacks of suddenly falling asleep and sudden muscle weakness (narcolepsy).  Have a tingling sensation in your legs with a strong urge to move your legs (restless legs syndrome).  Stop breathing briefly during sleep (sleep apnea).  Think you have a sleep disorder or are taking a medicine that is affecting your quality of sleep. Summary  Most adults need 7-8 hours of quality sleep each night.  Getting enough quality sleep is an important part of health and well-being.  Make your bedroom a place that promotes quality sleep and avoid things that may cause  you to have poor sleep, such as alcohol, caffeine, smoking, and large meals.  Talk to your health care provider if you have trouble falling asleep or staying asleep. This information is not intended to replace advice given to you by your health care provider. Make sure you discuss any questions you have with your health care provider. Document Revised: 12/27/2017 Document Reviewed: 12/27/2017 Elsevier Patient Education  [place].   Insomnia Insomnia is a sleep disorder that makes it difficult to fall asleep or stay asleep. Insomnia can cause fatigue, low energy, difficulty concentrating, mood swings, and poor performance at work or school. There are three different ways to  classify insomnia:  Difficulty falling asleep.  Difficulty staying asleep.  Waking up too early in the morning. Any type of insomnia can be long-term (chronic) or short-term (acute). Both are common. Short-term insomnia usually lasts for three months or less. Chronic insomnia occurs at least three times a week for longer than three months. What are the causes? Insomnia may be caused by another condition, situation, or substance, such as:  Anxiety.  Certain medicines.  Gastroesophageal reflux disease (GERD) or other gastrointestinal conditions.  Asthma or other breathing conditions.  Restless legs syndrome, sleep apnea, or other sleep disorders.  Chronic pain.  Menopause.  Stroke.  Abuse of alcohol, tobacco, or illegal drugs.  Mental health conditions, such as depression.  Caffeine.  Neurological disorders, such as Alzheimer's disease.  An overactive thyroid (hyperthyroidism). Sometimes, the cause of insomnia may not be known. What increases the risk? Risk factors for insomnia include:  Gender. Women are affected more often than men.  Age. Insomnia is more common as you get older.  Stress.  Lack of exercise.  Irregular work schedule or working night shifts.  Traveling between  different time zones.  Certain medical and mental health conditions. What are the signs or symptoms? If you have insomnia, the main symptom is having trouble falling asleep or having trouble staying asleep. This may lead to other symptoms, such as:  Feeling fatigued or having low energy.  Feeling nervous about going to sleep.  Not feeling rested in the morning.  Having trouble concentrating.  Feeling irritable, anxious, or depressed. How is this diagnosed? This condition may be diagnosed based on:  Your symptoms and medical history. Your health care provider may ask about: ? Your sleep habits. ? Any medical conditions you have. ? Your mental health.  A physical exam. How is this treated? Treatment for insomnia depends on the cause. Treatment may focus on treating an underlying condition that is causing insomnia. Treatment may also include:  Medicines to help you sleep.  Counseling or therapy.  Lifestyle adjustments to help you sleep better. Follow these instructions at home: Eating and drinking   Limit or avoid alcohol, caffeinated beverages, and cigarettes, especially close to bedtime. These can disrupt your sleep.  Do not eat a large meal or eat spicy foods right before bedtime. This can lead to digestive discomfort that can make it hard for you to sleep. Sleep habits   Keep a sleep diary to help you and your health care provider figure out what could be causing your insomnia. Write down: ? When you sleep. ? When you wake up during the night. ? How well you sleep. ? How rested you feel the next day. ? Any side effects of medicines you are taking. ? What you eat and drink.  Make your bedroom a dark, comfortable place where it is easy to fall asleep. ? Put up shades or blackout curtains to block light from outside. ? Use a white noise machine to block noise. ? Keep the temperature cool.  Limit screen use before bedtime. This includes: ? Watching TV. ? Using  your smartphone, tablet, or computer.  Stick to a routine that includes going to bed and waking up at the same times every day and night. This can help you fall asleep faster. Consider making a quiet activity, such as reading, part of your nighttime routine.  Try to avoid taking naps during the day so that you sleep better at night.  Get out of bed if you are still awake  after 15 minutes of trying to sleep. Keep the lights down, but try reading or doing a quiet activity. When you feel sleepy, go back to bed. General instructions  Take over-the-counter and prescription medicines only as told by your health care provider.  Exercise regularly, as told by your health care provider. Avoid exercise starting several hours before bedtime.  Use relaxation techniques to manage stress. Ask your health care provider to suggest some techniques that may work well for you. These may include: ? Breathing exercises. ? Routines to release muscle tension. ? Visualizing peaceful scenes.  Make sure that you drive carefully. Avoid driving if you feel very sleepy.  Keep all follow-up visits as told by your health care provider. This is important. Contact a health care provider if:  You are tired throughout the day.  You have trouble in your daily routine due to sleepiness.  You continue to have sleep problems, or your sleep problems get worse. Get help right away if:  You have serious thoughts about hurting yourself or someone else. If you ever feel like you may hurt yourself or others, or have thoughts about taking your own life, get help right away. You can go to your nearest emergency department or call:  Your local emergency services (911 in the [place]  A suicide crisis helpline, such as the [place] at 952-108-4962. This is open 24 hours a day. Summary  Insomnia is a sleep disorder that makes it difficult to fall asleep or stay asleep.  Insomnia can be long-term  (chronic) or short-term (acute).  Treatment for insomnia depends on the cause. Treatment may focus on treating an underlying condition that is causing insomnia.  Keep a sleep diary to help you and your health care provider figure out what could be causing your insomnia. This information is not intended to replace advice given to you by your health care provider. Make sure you discuss any questions you have with your health care provider. Document Revised: 09/01/2017 Document Reviewed: 06/29/2017 Elsevier Patient Education  2020 [employer].

## 2020-10-01 ENCOUNTER — Ambulatory Visit: Payer: Medicare HMO | Admitting: Pharmacist

## 2020-10-01 ENCOUNTER — Other Ambulatory Visit: Payer: Self-pay

## 2020-10-01 ENCOUNTER — Ambulatory Visit: Payer: Medicare HMO

## 2020-10-01 VITALS — BP 128/86 | HR 69 | Wt 147.4 lb

## 2020-10-01 DIAGNOSIS — G43509 Persistent migraine aura without cerebral infarction, not intractable, without status migrainosus: Secondary | ICD-10-CM

## 2020-10-01 DIAGNOSIS — I10 Essential (primary) hypertension: Secondary | ICD-10-CM

## 2020-10-01 DIAGNOSIS — E785 Hyperlipidemia, unspecified: Secondary | ICD-10-CM

## 2020-10-01 NOTE — Chronic Care Management (AMB) (Signed)
Chronic Care Management Pharmacy  Name: Laura Hopkins  MRN: [id number] DOB: Oct 11, 1945  Chief Complaint/ HPI  Laura Hopkins,  74 y.o. , female presents for their Initial CCM visit with the clinical pharmacist via telephone.  PCP : Ann Held, DO  Their chronic conditions include: Hypertension, Hyperlipidemia, Migraine, Insomnia, Estrogen Deficiency, Osteopenia  Office Visits: 09/15/20: Visit w/ Dr. Etter Sjogren - F/U post colitis. Now with vaginal yeast infection. Prescribed diflucan x2. For hemorrhoids prescribed hydrocortisone 2.5% rectal cream. RTC 9 months  09/02/20: Visit w/ Dr. Nani Ravens -  ED followup for Colitis prescribed trimethobenzamide and dicyclomine. Continue flagyl and cipro. RTC in about 5 days if no improvement.   08/31/20: Visit w/ Mackie Pai, PA-C - Myalgia, cough, nausea, sore throat. Grand daughter positive for flu. Flu test at urgent care negative, but symptoms concerning. Prescribed tamiflu, zofran, azithromycin (suspected pharyngitis), and benzonatate.   07/02/20: Visit w/ Dr. Etter Sjogren - Concern for cost of aimovig. No med changes noted.   Consult Visit: 09/29/20: Neuro visit w/ Debbora Presto, NP - Pt did not tolerate switch to clonazepam at previous visit. No med changes noted. RTC 6 months.   08/03/20: [facility] w/ Dr. Nelva Bush   ED Visit:  09/01/20: [facility] - Influenza A and Colitis. Continue tamiflu and start cipro and flagyl. D/C azithromycin   Medications: Outpatient Encounter Medications as of 10/01/2020  Medication Sig  . acetaminophen (TYLENOL) 500 MG tablet Take 500 mg by mouth every 6 (six) hours as needed.  Marland Kitchen albuterol (VENTOLIN HFA) 108 (90 Base) MCG/ACT inhaler Inhale 2 puffs into the lungs every 6 (six) hours as needed for wheezing or shortness of breath.  . Apoaequorin (PREVAGEN PO) Take 1 tablet by mouth daily.  Marland Kitchen estradiol (VIVELLE-DOT) 0.05 MG/24HR patch Place 1 patch onto the skin once a week.  . fluticasone (FLOVENT HFA)  110 MCG/ACT inhaler Inhale 2 puffs into the lungs 2 (two) times daily as needed.  . Glucosamine-Chondroitin (OSTEO BI-FLEX REGULAR STRENGTH PO) Take 1 tablet by mouth daily.   . naproxen sodium (ALEVE) 220 MG tablet Take 220 mg by mouth daily as needed.  Marland Kitchen spironolactone (ALDACTONE) 25 MG tablet Take 1 tablet (25 mg total) by mouth daily.  . SUMAtriptan (IMITREX) 100 MG tablet TAKE 1 TABLET (100 MG TOTAL) BY MOUTH AS NEEDED.MAX ON INSURANCE  . temazepam (RESTORIL) 30 MG capsule Take 1 capsule (30 mg total) by mouth at bedtime as needed for sleep.  . [DISCONTINUED] bimatoprost (LATISSE) 0.03 % ophthalmic solution Place 1 drop into both eyes 2 (two) times a Nazly Digilio.   No facility-administered encounter medications on file as of 10/01/2020.   SDOH Screenings   Alcohol Screen: Not on file  Depression (PHQ2-9): Low Risk   . PHQ-2 Score: 0  Financial Resource Strain: Medium Risk  . Difficulty of Paying Living Expenses: Somewhat hard  Food Insecurity: Not on file  Housing: Not on file  Physical Activity: Not on file  Social Connections: Not on file  Stress: Not on file  Tobacco Use: Medium Risk  . Smoking Tobacco Use: Former Smoker  . Smokeless Tobacco Use: Never Used  Transportation Needs: Not on file     Current Diagnosis/Assessment:  Goals Addressed            This Visit's Progress   . Chronic Care Management Pharmacy Care Plan       CARE PLAN ENTRY (see longitudinal plan of care for additional care plan information)  Current Barriers:  .  Chronic Disease Management support, education, and care coordination needs related to Hypertension, Hyperlipidemia, Migraine, Insomnia, Estrogen Deficiency, Osteopenia   Hypertension BP Readings from Last 3 Encounters:  10/01/20 128/86  09/29/20 125/84  09/15/20 100/80   . Pharmacist Clinical Goal(s): o Over the next 180 days, patient will work with PharmD and providers to maintain BP goal <140/90 . Current regimen:  o Spironolactone  76m daily . Interventions: o Discussed BP goal . Patient self care activities - Over the next 180 days, patient will: o Maintain hypertension medication regimen.   Hyperlipidemia Lab Results  Component Value Date/Time   LDLCALC 95 07/02/2020 11:39 AM   . Pharmacist Clinical Goal(s): o Over the next 180 days, patient will work with PharmD and providers to maintain LDL goal < 100 . Current regimen:  o Diet and exercise management   . Interventions: o Discussed LDL goal . Patient self care activities - Over the next 180 days, patient will: o Maintain LDL less than 100  Osteopenia . Pharmacist Clinical Goal(s) o Over the next 90 days, patient will work with PharmD and providers to reduce risk of fracture due to osteopenia . Current regimen:  o None . Interventions: o Discussed intake of 1204mof calcium daily through diet and/or supplementation o Discussed intake of [phone] units of vitamin D through supplementation  . Patient self care activities - Over the next 90 days, patient will: o Consider intake of 120086mf calcium daily through diet and/or supplementation o Consider intake of [phone] units of vitamin D through supplementation  Migraine . Pharmacist Clinical Goal(s) o Over the next 90 days, patient will work with PharmD and providers to reduce symptoms associated with migraines . Current regimen:  . Sumatriptan 100m70m needed   Acetaminophen 500mg59maproxen 220mg 43mnterventions: o Discussed aimovig patient assistance application . Patient self care activities - Over the next 90 days, patient will: o Consider completing aimovig patient assistance application  Medication management . Pharmacist Clinical Goal(s): o Over the next 180 days, patient will work with PharmD and providers to maintain optimal medication adherence . [facility] . Interventions o Comprehensive medication review performed. o Continue current medication management  strategy . Patient self care activities - Over the next 180 days, patient will: o Focus on medication adherence by filling and taking medications appropriately  o Take medications as prescribed o Report any questions or concerns to PharmD and/or provider(s)  Initial goal documentation        Hypertension   BP goal is:  <140/90  Office blood pressures are  BP Readings from Last 3 Encounters:  10/01/20 128/86  09/29/20 125/84  09/15/20 100/80   Patient checks BP at home infrequently Patient home BP readings are ranging: Unable to assess  Patient has failed these meds in the past: metoprolol (metallic taste), carvedilol (fatigue), amlodipine (swelling), irbesartan (cough), chlorthalidone (listed in D/C meds. No apparent reason for D/C) Patient is currently controlled on the following medications:  . Spironolactone 25mg d76m (during lunch)  We discussed BP goal   Denies headaches outside of migraine, chest pain, dizziness  Plan -Continue current medications     Hyperlipidemia   LDL goal < 100  Last lipids Lab Results  Component Value Date   CHOL 191 07/02/2020   HDL 75 07/02/2020   LDLCALC 95 07/02/2020   TRIG 115 07/02/2020   CHOLHDL 2.5 07/02/2020   Hepatic Function Latest Ref Rng & Units 09/15/2020 09/01/2020 07/02/2020  Total Protein 6.0 -  8.3 g/dL 6.4 6.9 6.8  Albumin 3.5 - 5.2 g/dL 4.1 4.0 -  AST 0 - 37 U/L '19 26 19  ' ALT 0 - 35 U/L '7 10 7  ' Alk Phosphatase 39 - 117 U/L 53 51 -  Total Bilirubin 0.2 - 1.2 mg/dL 0.6 0.5 0.7  Bilirubin, Direct 0.0 - 0.3 mg/dL - - -     The 10-year ASCVD risk score Mikey Bussing DC Jr., et al., 2013) is: 11.2%   Values used to calculate the score:     Age: 64 years     Sex: Female     Is Non-Hispanic African American: No     Diabetic: No     Tobacco smoker: No     Systolic Blood Pressure: 010 mmHg     Is BP treated: No     HDL Cholesterol: 75 mg/dL     Total Cholesterol: 191 mg/dL   Patient has failed these meds in past:  None noted  Patient is currently controlled on the following medications:  . None  Intermediate risk w/o diagnosis of DM.  Patient/Provider discussion regarding statin therapy.   We discussed:  LDL goal  Plan -Continue control with diet and exercise  Migraine    Patient has failed these meds in past: aimovig (cost) Patient is currently controlled on the following medications: . Sumatriptan 191m as needed   Acetaminophen 501mas needed  She is sensitive to changes in bariometric pressure Feels the migraines may have improved over the year HA range from 2 to 6-8 She requested aimovig, but ran into the donut hole in September. She got down to none or one headache a month while on aimovig. Patient is interested in exploring patient assistance, but she is unsure if she will qualify due to her income possibly being higher than the threshold.  Provided patient with paperwork to complete. If she qualifies after consultation with her husband she can return the paperwork to the office for completion and submission to patient assistance foundation ([employer], community  Plan -Continue current medications  -Consider completing Aimovig patient assistance application  Insomnia    Patient has failed these meds in past: clonazepam (inefficacy), trazodone (grogginess), sonata (migraines) Patient is currently controlled on the following medications: . Temazepam 3012maily as needed for sleep  A 30 Amyr Sluder supply lasts her 3-4 months.  Does not use nightly  Plan -Continue current medications  Shortness of Breath/Obstructive Sleep Apnea    Patient has failed these meds in past: None noted  Patient is currently controlled on the following medications: . Albuterol HFA 2 puffs every 6 hours as needed . Flovent 110m56m puffs twice daily as needed   How often flovent? Not in last 6 months How often albuterol? Not in last 6 months What causes SOB? Nothing of note  Uses CPAP  Plan -Continue  current medications   Estrogen Deficiency    Patient has failed these meds in past: None noted  Patient is currently controlled on the following medications:  Estradiol 0.05mg92mr patch once a week (Saturday)  This helps with with hot flashes  She plans to discuss possibility of D/C estradiol when she sees Gyn in February.  Plan -Continue current medications   Osteopenia   Last DEXA Scan: 07/31/12 (although pt reports she has had once since per gyn)  T-Score femoral neck: -1.4 (L)   VITD  Date Value Ref Range Status  12/26/2019 52.94 30.00 - 100.00 ng/mL Final     Patient has failed these meds  in past: None noted  Patient is currently controlled on the following medications:  . None  Getting a DEXA Scan in February Has had shoulder surgery Sees Dr. Runell Gess and she does the bone density with mammo   We discussed:  Recommend [phone] units of vitamin D daily. Recommend 1200 mg of calcium daily from dietary and supplemental sources.  Plan -Consider intake of 1272m of calcium daily through diet and/or supplementation -Consider intake of [phone] units of vitamin D through supplementation   Vaccines/Health Maintenance    Reviewed and discussed patient's vaccination history.   Patient is up to date on all vaccines.  Immunization History  Administered Date(s) Administered  . Fluad Quad(high Dose 65+) 07/04/2019, 07/02/2020  . Influenza Split 07/24/2017  . Influenza Whole 07/03/2006, 09/11/2007, 07/15/2008, 07/30/2009  . Influenza, High Dose Seasonal PF 08/27/2014, 08/06/2015, 07/24/2017  . Influenza,inj,Quad PF,6+ Mos 08/12/2013, 07/20/2018  . PFIZER SARS-COV-2 Vaccination 10/24/2019, 11/14/2019, 07/14/2020  . Pneumococcal Conjugate-13 10/02/2015  . Pneumococcal Polysaccharide-23 03/27/2014  . Td 07/02/2020  . Zoster Recombinat (Shingrix) 06/18/2018, 08/19/2018   Last mammogram 11/21/18 (no evidence of malignancy) Last colonoscopy 04/12/2016   Medication  Management   [facility]  [id number] [place] [id number]: [phone]: [address] [address]hone: [phone]: [phone] Meds Naproxen 22017m uses as needed for neck/shoulder pain   Plan  Continue current medication management strategy   Follow up: 6 month phone visit  KanDe BlanchharmD, BCACP Clinical Pharmacist [phone]

## 2020-10-01 NOTE — Patient Instructions (Addendum)
Visit Information  Goals Addressed            This Visit's Progress   . Chronic Care Management Pharmacy Care Plan       CARE PLAN ENTRY (see longitudinal plan of care for additional care plan information)  Current Barriers:  . Chronic Disease Management support, education, and care coordination needs related to Hypertension, Hyperlipidemia, Migraine, Insomnia, Estrogen Deficiency, Osteopenia   Hypertension BP Readings from Last 3 Encounters:  10/01/20 128/86  09/29/20 125/84  09/15/20 100/80   . Pharmacist Clinical Goal(s): o Over the next 180 days, patient will work with PharmD and providers to maintain BP goal <140/90 . Current regimen:  o Spironolactone 82m daily . Interventions: o Discussed BP goal . Patient self care activities - Over the next 180 days, patient will: o Maintain hypertension medication regimen.   Hyperlipidemia Lab Results  Component Value Date/Time   LDLCALC 95 07/02/2020 11:39 AM   . Pharmacist Clinical Goal(s): o Over the next 180 days, patient will work with PharmD and providers to maintain LDL goal < 100 . Current regimen:  o Diet and exercise management   . Interventions: o Discussed LDL goal . Patient self care activities - Over the next 180 days, patient will: o Maintain LDL less than 100  Osteopenia . Pharmacist Clinical Goal(s) o Over the next 90 days, patient will work with PharmD and providers to reduce risk of fracture due to osteopenia . Current regimen:  o None . Interventions: o Discussed intake of 12066mof calcium daily through diet and/or supplementation o Discussed intake of [phone] units of vitamin D through supplementation  . Patient self care activities - Over the next 90 days, patient will: o Consider intake of 120078mf calcium daily through diet and/or supplementation o Consider intake of [phone] units of vitamin D through supplementation  Migraine . Pharmacist Clinical Goal(s) o Over the next 90 days,  patient will work with PharmD and providers to reduce symptoms associated with migraines . Current regimen:  . Sumatriptan 100m98m needed   Acetaminophen 500mg33maproxen 220mg 38mnterventions: o Discussed aimovig patient assistance application . Patient self care activities - Over the next 90 days, patient will: o Consider completing aimovig patient assistance application  Medication management . Pharmacist Clinical Goal(s): o Over the next 180 days, patient will work with PharmD and providers to maintain optimal medication adherence . [facility] . Interventions o Comprehensive medication review performed. o Continue current medication management strategy . Patient self care activities - Over the next 180 days, patient will: o Focus on medication adherence by filling and taking medications appropriately  o Take medications as prescribed o Report any questions or concerns to PharmD and/or provider(s)  Initial goal documentation        Ms. Loper Raganiven information about Chronic Care Management services today including:  1. CCM service includes personalized support from designated clinical staff supervised by her physician, including individualized plan of care and coordination with other care providers 2. 24/7 contact phone numbers for assistance for urgent and routine care needs. 3. Standard insurance, coinsurance, copays and deductibles apply for chronic care management only during months in which we provide at least 20 minutes of these services. Most insurances cover these services at 100%, however patients may be responsible for any copay, coinsurance and/or deductible if applicable. This service may help you avoid the need for more expensive face-to-face services. 4. Only one practitioner may furnish and bill the  service in a calendar month. 5. The patient may stop CCM services at any time (effective at the end of the month) by phone call to the office  staff.  Patient agreed to services and verbal consent obtained.   The patient verbalized understanding of instructions, educational materials, and care plan provided today and agreed to receive a mailed copy of patient instructions, educational materials, and care plan.  Telephone follow up appointment with pharmacy team member scheduled for: 04/02/2021  Melvenia Beam Almeta Geisel, [facility]    Healthy Eating Following a healthy eating pattern may help you to achieve and maintain a healthy body weight, reduce the risk of chronic disease, and live a long and productive life. It is important to follow a healthy eating pattern at an appropriate calorie level for your body. Your nutritional needs should be met primarily through food by choosing a variety of nutrient-rich foods. What are tips for following this plan? Reading food labels Read labels and choose the following: Reduced or low sodium. Juices with 100% fruit juice. Foods with low saturated fats and high polyunsaturated and monounsaturated fats. Foods with whole grains, such as whole wheat, cracked wheat, brown rice, and wild rice. Whole grains that are fortified with folic acid. This is recommended for women who are pregnant or who want to become pregnant. Read labels and avoid the following: Foods with a lot of added sugars. These include foods that contain brown sugar, corn sweetener, corn syrup, dextrose, fructose, glucose, high-fructose corn syrup, honey, invert sugar, lactose, malt syrup, maltose, molasses, raw sugar, sucrose, trehalose, or turbinado sugar. Do not eat more than the following amounts of added sugar per Arnoldo Hildreth: 6 teaspoons (25 g) for women. 9 teaspoons (38 g) for men. Foods that contain processed or refined starches and grains. Refined grain products, such as white flour, degermed cornmeal, white bread, and white rice. Shopping Choose nutrient-rich snacks, such as vegetables, whole fruits, and nuts. Avoid high-calorie and high-sugar  snacks, such as potato chips, fruit snacks, and candy. Use oil-based dressings and spreads on foods instead of solid fats such as butter, stick margarine, or cream cheese. Limit pre-made sauces, mixes, and "instant" products such as flavored rice, instant noodles, and ready-made pasta. Try more plant-protein sources, such as tofu, tempeh, black beans, edamame, lentils, nuts, and seeds. Explore eating plans such as the Mediterranean diet or vegetarian diet. Cooking Use oil to saut or stir-fry foods instead of solid fats such as butter, stick margarine, or lard. Try baking, boiling, grilling, or broiling instead of frying. Remove the fatty part of meats before cooking. Steam vegetables in water or broth. Meal planning  At meals, imagine dividing your plate into fourths: One-half of your plate is fruits and vegetables. One-fourth of your plate is whole grains. One-fourth of your plate is protein, especially lean meats, poultry, eggs, tofu, beans, or nuts. Include low-fat dairy as part of your daily diet. Lifestyle Choose healthy options in all settings, including home, work, school, restaurants, or stores. Prepare your food safely: Wash your hands after handling raw meats. Keep food preparation surfaces clean by regularly washing with hot, soapy water. Keep raw meats separate from ready-to-eat foods, such as fruits and vegetables. Cook seafood, meat, poultry, and eggs to the recommended internal temperature. Store foods at safe temperatures. In general: Keep cold foods at 17F (4.4C) or below. Keep hot foods at 117F (60C) or above. Keep your freezer at [facility] (-17.8C) or below. Foods are no longer safe to eat when they have been between the  temperatures of 40-140F (4.4-60C) for more than 2 hours. What foods should I eat? Fruits Aim to eat 2 cup-equivalents of fresh, canned (in natural juice), or frozen fruits each Odean Fester. Examples of 1 cup-equivalent of fruit include 1 small apple, 8  large strawberries, 1 cup canned fruit,  cup dried fruit, or 1 cup 100% juice. Vegetables Aim to eat 2-3 cup-equivalents of fresh and frozen vegetables each Hershey Knauer, including different varieties and colors. Examples of 1 cup-equivalent of vegetables include 2 medium carrots, 2 cups raw, leafy greens, 1 cup chopped vegetable (raw or cooked), or 1 medium baked potato. Grains Aim to eat 6 ounce-equivalents of whole grains each Shaul Trautman. Examples of 1 ounce-equivalent of grains include 1 slice of bread, 1 cup ready-to-eat cereal, 3 cups popcorn, or  cup cooked rice, pasta, or cereal. Meats and other proteins Aim to eat 5-6 ounce-equivalents of protein each Sevastian Witczak. Examples of 1 ounce-equivalent of protein include 1 egg, 1/2 cup nuts or seeds, or 1 tablespoon (16 g) peanut butter. A cut of meat or fish that is the size of a deck of cards is about 3-4 ounce-equivalents. Of the protein you eat each week, try to have at least 8 ounces come from seafood. This includes salmon, trout, herring, and anchovies. Dairy Aim to eat 3 cup-equivalents of fat-free or low-fat dairy each  Cowher. Examples of 1 cup-equivalent of dairy include 1 cup (240 mL) milk, 8 ounces (250 g) yogurt, 1 ounces (44 g) natural cheese, or 1 cup (240 mL) fortified soy milk. Fats and oils Aim for about 5 teaspoons (21 g) per Giavonni Cizek. Choose monounsaturated fats, such as canola and olive oils, avocados, peanut butter, and most nuts, or polyunsaturated fats, such as sunflower, corn, and soybean oils, walnuts, pine nuts, sesame seeds, sunflower seeds, and flaxseed. Beverages Aim for six 8-oz glasses of water per Makenley Shimp. Limit coffee to three to five 8-oz cups per Cheronda Erck. Limit caffeinated beverages that have added calories, such as soda and energy drinks. Limit alcohol intake to no more than 1 drink a Iram Lundberg for nonpregnant women and 2 drinks a Aydeen Blume for men. One drink equals 12 oz of beer (355 mL), 5 oz of wine (148 mL), or 1 oz of hard liquor (44 mL). Seasoning and  other foods Avoid adding excess amounts of salt to your foods. Try flavoring foods with herbs and spices instead of salt. Avoid adding sugar to foods. Try using oil-based dressings, sauces, and spreads instead of solid fats. This information is based on general [place] nutrition guidelines. For more information, visit Build[employer].es. Exact amounts may vary based on your nutrition needs. Summary A healthy eating plan may help you to maintain a healthy weight, reduce the risk of chronic diseases, and stay active throughout your life. Plan your meals. Make sure you eat the right portions of a variety of nutrient-rich foods. Try baking, boiling, grilling, or broiling instead of frying. Choose healthy options in all settings, including home, work, school, restaurants, or stores. This information is not intended to replace advice given to you by your health care provider. Make sure you discuss any questions you have with your health care provider. Document Revised: 01/01/2018 Document Reviewed: 01/01/2018 Elsevier Patient Education  [place].

## 2020-10-14 ENCOUNTER — Encounter: Payer: Self-pay | Admitting: Family Medicine

## 2020-10-15 ENCOUNTER — Other Ambulatory Visit: Payer: Self-pay | Admitting: Family Medicine

## 2020-10-15 ENCOUNTER — Other Ambulatory Visit: Payer: Self-pay

## 2020-10-15 ENCOUNTER — Ambulatory Visit (INDEPENDENT_AMBULATORY_CARE_PROVIDER_SITE_OTHER): Payer: Medicare HMO | Admitting: Family Medicine

## 2020-10-15 ENCOUNTER — Other Ambulatory Visit (HOSPITAL_COMMUNITY)
Admission: RE | Admit: 2020-10-15 | Discharge: 2020-10-15 | Disposition: A | Discharge status: Home / Self Care | Payer: Medicare HMO | Source: Ambulatory Visit | Attending: Family Medicine | Admitting: Family Medicine

## 2020-10-15 ENCOUNTER — Encounter: Payer: Self-pay | Admitting: Family Medicine

## 2020-10-15 VITALS — BP 124/88 | HR 76 | Temp 97.8°F | Resp 18 | Ht 64.0 in | Wt 149.4 lb

## 2020-10-15 DIAGNOSIS — N898 Other specified noninflammatory disorders of vagina: Secondary | ICD-10-CM

## 2020-10-15 DIAGNOSIS — B373 Candidiasis of vulva and vagina: Secondary | ICD-10-CM | POA: Insufficient documentation

## 2020-10-15 LAB — POC URINALSYSI DIPSTICK (AUTOMATED)
Bilirubin, UA: NEGATIVE
Blood, UA: NEGATIVE
Glucose, UA: NEGATIVE
Ketones, UA: NEGATIVE
Leukocytes, UA: NEGATIVE
Nitrite, UA: NEGATIVE
Protein, UA: NEGATIVE
Spec Grav, UA: 1.01 (ref 1.010–1.025)
Urobilinogen, UA: 0.2 E.U./dL
pH, UA: 6 (ref 5.0–8.0)

## 2020-10-15 MED ORDER — FLUCONAZOLE 150 MG PO TABS: ORAL_TABLET | ORAL | 0 refills | Status: DC

## 2020-10-15 MED FILL — FLUCONAZOLE 150 MG TABS: 150 | 3 days supply | Qty: 2 | Fill #0

## 2020-10-15 NOTE — Patient Instructions (Signed)
Vaginal Yeast Infection, Adult  Vaginal yeast infection is a condition that causes vaginal discharge as well as soreness, swelling, and redness (inflammation) of the vagina. This is a common condition. Some women get this infection frequently. What are the causes? This condition is caused by a change in the normal balance of the yeast (candida) and bacteria that live in the vagina. This change causes an overgrowth of yeast, which causes the inflammation. What increases the risk? The condition is more likely to develop in women who:  Take antibiotic medicines.  Have diabetes.  Take birth control pills.  Are pregnant.  Douche often.  Have a weak body defense system (immune system).  Have been taking steroid medicines for a long time.  Frequently wear tight clothing. What are the signs or symptoms? Symptoms of this condition include:  White, thick, creamy vaginal discharge.  Swelling, itching, redness, and irritation of the vagina. The lips of the vagina (vulva) may be affected as well.  Pain or a burning feeling while urinating.  Pain during sex. How is this diagnosed? This condition is diagnosed based on:  Your medical history.  A physical exam.  A pelvic exam. Your health care provider will examine a sample of your vaginal discharge under a microscope. Your health care provider may send this sample for testing to confirm the diagnosis. How is this treated? This condition is treated with medicine. Medicines may be over-the-counter or prescription. You may be told to use one or more of the following:  Medicine that is taken by mouth (orally).  Medicine that is applied as a cream (topically).  Medicine that is inserted directly into the vagina (suppository). Follow these instructions at home: Lifestyle  Do not have sex until your health care provider approves. Tell your sex partner that you have a yeast infection. That person should go to his or her health care  provider and ask if they should also be treated.  Do not wear tight clothes, such as pantyhose or tight pants.  Wear breathable cotton underwear. General instructions  Take or apply over-the-counter and prescription medicines only as told by your health care provider.  Eat more yogurt. This may help to keep your yeast infection from returning.  Do not use tampons until your health care provider approves.  Try taking a sitz bath to help with discomfort. This is a warm water bath that is taken while you are sitting down. The water should only come up to your hips and should cover your buttocks. Do this 3-4 times per day or as told by your health care provider.  Do not douche.  If you have diabetes, keep your blood sugar levels under control.  Keep all follow-up visits as told by your health care provider. This is important.   Contact a health care provider if:  You have a fever.  Your symptoms go away and then return.  Your symptoms do not get better with treatment.  Your symptoms get worse.  You have new symptoms.  You develop blisters in or around your vagina.  You have blood coming from your vagina and it is not your menstrual period.  You develop pain in your abdomen. Summary  Vaginal yeast infection is a condition that causes discharge as well as soreness, swelling, and redness (inflammation) of the vagina.  This condition is treated with medicine. Medicines may be over-the-counter or prescription.  Take or apply over-the-counter and prescription medicines only as told by your health care provider.  Do not   douche. Do not have sex or use tampons until your health care provider approves.  Contact a health care provider if your symptoms do not get better with treatment or your symptoms go away and then return. This information is not intended to replace advice given to you by your health care provider. Make sure you discuss any questions you have with your health care  provider. Document Revised: 04/19/2019 Document Reviewed: 02/05/2018 Elsevier Patient Education  2021 [employer]vier Inc.  

## 2020-10-15 NOTE — Progress Notes (Signed)
Patient ID: Laura Hopkins, female    DOB: 07/22/1946  Age: 75 y.o. MRN: [id number]    Subjective:  Subjective  HPI Laura Hopkins presents for vaginal itching and burning x 4 days    She tried otc meds with little relief.    Review of Systems  Constitutional: Negative for appetite change, diaphoresis, fatigue and unexpected weight change.  Eyes: Negative for pain, redness and visual disturbance.  Respiratory: Negative for cough, chest tightness, shortness of breath and wheezing.   Cardiovascular: Negative for chest pain, palpitations and leg swelling.  Endocrine: Negative for cold intolerance, heat intolerance, polydipsia, polyphagia and polyuria.  Genitourinary: Positive for vaginal discharge and vaginal pain. Negative for difficulty urinating, dysuria and frequency.  Neurological: Negative for dizziness, light-headedness, numbness and headaches.    History Past Medical History:  Diagnosis Date  . Acute pharyngitis 02/26/2009   Qualifier: Diagnosis of  By: Jerold Coombe    . Arthritis    hands and neck   . Essential hypertension 02/07/2007   Qualifier: Diagnosis of  By: Jerold Coombe    . Headache(784.0)   . Insomnia 12/23/2017  . Lower extremity edema 07/31/2017  . Migraine 05/23/2017  . SINUSITIS - ACUTE-NOS 03/17/2009   Qualifier: Diagnosis of  By: Jerold Coombe    . Skin cancer    basal cell  . 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 Amy Martinique   . URI 02/07/2007   Qualifier: Diagnosis of  By: Jerold Coombe      She has a past surgical history that includes Abdominal hysterectomy (1995); Shoulder surgery (2000); Nasal septum surgery (1983); Colonoscopy (2007); Wisdom tooth extraction; Tubal ligation (1978); and Eye surgery (Bilateral, 03/03/2017).   Her family history includes Arthritis in her father; Dementia in her mother; Diabetes in her  father; Hypertension in her father and mother; Prostate cancer in her father; Skin cancer in her brother.She reports that she quit smoking about 21 years ago. Her smoking use included cigarettes. She smoked 0.30 packs per day. She has never used smokeless tobacco. She reports that she does not drink alcohol and does not use drugs.  Current Outpatient Medications on File Prior to Visit  Medication Sig Dispense Refill  . acetaminophen (TYLENOL) 500 MG tablet Take 500 mg by mouth every 6 (six) hours as needed.    Marland Kitchen albuterol (VENTOLIN HFA) 108 (90 Base) MCG/ACT inhaler Inhale 2 puffs into the lungs every 6 (six) hours as needed for wheezing or shortness of breath. 18 g 2  . Apoaequorin (PREVAGEN PO) Take 1 tablet by mouth daily.    Marland Kitchen estradiol (VIVELLE-DOT) 0.05 MG/24HR patch Place 1 patch onto the skin once a week.    . fluticasone (FLOVENT HFA) 110 MCG/ACT inhaler Inhale 2 puffs into the lungs 2 (two) times daily as needed. 1 Inhaler 6  . Glucosamine-Chondroitin (OSTEO BI-FLEX REGULAR STRENGTH PO) Take 1 tablet by mouth daily.     . naproxen sodium (ALEVE) 220 MG tablet Take 220 mg by mouth daily as needed.    . SUMAtriptan (IMITREX) 100 MG tablet TAKE 1 TABLET (100 MG TOTAL) BY MOUTH AS NEEDED.MAX ON INSURANCE 10 tablet 3  . temazepam (RESTORIL) 30 MG capsule Take 1 capsule (30 mg total) by mouth at bedtime as needed for sleep. 30 capsule 0  . spironolactone (ALDACTONE) 25 MG tablet Take 1  tablet (25 mg total) by mouth daily. 90 tablet 3   No current facility-administered medications on file prior to visit.     Objective:  Objective  Physical Exam Vitals and nursing note reviewed.  Constitutional:      Appearance: She is well-developed and well-nourished.  HENT:     Head: Normocephalic and atraumatic.  Eyes:     Extraocular Movements: EOM normal.     Conjunctiva/sclera: Conjunctivae normal.  Neck:     Thyroid: No thyromegaly.     Vascular: No carotid bruit or JVD.  Cardiovascular:      Rate and Rhythm: Normal rate and regular rhythm.     Heart sounds: Normal heart sounds. No murmur heard.   Pulmonary:     Effort: Pulmonary effort is normal. No respiratory distress.     Breath sounds: Normal breath sounds. No wheezing or rales.  Chest:     Chest wall: No tenderness.  Genitourinary:    Vagina: Vaginal discharge present.     Comments: Cervical swab done and sent for testing  Musculoskeletal:        General: No edema.     Cervical back: Normal range of motion and neck supple.  Neurological:     Mental Status: She is alert and oriented to person, place, and time.  Psychiatric:        Mood and Affect: Mood and affect normal.    BP 124/88 (BP Location: Right Arm, Patient Position: Sitting, Cuff Size: Normal)   Pulse 76   Temp 97.8 F (36.6 C) (Oral)   Resp 18   Ht 5\' 4"  (1.626 m)   Wt 149 lb 6.4 oz (67.8 kg)   SpO2 97%   BMI 25.64 kg/m  Wt Readings from Last 3 Encounters:  10/15/20 149 lb 6.4 oz (67.8 kg)  10/01/20 147 lb 6.4 oz (66.9 kg)  09/29/20 145 lb (65.8 kg)     Lab Results  Component Value Date   WBC 8.1 09/15/2020   HGB 13.7 09/15/2020   HCT 41.1 09/15/2020   PLT 311.0 09/15/2020   GLUCOSE 79 09/15/2020   CHOL 191 07/02/2020   TRIG 115 07/02/2020   HDL 75 07/02/2020   LDLCALC 95 07/02/2020   ALT 7 09/15/2020   AST 19 09/15/2020   NA 136 09/15/2020   K 4.4 09/15/2020   CL 100 09/15/2020   CREATININE 0.86 09/15/2020   BUN 14 09/15/2020   CO2 28 09/15/2020   TSH 1.21 03/07/2011    No results found.   Assessment & Plan:  Plan  I am having Laura Hopkins start on fluconazole. I am also having her maintain her estradiol, Glucosamine-Chondroitin (OSTEO BI-FLEX REGULAR STRENGTH PO), Apoaequorin (PREVAGEN PO), acetaminophen, naproxen sodium, albuterol, Flovent HFA, spironolactone, SUMAtriptan, and temazepam.  Meds ordered this encounter  Medications  . fluconazole (DIFLUCAN) 150 MG tablet    Sig: 1 po x1, may repeat in 3 days prn     Dispense:  2 tablet    Refill:  0    Problem List Items Addressed This Visit   None   Visit Diagnoses    Vaginal itching    -  Primary   Relevant Medications   fluconazole (DIFLUCAN) 150 MG tablet   Other Relevant Orders   POCT Urinalysis Dipstick (Automated) (Completed)   Cervicovaginal ancillary only( [facility]) (Completed)      Follow-up: Return if symptoms worsen or fail to improve.  Ann Held, DO

## 2020-10-16 DIAGNOSIS — Z85828 Personal history of other malignant neoplasm of skin: Secondary | ICD-10-CM | POA: Diagnosis not present

## 2020-10-16 DIAGNOSIS — D2272 Melanocytic nevi of left lower limb, including hip: Secondary | ICD-10-CM | POA: Diagnosis not present

## 2020-10-16 DIAGNOSIS — D225 Melanocytic nevi of trunk: Secondary | ICD-10-CM | POA: Diagnosis not present

## 2020-10-16 DIAGNOSIS — L821 Other seborrheic keratosis: Secondary | ICD-10-CM | POA: Diagnosis not present

## 2020-10-16 DIAGNOSIS — L570 Actinic keratosis: Secondary | ICD-10-CM | POA: Diagnosis not present

## 2020-10-16 LAB — CERVICOVAGINAL ANCILLARY ONLY
Bacterial Vaginitis (gardnerella): NEGATIVE
Candida Glabrata: NEGATIVE
Candida Vaginitis: POSITIVE — AB
Comment: NEGATIVE
Comment: NEGATIVE
Comment: NEGATIVE

## 2020-11-08 DIAGNOSIS — G4733 Obstructive sleep apnea (adult) (pediatric): Secondary | ICD-10-CM | POA: Diagnosis not present

## 2020-11-25 ENCOUNTER — Other Ambulatory Visit: Payer: Self-pay | Admitting: Family Medicine

## 2020-11-25 DIAGNOSIS — G4700 Insomnia, unspecified: Secondary | ICD-10-CM

## 2020-11-25 DIAGNOSIS — G43809 Other migraine, not intractable, without status migrainosus: Secondary | ICD-10-CM

## 2020-11-25 NOTE — Telephone Encounter (Signed)
Requesting: temazepam 30mg  Contract: 02/12/2018 UDS: 02/12/2018 Last Visit: 10/15/2020 Next Visit: None Last Refill: 08/14/2020 #30 and 0RF  Please Advise

## 2020-11-26 DIAGNOSIS — Z1272 Encounter for screening for malignant neoplasm of vagina: Secondary | ICD-10-CM | POA: Diagnosis not present

## 2020-11-26 DIAGNOSIS — Z6825 Body mass index (BMI) 25.0-25.9, adult: Secondary | ICD-10-CM | POA: Diagnosis not present

## 2020-11-26 DIAGNOSIS — N760 Acute vaginitis: Secondary | ICD-10-CM | POA: Diagnosis not present

## 2020-11-26 DIAGNOSIS — N958 Other specified menopausal and perimenopausal disorders: Secondary | ICD-10-CM | POA: Diagnosis not present

## 2020-11-26 DIAGNOSIS — Z90710 Acquired absence of both cervix and uterus: Secondary | ICD-10-CM | POA: Diagnosis not present

## 2020-11-26 DIAGNOSIS — Z124 Encounter for screening for malignant neoplasm of cervix: Secondary | ICD-10-CM | POA: Diagnosis not present

## 2020-12-06 DIAGNOSIS — G4733 Obstructive sleep apnea (adult) (pediatric): Secondary | ICD-10-CM | POA: Diagnosis not present

## 2020-12-10 ENCOUNTER — Telehealth: Payer: Self-pay | Admitting: Pharmacist

## 2020-12-10 NOTE — Progress Notes (Addendum)
° ° °  Chronic Care Management Pharmacy Assistant   Name: Laura Hopkins  MRN: [id number] DOB: 06-Jul-1946  Reason for Encounter: General Disease State Call   Conditions to be addressed/monitored:  Hypertension, Hyperlipidemia, Migraine, Insomnia, Estrogen Deficiency, Osteopenia  Recent office visits:  10/15/20 Ann Held, DO. For Vaginal itching. STARTED Fluconazole 150 mg prn.  Recent consult visits: None since 09/21/20  Hospital visits: None since 09/21/20   Medications: Outpatient Encounter Medications as of 12/10/2020  Medication Sig   acetaminophen (TYLENOL) 500 MG tablet Take 500 mg by mouth every 6 (six) hours as needed.   albuterol (VENTOLIN HFA) 108 (90 Base) MCG/ACT inhaler Inhale 2 puffs into the lungs every 6 (six) hours as needed for wheezing or shortness of breath.   Apoaequorin (PREVAGEN PO) Take 1 tablet by mouth daily.   estradiol (VIVELLE-DOT) 0.05 MG/24HR patch Place 1 patch onto the skin once a week.   fluconazole (DIFLUCAN) 150 MG tablet 1 po x1, may repeat in 3 days prn   fluticasone (FLOVENT HFA) 110 MCG/ACT inhaler Inhale 2 puffs into the lungs 2 (two) times daily as needed.   Glucosamine-Chondroitin (OSTEO BI-FLEX REGULAR STRENGTH PO) Take 1 tablet by mouth daily.    naproxen sodium (ALEVE) 220 MG tablet Take 220 mg by mouth daily as needed.   spironolactone (ALDACTONE) 25 MG tablet Take 1 tablet (25 mg total) by mouth daily.   SUMAtriptan (IMITREX) 100 MG tablet Take 1 tablet (100 mg total) by mouth daily as needed for migraine.   temazepam (RESTORIL) 30 MG capsule TAKE 1 CAPSULE BY MOUTH AT BEDTIME AS NEEDED FOR SLEEP.   No facility-administered encounter medications on file as of 12/10/2020.   GEN CALL: Patient stated everything is going fine. She stated she walks for about 30 min to 1 hour usually five days out of the week. Patient stated sometimes she eats good and some days she doesn't. She stated she eats vegetables and fruit. She stated she eats  mostly grilled meat. She stated she eats two to three meals a day. Patient stated she doesn't have any questions or concerns about her medication at this time.  Star Rating Drugs: None  Follow-Up: Pharmacist Review  Charlann Lange, RMA Clinical Pharmacist Assistant [phone]   9 minutes spent in review, coordination, and documentation.  Reviewed by: Beverly Milch, PharmD Clinical Pharmacist [facility] [phone]

## 2020-12-16 DIAGNOSIS — L570 Actinic keratosis: Secondary | ICD-10-CM | POA: Diagnosis not present

## 2020-12-21 DIAGNOSIS — H04123 Dry eye syndrome of bilateral lacrimal glands: Secondary | ICD-10-CM | POA: Diagnosis not present

## 2020-12-21 DIAGNOSIS — H43813 Vitreous degeneration, bilateral: Secondary | ICD-10-CM | POA: Diagnosis not present

## 2020-12-21 DIAGNOSIS — Z961 Presence of intraocular lens: Secondary | ICD-10-CM | POA: Diagnosis not present

## 2020-12-21 DIAGNOSIS — H26493 Other secondary cataract, bilateral: Secondary | ICD-10-CM | POA: Diagnosis not present

## 2020-12-21 DIAGNOSIS — H5201 Hypermetropia, right eye: Secondary | ICD-10-CM | POA: Diagnosis not present

## 2020-12-21 DIAGNOSIS — H5202 Hypermetropia, left eye: Secondary | ICD-10-CM | POA: Diagnosis not present

## 2020-12-31 ENCOUNTER — Telehealth: Payer: Self-pay | Admitting: Pharmacist

## 2020-12-31 NOTE — Progress Notes (Addendum)
    Chronic Care Management  Pharmacy Assistant   Name: Laura Hopkins  MRN: [id number] DOB: 13-Nov-1945  Reason for Encounter: Adherence Review  I reviewed the patients chart for any medical/health changes and/or medication changes there were not any changes at this time.   Verified Adherence Gap Information. Per insurance data their most recent blood pressure was 100/60 on 07/02/20. The patients total gaps-all measures is equal to 1.  Follow-Up:Pharmacist Review  Charlann Lange, [facility] Pharmacist Assistant [phone]

## 2021-01-06 DIAGNOSIS — G4733 Obstructive sleep apnea (adult) (pediatric): Secondary | ICD-10-CM | POA: Diagnosis not present

## 2021-01-07 DIAGNOSIS — Z1231 Encounter for screening mammogram for malignant neoplasm of breast: Secondary | ICD-10-CM | POA: Diagnosis not present

## 2021-01-07 LAB — HM MAMMOGRAPHY

## 2021-01-25 ENCOUNTER — Other Ambulatory Visit: Payer: Self-pay | Admitting: Family Medicine

## 2021-01-25 DIAGNOSIS — I10 Essential (primary) hypertension: Secondary | ICD-10-CM

## 2021-02-05 DIAGNOSIS — H04123 Dry eye syndrome of bilateral lacrimal glands: Secondary | ICD-10-CM | POA: Diagnosis not present

## 2021-02-05 DIAGNOSIS — H11112 Conjunctival deposits, left eye: Secondary | ICD-10-CM | POA: Diagnosis not present

## 2021-02-16 ENCOUNTER — Other Ambulatory Visit: Payer: Self-pay | Admitting: Family Medicine

## 2021-02-16 DIAGNOSIS — G4700 Insomnia, unspecified: Secondary | ICD-10-CM

## 2021-02-16 NOTE — Telephone Encounter (Signed)
Requesting: temazepam 30mg   Contract:02/12/2018 UDS:02/12/2018 Last Visit: 10/15/2020 Next Visit: None Last Refill: 11/26/2020 #30 and 0RF Pt sig: 1 capsule qhs prn  Please Advise

## 2021-03-07 IMAGING — CT CT ABD-PELV W/ CM
2 of 5 series · 16 of 46 positions shown, 18 images · IV contrast (Omnipaque)
Comparison: None.

CLINICAL DATA: Abdominal abscess or infection.

EXAM:
CT ABDOMEN AND PELVIS WITH CONTRAST
TECHNIQUE: Multidetector CT imaging of the abdomen and pelvis was performed
using the standard protocol following bolus administration of
intravenous contrast.
CONTRAST:  100mL OMNIPAQUE IOHEXOL 300 MG/ML  SOLN

[Series 2: axial st · axial · 0.74mm/px · z∈[-451,-56]mm · 13 of 89 slices shown, 15 images]
[im 5/89  soft-tissue]
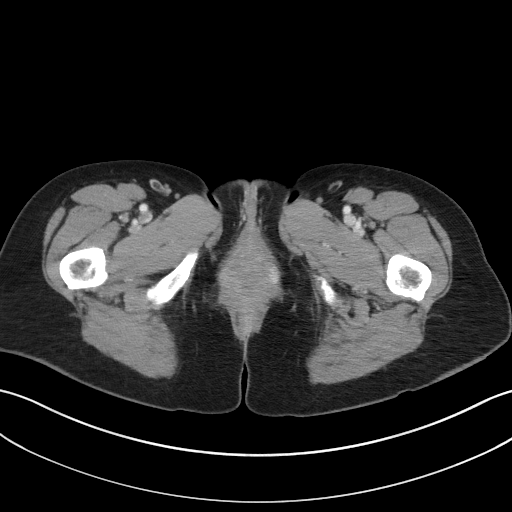
[im 5/89  bone]
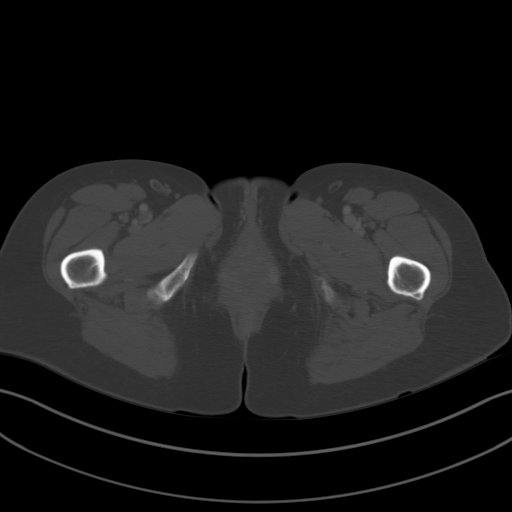
[im 10/89  soft-tissue]
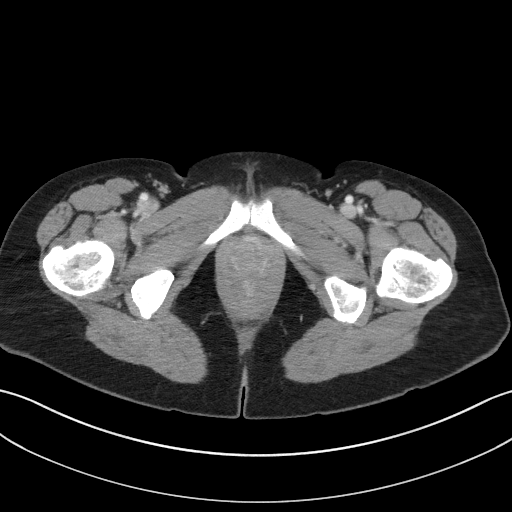
[im 20/89  soft-tissue]
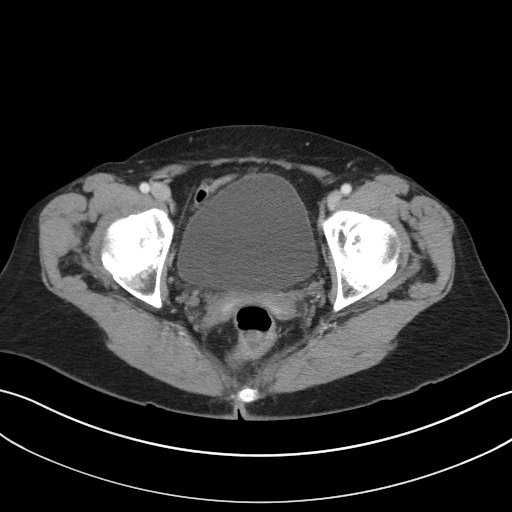
[im 25/89  soft-tissue]
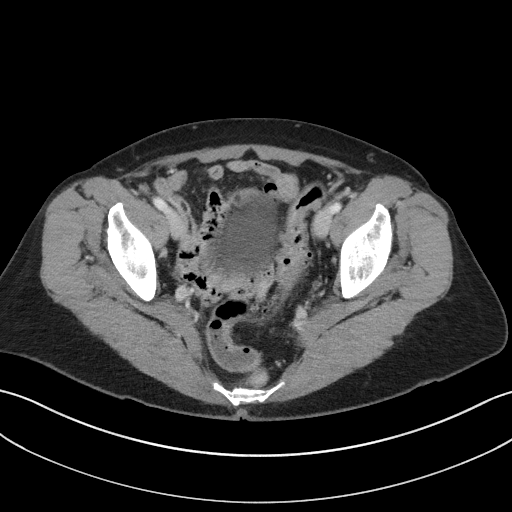
[im 30/89  soft-tissue]
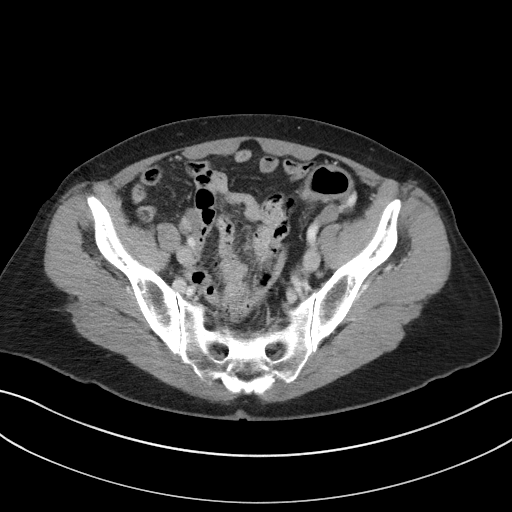
[im 40/89  soft-tissue]
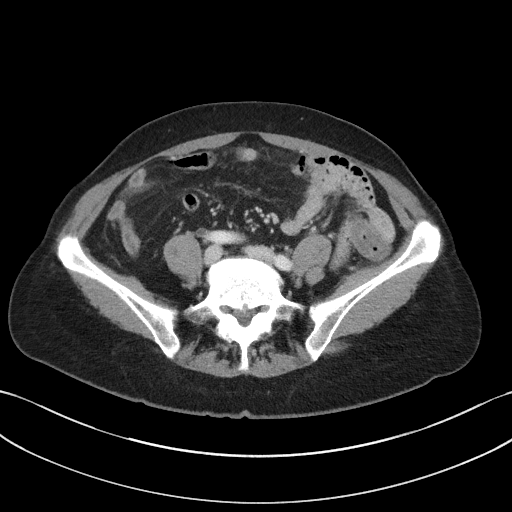
[im 45/89  soft-tissue]
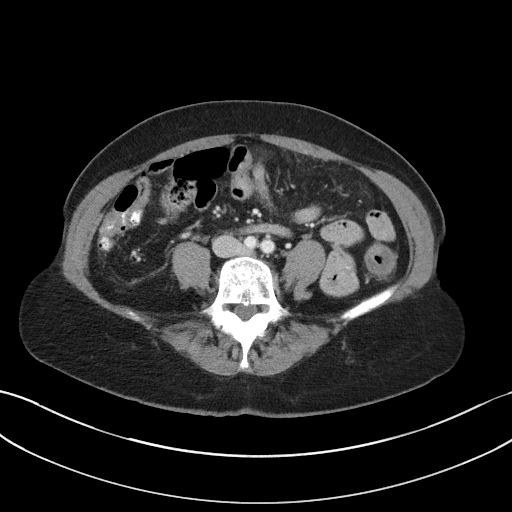
[im 49/89  soft-tissue]
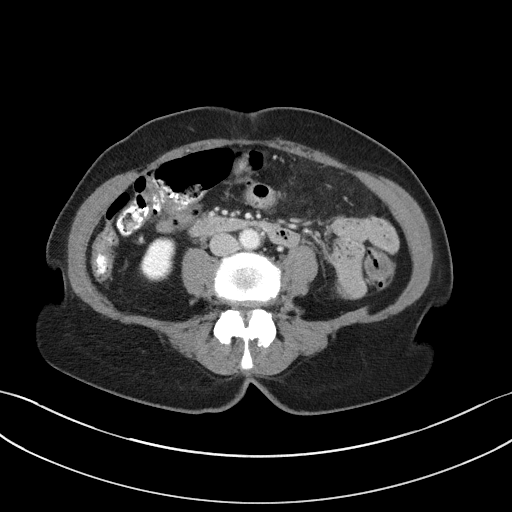
[im 59/89  soft-tissue]
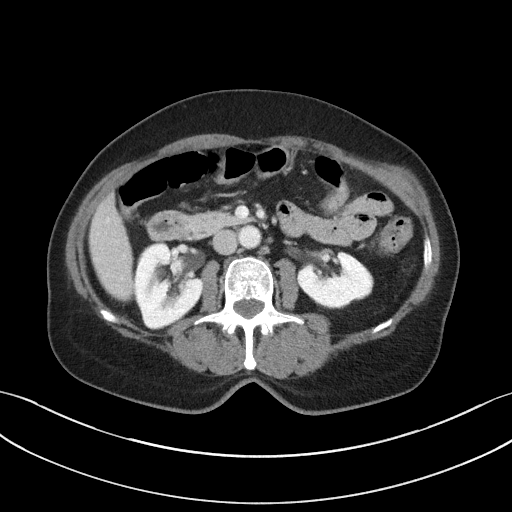
[im 59/89  bone]
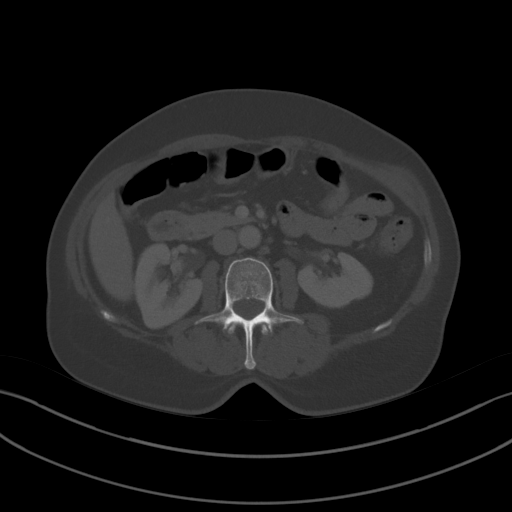
[im 64/89  soft-tissue]
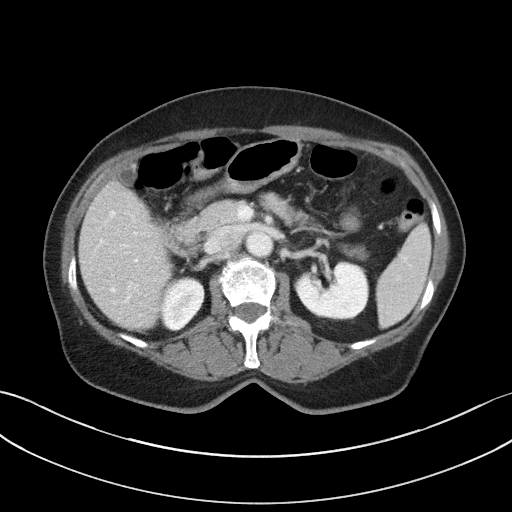
[im 69/89  soft-tissue]
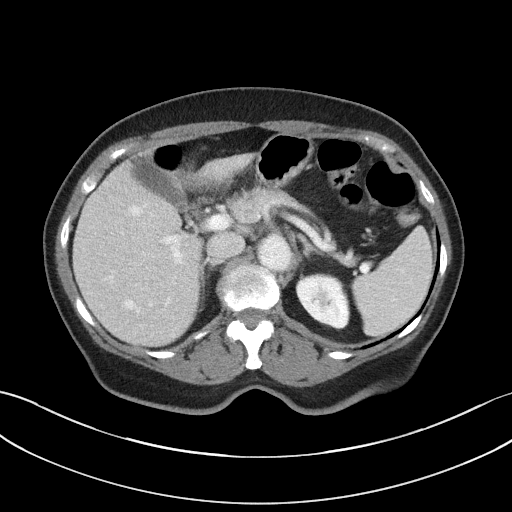
[im 79/89  soft-tissue]
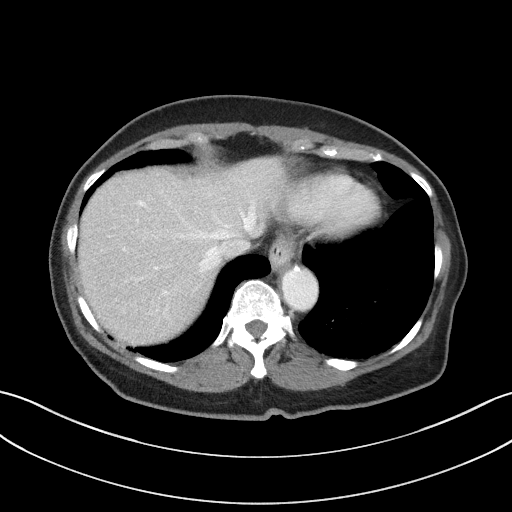
[im 84/89  soft-tissue]
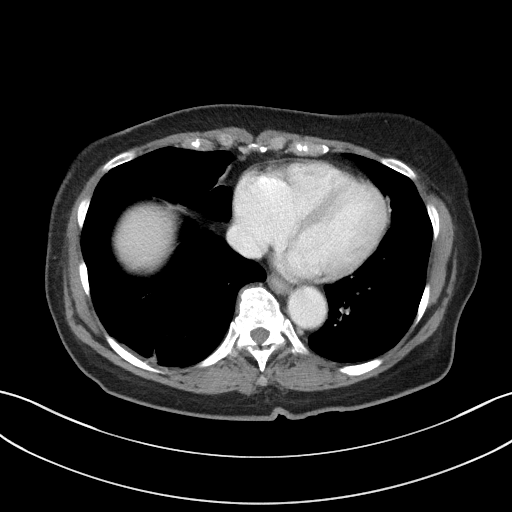

[Series 5: coronal st · coronal · 0.63mm/px · 3 of 81 slices shown]
[im 27/81  soft-tissue]
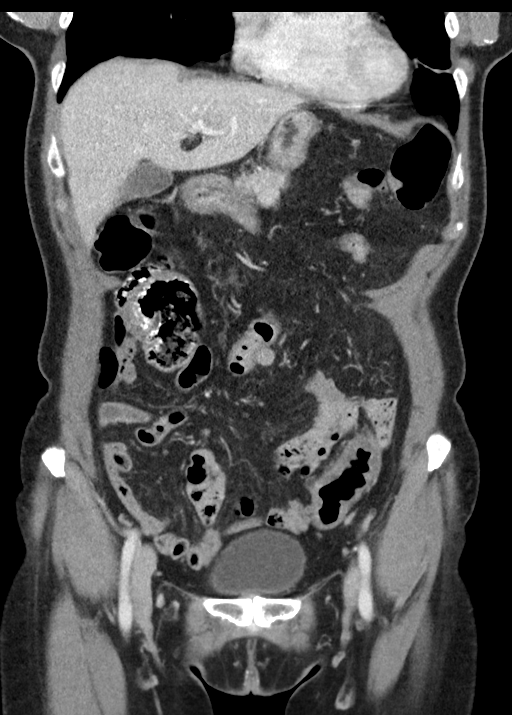
[im 36/81  soft-tissue]
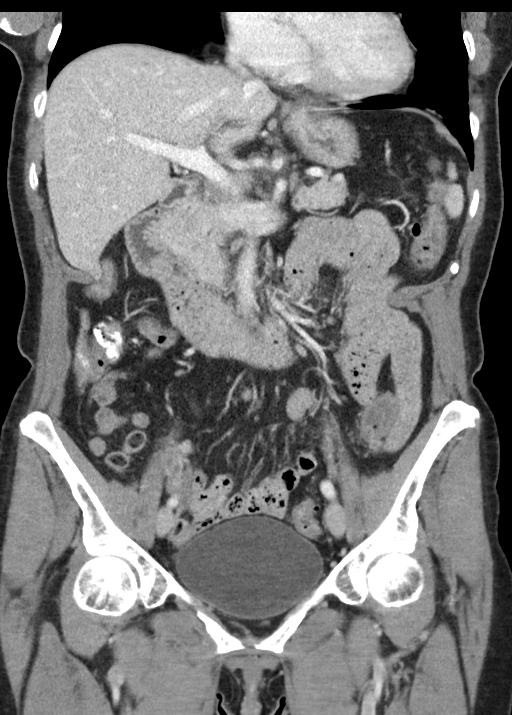
[im 45/81  soft-tissue]
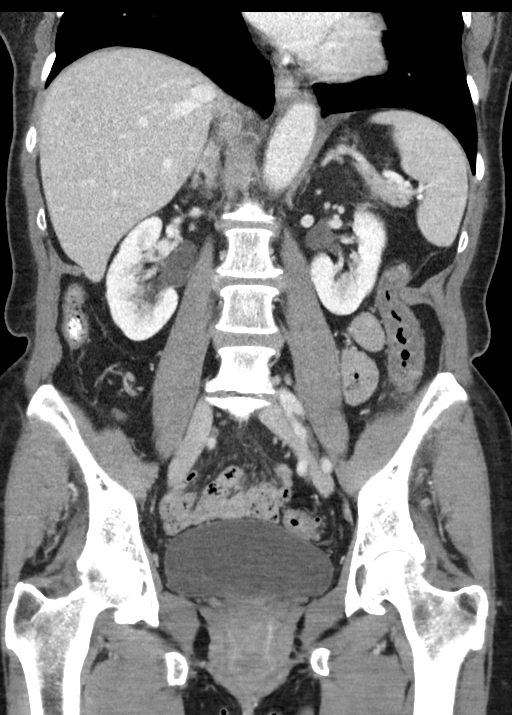

[16 of 46 positions shown; findings below may reference images not displayed]

FINDINGS: Lower chest: No acute abnormality.

Hepatobiliary: No focal liver abnormality is seen. No gallstones,
gallbladder wall thickening, or biliary dilatation.

Pancreas: Unremarkable. No pancreatic ductal dilatation or
surrounding inflammatory changes.

Spleen: Normal in size without focal abnormality.

Adrenals/Urinary Tract: Adrenal glands are unremarkable. Kidneys are
normal, without renal calculi, focal lesion, or hydronephrosis.
Bladder is unremarkable.

Stomach/Bowel: The stomach appears normal. The appendix appears
normal. There is no evidence of bowel obstruction. Sigmoid
diverticulosis is noted. There is wall thickening of the descending
colon concerning for infectious or inflammatory colitis.

Vascular/Lymphatic: No significant vascular findings are present. No
enlarged abdominal or pelvic lymph nodes.

Reproductive: Status post hysterectomy. No adnexal masses.

Other: No abdominal wall hernia or abnormality. No abdominopelvic
ascites.

Musculoskeletal: No acute or significant osseous findings.
IMPRESSION: 1. Wall thickening of the descending colon is noted concerning for
infectious or inflammatory colitis.
2. Sigmoid diverticulosis without inflammation.

## 2021-03-10 DIAGNOSIS — B001 Herpesviral vesicular dermatitis: Secondary | ICD-10-CM | POA: Diagnosis not present

## 2021-03-10 DIAGNOSIS — L723 Sebaceous cyst: Secondary | ICD-10-CM | POA: Diagnosis not present

## 2021-03-24 ENCOUNTER — Other Ambulatory Visit: Payer: Self-pay | Admitting: Family Medicine

## 2021-03-24 DIAGNOSIS — G43809 Other migraine, not intractable, without status migrainosus: Secondary | ICD-10-CM

## 2021-03-30 ENCOUNTER — Telehealth: Payer: Self-pay | Admitting: *Deleted

## 2021-03-30 NOTE — Chronic Care Management (AMB) (Signed)
  Care Management   Note  03/30/2021 Name: Laura Hopkins MRN: [id number] DOB: 04-11-1946  Laura Hopkins is a 75 y.o. year old female who is a primary care patient of Ann Held, DO and is actively engaged with the care management team. I reached out to [name] by phone today to assist with return call about upcoming a follow up visit with the Pharmacist  Follow up plan: Patient needs to cancel call with Pharmacist on 04/02/2021 due to being out of state. Appropriate care team members and provider have been notified via electronic communication. The patient has been provided with contact information for the care management team and has been advised to call with any health related questions or concerns.  If patient returns call to provider office, please advise to call [facility]

## 2021-03-31 ENCOUNTER — Ambulatory Visit: Payer: Medicare HMO | Admitting: Family Medicine

## 2021-04-02 ENCOUNTER — Telehealth: Payer: Medicare HMO

## 2021-04-25 ENCOUNTER — Other Ambulatory Visit: Payer: Self-pay | Admitting: Family Medicine

## 2021-04-25 DIAGNOSIS — G4700 Insomnia, unspecified: Secondary | ICD-10-CM

## 2021-04-26 ENCOUNTER — Other Ambulatory Visit: Payer: Self-pay

## 2021-04-26 DIAGNOSIS — G4700 Insomnia, unspecified: Secondary | ICD-10-CM

## 2021-04-26 MED ORDER — TEMAZEPAM 30 MG PO CAPS: ORAL_CAPSULE | ORAL | 0 refills | Status: DC

## 2021-04-29 ENCOUNTER — Other Ambulatory Visit: Payer: Self-pay | Admitting: Family Medicine

## 2021-04-29 DIAGNOSIS — G4700 Insomnia, unspecified: Secondary | ICD-10-CM

## 2021-04-30 ENCOUNTER — Ambulatory Visit (INDEPENDENT_AMBULATORY_CARE_PROVIDER_SITE_OTHER): Payer: Medicare HMO | Admitting: Family Medicine

## 2021-04-30 ENCOUNTER — Encounter: Payer: Self-pay | Admitting: Family Medicine

## 2021-04-30 ENCOUNTER — Other Ambulatory Visit: Payer: Self-pay

## 2021-04-30 VITALS — BP 139/88 | HR 74 | Temp 98.4°F | Resp 16 | Ht 64.0 in | Wt 152.0 lb

## 2021-04-30 DIAGNOSIS — Z01818 Encounter for other preprocedural examination: Secondary | ICD-10-CM | POA: Diagnosis not present

## 2021-04-30 DIAGNOSIS — G4700 Insomnia, unspecified: Secondary | ICD-10-CM | POA: Diagnosis not present

## 2021-04-30 LAB — CBC WITH DIFFERENTIAL/PLATELET
Basophils Absolute: 0.1 10*3/uL (ref 0.0–0.1)
Basophils Relative: 1 % (ref 0.0–3.0)
Eosinophils Absolute: 0.3 10*3/uL (ref 0.0–0.7)
Eosinophils Relative: 5.3 % — ABNORMAL HIGH (ref 0.0–5.0)
HCT: 46.3 % — ABNORMAL HIGH (ref 36.0–46.0)
Hemoglobin: 15.1 g/dL — ABNORMAL HIGH (ref 12.0–15.0)
Lymphocytes Relative: 19.9 % (ref 12.0–46.0)
Lymphs Abs: 1.2 10*3/uL (ref 0.7–4.0)
MCHC: 32.6 g/dL (ref 30.0–36.0)
MCV: 96.9 fl (ref 78.0–100.0)
Monocytes Absolute: 0.4 10*3/uL (ref 0.1–1.0)
Monocytes Relative: 6.8 % (ref 3.0–12.0)
Neutro Abs: 4 10*3/uL (ref 1.4–7.7)
Neutrophils Relative %: 67 % (ref 43.0–77.0)
Platelets: 247 10*3/uL (ref 150.0–400.0)
RBC: 4.78 Mil/uL (ref 3.87–5.11)
RDW: 14.1 % (ref 11.5–15.5)
WBC: 5.9 10*3/uL (ref 4.0–10.5)

## 2021-04-30 LAB — COMPREHENSIVE METABOLIC PANEL
ALT: 7 U/L (ref 0–35)
AST: 21 U/L (ref 0–37)
Albumin: 4.4 g/dL (ref 3.5–5.2)
Alkaline Phosphatase: 69 U/L (ref 39–117)
BUN: 14 mg/dL (ref 6–23)
CO2: 29 mEq/L (ref 19–32)
Calcium: 10.2 mg/dL (ref 8.4–10.5)
Chloride: 102 mEq/L (ref 96–112)
Creatinine, Ser: 1.02 mg/dL (ref 0.40–1.20)
GFR: 54.92 mL/min — ABNORMAL LOW (ref >60.00)
Glucose, Bld: 96 mg/dL (ref 70–99)
Potassium: 5.2 mEq/L — ABNORMAL HIGH (ref 3.5–5.1)
Sodium: 139 mEq/L (ref 135–145)
Total Bilirubin: 0.7 mg/dL (ref 0.2–1.2)
Total Protein: 6.9 g/dL (ref 6.0–8.3)

## 2021-04-30 LAB — HEMOGLOBIN A1C: Hgb A1c MFr Bld: 5.8 % (ref 4.6–6.5)

## 2021-04-30 LAB — APTT: aPTT: 30.9 s (ref 23.4–32.7)

## 2021-04-30 MED ORDER — TEMAZEPAM 30 MG PO CAPS: ORAL_CAPSULE | ORAL | 0 refills | Status: DC

## 2021-04-30 NOTE — Assessment & Plan Note (Signed)
Pt cleared for surgery pending labs  ekg done

## 2021-04-30 NOTE — Progress Notes (Signed)
Patient ID: Laura Hopkins, female    DOB: [birth year]  Age: 75 y.o. MRN: [id number]    Subjective:  Subjective  HPI Laura Hopkins presents for an office visit today. She reports feeling well and has no complaints. Pt  is requesting for medical clearance prior to breast implants removal. EKG was performed at the office indicating normal results. She notes that she had experienced nausea after receiving anesthesia.  She endorses taking 30 mg of Restoril PO QHS for sleep.  She denies any chest pain, SOB, fever, abdominal pain, cough, chills, sore throat, dysuria, urinary incontinence, back pain, HA, or N/V/D at this time.   Review of Systems  Constitutional:  Negative for chills, fatigue and fever.  HENT:  Negative for ear pain, rhinorrhea, sinus pressure, sinus pain, sore throat and tinnitus.   Eyes:  Negative for pain.  Respiratory:  Negative for cough, shortness of breath and wheezing.   Cardiovascular:  Negative for chest pain.  Gastrointestinal:  Negative for abdominal pain, anal bleeding, constipation, diarrhea, nausea and vomiting.  Genitourinary:  Negative for flank pain.  Musculoskeletal:  Negative for back pain and neck pain.  Skin:  Negative for rash.  Neurological:  Negative for seizures, weakness, light-headedness, numbness and headaches.   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 Amy Martinique    URI  02/07/2007   Qualifier: Diagnosis of  By: Jerold Coombe      She has a past surgical history that includes Abdominal hysterectomy (1995); Shoulder surgery (2000); Nasal septum surgery (1983); Colonoscopy (2007); Wisdom tooth extraction; Tubal ligation (1978); and Eye surgery (Bilateral, 03/03/2017).   Her family history includes Arthritis in her father; Dementia in her mother; Diabetes in her father; Hypertension in her father and mother; Prostate cancer in her father; Skin cancer in her brother.She reports that she quit smoking about 22 years ago. Her smoking use included cigarettes. She smoked an average of .3 packs per day. She has never used smokeless tobacco. She reports that she does not drink alcohol and does not use drugs.  Current Outpatient Medications on File Prior to Visit  Medication Sig Dispense Refill   acetaminophen (TYLENOL) 500 MG tablet Take 500 mg by mouth every 6 (six) hours as needed.     albuterol (VENTOLIN HFA) 108 (90 Base) MCG/ACT inhaler Inhale 2 puffs into the lungs every 6 (six) hours as needed for wheezing or shortness of breath. 18 g 2   Apoaequorin (PREVAGEN PO) Take 1 tablet by mouth daily.     COVID-19 mRNA vaccine, Pfizer, 30 MCG/0.3ML injection INJECT AS DIRECTED .3 mL 0   estradiol (VIVELLE-DOT) 0.05 MG/24HR patch Place 1 patch onto the skin once a week.     fluconazole (DIFLUCAN) 150 MG tablet TAKE ONE TABLET BY MOUTH ONCE, MAY REPEAT IN  3 DAYS AS NEEDED 2 tablet 0   fluticasone (FLOVENT HFA) 110 MCG/ACT inhaler Inhale 2 puffs into the lungs 2 (two) times daily as needed. 1 Inhaler 6   Glucosamine-Chondroitin (OSTEO BI-FLEX REGULAR STRENGTH PO) Take 1 tablet by mouth daily.      naproxen sodium (ALEVE) 220 MG tablet Take 220 mg by mouth daily as needed.     spironolactone (ALDACTONE) 25 MG tablet TAKE 1 TABLET BY MOUTH EVERY DAY 90 tablet 3   SUMAtriptan (IMITREX) 100 MG tablet TAKE 1 TABLET BY MOUTH DAILY AS NEEDED FOR MIGRAINE. 9 tablet 4   No current  facility-administered medications on file prior to visit.    Ekg-- nsr -low voltage  Objective:  Objective  Physical Exam Vitals and nursing note reviewed.  Constitutional:      General: She is not in acute distress.    Appearance: Normal appearance. She is well-developed. She is not ill-appearing.  HENT:     Head: Normocephalic and atraumatic.     Right Ear: Tympanic membrane, ear canal and external ear normal.     Left Ear: Tympanic membrane, ear canal and external ear normal.     Nose: Nose normal.  Eyes:     General:        Right eye: No discharge.        Left eye: No discharge.     Extraocular Movements: Extraocular movements intact.     Pupils: Pupils are equal, round, and reactive to light.  Cardiovascular:     Rate and Rhythm: Normal rate and regular rhythm.     Pulses: Normal pulses.     Heart sounds: Normal heart sounds. No murmur heard.   No friction rub. No gallop.  Pulmonary:     Effort: Pulmonary effort is normal. No respiratory distress.     Breath sounds: Normal breath sounds. No stridor. No wheezing, rhonchi or rales.  Chest:     Chest wall: No tenderness.  Abdominal:     General: Bowel sounds are normal. There is no distension.     Palpations: Abdomen is soft. There is no mass.     Tenderness: There is no abdominal tenderness. There is no guarding or rebound.     Hernia: No hernia is present.  Musculoskeletal:        General: Normal range of motion.     Cervical back: Normal range of motion and neck supple.     Right lower leg: No edema.     Left lower leg: No edema.  Skin:    General: Skin is warm and dry.  Neurological:     Mental Status: She is alert and oriented to person, place, and time.  Psychiatric:        Behavior: Behavior normal.        Thought Content: Thought content normal.   BP 139/88 (BP Location: Right Arm, Patient Position: Sitting, Cuff Size: Small)   Pulse 74   Temp 98.4 F (36.9 C) (Oral)   Resp 16   Ht '5\' 4"'$  (1.626 m)   Wt  152 lb (68.9 kg)   SpO2 98%   BMI 26.09 kg/m  Wt Readings from Last 3 Encounters:  04/30/21 152 lb (68.9 kg)  10/15/20 149 lb 6.4 oz (67.8 kg)  10/01/20 147 lb 6.4 oz (66.9 kg)     Lab Results  Component Value Date   WBC 8.1 09/15/2020   HGB 13.7 09/15/2020   HCT 41.1 09/15/2020   PLT 311.0 09/15/2020  GLUCOSE 79 09/15/2020   CHOL 191 07/02/2020   TRIG 115 07/02/2020   HDL 75 07/02/2020   LDLCALC 95 07/02/2020   ALT 7 09/15/2020   AST 19 09/15/2020   NA 136 09/15/2020   K 4.4 09/15/2020   CL 100 09/15/2020   CREATININE 0.86 09/15/2020   BUN 14 09/15/2020   CO2 28 09/15/2020   TSH 1.21 03/07/2011    No results found.   Assessment & Plan:  Plan  Meds ordered this encounter  Medications   temazepam (RESTORIL) 30 MG capsule    Sig: TAKE 1 CAPSULE BY MOUTH AT BEDTIME AS NEEDED FOR SLEEP    Dispense:  30 capsule    Refill:  0    Not to exceed 5 additional fills before 08/15/2021 DX Code Needed  .    Problem List Items Addressed This Visit       Unprioritized   Insomnia   Relevant Medications   temazepam (RESTORIL) 30 MG capsule   Preoperative examination - Primary    Pt cleared for surgery pending labs  ekg done        Relevant Orders   EKG 12-Lead (Completed)   CBC with Differential/Platelet   Comprehensive metabolic panel   Hemoglobin A1c   PTT   Protime-INR    Follow-up: No follow-ups on file.   I,Gordon Zheng,acting as a Education administrator for [employer], DO.,have documented all relevant documentation on the behalf of Ann Held, DO,as directed by  Ann Held, DO while in the presence of Wilson, DO, have reviewed all documentation for this visit. The documentation on 04/30/21 for the exam, diagnosis, procedures, and orders are all accurate and complete.

## 2021-05-04 ENCOUNTER — Other Ambulatory Visit: Payer: Self-pay

## 2021-05-04 DIAGNOSIS — E875 Hyperkalemia: Secondary | ICD-10-CM

## 2021-05-04 NOTE — Addendum Note (Signed)
Addended by: CREFT, Kristine Garbe L on: 05/04/2021 11:01 AM   Modules accepted: Orders

## 2021-05-05 ENCOUNTER — Other Ambulatory Visit (INDEPENDENT_AMBULATORY_CARE_PROVIDER_SITE_OTHER): Payer: Medicare HMO

## 2021-05-05 ENCOUNTER — Other Ambulatory Visit: Payer: Self-pay

## 2021-05-05 DIAGNOSIS — E875 Hyperkalemia: Secondary | ICD-10-CM

## 2021-05-05 DIAGNOSIS — Z01818 Encounter for other preprocedural examination: Secondary | ICD-10-CM

## 2021-05-05 LAB — COMPREHENSIVE METABOLIC PANEL
ALT: 6 U/L (ref 0–35)
AST: 18 U/L (ref 0–37)
Albumin: 4.4 g/dL (ref 3.5–5.2)
Alkaline Phosphatase: 67 U/L (ref 39–117)
BUN: 16 mg/dL (ref 6–23)
CO2: 27 mEq/L (ref 19–32)
Calcium: 10 mg/dL (ref 8.4–10.5)
Chloride: 100 mEq/L (ref 96–112)
Creatinine, Ser: 1.03 mg/dL (ref 0.40–1.20)
GFR: 54.27 mL/min — ABNORMAL LOW (ref >60.00)
Glucose, Bld: 91 mg/dL (ref 70–99)
Potassium: 4.9 mEq/L (ref 3.5–5.1)
Sodium: 137 mEq/L (ref 135–145)
Total Bilirubin: 0.9 mg/dL (ref 0.2–1.2)
Total Protein: 6.9 g/dL (ref 6.0–8.3)

## 2021-05-05 LAB — PROTIME-INR
INR: 1 ratio (ref 0.8–1.0)
Prothrombin Time: 11 s (ref 9.6–13.1)

## 2021-05-06 ENCOUNTER — Other Ambulatory Visit: Payer: Self-pay | Admitting: Family Medicine

## 2021-05-06 DIAGNOSIS — E875 Hyperkalemia: Secondary | ICD-10-CM

## 2021-05-10 ENCOUNTER — Encounter: Payer: Self-pay | Admitting: *Deleted

## 2021-05-11 NOTE — Progress Notes (Signed)
Labs faxed with pre op

## 2021-05-12 ENCOUNTER — Other Ambulatory Visit (INDEPENDENT_AMBULATORY_CARE_PROVIDER_SITE_OTHER): Payer: Medicare HMO

## 2021-05-12 ENCOUNTER — Other Ambulatory Visit: Payer: Self-pay

## 2021-05-12 DIAGNOSIS — E875 Hyperkalemia: Secondary | ICD-10-CM | POA: Diagnosis not present

## 2021-05-12 LAB — BASIC METABOLIC PANEL
BUN: 13 mg/dL (ref 6–23)
CO2: 25 mEq/L (ref 19–32)
Calcium: 10 mg/dL (ref 8.4–10.5)
Chloride: 101 mEq/L (ref 96–112)
Creatinine, Ser: 1.11 mg/dL (ref 0.40–1.20)
GFR: 49.61 mL/min — ABNORMAL LOW (ref >60.00)
Glucose, Bld: 76 mg/dL (ref 70–99)
Potassium: 4.5 mEq/L (ref 3.5–5.1)
Sodium: 137 mEq/L (ref 135–145)

## 2021-05-20 DIAGNOSIS — Z20822 Contact with and (suspected) exposure to covid-19: Secondary | ICD-10-CM | POA: Diagnosis not present

## 2021-07-12 ENCOUNTER — Other Ambulatory Visit: Payer: Self-pay

## 2021-07-12 ENCOUNTER — Ambulatory Visit (INDEPENDENT_AMBULATORY_CARE_PROVIDER_SITE_OTHER): Payer: Medicare HMO | Admitting: Family Medicine

## 2021-07-12 ENCOUNTER — Other Ambulatory Visit: Payer: Self-pay | Admitting: Family Medicine

## 2021-07-12 ENCOUNTER — Encounter: Payer: Self-pay | Admitting: Family Medicine

## 2021-07-12 VITALS — BP 110/80 | HR 69 | Temp 97.8°F | Resp 18 | Ht 64.0 in | Wt 150.2 lb

## 2021-07-12 DIAGNOSIS — R053 Chronic cough: Secondary | ICD-10-CM | POA: Diagnosis not present

## 2021-07-12 DIAGNOSIS — J302 Other seasonal allergic rhinitis: Secondary | ICD-10-CM

## 2021-07-12 DIAGNOSIS — Z23 Encounter for immunization: Secondary | ICD-10-CM | POA: Diagnosis not present

## 2021-07-12 MED ORDER — QVAR REDIHALER 40 MCG/ACT IN AERB: 2.0000 | INHALATION_SPRAY | Freq: Two times a day (BID) | RESPIRATORY_TRACT | 1 refills | Status: DC

## 2021-07-12 MED ORDER — MONTELUKAST SODIUM 10 MG PO TABS
10.0000 mg | ORAL_TABLET | Freq: Every day | ORAL | 3 refills | Status: DC
Start: 2021-07-12 — End: 2021-09-02

## 2021-07-12 NOTE — Assessment & Plan Note (Signed)
Improves with albuterol Add qvar and singulair  Need PFTs

## 2021-07-12 NOTE — Patient Instructions (Signed)
Bronchospasm, Adult Bronchospasm is a tightening of the smooth muscle that wraps around the small airways in the lungs. When the muscle tightens, the small airways narrow. Narrowed airways limit the air you breathe in or out of your lungs. Inflammation (swelling) and more mucus (sputum) than usual can further irritate the airways. This can make it very hard to breathe. Bronchospasm can happen suddenly or over a period of time. What are the causes? Common causes of this condition include: An infection, such as a cold or sinus drainage. Exercise. Strong odors from aerosol sprays, and fumes from perfume, candles, and household cleaners. Cold air. Stress or strong emotions such as crying or laughing. What increases the risk? The following factors may make you more likely to develop this condition: Having asthma. Smoking or being around someone who smokes (secondhand smoke). Seasonal allergies, such as pollen or mold. Allergic reaction (anaphylaxis) to food, medicine, or insect bites or stings. What are the signs or symptoms? Symptoms of this condition include: Making a whistling sound when you breathe (wheezing). Coughing. Chest tightness. Shortness of breath. Decreased ability to exercise. Noisy breathing or a high-pitched cough. How is this diagnosed? This condition may be diagnosed based on your medical history and a physical exam. Your health care provider may also perform tests, including: A chest X-ray. Lung function tests. How is this treated? This condition may be treated by: Using inhaled medicines. These open up (relax) the airways and help you breathe. They can be taken with a metered dose inhaler or a nebulizer device. Taking corticosteroid medicines. These may be given to reduce inflammation and swelling. Removing the irritant or trigger that started the bronchospasm. Follow these instructions at home: Medicines Take over-the-counter and prescription medicines only as told  by your health care provider. If you need to use an inhaler or nebulizer to take your medicine, ask your health care provider how to use it correctly. You may be given a spacer to use with your inhaler. This makes it easier to get the medicine from the inhaler into your lungs. Lifestyle Do not use any products that contain nicotine or tobacco, such as cigarettes, e-cigarettes, and chewing tobacco. If you need help quitting, ask your health care provider. Keep track of things that trigger your bronchospasm. Avoid these if possible. When pollen, air pollution, or humidity levels are bad, keep windows closed and use an air conditioner or go to places that have air conditioning. Find ways to manage stress and your emotions, such as mindfulness, relaxation, or breathing exercises. Activity Some people have bronchospasm when they exercise. This is called exercise-induced bronchoconstriction (EIB). If you have this problem, talk with your health care provider about how to manage EIB. Some tips include: Use your fast-acting inhaler before exercise. Exercise indoors if it is very cold, humid, or if the pollen and mold counts are high. Warm up and cool down before and after exercise. Stop exercising right away if your symptoms start or get worse. General instructions If you have asthma, make sure you have an asthma action plan. Stay up to date on your immunizations. Keep all follow-up visits as told by your health care provider. This is important. Get help right away if: You have trouble breathing. Your wheezing and coughing do not get better after taking your medicine. You have chest pain. You have trouble speaking more than one-word sentences. These symptoms may represent a serious problem that is an emergency. Do not wait to see if the symptoms will go away. Get   medical help right away. Call your local emergency services (911 in the [place] Do not drive yourself to the hospital. Summary Bronchospasm  is a tightening of the smooth muscle that wraps around the small airways in the lungs. Some people have bronchospasm when they exercise. This is called exercise-induced bronchoconstriction (EIB). If you have this problem, talk with your health care provider about how to manage EIB. Do not use any products that contain nicotine or tobacco, such as cigarettes, e-cigarettes, and chewing tobacco. Get help right away if your wheezing and coughing do not get better after taking your medicine. This information is not intended to replace advice given to you by your health care provider. Make sure you discuss any questions you have with your health care provider. Document Revised: 10/29/2019 Document Reviewed: 10/29/2019 Elsevier Patient Education  2022 Elsevier Inc.  

## 2021-07-12 NOTE — Progress Notes (Addendum)
Subjective:   By signing my name below, I, Zite Okoli, attest that this documentation has been prepared under the direction and in the presence of Ann Held, DO. 07/12/2021    Patient ID: Laura Hopkins, female    DOB: Oct 20, 1945, 75 y.o.   MRN: [id number]  Chief Complaint  Patient presents with   Sinus Problem    Pt states wanting to get on a regular medication for allergies.     HPI Patient is in today for an office visit.  She reports that she has had a cough and wakes up coughing. She uses albuterol to manage her allergies but it helps a little. She also bought mucinex but that did not help at all after the first couple of days. She also mentions that the cough is making her insomnia worse and would like to know If there is a test for asthma.  She mentions that in the past couple of weeks, she has been feeling indigested at night.  She is willing to get the flu vaccine at this visit.  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 Amy Martinique    URI 02/07/2007   Qualifier: Diagnosis of  By: Jerold Coombe      Past Surgical History:  Procedure Laterality Date   ABDOMINAL HYSTERECTOMY  1995   COLONOSCOPY  2007   colon--negative   EYE SURGERY Bilateral 03/03/2017   cataract sx with lens implant   NASAL SEPTUM SURGERY  1983   SHOULDER SURGERY  2000   TUBAL LIGATION  1978   WISDOM TOOTH EXTRACTION       Family History  Problem Relation Age of Onset   Diabetes Father    Hypertension Father    Arthritis Father    Prostate cancer Father    Skin cancer Brother        melanoma   Hypertension Mother    Dementia Mother    Colon cancer Neg Hx    Colon polyps Neg Hx    Esophageal cancer Neg Hx    Rectal cancer Neg Hx    Stomach cancer Neg Hx     Social History   Socioeconomic History   Marital status: Married    Spouse name: Not on file   Number of children: 2   Years of education: Not on file   Highest education level: Not on file  Occupational History   Occupation: Public affairs consultant: UNEMPLOYED    Comment: retired  Tobacco Use   Smoking status: Former    Packs/day: 0.30    Types: Cigarettes    Quit date: 03/07/1999    Years since quitting: 22.3   Smokeless tobacco: Never  Vaping Use   Vaping Use: Never used  Substance and Sexual Activity  Alcohol use: No    Alcohol/week: 0.0 standard drinks   Drug use: No   Sexual activity: Never    Partners: Male  Other Topics Concern   Not on file  Social History Narrative   Not on file   Social Determinants of Health   Financial Resource Strain: Medium Risk   Difficulty of Paying Living Expenses: Somewhat hard  Food Insecurity: Not on file  Transportation Needs: Not on file  Physical Activity: Not on file  Stress: Not on file  Social Connections: Not on file  Intimate Partner Violence: Not on file    Outpatient Medications Prior to Visit  Medication Sig Dispense Refill   acetaminophen (TYLENOL) 500 MG tablet Take 500 mg by mouth every 6 (six) hours as needed.     albuterol (VENTOLIN HFA) 108 (90 Base) MCG/ACT inhaler Inhale 2 puffs into the lungs every 6 (six) hours as needed for wheezing or shortness of breath. 18 g 2   estradiol (VIVELLE-DOT) 0.05 MG/24HR patch Place 1 patch onto the skin once a week.     spironolactone (ALDACTONE) 25 MG tablet TAKE 1 TABLET BY MOUTH EVERY DAY 90 tablet 3   SUMAtriptan  (IMITREX) 100 MG tablet TAKE 1 TABLET BY MOUTH DAILY AS NEEDED FOR MIGRAINE. 9 tablet 4   temazepam (RESTORIL) 30 MG capsule TAKE 1 CAPSULE BY MOUTH AT BEDTIME AS NEEDED FOR SLEEP 30 capsule 0   Apoaequorin (PREVAGEN PO) Take 1 tablet by mouth daily. (Patient not taking: Reported on 07/12/2021)     COVID-19 mRNA vaccine, Pfizer, 30 MCG/0.3ML injection INJECT AS DIRECTED (Patient not taking: Reported on 07/12/2021) .3 mL 0   fluconazole (DIFLUCAN) 150 MG tablet TAKE ONE TABLET BY MOUTH ONCE, MAY REPEAT IN 3 DAYS AS NEEDED (Patient not taking: Reported on 07/12/2021) 2 tablet 0   fluticasone (FLOVENT HFA) 110 MCG/ACT inhaler Inhale 2 puffs into the lungs 2 (two) times daily as needed. (Patient not taking: Reported on 07/12/2021) 1 Inhaler 6   Glucosamine-Chondroitin (OSTEO BI-FLEX REGULAR STRENGTH PO) Take 1 tablet by mouth daily.  (Patient not taking: Reported on 07/12/2021)     naproxen sodium (ALEVE) 220 MG tablet Take 220 mg by mouth daily as needed. (Patient not taking: Reported on 07/12/2021)     No facility-administered medications prior to visit.    Allergies  Allergen Reactions   Avapro [Irbesartan] Cough   Codeine Other (See Comments)    Makes pt hyper    Metoprolol Cough    Metallic taste , nasal drainage    Norvasc [Amlodipine Besylate] Swelling    Review of Systems  Constitutional:  Negative for fever.  HENT:  Negative for congestion, ear pain, hearing loss, sinus pain and sore throat.   Eyes:  Negative for blurred vision and pain.  Respiratory:  Positive for cough and wheezing (expiratory). Negative for sputum production and shortness of breath.   Cardiovascular:  Negative for chest pain and palpitations.  Gastrointestinal:  Negative for blood in stool, constipation, diarrhea, nausea and vomiting.  Genitourinary:  Negative for dysuria, frequency, hematuria and urgency.  Musculoskeletal:  Negative for back pain, falls and myalgias.  Neurological:  Negative for dizziness,  sensory change, loss of consciousness, weakness and headaches.  Endo/Heme/Allergies:  Negative for environmental allergies. Does not bruise/bleed easily.  Psychiatric/Behavioral:  Negative for depression and suicidal ideas. The patient has insomnia. The patient is not nervous/anxious.       Objective:    Physical Exam Vitals and nursing note reviewed.  Constitutional:  General: She is not in acute distress.    Appearance: Normal appearance. She is not ill-appearing.  HENT:     Head: Normocephalic and atraumatic.     Right Ear: External ear normal.     Left Ear: External ear normal.  Eyes:     Extraocular Movements: Extraocular movements intact.     Pupils: Pupils are equal, round, and reactive to light.  Cardiovascular:     Rate and Rhythm: Normal rate and regular rhythm.     Pulses: Normal pulses.     Heart sounds: Normal heart sounds. No murmur heard.   No gallop.  Pulmonary:     Effort: Pulmonary effort is normal. No respiratory distress.     Breath sounds: Wheezing present. No rhonchi or rales.  Abdominal:     General: Bowel sounds are normal. There is no distension.     Palpations: Abdomen is soft. There is no mass.     Tenderness: There is no abdominal tenderness. There is no guarding or rebound.     Hernia: No hernia is present.  Musculoskeletal:     Cervical back: Normal range of motion and neck supple.  Lymphadenopathy:     Cervical: No cervical adenopathy.  Skin:    General: Skin is warm and dry.  Neurological:     Mental Status: She is alert and oriented to person, place, and time.  Psychiatric:        Mood and Affect: Mood normal.        Behavior: Behavior normal.        Thought Content: Thought content normal.        Judgment: Judgment normal.    BP 110/80 (BP Location: Left Arm, Patient Position: Sitting, Cuff Size: Normal)   Pulse 69   Temp 97.8 F (36.6 C) (Oral)   Resp 18   Ht 5\' 4"  (1.626 m)   Wt 150 lb 3.2 oz (68.1 kg)   SpO2 98%   BMI  25.78 kg/m  Wt Readings from Last 3 Encounters:  07/12/21 150 lb 3.2 oz (68.1 kg)  04/30/21 152 lb (68.9 kg)  10/15/20 149 lb 6.4 oz (67.8 kg)    Diabetic Foot Exam - Simple   No data filed    Lab Results  Component Value Date   WBC 5.9 04/30/2021   HGB 15.1 (H) 04/30/2021   HCT 46.3 (H) 04/30/2021   PLT 247.0 04/30/2021   GLUCOSE 76 05/12/2021   CHOL 191 07/02/2020   TRIG 115 07/02/2020   HDL 75 07/02/2020   LDLCALC 95 07/02/2020   ALT 6 05/05/2021   AST 18 05/05/2021   NA 137 05/12/2021   K 4.5 05/12/2021   CL 101 05/12/2021   CREATININE 1.11 05/12/2021   BUN 13 05/12/2021   CO2 25 05/12/2021   TSH 1.21 03/07/2011   INR 1.0 05/05/2021   HGBA1C 5.8 04/30/2021    Lab Results  Component Value Date   TSH 1.21 03/07/2011   Lab Results  Component Value Date   WBC 5.9 04/30/2021   HGB 15.1 (H) 04/30/2021   HCT 46.3 (H) 04/30/2021   MCV 96.9 04/30/2021   PLT 247.0 04/30/2021   Lab Results  Component Value Date   NA 137 05/12/2021   K 4.5 05/12/2021   CO2 25 05/12/2021   GLUCOSE 76 05/12/2021   BUN 13 05/12/2021   CREATININE 1.11 05/12/2021   BILITOT 0.9 05/05/2021   ALKPHOS 67 05/05/2021   AST 18 05/05/2021   ALT 6  05/05/2021   PROT 6.9 05/05/2021   ALBUMIN 4.4 05/05/2021   CALCIUM 10.0 05/12/2021   ANIONGAP 9 09/01/2020   GFR 49.61 (L) 05/12/2021   Lab Results  Component Value Date   CHOL 191 07/02/2020   Lab Results  Component Value Date   HDL 75 07/02/2020   Lab Results  Component Value Date   LDLCALC 95 07/02/2020   Lab Results  Component Value Date   TRIG 115 07/02/2020   Lab Results  Component Value Date   CHOLHDL 2.5 07/02/2020   Lab Results  Component Value Date   HGBA1C 5.8 04/30/2021       Assessment & Plan:   Problem List Items Addressed This Visit       Unprioritized   Coughing    Improves with albuterol Add qvar and singulair  Need PFTs      Relevant Medications   beclomethasone (QVAR REDIHALER) 40 MCG/ACT  inhaler   Other Relevant Orders   Pulmonary function test   Seasonal allergies - Primary   Relevant Medications   montelukast (SINGULAIR) 10 MG tablet   Other Relevant Orders   Pulmonary function test   Other Visit Diagnoses     Need for influenza vaccination       Relevant Orders   Flu Vaccine QUAD High Dose(Fluad) (Completed)       Meds ordered this encounter  Medications   montelukast (SINGULAIR) 10 MG tablet    Sig: Take 1 tablet (10 mg total) by mouth at bedtime.    Dispense:  30 tablet    Refill:  3   beclomethasone (QVAR REDIHALER) 40 MCG/ACT inhaler    Sig: Inhale 2 puffs into the lungs 2 (two) times daily.    Dispense:  1 each    Refill:  1    I,Zite Okoli,acting as a scribe for [employer], DO.,have documented all relevant documentation on the behalf of Ann Held, DO,as directed by  Ann Held, DO while in the presence of Ann Held, DO.   I, Ann Held, DO., personally preformed the services described in this documentation.  All medical record entries made by the scribe were at my direction and in my presence.  I have reviewed the chart and discharge instructions (if applicable) and agree that the record reflects my personal performance and is accurate and complete. 07/12/2021

## 2021-07-12 NOTE — Telephone Encounter (Signed)
Looks like ins does not cover Qvar inhaler and will cover Flovent.  If ok please put in sig and qty and refills  Pharmacy comment: Alternative Requested:THE PRESCRIBED MEDICATION IS NOT COVERED BY INSURANCE. PLEASE CONSIDER CHANGING TO ONE OF THE SUGGESTED COVERED ALTERNATIVES.

## 2021-07-19 DIAGNOSIS — L820 Inflamed seborrheic keratosis: Secondary | ICD-10-CM | POA: Diagnosis not present

## 2021-07-19 DIAGNOSIS — L570 Actinic keratosis: Secondary | ICD-10-CM | POA: Diagnosis not present

## 2021-07-20 ENCOUNTER — Telehealth: Payer: Self-pay | Admitting: Family Medicine

## 2021-07-20 DIAGNOSIS — R053 Chronic cough: Secondary | ICD-10-CM

## 2021-07-20 NOTE — Telephone Encounter (Signed)
The last referral I see was in 2021? Did I miss something?

## 2021-07-20 NOTE — Telephone Encounter (Signed)
Pt. Stated she has yet to hear anything regarding a referral to a raspatory clinic to test her for asthma.

## 2021-07-21 NOTE — Telephone Encounter (Signed)
Called but had to leave a message because clinic was closed. Advised patient that I did leave a message and I am unaware of hours of the clinic and hopefully they will her back to set up appointment.

## 2021-07-21 NOTE — Telephone Encounter (Signed)
The number that was given was the covid follow up clinic and they called back and they do not do PFTs.  She stated that [facility] usually does this.    Left message on machine for patient that she we may have to refer her out and that we will follow up with next steps.

## 2021-07-22 NOTE — Telephone Encounter (Signed)
Referral placed and patient notified.  

## 2021-07-27 ENCOUNTER — Telehealth: Payer: Self-pay | Admitting: Family Medicine

## 2021-07-27 NOTE — Telephone Encounter (Signed)
error 

## 2021-07-28 ENCOUNTER — Ambulatory Visit (HOSPITAL_BASED_OUTPATIENT_CLINIC_OR_DEPARTMENT_OTHER)
Admission: RE | Admit: 2021-07-28 | Discharge: 2021-07-28 | Disposition: A | Discharge status: Home / Self Care | Payer: Medicare HMO | Source: Ambulatory Visit | Attending: Internal Medicine | Admitting: Internal Medicine

## 2021-07-28 ENCOUNTER — Other Ambulatory Visit: Payer: Self-pay

## 2021-07-28 ENCOUNTER — Telehealth (INDEPENDENT_AMBULATORY_CARE_PROVIDER_SITE_OTHER): Payer: Medicare HMO | Admitting: Internal Medicine

## 2021-07-28 ENCOUNTER — Encounter: Payer: Self-pay | Admitting: Gastroenterology

## 2021-07-28 ENCOUNTER — Encounter: Payer: Self-pay | Admitting: Internal Medicine

## 2021-07-28 VITALS — BP 132/80 | HR 79 | Ht 64.0 in | Wt 150.0 lb

## 2021-07-28 DIAGNOSIS — R110 Nausea: Secondary | ICD-10-CM | POA: Insufficient documentation

## 2021-07-28 DIAGNOSIS — K5900 Constipation, unspecified: Secondary | ICD-10-CM | POA: Diagnosis not present

## 2021-07-28 DIAGNOSIS — R1010 Upper abdominal pain, unspecified: Secondary | ICD-10-CM | POA: Diagnosis not present

## 2021-07-28 DIAGNOSIS — M47816 Spondylosis without myelopathy or radiculopathy, lumbar region: Secondary | ICD-10-CM | POA: Diagnosis not present

## 2021-07-28 LAB — HEMOCCULT GUIAC POC 1CARD (OFFICE): Fecal Occult Blood, POC: NEGATIVE

## 2021-07-28 MED ORDER — PANTOPRAZOLE SODIUM 40 MG PO TBEC
40.0000 mg | DELAYED_RELEASE_TABLET | Freq: Two times a day (BID) | ORAL | 0 refills | Status: DC
Start: 2021-07-28 — End: 2021-08-24

## 2021-07-28 MED ORDER — ONDANSETRON HCL 8 MG PO TABS: 8.0000 mg | ORAL_TABLET | Freq: Three times a day (TID) | ORAL | 0 refills | Status: DC | PRN

## 2021-07-28 NOTE — Progress Notes (Signed)
Subjective:    Patient ID: Laura Hopkins, female    DOB: 1946-09-03, 75 y.o.   MRN: [id number]  DOS:  07/28/2021 Type of visit - description: Acute  (This is acute visit, started virtually, patient was recommended to be seen in person, elected to come to the office rather than the emergency room)  Symptoms a started approximately 07/21/2021: She is waking up with nausea, it lasts most of the day. Initially nausea was worse with food intake but now seems to be better after she eats or drinks something. The last few days was doing a very bland diet with crackers and sodas, she has been pushing fluids. Yesterday  for the first time she was able to tolerate a sandwich however the nausea seems to be worse this morning. She feels fatigue, has a dry mouth.  She started singular few days prior to the onset of symptoms due to cough and asthma and actually respiratory symptoms are better.  Has a history of migraine headaches, they have been a little more frequent lately. Has used Imitrex twice. Has not gotten "the worst headache of her life" and denies neck stiffness.  Review of Systems Denies fevers No change in the color of the stools, no blood in the stools. Last BM was 3 days ago. (No rare for her to have constipation) No diarrhea  Has some upper abdominal discomfort. No LUTS No recent NSAIDs No respiratory symptoms Mildly dizzy when she stands up.   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 Amy Martinique    URI 02/07/2007   Qualifier: Diagnosis of  By: Jerold Coombe      Past Surgical History:  Procedure Laterality Date   ABDOMINAL HYSTERECTOMY  1995   COLONOSCOPY  2007   colon--negative   EYE SURGERY Bilateral 03/03/2017   cataract sx with lens implant   [place] EXTRACTION      Allergies as of 07/28/2021       Reactions   Avapro [irbesartan] Cough   Codeine Other (See Comments)   Makes pt hyper    Metoprolol Cough   Metallic taste , nasal drainage    Norvasc [amlodipine Besylate] Swelling        Medication List        Accurate as of July 28, 2021 11:59 PM. If you have any questions, ask your nurse or doctor.          STOP taking these medications    fluconazole 150 MG tablet Commonly known as: DIFLUCAN Stopped by: Kathlene November, MD   naproxen sodium 220 MG tablet Commonly known as: ALEVE Stopped by: Kathlene November, MD   OSTEO BI-FLEX REGULAR STRENGTH PO  Stopped by: Kathlene November, MD   PREVAGEN PO Stopped by: Kathlene November, MD       TAKE these medications    acetaminophen 500 MG tablet Commonly known as: TYLENOL Take 500 mg by mouth every 6 (six) hours as needed.   albuterol 108 (90 Base) MCG/ACT inhaler Commonly known as: VENTOLIN HFA Inhale 2 puffs into the lungs every 6 (six) hours as needed for wheezing or shortness of breath.   estradiol 0.05 MG/24HR patch Commonly known as: VIVELLE-DOT Place 1 patch onto the skin once a week.   fluticasone 44 MCG/ACT inhaler Commonly known as: Flovent HFA Inhale 2 puffs into the lungs 2 (two) times daily. What changed: Another medication with the same name was removed. Continue taking this medication, and follow the directions you see here. Changed by: Kathlene November, MD   montelukast 10 MG tablet Commonly known as: SINGULAIR Take 1 tablet (10 mg  total) by mouth at bedtime.   ondansetron 8 MG tablet Commonly known as: Zofran Take 1 tablet (8 mg total) by mouth every 8 (eight) hours as needed for nausea or vomiting. Started by: Kathlene November, MD   pantoprazole 40 MG tablet Commonly known as: PROTONIX Take 1 tablet (40 mg total) by mouth 2 (two) times daily. Started by: Kathlene November, MD   spironolactone 25 MG tablet Commonly known as: ALDACTONE TAKE 1 TABLET BY MOUTH EVERY DAY   SUMAtriptan 100 MG tablet Commonly known as: IMITREX TAKE 1 TABLET BY MOUTH DAILY AS NEEDED FOR MIGRAINE.   temazepam 30 MG capsule Commonly known as: RESTORIL TAKE 1 CAPSULE BY MOUTH AT BEDTIME AS NEEDED FOR SLEEP           Objective:   Physical Exam BP 132/80 (BP Location: Left Arm, Patient Position: Sitting, Cuff Size: Small)   Pulse 79   Ht 5\' 4"  (1.626 m)   Wt 150 lb (68 kg)   SpO2 94%   BMI 25.75 kg/m  General:   Well developed, NAD, BMI noted.  HEENT:  Normocephalic . Face symmetric, atraumatic.  Not pale Lungs:  CTA B Normal respiratory effort, no intercostal retractions, no accessory muscle use. Heart: RRR,  no murmur.  Abdomen:  Not distended, soft, + tenderness without mass or rebound of the upper abdomen bilaterally. DRE: Normal sphincter tone, no mass, brown stools, Hemoccult Skin: Not pale. Not jaundice Lower extremities: no pretibial edema bilaterally  Neurologic:  alert & oriented X3.  Speech normal, gait appropriate for age and unassisted Psych--  Cognition and judgment appear intact.  Cooperative with normal attention span and concentration.  Behavior appropriate. No anxious or depressed appearing.     Assessment   75 year old female, PMH includes depression, insomnia, OSA on CPAP, HTN, abdominal hysterectomy, presents with   Nausea, abdominal pain: Symptoms a started a week ago, orthostatic vital signs are negative, she is tender at the upper abdomen bilaterally. Pain does not radiate to the back , no h/o CAD  or PVD Brown stools, Hemoccult negative. No recent NSAIDs. No ETOH Does have a history of colitis on CT abdomen November 2021. DDx include gastritis, PUD, pancreatitis, others. Plan: CMP, CBC, amylase, lipase, UA. Stat abdominal x-rays Start pantoprazole twice daily, Zofran,  GI referral Depending on results (anemia, etc) I might ask her to go to the ER tonight, she is aware. ER if: Increased symptoms, abnormal results.  She is in agreement. Addendum 07/30/2019:  Labs are very good, spoke with the patient, she feels somewhat better. Plan: - For completeness  we will get abdominal ultrasound - See PCP next week if symptoms continue. - See GI as recommended 09/02/2021 - Call anytime if symptoms worsen.     This visit occurred during the SARS-CoV-2 public health emergency.  Safety protocols were in place, including screening questions prior to the visit, additional usage of staff PPE, and extensive cleaning of exam room while observing appropriate contact time as indicated for disinfecting solutions.

## 2021-07-28 NOTE — Patient Instructions (Addendum)
We are referring you to the gastroenterologist.  Please call them at [phone]  Start pantoprazole 1 tablet twice daily on an empty stomach  Zofran as needed for nausea  Avoid any naproxen or ibuprofen.  Tylenol is okay  ER if: You are not getting better in a day or 2, severe symptoms, change in the color of the stools, blood in the stools, fever or chills.    GO TO THE LAB : Get the blood work     [place], [place] Come back for a checkup next week.

## 2021-07-29 LAB — CBC WITH DIFFERENTIAL/PLATELET
Basophils Absolute: 0.1 10*3/uL (ref 0.0–0.1)
Basophils Relative: 0.7 % (ref 0.0–3.0)
Eosinophils Absolute: 0.3 10*3/uL (ref 0.0–0.7)
Eosinophils Relative: 3.5 % (ref 0.0–5.0)
HCT: 42.6 % (ref 36.0–46.0)
Hemoglobin: 14 g/dL (ref 12.0–15.0)
Lymphocytes Relative: 17.5 % (ref 12.0–46.0)
Lymphs Abs: 1.4 10*3/uL (ref 0.7–4.0)
MCHC: 33 g/dL (ref 30.0–36.0)
MCV: 97.2 fl (ref 78.0–100.0)
Monocytes Absolute: 0.5 10*3/uL (ref 0.1–1.0)
Monocytes Relative: 6.3 % (ref 3.0–12.0)
Neutro Abs: 5.6 10*3/uL (ref 1.4–7.7)
Neutrophils Relative %: 72 % (ref 43.0–77.0)
Platelets: 262 10*3/uL (ref 150.0–400.0)
RBC: 4.38 Mil/uL (ref 3.87–5.11)
RDW: 13.1 % (ref 11.5–15.5)
WBC: 7.8 10*3/uL (ref 4.0–10.5)

## 2021-07-29 LAB — URINALYSIS, ROUTINE W REFLEX MICROSCOPIC
Bilirubin Urine: NEGATIVE
Hgb urine dipstick: NEGATIVE
Ketones, ur: NEGATIVE
Leukocytes,Ua: NEGATIVE
Nitrite: NEGATIVE
Specific Gravity, Urine: <=1.005 — AB (ref 1.000–1.030)
Total Protein, Urine: NEGATIVE
Urine Glucose: NEGATIVE
Urobilinogen, UA: 0.2 (ref 0.0–1.0)
pH: 6 (ref 5.0–8.0)

## 2021-07-29 LAB — COMPREHENSIVE METABOLIC PANEL
ALT: 6 U/L (ref 0–35)
AST: 17 U/L (ref 0–37)
Albumin: 4.5 g/dL (ref 3.5–5.2)
Alkaline Phosphatase: 73 U/L (ref 39–117)
BUN: 8 mg/dL (ref 6–23)
CO2: 31 mEq/L (ref 19–32)
Calcium: 10 mg/dL (ref 8.4–10.5)
Chloride: 101 mEq/L (ref 96–112)
Creatinine, Ser: 0.93 mg/dL (ref 0.40–1.20)
GFR: 61.25 mL/min (ref >60.00)
Glucose, Bld: 88 mg/dL (ref 70–99)
Potassium: 4.7 mEq/L (ref 3.5–5.1)
Sodium: 138 mEq/L (ref 135–145)
Total Bilirubin: 0.7 mg/dL (ref 0.2–1.2)
Total Protein: 6.9 g/dL (ref 6.0–8.3)

## 2021-07-29 LAB — AMYLASE: Amylase: 59 U/L (ref 27–131)

## 2021-07-29 LAB — LIPASE: Lipase: 21 U/L (ref 11.0–59.0)

## 2021-08-02 ENCOUNTER — Ambulatory Visit: Payer: Medicare HMO | Attending: Internal Medicine

## 2021-08-02 ENCOUNTER — Ambulatory Visit (HOSPITAL_BASED_OUTPATIENT_CLINIC_OR_DEPARTMENT_OTHER)
Admission: RE | Admit: 2021-08-02 | Discharge: 2021-08-02 | Disposition: A | Discharge status: Home / Self Care | Payer: Medicare HMO | Source: Ambulatory Visit | Attending: Internal Medicine | Admitting: Internal Medicine

## 2021-08-02 ENCOUNTER — Other Ambulatory Visit: Payer: Self-pay

## 2021-08-02 DIAGNOSIS — R1010 Upper abdominal pain, unspecified: Secondary | ICD-10-CM | POA: Diagnosis not present

## 2021-08-02 DIAGNOSIS — R110 Nausea: Secondary | ICD-10-CM | POA: Insufficient documentation

## 2021-08-02 DIAGNOSIS — Z23 Encounter for immunization: Secondary | ICD-10-CM

## 2021-08-02 DIAGNOSIS — K7689 Other specified diseases of liver: Secondary | ICD-10-CM | POA: Diagnosis not present

## 2021-08-02 NOTE — Progress Notes (Signed)
   Covid-19 Vaccination Clinic  Name:  Laura Hopkins    MRN: [id number] DOB: 04-26-1946  08/02/2021  [name] was observed post Covid-19 immunization for 15 minutes without incident. She was provided with Vaccine Information Sheet and instruction to access the V-Safe system.   [name] was instructed to call 911 with any severe reactions post vaccine: Difficulty breathing  Swelling of face and throat  A fast heartbeat  A bad rash all over body  Dizziness and weakness   Immunizations Administered     Name Date Dose VIS Date Route   Pfizer Covid-19 Vaccine Bivalent Booster 08/02/2021  9:21 AM 0.3 mL 06/02/2021 Intramuscular   Manufacturer: [employer]   Lot: [id number]   [place]: [phone]

## 2021-08-03 ENCOUNTER — Ambulatory Visit: Payer: Medicare HMO | Admitting: Family Medicine

## 2021-08-05 ENCOUNTER — Other Ambulatory Visit: Payer: Self-pay

## 2021-08-05 ENCOUNTER — Encounter: Payer: Self-pay | Admitting: Pulmonary Disease

## 2021-08-05 ENCOUNTER — Ambulatory Visit: Payer: Medicare HMO | Admitting: Pulmonary Disease

## 2021-08-05 VITALS — BP 116/68 | HR 67 | Temp 98.0°F | Ht 64.0 in | Wt 151.4 lb

## 2021-08-05 DIAGNOSIS — R053 Chronic cough: Secondary | ICD-10-CM | POA: Diagnosis not present

## 2021-08-05 MED ORDER — BUDESONIDE-FORMOTEROL FUMARATE 160-4.5 MCG/ACT IN AERO: 2.0000 | INHALATION_SPRAY | Freq: Two times a day (BID) | RESPIRATORY_TRACT | 5 refills | Status: DC

## 2021-08-05 MED ORDER — OMEPRAZOLE 20 MG PO CPDR: 20.0000 mg | DELAYED_RELEASE_CAPSULE | Freq: Every day | ORAL | 5 refills | Status: DC

## 2021-08-05 NOTE — Progress Notes (Signed)
Laura Hopkins    [id number]    1946/05/13  Primary Care Physician:Lowne Cheri Rous Alferd Apa, DO  Referring Physician: Carollee Herter, Alferd Apa, DO [address] [place],  [place] [address]  Chief complaint: Consult for asthma  HPI: 75 year old with history of OSA on CPAP, depression, hypertension, hysterectomy  Here for evaluation of asthma  C/O cough for the past 6 years. Originally thought to be secondary to ARB which was held Cough is non productive in nature with wheezing, congestion in morning. Dyspnea is increased with cold air. Started on singulair around summer 2022 with improvement but developed nausea so stopped.  Now she has return of cough  She has been prescribed Flovent which she uses only intermittently  Has occasional GERD for which she takes tums, has allergic rhinitis and pos nasal drip. H/O deviated septum s/p surgery H/O OSA, stopped CPAP in 2021.   Pets: Two dogs Occupation:Retired from postal service Exposures:No mold, hot tub jaccuzi, no down pillows or comforter Smoking history: Never smoker.  Travel history: No travel Relevant family history: No family history of lung disease  Outpatient Encounter Medications as of 08/05/2021  Medication Sig   acetaminophen (TYLENOL) 500 MG tablet Take 500 mg by mouth every 6 (six) hours as needed.   estradiol (VIVELLE-DOT) 0.05 MG/24HR patch Place 1 patch onto the skin once a week.   ondansetron (ZOFRAN) 8 MG tablet Take 1 tablet (8 mg total) by mouth every 8 (eight) hours as needed for nausea or vomiting.   pantoprazole (PROTONIX) 40 MG tablet Take 1 tablet (40 mg total) by mouth 2 (two) times daily.   spironolactone (ALDACTONE) 25 MG tablet TAKE 1 TABLET BY MOUTH EVERY DAY   SUMAtriptan (IMITREX) 100 MG tablet TAKE 1 TABLET BY MOUTH DAILY AS NEEDED FOR MIGRAINE.   temazepam (RESTORIL) 30 MG capsule TAKE 1 CAPSULE BY MOUTH AT BEDTIME AS NEEDED FOR SLEEP   albuterol (VENTOLIN HFA) 108 (90 Base)  MCG/ACT inhaler Inhale 2 puffs into the lungs every 6 (six) hours as needed for wheezing or shortness of breath. (Patient not taking: No sig reported)   fluticasone (FLOVENT HFA) 44 MCG/ACT inhaler Inhale 2 puffs into the lungs 2 (two) times daily. (Patient not taking: No sig reported)   montelukast (SINGULAIR) 10 MG tablet Take 1 tablet (10 mg total) by mouth at bedtime. (Patient not taking: Reported on 08/05/2021)   No facility-administered encounter medications on file as of 08/05/2021.    Allergies as of 08/05/2021 - Review Complete 07/28/2021  Allergen Reaction Noted   Avapro [irbesartan] Cough 02/12/2018   Codeine Other (See Comments) 03/23/2016   Metoprolol Cough 05/31/2018   Norvasc [amlodipine besylate] Swelling 05/31/2018    Past Medical History:  Diagnosis Date   Acute pharyngitis 02/26/2009   Qualifier: Diagnosis of  By: Jerold Coombe     Arthritis    hands and neck    Essential hypertension 02/07/2007   Qualifier: Diagnosis of  By: Jerold Coombe     Headache(784.0)    Insomnia 12/23/2017   Lower extremity edema 07/31/2017   Migraine 05/23/2017   SINUSITIS - ACUTE-NOS 03/17/2009   Qualifier: Diagnosis of  By: Jerold Coombe     Skin cancer    basal cell   SKIN CANCER, HX OF 01/08/2008   Annotation: BSC and SCC Qualifier: Diagnosis of  By: Jerold Coombe   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  Amy Martinique    URI 02/07/2007   Qualifier: Diagnosis of  By: Jerold Coombe      Past Surgical History:  Procedure Laterality Date   ABDOMINAL HYSTERECTOMY  1995   COLONOSCOPY  2007   colon--negative   EYE SURGERY Bilateral 03/03/2017   cataract sx with lens implant   NASAL SEPTUM SURGERY  1983   SHOULDER SURGERY  2000   TUBAL LIGATION  1978   WISDOM TOOTH EXTRACTION      Family History  Problem Relation Age of Onset   Diabetes Father    Hypertension Father    Arthritis Father    Prostate cancer Father    Skin cancer Brother         melanoma   Hypertension Mother    Dementia Mother    Colon cancer Neg Hx    Colon polyps Neg Hx    Esophageal cancer Neg Hx    Rectal cancer Neg Hx    Stomach cancer Neg Hx     Social History   Socioeconomic History   Marital status: Married    Spouse name: Not on file   Number of children: 2   Years of education: Not on file   Highest education level: Not on file  Occupational History   Occupation: Public affairs consultant: UNEMPLOYED    Comment: retired  Tobacco Use   Smoking status: Former    Packs/day: 0.30    Types: Cigarettes    Quit date: 03/07/1999    Years since quitting: 22.4   Smokeless tobacco: Never  Vaping Use   Vaping Use: Never used  Substance and Sexual Activity   Alcohol use: No    Alcohol/week: 0.0 standard drinks   Drug use: No   Sexual activity: Never    Partners: Male  Other Topics Concern   Not on file  Social History Narrative   Not on file   Social Determinants of Health   Financial Resource Strain: Medium Risk   Difficulty of Paying Living Expenses: Somewhat hard  Food Insecurity: Not on file  Transportation Needs: Not on file  Physical Activity: Not on file  Stress: Not on file  Social Connections: Not on file  Intimate Partner Violence: Not on file    Review of systems: Review of Systems  Constitutional: Negative for fever and chills.  HENT: Negative.   Eyes: Negative for blurred vision.  Respiratory: as per HPI  Cardiovascular: Negative for chest pain and palpitations.  Gastrointestinal: Negative for vomiting, diarrhea, blood per rectum. Genitourinary: Negative for dysuria, urgency, frequency and hematuria.  Musculoskeletal: Negative for myalgias, back pain and joint pain.  Skin: Negative for itching and rash.  Neurological: Negative for dizziness, tremors, focal weakness, seizures and loss of consciousness.  Endo/Heme/Allergies: Negative for environmental allergies.  Psychiatric/Behavioral: Negative for depression,  suicidal ideas and hallucinations.  All other systems reviewed and are negative.  Physical Exam: Blood pressure 116/68, pulse 67, temperature 98 F (36.7 C), temperature source Oral, height 5\' 4"  (1.626 m), weight 151 lb 6.4 oz (68.7 kg), SpO2 98 %. Gen:      No acute distress HEENT:  EOMI, sclera anicteric Neck:     No masses; no thyromegaly Lungs:    Clear to auscultation bilaterally; normal respiratory effort CV:         Regular rate and rhythm; no murmurs Abd:      + bowel sounds; soft, non-tender; no palpable masses, no distension Ext:    No edema;  adequate peripheral perfusion Skin:      Warm and dry; no rash Neuro: alert and oriented x 3 Psych: normal mood and affect  Data Reviewed: Imaging: Chest x-ray 11/07/2019-no acute cardiopulmonary abnormality I have reviewed the images personally.  PFTs:  Labs: CBC 04/30/2021-WBC 5.9, eos 5.3%, absolute eosinophil count 313  Assessment:  Consult for asthma CBC reviewed with peripheral eosinophils She did have a good response to Singulair but developed nausea Suspect GERD may be playing a role here  Check CBC differential, IgE Start Symbicort 160 Prilosec 20 mg daily for acid reflux Schedule PFTs and follow-up  Consider reinitiation of Singulair after review of response to therapy and tests as above  Plan/Recommendations: CBC, IgE Symbicort Prilosec PFTs  Marshell Garfinkel MD [facility] 08/05/2021, 2:50 PM  CC: Ann Held, *

## 2021-08-05 NOTE — Patient Instructions (Signed)
We will get some labs today including CBC differential, IgE We will start you on an inhaler called Symbicort 160 Start Prilosec 20 mg once daily for acid reflux Schedule PFTs and follow-up in 1 to 2 months.

## 2021-08-06 LAB — CBC WITH DIFFERENTIAL/PLATELET
Basophils Absolute: 0 10*3/uL (ref 0.0–0.1)
Basophils Relative: 0.6 % (ref 0.0–3.0)
Eosinophils Absolute: 0.2 10*3/uL (ref 0.0–0.7)
Eosinophils Relative: 2.1 % (ref 0.0–5.0)
HCT: 41.6 % (ref 36.0–46.0)
Hemoglobin: 13.5 g/dL (ref 12.0–15.0)
Lymphocytes Relative: 21.7 % (ref 12.0–46.0)
Lymphs Abs: 1.6 10*3/uL (ref 0.7–4.0)
MCHC: 32.4 g/dL (ref 30.0–36.0)
MCV: 97.5 fl (ref 78.0–100.0)
Monocytes Absolute: 0.6 10*3/uL (ref 0.1–1.0)
Monocytes Relative: 7.9 % (ref 3.0–12.0)
Neutro Abs: 4.9 10*3/uL (ref 1.4–7.7)
Neutrophils Relative %: 67.7 % (ref 43.0–77.0)
Platelets: 262 10*3/uL (ref 150.0–400.0)
RBC: 4.27 Mil/uL (ref 3.87–5.11)
RDW: 13.8 % (ref 11.5–15.5)
WBC: 7.3 10*3/uL (ref 4.0–10.5)

## 2021-08-06 LAB — IGE: IgE (Immunoglobulin E), Serum: 300 kU/L — ABNORMAL HIGH (ref <=114)

## 2021-08-09 ENCOUNTER — Telehealth: Payer: Self-pay | Admitting: Family Medicine

## 2021-08-09 NOTE — Telephone Encounter (Signed)
Left message for patient to call back and schedule Medicare Annual Wellness Visit (AWV) in office.   If not able to come in office, please offer to do virtually or by telephone.  Left office number and my jabber [phone].  Last AWV:08/14/2017  Please schedule at anytime with Nurse Health Advisor.

## 2021-08-19 ENCOUNTER — Other Ambulatory Visit: Payer: Self-pay | Admitting: Family Medicine

## 2021-08-19 DIAGNOSIS — G4700 Insomnia, unspecified: Secondary | ICD-10-CM

## 2021-08-19 NOTE — Telephone Encounter (Signed)
Requesting: temazepam 30mg   Contract: 02/12/2018 UDS: 02/12/2018 Last Visit: 07/12/2021 Next Visit: None Last Refill: 04/30/2021 #30 and 0RF  Please Advise

## 2021-08-23 ENCOUNTER — Other Ambulatory Visit (HOSPITAL_BASED_OUTPATIENT_CLINIC_OR_DEPARTMENT_OTHER): Payer: Self-pay

## 2021-08-23 MED ORDER — PFIZER COVID-19 VAC BIVALENT 30 MCG/0.3ML IM SUSP
INTRAMUSCULAR | 0 refills | Status: DC
  Filled 2021-08-23: qty 0.3, 1d supply, fill #0

## 2021-08-24 ENCOUNTER — Other Ambulatory Visit: Payer: Self-pay | Admitting: Internal Medicine

## 2021-09-02 ENCOUNTER — Encounter: Payer: Self-pay | Admitting: Gastroenterology

## 2021-09-02 ENCOUNTER — Ambulatory Visit: Payer: Medicare HMO | Admitting: Gastroenterology

## 2021-09-02 VITALS — BP 118/80 | HR 72 | Ht 63.5 in | Wt 152.0 lb

## 2021-09-02 DIAGNOSIS — R1013 Epigastric pain: Secondary | ICD-10-CM

## 2021-09-02 DIAGNOSIS — R1011 Right upper quadrant pain: Secondary | ICD-10-CM | POA: Diagnosis not present

## 2021-09-02 DIAGNOSIS — R110 Nausea: Secondary | ICD-10-CM

## 2021-09-02 MED ORDER — OMEPRAZOLE 40 MG PO CPDR: 40.0000 mg | DELAYED_RELEASE_CAPSULE | Freq: Every day | ORAL | 3 refills | Status: DC

## 2021-09-02 MED ORDER — FAMOTIDINE 20 MG PO TABS: 20.0000 mg | ORAL_TABLET | Freq: Every day | ORAL | 2 refills | Status: DC

## 2021-09-02 NOTE — Patient Instructions (Addendum)
You have been scheduled for an endoscopy. Please follow written instructions given to you at your visit today. If you use inhalers (even only as needed), please bring them with you on the day of your procedure.   Increase Omeprazole to 40 mg daily 30 minutes before breakfast  We have sent Pepcid to your pharmacy  Eat small frequent meals.low fat diet   Gastroesophageal Reflux Disease, Adult Gastroesophageal reflux (GER) happens when acid from the stomach flows up into the tube that connects the mouth and the stomach (esophagus). Normally, food travels down the esophagus and stays in the stomach to be digested. However, when a person has GER, food and stomach acid sometimes move back up into the esophagus. If this becomes a more serious problem, the person may be diagnosed with a disease called gastroesophageal reflux disease (GERD). GERD occurs when the reflux: Happens often. Causes frequent or severe symptoms. Causes problems such as damage to the esophagus. When stomach acid comes in contact with the esophagus, the acid may cause inflammation in the esophagus. Over time, GERD may create small holes (ulcers) in the lining of the esophagus. What are the causes? This condition is caused by a problem with the muscle between the esophagus and the stomach (lower esophageal sphincter, or LES). Normally, the LES muscle closes after food passes through the esophagus to the stomach. When the LES is weakened or abnormal, it does not close properly, and that allows food and stomach acid to go back up into the esophagus. The LES can be weakened by certain dietary substances, medicines, and medical conditions, including: Tobacco use. Pregnancy. Having a hiatal hernia. Alcohol use. Certain foods and beverages, such as coffee, chocolate, onions, and peppermint. What increases the risk? You are more likely to develop this condition if you: Have an increased body weight. Have a connective tissue  disorder. Take NSAIDs, such as ibuprofen. What are the signs or symptoms? Symptoms of this condition include: Heartburn. Difficult or painful swallowing and the feeling of having a lump in the throat. A bitter taste in the mouth. Bad breath and having a large amount of saliva. Having an upset or bloated stomach and belching. Chest pain. Different conditions can cause chest pain. Make sure you see your health care provider if you experience chest pain. Shortness of breath or wheezing. Ongoing (chronic) cough or a nighttime cough. Wearing away of tooth enamel. Weight loss. How is this diagnosed? This condition may be diagnosed based on a medical history and a physical exam. To determine if you have mild or severe GERD, your health care provider may also monitor how you respond to treatment. You may also have tests, including: A test to examine your stomach and esophagus with a small camera (endoscopy). A test that measures the acidity level in your esophagus. A test that measures how much pressure is on your esophagus. A barium swallow or modified barium swallow test to show the shape, size, and functioning of your esophagus. How is this treated? Treatment for this condition may vary depending on how severe your symptoms are. Your health care provider may recommend: Changes to your diet. Medicine. Surgery. The goal of treatment is to help relieve your symptoms and to prevent complications. Follow these instructions at home: Eating and drinking  Follow a diet as recommended by your health care provider. This may involve avoiding foods and drinks such as: Coffee and tea, with or without caffeine. Drinks that contain alcohol. Energy drinks and sports drinks. Carbonated drinks or sodas.  Chocolate and cocoa. Peppermint and mint flavorings. Garlic and onions. Horseradish. Spicy and acidic foods, including peppers, chili powder, curry powder, vinegar, hot sauces, and barbecue  sauce. Citrus fruit juices and citrus fruits, such as oranges, lemons, and limes. Tomato-based foods, such as red sauce, chili, salsa, and pizza with red sauce. Fried and fatty foods, such as donuts, french fries, potato chips, and high-fat dressings. High-fat meats, such as hot dogs and fatty cuts of red and white meats, such as rib eye steak, sausage, ham, and bacon. High-fat dairy items, such as whole milk, butter, and cream cheese. Eat small, frequent meals instead of large meals. Avoid drinking large amounts of liquid with your meals. Avoid eating meals during the 2-3 hours before bedtime. Avoid lying down right after you eat. Do not exercise right after you eat. Lifestyle  Do not use any products that contain nicotine or tobacco. These products include cigarettes, chewing tobacco, and vaping devices, such as e-cigarettes. If you need help quitting, ask your health care provider. Try to reduce your stress by using methods such as yoga or meditation. If you need help reducing stress, ask your health care provider. If you are overweight, reduce your weight to an amount that is healthy for you. Ask your health care provider for guidance about a safe weight loss goal. General instructions Pay attention to any changes in your symptoms. Take over-the-counter and prescription medicines only as told by your health care provider. Do not take aspirin, ibuprofen, or other NSAIDs unless your health care provider told you to take these medicines. Wear loose-fitting clothing. Do not wear anything tight around your waist that causes pressure on your abdomen. Raise (elevate) the head of your bed about 6 inches (15 cm). You can use a wedge to do this. Avoid bending over if this makes your symptoms worse. Keep all follow-up visits. This is important. Contact a health care provider if: You have: New symptoms. Unexplained weight loss. Difficulty swallowing or it hurts to swallow. Wheezing or a  persistent cough. A hoarse voice. Your symptoms do not improve with treatment. Get help right away if: You have sudden pain in your arms, neck, jaw, teeth, or back. You suddenly feel sweaty, dizzy, or light-headed. You have chest pain or shortness of breath. You vomit and the vomit is green, yellow, or black, or it looks like blood or coffee grounds. You faint. You have stool that is red, bloody, or black. You cannot swallow, drink, or eat. These symptoms may represent a serious problem that is an emergency. Do not wait to see if the symptoms will go away. Get medical help right away. Call your local emergency services (911 in the [place] Do not drive yourself to the hospital. Summary Gastroesophageal reflux happens when acid from the stomach flows up into the esophagus. GERD is a disease in which the reflux happens often, causes frequent or severe symptoms, or causes problems such as damage to the esophagus. Treatment for this condition may vary depending on how severe your symptoms are. Your health care provider may recommend diet and lifestyle changes, medicine, or surgery. Contact a health care provider if you have new or worsening symptoms. Take over-the-counter and prescription medicines only as told by your health care provider. Do not take aspirin, ibuprofen, or other NSAIDs unless your health care provider told you to do so. Keep all follow-up visits as told by your health care provider. This is important. This information is not intended to replace advice given to you  by your health care provider. Make sure you discuss any questions you have with your health care provider. Document Revised: 03/30/2020 Document Reviewed: 03/30/2020 Elsevier Patient Education  2022 [employer].   Due to recent changes in healthcare laws, you may see the results of your imaging and laboratory studies on MyChart before your provider has had a chance to review them.  We understand that in some cases  there may be results that are confusing or concerning to you. Not all laboratory results come back in the same time frame and the provider may be waiting for multiple results in order to interpret others.  Please give [place] 48 hours in order for your provider to thoroughly review all the results before contacting the office for clarification of your results.    I appreciate the  opportunity to care for you  Thank You   Harl Bowie , MD

## 2021-09-02 NOTE — Progress Notes (Signed)
Laura Hopkins    [id number]    08/26/1946  Primary Care Physician:Lowne Cheri Rous Alferd Apa, DO  Referring Physician: Carollee Herter, Alferd Apa, DO [address] [place],  [place] [address]   Chief complaint:  Nausea, abdominal pain  HPI: 75 year old very pleasant female here with complaints of epigastric abdominal pain associated with intermittent nausea since October of this year.  Her symptoms started all of a sudden with epigastric and right upper quadrant discomfort.  She was having almost daily nausea with since she started taking omeprazole 20 mg daily her symptoms are somewhat better.  She has intermittent dysphagia and decreased appetite. Denies any rectal bleeding, melena or vomiting.  Abdominal ultrasound 08/02/21 The echogenicity of the liver is increased. This is a nonspecific finding but is most commonly seen with fatty infiltration of the liver. There are no obvious focal liver lesions.  Colonoscopy  04/12/2016 - Diverticulosis in the sigmoid colon and in the descending colon. - Non-bleeding internal hemorrhoids.    Outpatient Encounter Medications as of 09/02/2021  Medication Sig   acetaminophen (TYLENOL) 500 MG tablet Take 500 mg by mouth every 6 (six) hours as needed.   budesonide-formoterol (SYMBICORT) 160-4.5 MCG/ACT inhaler Inhale 2 puffs into the lungs in the morning and at bedtime.   omeprazole (PRILOSEC) 20 MG capsule Take 1 capsule (20 mg total) by mouth daily.   ondansetron (ZOFRAN) 8 MG tablet Take 1 tablet (8 mg total) by mouth every 8 (eight) hours as needed for nausea or vomiting.   promethazine (PHENERGAN) 25 MG tablet Take 25 mg by mouth as needed.   spironolactone (ALDACTONE) 25 MG tablet TAKE 1 TABLET BY MOUTH EVERY DAY   SUMAtriptan (IMITREX) 100 MG tablet TAKE 1 TABLET BY MOUTH DAILY AS NEEDED FOR MIGRAINE.   temazepam (RESTORIL) 30 MG capsule TAKE 1 CAPSULE BY MOUTH AT BEDTIME AS NEEDED FOR SLEEP   albuterol (VENTOLIN HFA)  108 (90 Base) MCG/ACT inhaler Inhale 2 puffs into the lungs every 6 (six) hours as needed for wheezing or shortness of breath. (Patient not taking: Reported on 07/28/2021)   fluticasone (FLOVENT HFA) 44 MCG/ACT inhaler Inhale 2 puffs into the lungs 2 (two) times daily. (Patient not taking: Reported on 07/28/2021)   [DISCONTINUED] COVID-19 mRNA bivalent vaccine, Pfizer, (PFIZER COVID-19 VAC BIVALENT) injection Inject into the muscle.   [DISCONTINUED] estradiol (VIVELLE-DOT) 0.05 MG/24HR patch Place 1 patch onto the skin once a week.   [DISCONTINUED] montelukast (SINGULAIR) 10 MG tablet Take 1 tablet (10 mg total) by mouth at bedtime. (Patient not taking: Reported on 08/05/2021)   No facility-administered encounter medications on file as of 09/02/2021.    Allergies as of 09/02/2021 - Review Complete 09/02/2021  Allergen Reaction Noted   Avapro [irbesartan] Cough 02/12/2018   Codeine Other (See Comments) 03/23/2016   Metoprolol Cough 05/31/2018   Norvasc [amlodipine besylate] Swelling 05/31/2018    Past Medical History:  Diagnosis Date   Acute pharyngitis 02/26/2009   Qualifier: Diagnosis of  By: Jerold Coombe     Arthritis    hands and neck    Essential hypertension 02/07/2007   Qualifier: Diagnosis of  By: Jerold Coombe     Headache(784.0)    Insomnia 12/23/2017   Lower extremity edema 07/31/2017   Migraine 05/23/2017   SINUSITIS - ACUTE-NOS 03/17/2009   Qualifier: Diagnosis of  By: Jerold Coombe     Skin cancer    basal cell  SKIN CANCER, HX OF 01/08/2008   Annotation: BSC and SCC Qualifier: Diagnosis of  By: Jerold Coombe   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 Amy Martinique    URI 02/07/2007   Qualifier: Diagnosis of  By: Jerold Coombe      Past Surgical History:  Procedure Laterality Date   ABDOMINAL HYSTERECTOMY  1995   COLONOSCOPY  2007   colon--negative   EYE SURGERY Bilateral 03/03/2017   cataract sx with lens implant   NASAL SEPTUM  SURGERY  1983   SHOULDER SURGERY  2000   TUBAL LIGATION  1978   WISDOM TOOTH EXTRACTION      Family History  Problem Relation Age of Onset   Diabetes Father    Hypertension Father    Arthritis Father    Prostate cancer Father    Skin cancer Brother        melanoma   Hypertension Mother    Dementia Mother    Colon cancer Neg Hx    Colon polyps Neg Hx    Esophageal cancer Neg Hx    Rectal cancer Neg Hx    Stomach cancer Neg Hx     Social History   Socioeconomic History   Marital status: Married    Spouse name: Not on file   Number of children: 2   Years of education: Not on file   Highest education level: Not on file  Occupational History   Occupation: Public affairs consultant: UNEMPLOYED    Comment: retired  Tobacco Use   Smoking status: Former    Packs/day: 0.30    Types: Cigarettes    Quit date: 03/07/1999    Years since quitting: 22.5   Smokeless tobacco: Never  Vaping Use   Vaping Use: Never used  Substance and Sexual Activity   Alcohol use: No    Alcohol/week: 0.0 standard drinks   Drug use: No   Sexual activity: Never    Partners: Male  Other Topics Concern   Not on file  Social History Narrative   Not on file   Social Determinants of Health   Financial Resource Strain: Medium Risk   Difficulty of Paying Living Expenses: Somewhat hard  Food Insecurity: Not on file  Transportation Needs: Not on file  Physical Activity: Not on file  Stress: Not on file  Social Connections: Not on file  Intimate Partner Violence: Not on file      Review of systems: All other review of systems negative except as mentioned in the HPI.   Physical Exam: Vitals:   09/02/21 1000  BP: 118/80  Pulse: 72   Body mass index is 26.5 kg/m. Gen:      No acute distress HEENT:  sclera anicteric Abd:      soft, right upper quadrant and epigastric tenderness positive; no palpable masses, no distension Ext:    No edema Neuro: alert and oriented x 3 Psych: normal  mood and affect  Data Reviewed:  Reviewed labs, radiology imaging, old records and pertinent past GI work up   Assessment and Plan/Recommendations:  75 year old very pleasant female with history of hypertension, OSA, bronchitis with recent onset epigastric right upper quadrant pain associated with intermittent nausea  Schedule for EGD to exclude peptic ulcer disease, severe erosive esophagitis or gastroduodenitis The risks and benefits as well as alternatives of endoscopic procedure(s) have been discussed and reviewed. All questions answered. The patient agrees to proceed.  GERD: Increase omeprazole to 40  mg daily, advised patient to take it 30 minutes before breakfast and add Pepcid 20 mg daily at bedtime Antireflux measures  If EGD unremarkable and patient continues to have persistent symptoms, will consider HIDA scan to exclude gallbladder dysfunction No evidence of gallstones or gallbladder wall thickening on recent abdominal ultrasound   The patient was provided an opportunity to ask questions and all were answered. The patient agreed with the plan and demonstrated an understanding of the instructions.  Damaris Hippo , MD    CC: Carollee Herter, Alferd Apa, *

## 2021-09-04 ENCOUNTER — Other Ambulatory Visit: Payer: Self-pay | Admitting: Family Medicine

## 2021-09-04 DIAGNOSIS — Z8619 Personal history of other infectious and parasitic diseases: Secondary | ICD-10-CM

## 2021-09-08 ENCOUNTER — Ambulatory Visit (AMBULATORY_SURGERY_CENTER): Payer: Medicare HMO | Admitting: Gastroenterology

## 2021-09-08 ENCOUNTER — Encounter: Payer: Self-pay | Admitting: Gastroenterology

## 2021-09-08 ENCOUNTER — Other Ambulatory Visit: Payer: Self-pay

## 2021-09-08 VITALS — BP 126/77 | HR 64 | Temp 97.5°F | Resp 17 | Ht 63.0 in | Wt 152.0 lb

## 2021-09-08 DIAGNOSIS — R110 Nausea: Secondary | ICD-10-CM

## 2021-09-08 DIAGNOSIS — R112 Nausea with vomiting, unspecified: Secondary | ICD-10-CM | POA: Diagnosis not present

## 2021-09-08 DIAGNOSIS — K449 Diaphragmatic hernia without obstruction or gangrene: Secondary | ICD-10-CM

## 2021-09-08 DIAGNOSIS — R1011 Right upper quadrant pain: Secondary | ICD-10-CM

## 2021-09-08 DIAGNOSIS — R1013 Epigastric pain: Secondary | ICD-10-CM | POA: Diagnosis not present

## 2021-09-08 MED ORDER — SODIUM CHLORIDE 0.9 % IV SOLN: 500.0000 mL | Freq: Once | INTRAVENOUS | Status: DC

## 2021-09-08 MED ORDER — PANTOPRAZOLE SODIUM 40 MG PO TBEC: 40.0000 mg | DELAYED_RELEASE_TABLET | Freq: Two times a day (BID) | ORAL | 3 refills | Status: DC

## 2021-09-08 NOTE — Progress Notes (Signed)
Pt's states no medical or surgical changes since previsit or office visit. VS assessed by D.T 

## 2021-09-08 NOTE — Progress Notes (Signed)
A and O x3. Report to RN. Tolerated MAC anesthesia well.Teeth unchanged after procedure. 

## 2021-09-08 NOTE — Op Note (Addendum)
[facility] Patient Name: Laura Hopkins Procedure Date: 09/08/2021 3:15 PM MRN: [id number] Endoscopist: Mauri Pole , MD Age: 75 Referring MD:  Date of Birth: 1946-01-13 Gender: Female Account #: [id number] Procedure:                Upper GI endoscopy Indications:              Epigastric abdominal pain Medicines:                Monitored Anesthesia Care Procedure:                Pre-Anesthesia Assessment:                           - Prior to the procedure, a History and Physical                            was performed, and patient medications and                            allergies were reviewed. The patient's tolerance of                            previous anesthesia was also reviewed. The risks                            and benefits of the procedure and the sedation                            options and risks were discussed with the patient.                            All questions were answered, and informed consent                            was obtained. Prior Anticoagulants: The patient has                            taken no previous anticoagulant or antiplatelet                            agents. ASA Grade Assessment: II - A patient with                            mild systemic disease. After reviewing the risks                            and benefits, the patient was deemed in                            satisfactory condition to undergo the procedure.                           After obtaining informed consent, the endoscope was  passed under direct vision. Throughout the                            procedure, the patient's blood pressure, pulse, and                            oxygen saturations were monitored continuously. The                            Endoscope was introduced through the mouth, and                            advanced to the second part of duodenum. The upper                            GI endoscopy was  accomplished without difficulty.                            The patient tolerated the procedure well. Scope In: Scope Out: Findings:                 The Z-line was regular and was found 38 cm from the                            incisors.                           A small hiatal hernia was present.                           The stomach was normal.                           The cardia and gastric fundus were normal on                            retroflexion.                           The examined duodenum was normal. Complications:            No immediate complications. Estimated Blood Loss:     Estimated blood loss was minimal. Impression:               - Z-line regular, 38 cm from the incisors.                           - Small hiatal hernia.                           - Normal stomach.                           - Normal examined duodenum.                           - No specimens collected. Recommendation:           -  Patient has a contact number available for                            emergencies. The signs and symptoms of potential                            delayed complications were discussed with the                            patient. Return to normal activities tomorrow.                            Written discharge instructions were provided to the                            patient.                           - Resume previous diet.                           - Continue present medications.                           - Follow an antireflux regimen.                           - Switch to Pantoprazole 40mg  twice daily before                            breakfast and dinner.                           - DC Pepcid and Omeprazole                           - Schedule HIDA scan to exclude gall bladder                            dysfunction at next available appointment                           - Return to GI clinic at the next available                            appointment after HIDA  scan. Mauri Pole, MD 09/08/2021 3:38:07 PM This report has been signed electronically.

## 2021-09-08 NOTE — Progress Notes (Signed)
Please refer to office visit note 09/02/21. No additional changes in H&P Patient is appropriate for planned procedure(s) and anesthesia in an ambulatory setting  K. Denzil Magnuson , MD [phone]

## 2021-09-08 NOTE — Patient Instructions (Signed)
Thank you for allowing [place] to care for you today! Resume previous diet and medications today.  Return to normal activities tomorrow. We will call  to schedule HIDA scan to exclude gall bladder dysfunction . Return to GI clinic following  HIDA scan.      YOU HAD AN ENDOSCOPIC PROCEDURE TODAY AT [facility]:   Refer to the procedure report that was given to you for any specific questions about what was found during the examination.  If the procedure report does not answer your questions, please call your gastroenterologist to clarify.  If you requested that your care partner not be given the details of your procedure findings, then the procedure report has been included in a sealed envelope for you to review at your convenience later.  YOU SHOULD EXPECT: Some feelings of bloating in the abdomen. Passage of more gas than usual.  Walking can help get rid of the air that was put into your GI tract during the procedure and reduce the bloating. If you had a lower endoscopy (such as a colonoscopy or flexible sigmoidoscopy) you may notice spotting of blood in your stool or on the toilet paper. If you underwent a bowel prep for your procedure, you may not have a normal bowel movement for a few days.  Please Note:  You might notice some irritation and congestion in your nose or some drainage.  This is from the oxygen used during your procedure.  There is no need for concern and it should clear up in a day or so.  SYMPTOMS TO REPORT IMMEDIATELY:   Following upper endoscopy (EGD)  Vomiting of blood or coffee ground material  New chest pain or pain under the shoulder blades  Painful or persistently difficult swallowing  New shortness of breath  Fever of 100F or higher  Black, tarry-looking stools  For urgent or emergent issues, a gastroenterologist can be reached at any hour by calling [phone]. Do not use MyChart messaging for urgent concerns.    DIET:  We do recommend a small  meal at first, but then you may proceed to your regular diet.  Drink plenty of fluids but you should avoid alcoholic beverages for 24 hours.  ACTIVITY:  You should plan to take it easy for the rest of today and you should NOT DRIVE or use heavy machinery until tomorrow (because of the sedation medicines used during the test).    FOLLOW UP: Our staff will call the number listed on your records 48-72 hours following your procedure to check on you and address any questions or concerns that you may have regarding the information given to you following your procedure. If we do not reach you, we will leave a message.  We will attempt to reach you two times.  During this call, we will ask if you have developed any symptoms of COVID 19. If you develop any symptoms (ie: fever, flu-like symptoms, shortness of breath, cough etc.) before then, please call [phone].  If you test positive for Covid 19 in the 2 weeks post procedure, please call and report this information to [place].    If any biopsies were taken you will be contacted by phone or by letter within the next 1-3 weeks.  Please call us at [phone] if you have not heard about the biopsies in 3 weeks.    SIGNATURES/CONFIDENTIALITY: You and/or your care partner have signed paperwork which will be entered into your electronic medical record.  These signatures attest  to the fact that that the information above on your After Visit Summary has been reviewed and is understood.  Full responsibility of the confidentiality of this discharge information lies with you and/or your care-partner.

## 2021-09-09 ENCOUNTER — Other Ambulatory Visit: Payer: Self-pay

## 2021-09-09 DIAGNOSIS — R112 Nausea with vomiting, unspecified: Secondary | ICD-10-CM

## 2021-09-10 ENCOUNTER — Other Ambulatory Visit: Payer: Self-pay | Admitting: Family Medicine

## 2021-09-10 ENCOUNTER — Telehealth: Payer: Self-pay

## 2021-09-10 DIAGNOSIS — Z8619 Personal history of other infectious and parasitic diseases: Secondary | ICD-10-CM

## 2021-09-10 NOTE — Telephone Encounter (Signed)
  Follow up Call-  Call back number 09/08/2021  Post procedure Call Back phone  # [phone]  Permission to leave phone message Yes  Some recent data might be hidden     Patient questions:  Do you have a fever, pain , or abdominal swelling? No. Pain Score  0 *  Have you tolerated food without any problems? Yes.    Have you been able to return to your normal activities? Yes.    Do you have any questions about your discharge instructions: Diet   No. Medications  No. Follow up visit  No.  Do you have questions or concerns about your Care? No.  Actions: * If pain score is 4 or above: No action needed, pain <4.

## 2021-09-15 ENCOUNTER — Ambulatory Visit (INDEPENDENT_AMBULATORY_CARE_PROVIDER_SITE_OTHER): Payer: Medicare HMO | Admitting: Pulmonary Disease

## 2021-09-15 ENCOUNTER — Ambulatory Visit: Payer: Medicare HMO | Admitting: Pulmonary Disease

## 2021-09-15 ENCOUNTER — Other Ambulatory Visit: Payer: Self-pay

## 2021-09-15 ENCOUNTER — Encounter: Payer: Self-pay | Admitting: Pulmonary Disease

## 2021-09-15 VITALS — BP 110/60 | HR 66 | Temp 98.1°F | Ht 63.0 in | Wt 153.0 lb

## 2021-09-15 DIAGNOSIS — J4540 Moderate persistent asthma, uncomplicated: Secondary | ICD-10-CM | POA: Diagnosis not present

## 2021-09-15 DIAGNOSIS — R053 Chronic cough: Secondary | ICD-10-CM

## 2021-09-15 LAB — PULMONARY FUNCTION TEST
DL/VA % pred: 100 %
DL/VA: 4.18 ml/min/mmHg/L
DLCO cor % pred: 105 %
DLCO cor: 19.66 ml/min/mmHg
DLCO unc % pred: 105 %
DLCO unc: 19.72 ml/min/mmHg
FEF 25-75 Post: 2.14 L/sec
FEF 25-75 Pre: 2.09 L/sec
FEF2575-%Change-Post: 2 %
FEF2575-%Pred-Post: 122 %
FEF2575-%Pred-Pre: 119 %
FEV1-%Change-Post: 3 %
FEV1-%Pred-Post: 107 %
FEV1-%Pred-Pre: 103 %
FEV1-Post: 2.27 L
FEV1-Pre: 2.19 L
FEV1FVC-%Change-Post: 2 %
FEV1FVC-%Pred-Pre: 101 %
FEV6-%Change-Post: 3 %
FEV6-%Pred-Post: 107 %
FEV6-%Pred-Pre: 104 %
FEV6-Post: 2.88 L
FEV6-Pre: 2.78 L
FEV6FVC-%Pred-Post: 104 %
FEV6FVC-%Pred-Pre: 104 %
FVC-%Change-Post: 0 %
FVC-%Pred-Post: 102 %
FVC-%Pred-Pre: 101 %
FVC-Post: 2.88 L
FVC-Pre: 2.86 L
Post FEV1/FVC ratio: 79 %
Post FEV6/FVC ratio: 100 %
Pre FEV1/FVC ratio: 76 %
Pre FEV6/FVC Ratio: 100 %
RV % pred: 105 %
RV: 2.3 L
TLC % pred: 104 %
TLC: 5.13 L

## 2021-09-15 NOTE — Progress Notes (Signed)
Laura Hopkins    [id number]    [birth year]  Primary Care Physician:Lowne Cheri Rous Alferd Apa, DO  Referring Physician: Carollee Herter, Alferd Apa, DO [address] [place],  [place] [address]  Chief complaint: Follow-up for asthma  HPI: 75 year old with history of OSA on CPAP, depression, hypertension, hysterectomy  C/O cough for the past 6 years. Originally thought to be secondary to ARB which was held Cough is non productive in nature with wheezing, congestion in morning. Dyspnea is increased with cold air. Started on singulair around summer 2022 with improvement but developed nausea so stopped.  Now she has return of cough  She has been prescribed Flovent which she uses only intermittently  Has occasional GERD for which she takes tums, has allergic rhinitis and pos nasal drip. H/O deviated septum s/p surgery H/O OSA, stopped CPAP in 2021.   Pets: Two dogs Occupation:Retired from postal service Exposures:No mold, hot tub jaccuzi, no down pillows or comforter Smoking history: Never smoker.  Travel history: No travel Relevant family history: No family history of lung disease  Interim history: She started on Symbicort at last visit which has improved her cough.  She feels well with no complaints  Recently evaluated by Dr. Silverio Decamp, GI for abdominal discomfort Continues on PPI.  EGD done which I reviewed showing hiatal hernia and normal stomach She has been scheduled for HIDA scan for evaluation of gallbladder  Outpatient Encounter Medications as of 09/15/2021  Medication Sig   acetaminophen (TYLENOL) 500 MG tablet Take 500 mg by mouth every 6 (six) hours as needed.   albuterol (VENTOLIN HFA) 108 (90 Base) MCG/ACT inhaler Inhale 2 puffs into the lungs every 6 (six) hours as needed for wheezing or shortness of breath.   budesonide-formoterol (SYMBICORT) 160-4.5 MCG/ACT inhaler Inhale 2 puffs into the lungs in the morning and at bedtime.   famotidine (PEPCID)  20 MG tablet Take 1 tablet (20 mg total) by mouth at bedtime.   ondansetron (ZOFRAN) 8 MG tablet Take 1 tablet (8 mg total) by mouth every 8 (eight) hours as needed for nausea or vomiting.   pantoprazole (PROTONIX) 40 MG tablet Take 1 tablet (40 mg total) by mouth 2 (two) times daily.   spironolactone (ALDACTONE) 25 MG tablet TAKE 1 TABLET BY MOUTH EVERY DAY   SUMAtriptan (IMITREX) 100 MG tablet TAKE 1 TABLET BY MOUTH DAILY AS NEEDED FOR MIGRAINE.   temazepam (RESTORIL) 30 MG capsule TAKE 1 CAPSULE BY MOUTH AT BEDTIME AS NEEDED FOR SLEEP   fluticasone (FLOVENT HFA) 44 MCG/ACT inhaler Inhale 2 puffs into the lungs 2 (two) times daily. (Patient not taking: Reported on 07/28/2021)   promethazine (PHENERGAN) 25 MG tablet Take 25 mg by mouth as needed. (Patient not taking: Reported on 09/08/2021)   No facility-administered encounter medications on file as of 09/15/2021.   Physical Exam: Blood pressure 110/60, pulse 66, temperature 98.1 F (36.7 C), temperature source Oral, height 5\' 3"  (1.6 m), weight 153 lb (69.4 kg), SpO2 99 %. Gen:      No acute distress HEENT:  EOMI, sclera anicteric Neck:     No masses; no thyromegaly Lungs:    Clear to auscultation bilaterally; normal respiratory effort CV:         Regular rate and rhythm; no murmurs Abd:      + bowel sounds; soft, non-tender; no palpable masses, no distension Ext:    No edema; adequate peripheral perfusion Skin:  Warm and dry; no rash Neuro: alert and oriented x 3 Psych: normal mood and affect   Data Reviewed: Imaging: Chest x-ray 11/07/2019-no acute cardiopulmonary abnormality I have reviewed the images personally.  PFTs: 09/15/2021 FVC 2.88 (22%), FEV1 2.27 [107%], F/F 79, TLC 5.13 [104%], DLCO 19.72 [95%] Normal test  Labs: CBC 04/30/2021-WBC 5.9, eos 5.3%, absolute eosinophil count 313 CBC 08/05/2021-WBC 7.3, eos 2.1%, absolute eosinophil count 153  Assessment:  Moderate persistent asthma CBC reviewed with peripheral  eosinophils She did have a good response to Singulair but developed nausea Now on Symbicort with improvement in symptoms  PFTs show normal lung function  GERD Continue on PPI  Plan/Recommendations: Continue Symbicort  Marshell Garfinkel MD [facility] 09/15/2021, 4:12 PM  CC: Ann Held, *

## 2021-09-15 NOTE — Progress Notes (Signed)
Full PFT performed today. °

## 2021-09-15 NOTE — Patient Instructions (Signed)
I am glad you are doing better with regard to your breathing Continue Symbicort Your lung function test today look normal which is good news  Return to clinic in 6 months

## 2021-09-15 NOTE — Patient Instructions (Signed)
Full PFT performed today. °

## 2021-09-21 ENCOUNTER — Other Ambulatory Visit: Payer: Self-pay | Admitting: Family Medicine

## 2021-09-21 DIAGNOSIS — Z8619 Personal history of other infectious and parasitic diseases: Secondary | ICD-10-CM

## 2021-09-22 ENCOUNTER — Other Ambulatory Visit: Payer: Self-pay

## 2021-09-22 ENCOUNTER — Encounter (HOSPITAL_COMMUNITY)
Admission: RE | Admit: 2021-09-22 | Discharge: 2021-09-22 | Disposition: A | Discharge status: Home / Self Care | Payer: Medicare HMO | Source: Ambulatory Visit | Attending: Gastroenterology | Admitting: Gastroenterology

## 2021-09-22 DIAGNOSIS — R1011 Right upper quadrant pain: Secondary | ICD-10-CM | POA: Diagnosis not present

## 2021-09-22 DIAGNOSIS — R112 Nausea with vomiting, unspecified: Secondary | ICD-10-CM

## 2021-09-22 MED ORDER — TECHNETIUM TC 99M MEBROFENIN IV KIT
5.3000 | PACK | Freq: Once | INTRAVENOUS | Status: AC
  Administered 2021-09-22: 09:00:00 5.3 via INTRAVENOUS

## 2021-09-29 ENCOUNTER — Other Ambulatory Visit: Payer: Self-pay | Admitting: Family Medicine

## 2021-09-29 ENCOUNTER — Encounter: Payer: Self-pay | Admitting: Family Medicine

## 2021-09-30 ENCOUNTER — Other Ambulatory Visit: Payer: Self-pay | Admitting: Family Medicine

## 2021-09-30 MED ORDER — VALACYCLOVIR HCL 1 G PO TABS: ORAL_TABLET | ORAL | 1 refills | Status: DC

## 2021-10-01 ENCOUNTER — Telehealth (INDEPENDENT_AMBULATORY_CARE_PROVIDER_SITE_OTHER): Payer: Medicare HMO | Admitting: Family

## 2021-10-01 ENCOUNTER — Encounter: Payer: Self-pay | Admitting: Family

## 2021-10-01 VITALS — BP 119/72 | HR 86 | Temp 98.0°F | Ht 64.0 in | Wt 150.0 lb

## 2021-10-01 DIAGNOSIS — U071 COVID-19: Secondary | ICD-10-CM | POA: Diagnosis not present

## 2021-10-01 DIAGNOSIS — R52 Pain, unspecified: Secondary | ICD-10-CM | POA: Diagnosis not present

## 2021-10-01 MED ORDER — PROMETHAZINE-DM 6.25-15 MG/5ML PO SYRP: 5.0000 mL | ORAL_SOLUTION | Freq: Three times a day (TID) | ORAL | 0 refills | Status: DC | PRN

## 2021-10-01 MED ORDER — MOLNUPIRAVIR EUA 200MG CAPSULE: 4.0000 | ORAL_CAPSULE | Freq: Two times a day (BID) | ORAL | 0 refills | Status: AC

## 2021-10-04 NOTE — Progress Notes (Signed)
Virtual Visit via Video   I connected with patient on 10/04/21 at 11:20 AM EST by a video enabled telemedicine application and verified that I am speaking with the correct person using two identifiers.  Location patient: Home Location provider: [employer], Office Persons participating in the virtual visit: Patient, Provider, CMA  I discussed the limitations of evaluation and management by telemedicine and the availability of in person appointments. The patient expressed understanding and agreed to proceed.  Subjective:   HPI:   76 year old female presents via video visit with concerns of sore throat, headache, ear pain, body aches, nausea, and cough x3 days. She has tested positive for COVID-19. Her husband is positive as well. She is vaccinated and boosted. No SOB  ROS:   See pertinent positives and negatives per HPI.  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   Coughing 12/24/2019   Bronchitis 11/06/2019   SOB (shortness of breath) 03/11/2019   Insomnia 12/23/2017   Lower extremity edema 07/31/2017   Migraine 05/23/2017   SINUSITIS - ACUTE-NOS 03/17/2009   ACUTE PHARYNGITIS 02/26/2009   SKIN CANCER, HX OF 01/08/2008   Essential hypertension 02/07/2007   URI 02/07/2007   HEADACHE 02/07/2007    Social History   Tobacco Use   Smoking status: Former    Packs/day: 0.30    Types: Cigarettes    Quit date: 03/07/1999    Years since quitting: 22.5   Smokeless tobacco: Never  Substance Use Topics    Alcohol use: No    Alcohol/week: 0.0 standard drinks    Current Outpatient Medications:    acetaminophen (TYLENOL) 500 MG tablet, Take 500 mg by mouth every 6 (six) hours as needed., Disp: , Rfl:    albuterol (VENTOLIN HFA) 108 (90 Base) MCG/ACT inhaler, Inhale 2 puffs into the lungs every 6 (six) hours as needed for wheezing or shortness of breath., Disp: 18 g, Rfl: 2   budesonide-formoterol (SYMBICORT) 160-4.5 MCG/ACT inhaler, Inhale 2 puffs into the lungs in the morning and at bedtime., Disp: 10.2 g, Rfl: 5   famotidine (PEPCID) 20 MG tablet, Take 1 tablet (20 mg total) by mouth at bedtime., Disp: 30 tablet, Rfl: 2   fluticasone (FLOVENT HFA) 44 MCG/ACT inhaler, Inhale 2 puffs into the lungs 2 (two) times daily., Disp: 3 each, Rfl: 1   molnupiravir EUA (LAGEVRIO) 200 mg CAPS capsule, Take 4 capsules (800 mg total) by mouth 2 (two) times daily for 5 days., Disp: 40 capsule, Rfl: 0   ondansetron (ZOFRAN) 8 MG tablet, Take 1 tablet (8 mg total) by mouth every 8 (eight) hours as needed for nausea or vomiting., Disp: 21 tablet, Rfl: 0   pantoprazole (PROTONIX) 40 MG tablet, Take 1 tablet (40 mg total) by mouth 2 (two) times daily., Disp: 90 tablet, Rfl: 3   promethazine (PHENERGAN) 25 MG tablet, Take 25 mg by mouth as needed., Disp: , Rfl:    promethazine-dextromethorphan (PROMETHAZINE-DM) 6.25-15 MG/5ML syrup, Take 5 mLs by mouth 3 (three) times daily as needed., Disp: 118 mL, Rfl: 0   spironolactone (  ALDACTONE) 25 MG tablet, TAKE 1 TABLET BY MOUTH EVERY DAY, Disp: 90 tablet, Rfl: 3   SUMAtriptan (IMITREX) 100 MG tablet, TAKE 1 TABLET BY MOUTH DAILY AS NEEDED FOR MIGRAINE., Disp: 9 tablet, Rfl: 4   temazepam (RESTORIL) 30 MG capsule, TAKE 1 CAPSULE BY MOUTH AT BEDTIME AS NEEDED FOR SLEEP, Disp: 30 capsule, Rfl: 0   valACYclovir (VALTREX) 1000 MG tablet, Take 2 tabs and repeat in 12 hours in event of an outbreak., Disp: 30 tablet, Rfl: 1  Allergies  Allergen Reactions   Avapro [Irbesartan] Cough    Codeine Other (See Comments)    Makes pt hyper    Metoprolol Cough    Metallic taste , nasal drainage    Norvasc [Amlodipine Besylate] Swelling    Objective:   BP 119/72 (BP Location: Left Arm, Patient Position: Sitting, Cuff Size: Normal)    Pulse 86    Temp 98 F (36.7 C) (Oral)    Ht 5\' 4"  (1.626 m)    Wt 150 lb (68 kg)    SpO2 96%    BMI 25.75 kg/m   Patient is well-developed, well-nourished in no acute distress.  Resting comfortably at home.  Head is normocephalic, atraumatic.  No labored breathing.  Speech is clear and coherent with logical content.  Patient is alert and oriented at baseline.    Assessment and Plan:   Laura Hopkins was seen today for acute visit.  Diagnoses and all orders for this visit:  COVID-19  Body aches  Other orders -     molnupiravir EUA (LAGEVRIO) 200 mg CAPS capsule; Take 4 capsules (800 mg total) by mouth 2 (two) times daily for 5 days. -     promethazine-dextromethorphan (PROMETHAZINE-DM) 6.25-15 MG/5ML syrup; Take 5 mLs by mouth 3 (three) times daily as needed.    Call the office if symptoms worsen or persist. Recheck as scheduled and sooner as needed. COVID-19 precaution provided.   Kennyth Arnold, FNP 10/04/2021

## 2021-10-07 ENCOUNTER — Ambulatory Visit: Payer: Medicare HMO | Admitting: Pulmonary Disease

## 2021-10-12 ENCOUNTER — Telehealth: Payer: Self-pay | Admitting: Family Medicine

## 2021-10-12 ENCOUNTER — Other Ambulatory Visit: Payer: Self-pay | Admitting: Family Medicine

## 2021-10-12 DIAGNOSIS — J40 Bronchitis, not specified as acute or chronic: Secondary | ICD-10-CM

## 2021-10-12 MED ORDER — AMOXICILLIN-POT CLAVULANATE 875-125 MG PO TABS: 1.0000 | ORAL_TABLET | Freq: Two times a day (BID) | ORAL | 0 refills | Status: DC

## 2021-10-12 NOTE — Telephone Encounter (Signed)
Patient notified

## 2021-10-12 NOTE — Telephone Encounter (Signed)
Pt called and stated she is still having cough, congestion, and headaches after covid, she wants to know if there is something she can take or be prescribed to get rid of it completely.

## 2021-10-12 NOTE — Telephone Encounter (Signed)
Pt seen virtually on 10/01/21 for COVID. Pt given anti-viral and promethazine cough med. Please advise

## 2021-10-18 ENCOUNTER — Other Ambulatory Visit: Payer: Self-pay | Admitting: Family Medicine

## 2021-10-18 DIAGNOSIS — G43809 Other migraine, not intractable, without status migrainosus: Secondary | ICD-10-CM

## 2021-11-12 ENCOUNTER — Ambulatory Visit: Payer: Medicare HMO | Admitting: Gastroenterology

## 2021-11-12 ENCOUNTER — Other Ambulatory Visit: Payer: Self-pay | Admitting: Family Medicine

## 2021-11-12 ENCOUNTER — Encounter: Payer: Self-pay | Admitting: Gastroenterology

## 2021-11-12 VITALS — BP 116/76 | HR 66 | Ht 64.0 in | Wt 154.2 lb

## 2021-11-12 DIAGNOSIS — K589 Irritable bowel syndrome without diarrhea: Secondary | ICD-10-CM

## 2021-11-12 DIAGNOSIS — K219 Gastro-esophageal reflux disease without esophagitis: Secondary | ICD-10-CM

## 2021-11-12 DIAGNOSIS — R1011 Right upper quadrant pain: Secondary | ICD-10-CM | POA: Diagnosis not present

## 2021-11-12 DIAGNOSIS — G4700 Insomnia, unspecified: Secondary | ICD-10-CM

## 2021-11-12 DIAGNOSIS — K760 Fatty (change of) liver, not elsewhere classified: Secondary | ICD-10-CM

## 2021-11-12 DIAGNOSIS — I10 Essential (primary) hypertension: Secondary | ICD-10-CM

## 2021-11-12 NOTE — Progress Notes (Signed)
Laura Hopkins    [id number]    06-26-1946  Primary Care Physician:Lowne Cheri Rous Alferd Apa, DO  Referring Physician: Carollee Herter, Alferd Apa, DO [address] [place],  [place] [address]   Chief complaint: GERD, nausea, RUQ abd pain  HPI:  76 year old very pleasant female here for follow-up visit for GERD and abdominal pain.  Overall she feels her symptoms have significantly improved   She is currently taking omeprazole and pantoprazole. Denies any reflux symptoms, heartburn or vomiting.  She continues to have intermittent episodes of nausea and right upper quadrant discomfort but overall much improved Her bowel habits are at baseline.  Denies any melena or rectal bleeding.  HIDA scan September 22, 2021: Normal exam   Abdominal ultrasound 08/02/21 The echogenicity of the liver is increased. This is a nonspecific finding but is most commonly seen with fatty infiltration of the liver. There are no obvious focal liver lesions.   Colonoscopy  04/12/2016 - Diverticulosis in the sigmoid colon and in the descending colon. - Non-bleeding internal hemorrhoids.   EGD September 08, 2021: Small hiatal hernia otherwise normal   Outpatient Encounter Medications as of 11/12/2021  Medication Sig   acetaminophen (TYLENOL) 500 MG tablet Take 500 mg by mouth every 6 (six) hours as needed.   albuterol (VENTOLIN HFA) 108 (90 Base) MCG/ACT inhaler Inhale 2 puffs into the lungs every 6 (six) hours as needed for wheezing or shortness of breath.   budesonide-formoterol (SYMBICORT) 160-4.5 MCG/ACT inhaler Inhale 2 puffs into the lungs in the morning and at bedtime.   [employer] FIBER GUMMIES PO Take 2 tablets by mouth daily. + B Vitamins   estradiol (VIVELLE-DOT) 0.05 MG/24HR patch Place onto the skin 2 (two) times a week.   fluticasone (FLOVENT HFA) 44 MCG/ACT inhaler Inhale 2 puffs into the lungs 2 (two) times daily.   omeprazole (PRILOSEC) 40 MG capsule Take 40 mg by mouth daily.    pantoprazole (PROTONIX) 40 MG tablet Take 1 tablet (40 mg total) by mouth 2 (two) times daily.   promethazine (PHENERGAN) 25 MG tablet Take 25 mg by mouth as needed.   spironolactone (ALDACTONE) 25 MG tablet TAKE 1 TABLET BY MOUTH EVERY DAY   SUMAtriptan (IMITREX) 100 MG tablet TAKE 1 TABLET BY MOUTH DAILY AS NEEDED FOR MIGRAINE.   temazepam (RESTORIL) 30 MG capsule TAKE 1 CAPSULE BY MOUTH AT BEDTIME AS NEEDED FOR SLEEP   valACYclovir (VALTREX) 1000 MG tablet Take 2 tabs and repeat in 12 hours in event of an outbreak.   [DISCONTINUED] amoxicillin-clavulanate (AUGMENTIN) 875-125 MG tablet Take 1 tablet by mouth 2 (two) times daily. (Patient not taking: Reported on 11/12/2021)   [DISCONTINUED] famotidine (PEPCID) 20 MG tablet Take 1 tablet (20 mg total) by mouth at bedtime. (Patient not taking: Reported on 11/12/2021)   [DISCONTINUED] ondansetron (ZOFRAN) 8 MG tablet Take 1 tablet (8 mg total) by mouth every 8 (eight) hours as needed for nausea or vomiting. (Patient not taking: Reported on 11/12/2021)   [DISCONTINUED] promethazine-dextromethorphan (PROMETHAZINE-DM) 6.25-15 MG/5ML syrup Take 5 mLs by mouth 3 (three) times daily as needed. (Patient not taking: Reported on 11/12/2021)   No facility-administered encounter medications on file as of 11/12/2021.    Allergies as of 11/12/2021 - Review Complete 11/12/2021  Allergen Reaction Noted   Avapro [irbesartan] Cough 02/12/2018   Codeine Other (See Comments) 03/23/2016   Metoprolol Cough 05/31/2018  Norvasc [amlodipine besylate] Swelling 05/31/2018    Past Medical History:  Diagnosis Date   Acute pharyngitis 02/26/2009   Qualifier: Diagnosis of  By: Jerold Coombe     Arthritis    hands and neck    Asthma    Essential hypertension 02/07/2007   Qualifier: Diagnosis of  By: Deeann Saint)    HTN (hypertension)    Insomnia 12/23/2017   Lower extremity edema 07/31/2017   Migraine 05/23/2017   Pneumonia    SINUSITIS -  ACUTE-NOS 03/17/2009   Qualifier: Diagnosis of  By: Jerold Coombe     Skin cancer    basal cell   SKIN CANCER, HX OF 01/08/2008   Annotation: BSC and SCC Qualifier: Diagnosis of  By: Jerold Coombe   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 Amy Martinique    URI 02/07/2007   Qualifier: Diagnosis of  By: Jerold Coombe      Past Surgical History:  Procedure Laterality Date   ABDOMINAL HYSTERECTOMY  1995   BREAST ENHANCEMENT SURGERY Bilateral    BREAST IMPLANT REMOVAL Bilateral    with breast lift   CATARACT EXTRACTION W/ INTRAOCULAR LENS IMPLANT Bilateral 03/03/2017   cataract sx with lens implant   COLONOSCOPY  2007   colon--negative   NASAL SEPTUM SURGERY  1983   ROTATOR CUFF REPAIR Right 2000   TUBAL LIGATION  1978   WISDOM TOOTH EXTRACTION      Family History  Problem Relation Age of Onset   Hypertension Mother    Dementia Mother    Diabetes Father    Hypertension Father    Arthritis Father    Prostate cancer Father    Melanoma Brother    Colon cancer Neg Hx    Colon polyps Neg Hx    Esophageal cancer Neg Hx    Rectal cancer Neg Hx    Stomach cancer Neg Hx     Social History   Socioeconomic History   Marital status: Married    Spouse name: Not on file   Number of children: 2   Years of education: Not on file   Highest education level: Not on file  Occupational History   Occupation: Public affairs consultant: UNEMPLOYED    Comment: retired  Tobacco Use   Smoking status: Former    Packs/day: 0.30    Types: Cigarettes    Quit date: 03/07/1999    Years since quitting: 22.7   Smokeless tobacco: Never  Vaping Use   Vaping Use: Never used  Substance and Sexual Activity   Alcohol use: No    Alcohol/week: 0.0 standard drinks   Drug use: No   Sexual activity: Never    Partners: Male  Other Topics Concern   Not on file  Social History Narrative   Not on file   Social Determinants of Health   Financial Resource Strain: Not on  file  Food Insecurity: Not on file  Transportation Needs: Not on file  Physical Activity: Not on file  Stress: Not on file  Social Connections: Not on file  Intimate Partner Violence: Not on file      Review of systems: All other review of systems negative except as mentioned in the HPI.   Physical Exam: There were no vitals filed for this visit. There is no height or weight on file to calculate BMI. Gen:      No acute distress HEENT:  sclera anicteric Abd:      soft, non-tender; no palpable masses, no distension Ext:    No edema Neuro: alert and oriented x 3 Psych: normal mood and affect  Data Reviewed:  Reviewed labs, radiology imaging, old records and pertinent past GI work up   Assessment and Plan/Recommendations:  76 year old very pleasant female with history of hypertension, OSA, bronchitis and chronic GERD  Abdominal discomfort: Improving HIDA scan negative for gallbladder dysfunction or cholecystitis  Fatty liver: Continue with dietary modifications and exercise LFT within normal range   GERD: Improved Decrease pantoprazole to 40 mg daily DC omeprazole  Discussed benefits and side effects associated with long-term high-dose PPI  Antireflux measures   She is up-to-date with colorectal cancer screening  Return in 1 year or sooner if needed  This visit required 30 minutes of patient care (this includes precharting, chart review, review of results, face-to-face time used for counseling as well as treatment plan and follow-up. The patient was provided an opportunity to ask questions and all were answered. The patient agreed with the plan and demonstrated an understanding of the instructions.  Damaris Hippo , MD    CC: Carollee Herter, Alferd Apa, *

## 2021-11-12 NOTE — Patient Instructions (Signed)
Decrease Pantoprazole 40 mg daily  STOP Omeprazole  Take fiber Gastroesophageal Reflux Disease, Adult Gastroesophageal reflux (GER) happens when acid from the stomach flows up into the tube that connects the mouth and the stomach (esophagus). Normally, food travels down the esophagus and stays in the stomach to be digested. However, when a person has GER, food and stomach acid sometimes move back up into the esophagus. If this becomes a more serious problem, the person may be diagnosed with a disease called gastroesophageal reflux disease (GERD). GERD occurs when the reflux: Happens often. Causes frequent or severe symptoms. Causes problems such as damage to the esophagus. When stomach acid comes in contact with the esophagus, the acid may cause inflammation in the esophagus. Over time, GERD may create small holes (ulcers) in the lining of the esophagus. What are the causes? This condition is caused by a problem with the muscle between the esophagus and the stomach (lower esophageal sphincter, or LES). Normally, the LES muscle closes after food passes through the esophagus to the stomach. When the LES is weakened or abnormal, it does not close properly, and that allows food and stomach acid to go back up into the esophagus. The LES can be weakened by certain dietary substances, medicines, and medical conditions, including: Tobacco use. Pregnancy. Having a hiatal hernia. Alcohol use. Certain foods and beverages, such as coffee, chocolate, onions, and peppermint. What increases the risk? You are more likely to develop this condition if you: Have an increased body weight. Have a connective tissue disorder. Take NSAIDs, such as ibuprofen. What are the signs or symptoms? Symptoms of this condition include: Heartburn. Difficult or painful swallowing and the feeling of having a lump in the throat. A bitter taste in the mouth. Bad breath and having a large amount of saliva. Having an upset or  bloated stomach and belching. Chest pain. Different conditions can cause chest pain. Make sure you see your health care provider if you experience chest pain. Shortness of breath or wheezing. Ongoing (chronic) cough or a nighttime cough. Wearing away of tooth enamel. Weight loss. How is this diagnosed? This condition may be diagnosed based on a medical history and a physical exam. To determine if you have mild or severe GERD, your health care provider may also monitor how you respond to treatment. You may also have tests, including: A test to examine your stomach and esophagus with a small camera (endoscopy). A test that measures the acidity level in your esophagus. A test that measures how much pressure is on your esophagus. A barium swallow or modified barium swallow test to show the shape, size, and functioning of your esophagus. How is this treated? Treatment for this condition may vary depending on how severe your symptoms are. Your health care provider may recommend: Changes to your diet. Medicine. Surgery. The goal of treatment is to help relieve your symptoms and to prevent complications. Follow these instructions at home: Eating and drinking  Follow a diet as recommended by your health care provider. This may involve avoiding foods and drinks such as: Coffee and tea, with or without caffeine. Drinks that contain alcohol. Energy drinks and sports drinks. Carbonated drinks or sodas. Chocolate and cocoa. Peppermint and mint flavorings. Garlic and onions. Horseradish. Spicy and acidic foods, including peppers, chili powder, curry powder, vinegar, hot sauces, and barbecue sauce. Citrus fruit juices and citrus fruits, such as oranges, lemons, and limes. Tomato-based foods, such as red sauce, chili, salsa, and pizza with red sauce. Fried and fatty  foods, such as donuts, french fries, potato chips, and high-fat dressings. High-fat meats, such as hot dogs and fatty cuts of red and  white meats, such as rib eye steak, sausage, ham, and bacon. High-fat dairy items, such as whole milk, butter, and cream cheese. Eat small, frequent meals instead of large meals. Avoid drinking large amounts of liquid with your meals. Avoid eating meals during the 2-3 hours before bedtime. Avoid lying down right after you eat. Do not exercise right after you eat. Lifestyle  Do not use any products that contain nicotine or tobacco. These products include cigarettes, chewing tobacco, and vaping devices, such as e-cigarettes. If you need help quitting, ask your health care provider. Try to reduce your stress by using methods such as yoga or meditation. If you need help reducing stress, ask your health care provider. If you are overweight, reduce your weight to an amount that is healthy for you. Ask your health care provider for guidance about a safe weight loss goal. General instructions Pay attention to any changes in your symptoms. Take over-the-counter and prescription medicines only as told by your health care provider. Do not take aspirin, ibuprofen, or other NSAIDs unless your health care provider told you to take these medicines. Wear loose-fitting clothing. Do not wear anything tight around your waist that causes pressure on your abdomen. Raise (elevate) the head of your bed about 6 inches (15 cm). You can use a wedge to do this. Avoid bending over if this makes your symptoms worse. Keep all follow-up visits. This is important. Contact a health care provider if: You have: New symptoms. Unexplained weight loss. Difficulty swallowing or it hurts to swallow. Wheezing or a persistent cough. A hoarse voice. Your symptoms do not improve with treatment. Get help right away if: You have sudden pain in your arms, neck, jaw, teeth, or back. You suddenly feel sweaty, dizzy, or light-headed. You have chest pain or shortness of breath. You vomit and the vomit is green, yellow, or black, or it  looks like blood or coffee grounds. You faint. You have stool that is red, bloody, or black. You cannot swallow, drink, or eat. These symptoms may represent a serious problem that is an emergency. Do not wait to see if the symptoms will go away. Get medical help right away. Call your local emergency services (911 in the [place] Do not drive yourself to the hospital. Summary Gastroesophageal reflux happens when acid from the stomach flows up into the esophagus. GERD is a disease in which the reflux happens often, causes frequent or severe symptoms, or causes problems such as damage to the esophagus. Treatment for this condition may vary depending on how severe your symptoms are. Your health care provider may recommend diet and lifestyle changes, medicine, or surgery. Contact a health care provider if you have new or worsening symptoms. Take over-the-counter and prescription medicines only as told by your health care provider. Do not take aspirin, ibuprofen, or other NSAIDs unless your health care provider told you to do so. Keep all follow-up visits as told by your health care provider. This is important. This information is not intended to replace advice given to you by your health care provider. Make sure you discuss any questions you have with your health care provider. Document Revised: 03/30/2020 Document Reviewed: 03/30/2020 Elsevier Patient Education  [place]. with breakfast and lunch   Follow up in 6 months  If you are age 90 or older, your body mass index should  be between 23-30. Your Body mass index is 26.48 kg/m. If this is out of the aforementioned range listed, please consider follow up with your Primary Care Provider.  If you are age 49 or younger, your body mass index should be between 19-25. Your Body mass index is 26.48 kg/m. If this is out of the aformentioned range listed, please consider follow up with your Primary Care Provider.    ________________________________________________________  The [facility] providers would like to encourage you to use [facility] to communicate with providers for non-urgent requests or questions.  Due to long hold times on the telephone, sending your provider a message by [facility] may be a faster and more efficient way to get a response.  Please allow 48 business hours for a response.  Please remember that this is for non-urgent requests.  _______________________________________________________   I appreciate the  opportunity to care for you  Thank You   Harl Bowie , MD

## 2021-11-12 NOTE — Telephone Encounter (Signed)
[facility] Pt  Requesting: temazepam 30mg   Contract:  02/12/2018 UDS: 02/12/2018 Last Visit: 07/12/2021 Next Visit: None Last Refill: 08/19/2021 #30 and 0RF  Please Advise

## 2021-11-15 ENCOUNTER — Ambulatory Visit (INDEPENDENT_AMBULATORY_CARE_PROVIDER_SITE_OTHER): Payer: Medicare HMO

## 2021-11-15 VITALS — Ht 64.0 in | Wt 154.0 lb

## 2021-11-15 DIAGNOSIS — Z0000 Encounter for general adult medical examination without abnormal findings: Secondary | ICD-10-CM | POA: Diagnosis not present

## 2021-11-15 NOTE — Progress Notes (Signed)
Subjective:   Laura Hopkins is a 76 y.o. female who presents for Medicare Annual (Subsequent) preventive examination.  I connected with Kimberlie today by telephone and verified that I am speaking with the correct person using two identifiers. Location patient: home Location provider: work Persons participating in the virtual visit: patient, Marine scientist.    I discussed the limitations, risks, security and privacy concerns of performing an evaluation and management service by telephone and the availability of in person appointments. I also discussed with the patient that there may be a patient responsible charge related to this service. The patient expressed understanding and verbally consented to this telephonic visit.    Interactive audio and video telecommunications were attempted between this provider and patient, however failed, due to patient having technical difficulties OR patient did not have access to video capability.  We continued and completed visit with audio only.  Some vital signs may be absent or patient reported.   Time Spent with patient on telephone encounter: 20 minutes   Review of Systems     Cardiac Risk Factors include: hypertension;advanced age (>69men, >67 women);dyslipidemia     Objective:    Today's Vitals   11/15/21 1301  Weight: 154 lb (69.9 kg)  Height: 5\' 4"  (1.626 m)   Body mass index is 26.43 kg/m.  Advanced Directives 11/15/2021 09/01/2020 08/14/2017 08/08/2016 04/12/2016 03/23/2016 08/06/2015  Does Patient Have a Medical Advance Directive? Yes Yes Yes Yes Yes Yes No  Type of Paramedic of [place];Living will [place];Living will [place];Living will Living will;Healthcare Power of Attorney Living will;Healthcare Power of Attorney -  Does patient want to make changes to medical advance directive? - - - - - - Yes - information given  Copy of [place] in Chart? Yes - validated most recent copy scanned in chart (See row information) - Yes No - copy requested No - copy requested - -  Would patient like information on creating a medical advance directive? - No - Patient declined - - - - -    Current Medications (verified) Outpatient Encounter Medications as of 11/15/2021  Medication Sig   acetaminophen (TYLENOL) 500 MG tablet Take 500 mg by mouth every 6 (six) hours as needed.   albuterol (VENTOLIN HFA) 108 (90 Base) MCG/ACT inhaler Inhale 2 puffs into the lungs every 6 (six) hours as needed for wheezing or shortness of breath.   budesonide-formoterol (SYMBICORT) 160-4.5 MCG/ACT inhaler Inhale 2 puffs into the lungs in the morning and at bedtime.   [employer] FIBER GUMMIES PO Take 2 tablets by mouth daily. + B Vitamins   estradiol (VIVELLE-DOT) 0.05 MG/24HR patch Place onto the skin 2 (two) times a week.   fluticasone (FLOVENT HFA) 44 MCG/ACT inhaler Inhale 2 puffs into the lungs 2 (two) times daily.   pantoprazole (PROTONIX) 40 MG tablet Take 1 tablet (40 mg total) by mouth 2 (two) times daily.   promethazine (PHENERGAN) 25 MG tablet Take 25 mg by mouth as needed.   spironolactone (ALDACTONE) 25 MG tablet TAKE 1 TABLET BY MOUTH EVERY DAY   SUMAtriptan (IMITREX) 100 MG tablet TAKE 1 TABLET BY MOUTH DAILY AS NEEDED FOR MIGRAINE.   temazepam (RESTORIL) 30 MG capsule TAKE 1 CAPSULE BY MOUTH AT BEDTIME AS NEEDED FOR SLEEP   valACYclovir (VALTREX) 1000 MG tablet Take 2 tabs and repeat in 12 hours in event of an outbreak.   omeprazole (PRILOSEC) 40 MG capsule  Take 40 mg by mouth daily.   No facility-administered encounter medications on file as of 11/15/2021.    Allergies (verified) Avapro [irbesartan], Codeine, Metoprolol, and Norvasc [amlodipine besylate]   History: Past Medical History:  Diagnosis Date   Acute pharyngitis 02/26/2009   Qualifier: Diagnosis of  By: Jerold Coombe     Arthritis    hands and neck    Asthma    Essential  hypertension 02/07/2007   Qualifier: Diagnosis of  By: Deeann Saint)    HTN (hypertension)    Insomnia 12/23/2017   Lower extremity edema 07/31/2017   Migraine 05/23/2017   Pneumonia    SINUSITIS - ACUTE-NOS 03/17/2009   Qualifier: Diagnosis of  By: Jerold Coombe     Skin cancer    basal cell   SKIN CANCER, HX OF 01/08/2008   Annotation: BSC and SCC Qualifier: Diagnosis of  By: Jerold Coombe   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 Amy Martinique    URI 02/07/2007   Qualifier: Diagnosis of  By: Jerold Coombe     Past Surgical History:  Procedure Laterality Date   ABDOMINAL HYSTERECTOMY  1995   BREAST ENHANCEMENT SURGERY Bilateral    BREAST IMPLANT REMOVAL Bilateral    with breast lift   CATARACT EXTRACTION W/ INTRAOCULAR LENS IMPLANT Bilateral 03/03/2017   cataract sx with lens implant   COLONOSCOPY  2007   colon--negative   NASAL SEPTUM SURGERY  1983   ROTATOR CUFF REPAIR Right 2000   TUBAL LIGATION  1978   WISDOM TOOTH EXTRACTION     Family History  Problem Relation Age of Onset   Hypertension Mother    Dementia Mother    Diabetes Father    Hypertension Father    Arthritis Father    Prostate cancer Father    Melanoma Brother    Colon cancer Neg Hx    Colon polyps Neg Hx    Esophageal cancer Neg Hx    Rectal cancer Neg Hx    Stomach cancer Neg Hx    Social History   Socioeconomic History   Marital status: Married    Spouse name: Not on file   Number of children: 2   Years of education: Not on file   Highest education level: Not on file  Occupational History   Occupation: Public affairs consultant: UNEMPLOYED    Comment: retired  Tobacco Use   Smoking status: Former    Packs/day: 0.30    Types: Cigarettes    Quit date: 03/07/1999    Years since quitting: 22.7   Smokeless tobacco: Never  Vaping Use   Vaping Use: Never used  Substance and Sexual Activity   Alcohol use: No    Alcohol/week: 0.0  standard drinks   Drug use: No   Sexual activity: Never    Partners: Male  Other Topics Concern   Not on file  Social History Narrative   Not on file   Social Determinants of Health   Financial Resource Strain: Low Risk    Difficulty of Paying Living Expenses: Not hard at all  Food Insecurity: No Food Insecurity   Worried About Charity fundraiser in the Last Year: Never true   Arboriculturist in the Last Year: Never true  Transportation Needs: No Transportation Needs   Lack of Transportation (Medical): No   Lack of Transportation (Non-Medical): No  Physical Activity: Sufficiently Active  Days of Exercise per Week: 5 days   Minutes of Exercise per Session: 30 min  Stress: No Stress Concern Present   Feeling of Stress : Not at all  Social Connections: Moderately Integrated   Frequency of Communication with Friends and Family: More than three times a week   Frequency of Social Gatherings with Friends and Family: More than three times a week   Attends Religious Services: More than 4 times per year   Active Member of [employer] or Organizations: No   Attends Music therapist: Never   Marital Status: Married    Tobacco Counseling Counseling given: Not Answered   Clinical Intake:  Pre-visit preparation completed: Yes        BMI - recorded: 26.43 Nutritional Status: BMI 25 -29 Overweight Nutritional Risks: None Diabetes: No  How often do you need to have someone help you when you read instructions, pamphlets, or other written materials from your doctor or pharmacy?: 1 - Never  Diabetic?No  Interpreter Needed?: No  Information entered by :: Caroleen Hamman LPN   Activities of Daily Living In your present state of health, do you have any difficulty performing the following activities: 11/15/2021 07/12/2021  Hearing? N N  Vision? N N  Difficulty concentrating or making decisions? N N  Walking or climbing stairs? N N  Dressing or bathing? N N  Doing  errands, shopping? N N  Preparing Food and eating ? N -  Using the Toilet? N -  In the past six months, have you accidently leaked urine? N -  Do you have problems with loss of bowel control? N -  Managing your Medications? N -  Managing your Finances? N -  Housekeeping or managing your Housekeeping? N -  Some recent data might be hidden    Patient Care Team: Carollee Herter, Alferd Apa, DO as PCP - General Bettina Gavia Hilton Cork, MD as PCP - Cardiology (Cardiology) Dian Queen, MD as Consulting Physician (Obstetrics and Gynecology) Martinique, Amy, MD as Consulting Physician (Dermatology) Dohmeier, Asencion Partridge, MD as Consulting Physician (Neurology) Day, Melvenia Beam, [facility] (Inactive) as Pharmacist (Pharmacist)  Indicate any recent Medical Services you may have received from other than [facility] providers in the past year (date may be approximate).     Assessment:   This is a routine wellness examination for Kauri.  Hearing/Vision screen Hearing Screening - Comments:: No issues Vision Screening - Comments:: Last eye exam-01/2021-Dr.Parker  Dietary issues and exercise activities discussed: Current Exercise Habits: Home exercise routine, Type of exercise: walking, Time (Minutes): 30, Frequency (Times/Week): 5, Weekly Exercise (Minutes/Week): 150, Intensity: Mild, Exercise limited by: None identified   Goals Addressed             This Visit's Progress    Eat more fruits and vegetables   Not on track    Increase physical activity   On track     Walking        Depression Screen PHQ 2/9 Scores 11/15/2021 07/28/2021 04/30/2021 07/02/2020 02/12/2018 08/14/2017 08/08/2016  PHQ - 2 Score 0 0 0 0 3 0 0  PHQ- 9 Score - 0 - - 10 - -    Fall Risk Fall Risk  11/15/2021 07/28/2021 04/30/2021 07/02/2020 08/14/2017  Falls in the past year? 0 0 0 0 No  Number falls in past yr: 0 0 0 0 -  Injury with Fall? 0 0 0 0 -  Follow up Falls prevention discussed Falls evaluation completed - Falls evaluation completed -  FALL RISK PREVENTION PERTAINING TO THE HOME:  Any stairs in or around the home? No  Home free of loose throw rugs in walkways, pet beds, electrical cords, etc? Yes  Adequate lighting in your home to reduce risk of falls? Yes   ASSISTIVE DEVICES UTILIZED TO PREVENT FALLS:  Life alert? No  Use of a cane, walker or w/c? No  Grab bars in the bathroom? No  Shower chair or bench in shower? Yes  Elevated toilet seat or a handicapped toilet? No   TIMED UP AND GO:  Was the test performed? No . Phone visit   Cognitive Function:Normal cognitive status assessed by this Nurse Health Advisor. No abnormalities found.   MMSE - Mini Mental State Exam 08/14/2017 08/08/2016 08/06/2015  Orientation to time 5 5 5   Orientation to Place 5 5 5   Registration 3 3 3   Attention/ Calculation 5 5 5   Recall 3 3 3   Language- name 2 objects 2 2 2   Language- repeat 1 1 1   Language- follow 3 step command 3 3 3   Language- read & follow direction 1 1 1   Write a sentence 1 1 1   Copy design 1 1 1   Total score 30 30 30         Immunizations Immunization History  Administered Date(s) Administered   Fluad Quad(high Dose 65+) 07/04/2019, 07/02/2020, 07/12/2021   Influenza Split 07/24/2017   Influenza Whole 07/03/2006, 09/11/2007, 07/15/2008, 07/30/2009   Influenza, High Dose Seasonal PF 08/27/2014, 08/06/2015, 07/24/2017   Influenza,inj,Quad PF,6+ Mos 08/12/2013, 07/20/2018   PFIZER(Purple Top)SARS-COV-2 Vaccination 10/24/2019, 11/14/2019, 07/14/2020   Pfizer Covid-19 Vaccine Bivalent Booster 60yrs & up 08/02/2021   Pneumococcal Conjugate-13 10/02/2015   Pneumococcal Polysaccharide-23 03/27/2014   Td 07/02/2020   Zoster Recombinat (Shingrix) 06/18/2018, 08/19/2018    TDAP status: Up to date  Flu Vaccine status: Up to date  Pneumococcal vaccine status: Up to date  Covid-19 vaccine status: Completed vaccines  Qualifies for Shingles Vaccine? No   Zostavax completed No   Shingrix Completed?:  Yes  Screening Tests Health Maintenance  Topic Date Due   MAMMOGRAM  01/07/2022   COLONOSCOPY (Pts 45-32yrs Insurance coverage will need to be confirmed)  04/12/2026   TETANUS/TDAP  07/02/2030   Pneumonia Vaccine 45+ Years old  Completed   INFLUENZA VACCINE  Completed   DEXA SCAN  Completed   COVID-19 Vaccine  Completed   Hepatitis C Screening  Completed   Zoster Vaccines- Shingrix  Completed   HPV VACCINES  Aged Out    Health Maintenance  There are no preventive care reminders to display for this patient.  Colorectal cancer screening: Type of screening: Colonoscopy. Completed 04/12/2016. Repeat every 10 years  Mammogram status: Completed bilateral 01/07/2021. Repeat every year  Bone Density status: Completed 11/26/2020-per patient. Results reflect: Bone density results: OSTEOPENIA. Repeat every 2 years. Requested report.  Lung Cancer Screening: (Low Dose CT Chest recommended if Age 63-80 years, 30 pack-year currently smoking OR have quit w/in 15years.) does not qualify.     Additional Screening:  Hepatitis C Screening: Completed 10/02/2015  Vision Screening: Recommended annual ophthalmology exams for early detection of glaucoma and other disorders of the eye. Is the patient up to date with their annual eye exam?  Yes  Who is the provider or what is the name of the office in which the patient attends annual eye exams? Dr. Jerline Pain   Dental Screening: Recommended annual dental exams for proper oral hygiene  Community Resource Referral / Chronic Care Management: CRR  required this visit?  No   CCM required this visit?  No      Plan:     I have personally reviewed and noted the following in the patients chart:   Medical and social history Use of alcohol, tobacco or illicit drugs  Current medications and supplements including opioid prescriptions.  Functional ability and status Nutritional status Physical activity Advanced directives List of other  physicians Hospitalizations, surgeries, and ER visits in previous 12 months Vitals Screenings to include cognitive, depression, and falls Referrals and appointments  In addition, I have reviewed and discussed with patient certain preventive protocols, quality metrics, and best practice recommendations. A written personalized care plan for preventive services as well as general preventive health recommendations were provided to patient.   Due to this being a telephonic visit, the after visit summary with patients personalized plan was offered to patient via mail or my-chart. Patient would like to access on my-chart.   Marta Antu, LPN   [id number]  Nurse Health Advisor  Nurse Notes: None

## 2021-11-15 NOTE — Patient Instructions (Signed)
Laura Hopkins , Thank you for taking time to complete your Medicare Wellness Visit. I appreciate your ongoing commitment to your health goals. Please review the following plan we discussed and let me know if I can assist you in the future.   Screening recommendations/referrals: Colonoscopy: Completed 04/12/2016-Due 04/12/2026 Mammogram: Completed 01/07/2021-Due 01/07/2022 Bone Density: Completed 11/26/2020-Due 11/26/2022 Recommended yearly ophthalmology/optometry visit for glaucoma screening and checkup Recommended yearly dental visit for hygiene and checkup  Vaccinations: Influenza vaccine: Up to date Pneumococcal vaccine: Up to date Tdap vaccine: Up to date Shingles vaccine: Completed vaccines   Covid-19:Up to date  Advanced directives: Copy in chart  Conditions/risks identified: See problem list  Next appointment: Follow up in one year for your annual wellness visit    Preventive Care 74 Years and Older, Female Preventive care refers to lifestyle choices and visits with your health care provider that can promote health and wellness. What does preventive care include? A yearly physical exam. This is also called an annual well check. Dental exams once or twice a year. Routine eye exams. Ask your health care provider how often you should have your eyes checked. Personal lifestyle choices, including: Daily care of your teeth and gums. Regular physical activity. Eating a healthy diet. Avoiding tobacco and drug use. Limiting alcohol use. Practicing safe sex. Taking low-dose aspirin every day. Taking vitamin and mineral supplements as recommended by your health care provider. What happens during an annual well check? The services and screenings done by your health care provider during your annual well check will depend on your age, overall health, lifestyle risk factors, and family history of disease. Counseling  Your health care provider may ask you questions about your: Alcohol  use. Tobacco use. Drug use. Emotional well-being. Home and relationship well-being. Sexual activity. Eating habits. History of falls. Memory and ability to understand (cognition). Work and work Statistician. Reproductive health. Screening  You may have the following tests or measurements: Height, weight, and BMI. Blood pressure. Lipid and cholesterol levels. These may be checked every 5 years, or more frequently if you are over 88 years old. Skin check. Lung cancer screening. You may have this screening every year starting at age 20 if you have a 30-pack-year history of smoking and currently smoke or have quit within the past 15 years. Fecal occult blood test (FOBT) of the stool. You may have this test every year starting at age 7. Flexible sigmoidoscopy or colonoscopy. You may have a sigmoidoscopy every 5 years or a colonoscopy every 10 years starting at age 48. Hepatitis C blood test. Hepatitis B blood test. Sexually transmitted disease (STD) testing. Diabetes screening. This is done by checking your blood sugar (glucose) after you have not eaten for a while (fasting). You may have this done every 1-3 years. Bone density scan. This is done to screen for osteoporosis. You may have this done starting at age 41. Mammogram. This may be done every 1-2 years. Talk to your health care provider about how often you should have regular mammograms. Talk with your health care provider about your test results, treatment options, and if necessary, the need for more tests. Vaccines  Your health care provider may recommend certain vaccines, such as: Influenza vaccine. This is recommended every year. Tetanus, diphtheria, and acellular pertussis (Tdap, Td) vaccine. You may need a Td booster every 10 years. Zoster vaccine. You may need this after age 23. Pneumococcal 13-valent conjugate (PCV13) vaccine. One dose is recommended after age 43. Pneumococcal polysaccharide (PPSV23) vaccine. One  dose is  recommended after age 39. Talk to your health care provider about which screenings and vaccines you need and how often you need them. This information is not intended to replace advice given to you by your health care provider. Make sure you discuss any questions you have with your health care provider. Document Released: 10/16/2015 Document Revised: 06/08/2016 Document Reviewed: 07/21/2015 Elsevier Interactive Patient Education  2017 [employer] Prevention in the Home Falls can cause injuries. They can happen to people of all ages. There are many things you can do to make your home safe and to help prevent falls. What can I do on the outside of my home? Regularly fix the edges of walkways and driveways and fix any cracks. Remove anything that might make you trip as you walk through a door, such as a raised step or threshold. Trim any bushes or trees on the path to your home. Use bright outdoor lighting. Clear any walking paths of anything that might make someone trip, such as rocks or tools. Regularly check to see if handrails are loose or broken. Make sure that both sides of any steps have handrails. Any raised decks and porches should have guardrails on the edges. Have any leaves, snow, or ice cleared regularly. Use sand or salt on walking paths during winter. Clean up any spills in your garage right away. This includes oil or grease spills. What can I do in the bathroom? Use night lights. Install grab bars by the toilet and in the tub and shower. Do not use towel bars as grab bars. Use non-skid mats or decals in the tub or shower. If you need to sit down in the shower, use a plastic, non-slip stool. Keep the floor dry. Clean up any water that spills on the floor as soon as it happens. Remove soap buildup in the tub or shower regularly. Attach bath mats securely with double-sided non-slip rug tape. Do not have throw rugs and other things on the floor that can make you  trip. What can I do in the bedroom? Use night lights. Make sure that you have a light by your bed that is easy to reach. Do not use any sheets or blankets that are too big for your bed. They should not hang down onto the floor. Have a firm chair that has side arms. You can use this for support while you get dressed. Do not have throw rugs and other things on the floor that can make you trip. What can I do in the kitchen? Clean up any spills right away. Avoid walking on wet floors. Keep items that you use a lot in easy-to-reach places. If you need to reach something above you, use a strong step stool that has a grab bar. Keep electrical cords out of the way. Do not use floor polish or wax that makes floors slippery. If you must use wax, use non-skid floor wax. Do not have throw rugs and other things on the floor that can make you trip. What can I do with my stairs? Do not leave any items on the stairs. Make sure that there are handrails on both sides of the stairs and use them. Fix handrails that are broken or loose. Make sure that handrails are as long as the stairways. Check any carpeting to make sure that it is firmly attached to the stairs. Fix any carpet that is loose or worn. Avoid having throw rugs at the top or bottom of the  stairs. If you do have throw rugs, attach them to the floor with carpet tape. Make sure that you have a light switch at the top of the stairs and the bottom of the stairs. If you do not have them, ask someone to add them for you. What else can I do to help prevent falls? Wear shoes that: Do not have high heels. Have rubber bottoms. Are comfortable and fit you well. Are closed at the toe. Do not wear sandals. If you use a stepladder: Make sure that it is fully opened. Do not climb a closed stepladder. Make sure that both sides of the stepladder are locked into place. Ask someone to hold it for you, if possible. Clearly mark and make sure that you can  see: Any grab bars or handrails. First and last steps. Where the edge of each step is. Use tools that help you move around (mobility aids) if they are needed. These include: Canes. Walkers. Scooters. Crutches. Turn on the lights when you go into a dark area. Replace any light bulbs as soon as they burn out. Set up your furniture so you have a clear path. Avoid moving your furniture around. If any of your floors are uneven, fix them. If there are any pets around you, be aware of where they are. Review your medicines with your doctor. Some medicines can make you feel dizzy. This can increase your chance of falling. Ask your doctor what other things that you can do to help prevent falls. This information is not intended to replace advice given to you by your health care provider. Make sure you discuss any questions you have with your health care provider. Document Released: 07/16/2009 Document Revised: 02/25/2016 Document Reviewed: 10/24/2014 Elsevier Interactive Patient Education  2017 [employer].

## 2021-11-17 DIAGNOSIS — L821 Other seborrheic keratosis: Secondary | ICD-10-CM | POA: Diagnosis not present

## 2021-11-17 DIAGNOSIS — D692 Other nonthrombocytopenic purpura: Secondary | ICD-10-CM | POA: Diagnosis not present

## 2021-11-17 DIAGNOSIS — L570 Actinic keratosis: Secondary | ICD-10-CM | POA: Diagnosis not present

## 2021-11-17 DIAGNOSIS — D0471 Carcinoma in situ of skin of right lower limb, including hip: Secondary | ICD-10-CM | POA: Diagnosis not present

## 2021-11-17 DIAGNOSIS — D485 Neoplasm of uncertain behavior of skin: Secondary | ICD-10-CM | POA: Diagnosis not present

## 2021-11-17 DIAGNOSIS — Z85828 Personal history of other malignant neoplasm of skin: Secondary | ICD-10-CM | POA: Diagnosis not present

## 2021-11-17 DIAGNOSIS — D225 Melanocytic nevi of trunk: Secondary | ICD-10-CM | POA: Diagnosis not present

## 2021-11-30 DIAGNOSIS — H0100B Unspecified blepharitis left eye, upper and lower eyelids: Secondary | ICD-10-CM | POA: Diagnosis not present

## 2021-11-30 DIAGNOSIS — H00014 Hordeolum externum left upper eyelid: Secondary | ICD-10-CM | POA: Diagnosis not present

## 2021-11-30 DIAGNOSIS — H0100A Unspecified blepharitis right eye, upper and lower eyelids: Secondary | ICD-10-CM | POA: Diagnosis not present

## 2022-01-03 DIAGNOSIS — H04123 Dry eye syndrome of bilateral lacrimal glands: Secondary | ICD-10-CM | POA: Diagnosis not present

## 2022-01-03 DIAGNOSIS — Z961 Presence of intraocular lens: Secondary | ICD-10-CM | POA: Diagnosis not present

## 2022-01-03 DIAGNOSIS — H43813 Vitreous degeneration, bilateral: Secondary | ICD-10-CM | POA: Diagnosis not present

## 2022-01-03 DIAGNOSIS — H40013 Open angle with borderline findings, low risk, bilateral: Secondary | ICD-10-CM | POA: Diagnosis not present

## 2022-01-11 DIAGNOSIS — Z01419 Encounter for gynecological examination (general) (routine) without abnormal findings: Secondary | ICD-10-CM | POA: Diagnosis not present

## 2022-01-11 DIAGNOSIS — Z6827 Body mass index (BMI) 27.0-27.9, adult: Secondary | ICD-10-CM | POA: Diagnosis not present

## 2022-01-11 DIAGNOSIS — Z1231 Encounter for screening mammogram for malignant neoplasm of breast: Secondary | ICD-10-CM | POA: Diagnosis not present

## 2022-01-17 ENCOUNTER — Telehealth: Payer: Self-pay | Admitting: Gastroenterology

## 2022-01-17 ENCOUNTER — Other Ambulatory Visit: Payer: Self-pay

## 2022-01-17 MED ORDER — LANSOPRAZOLE 30 MG PO CPDR: 30.0000 mg | DELAYED_RELEASE_CAPSULE | Freq: Every day | ORAL | 3 refills | Status: DC

## 2022-01-17 MED ORDER — FAMOTIDINE 20 MG PO TABS: 20.0000 mg | ORAL_TABLET | Freq: Every day | ORAL | 3 refills | Status: DC

## 2022-01-17 NOTE — Telephone Encounter (Signed)
Patient called seeking advise. Per patient, she is having abd pain and believes it's due to the Protonix medication. She would like to know if she should stop taking this medication? Please advise.  ?

## 2022-01-17 NOTE — Telephone Encounter (Signed)
Discussed with the patient. She is in agreement with this plan of care. Medication listed updated. Patient will call in a couple of weeks with an update and schedule follow up at that time. She is presently out of town. ?Rx to [place], [place]. ?

## 2022-01-17 NOTE — Telephone Encounter (Signed)
Patient is returning your call.  

## 2022-01-17 NOTE — Telephone Encounter (Signed)
Called the patient. No answer. Left message on her voicemail of returned call. ?

## 2022-01-17 NOTE — Telephone Encounter (Signed)
Please advise patient to stop pantoprazole.  Please send prescription for lansoprazole 30 mg daily in the morning and famotidine 20 mg at bedtime.  Please schedule follow-up office visit next available appointment with me or app.  Thank you ?

## 2022-01-17 NOTE — Telephone Encounter (Signed)
Spoke with the patient. She is reporting cramping of her toes and hands at night. This has been occurring since she has been on Protonix. She has a new onset on thigh pain now as well. The thigh pain is noticeable to her in the mornings. She attributes these symptoms to the Protonix. She tried not taking Protonix for 1 week. The cramping decreased but did not subside. Her did GERD worsened during the week off of it. She is not sure what to do at this point. She is afraid to stop Protonix because of her GERD and because she read she should not abruptly stop this medication. What do you advise? ?

## 2022-02-05 IMAGING — US US ABDOMEN COMPLETE
1 series · 14 of 25 positions shown · non-contrast
Comparison: CT September 01, 2020.

CLINICAL DATA: Nausea and upper abdominal pain.

EXAM:
ABDOMEN ULTRASOUND COMPLETE

[Series 1: us abdomen complete · 14 of 78 slices shown]
[im 1/78]
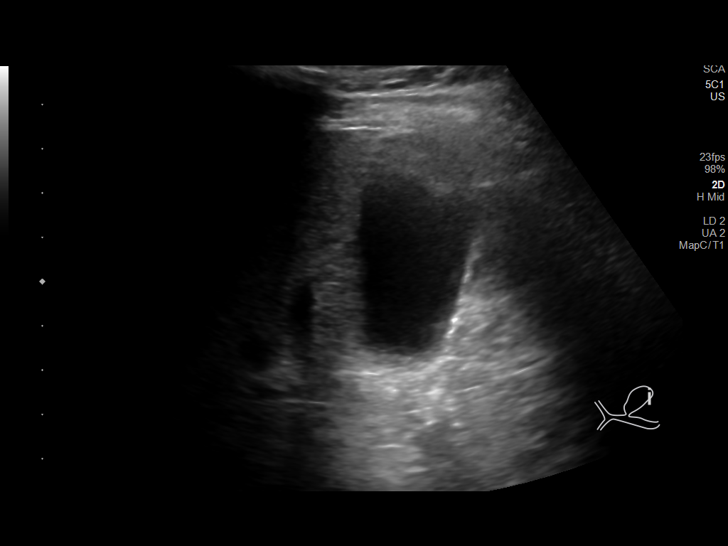
[im 7/78]
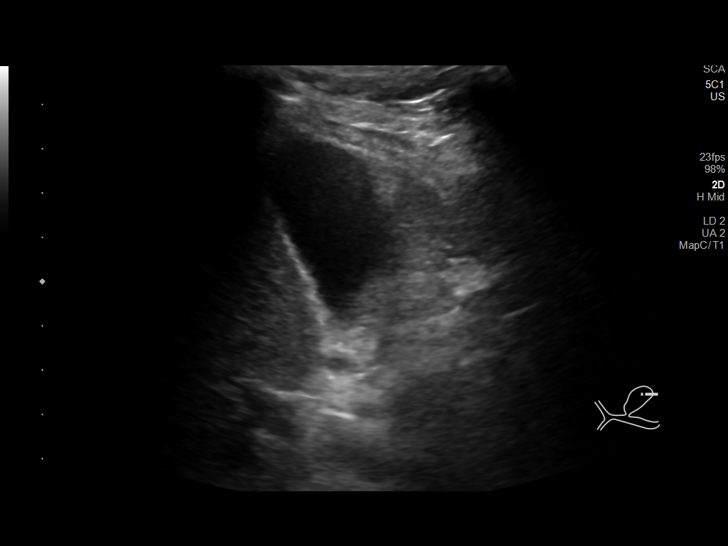
[im 13/78]
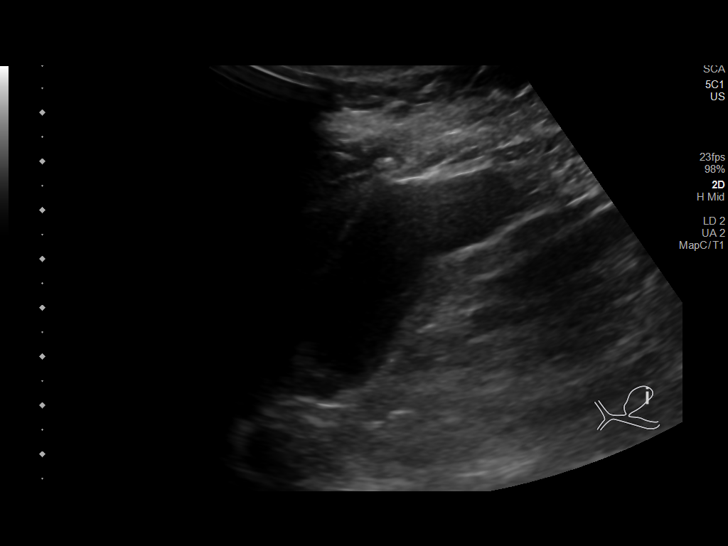
[im 20/78]
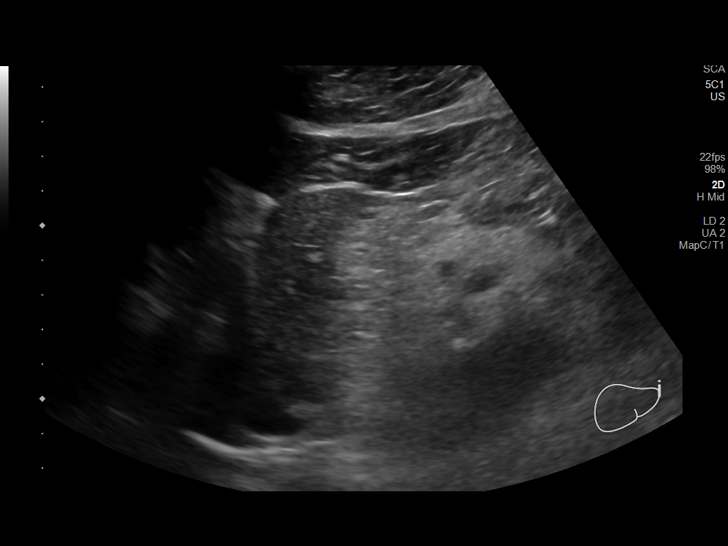
[im 26/78]
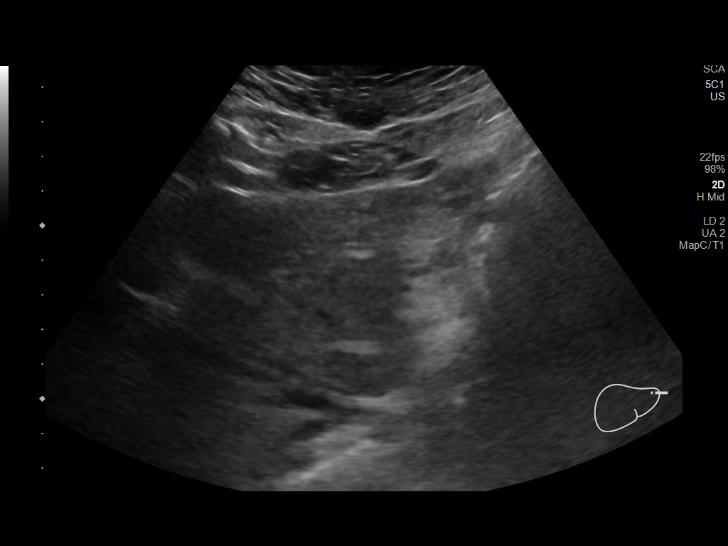
[im 29/78]
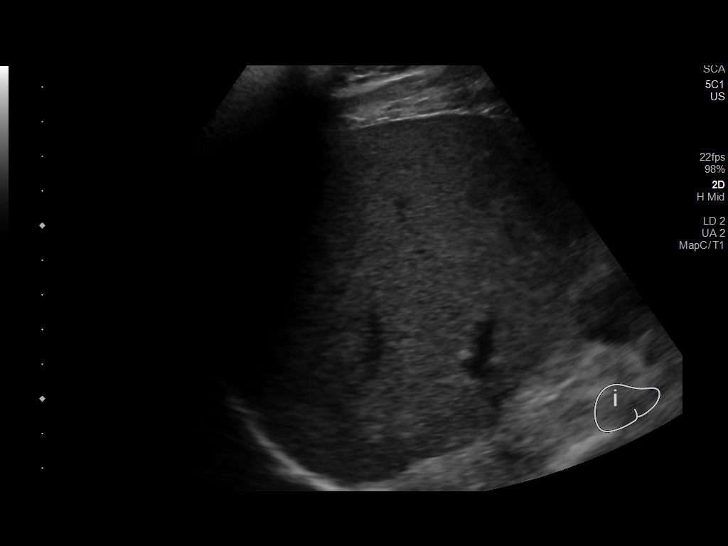
[im 36/78]
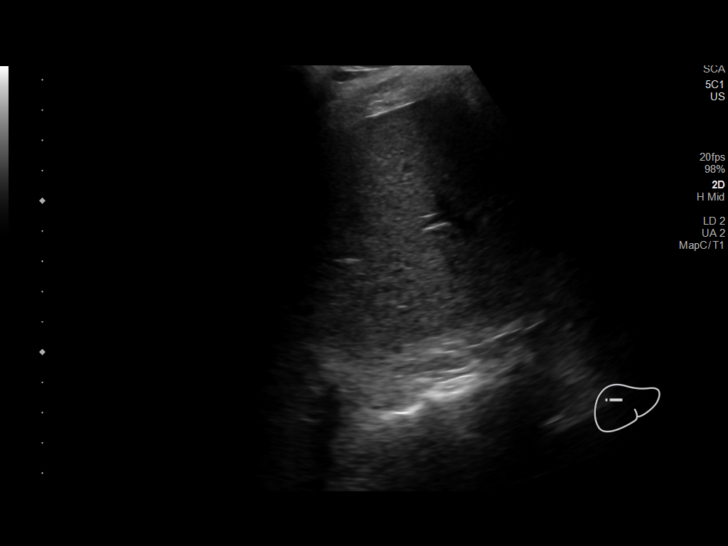
[im 42/78]
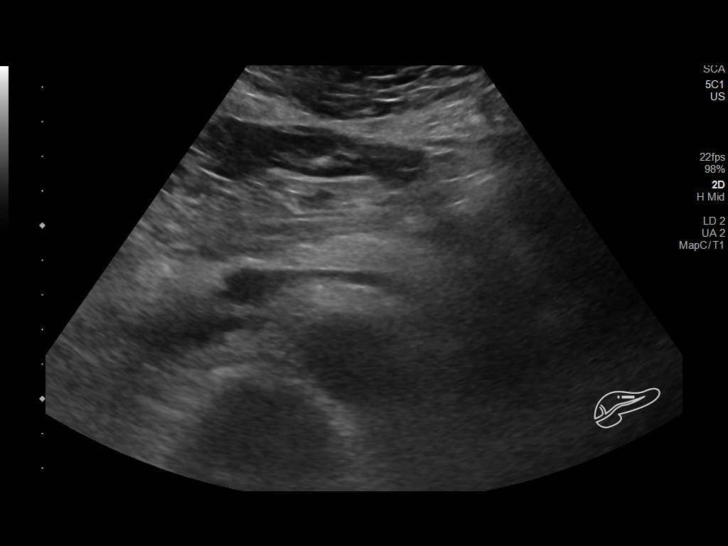
[im 49/78]
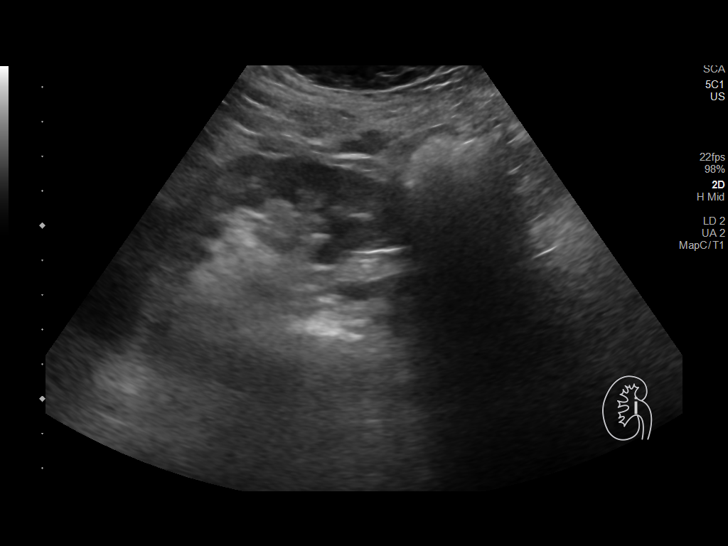
[im 52/78]
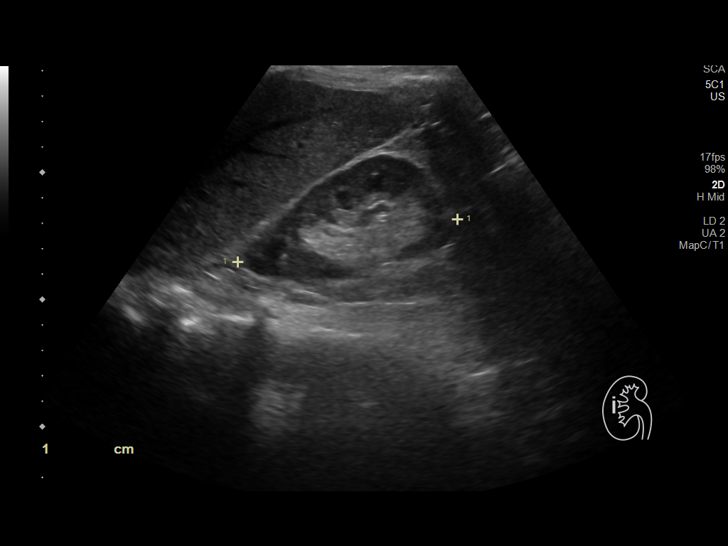
[im 58/78]
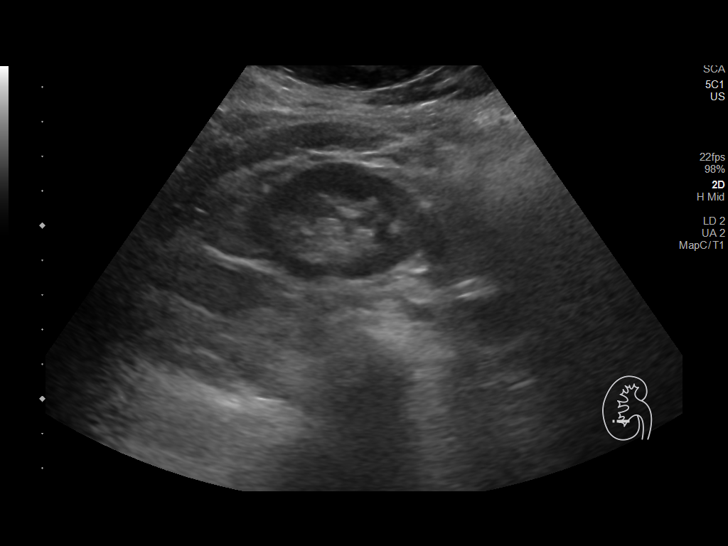
[im 65/78]
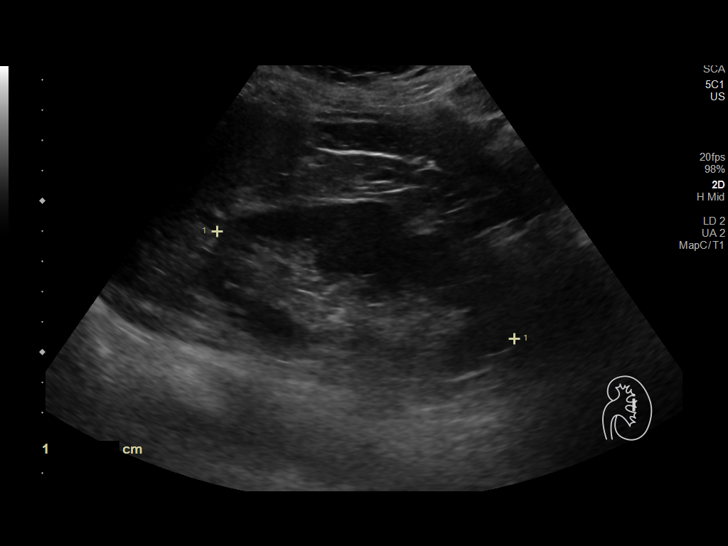
[im 71/78]
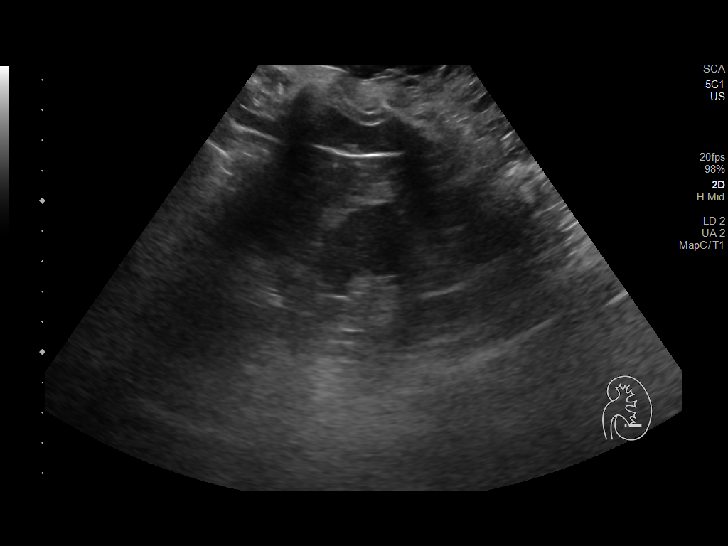
[im 78/78]
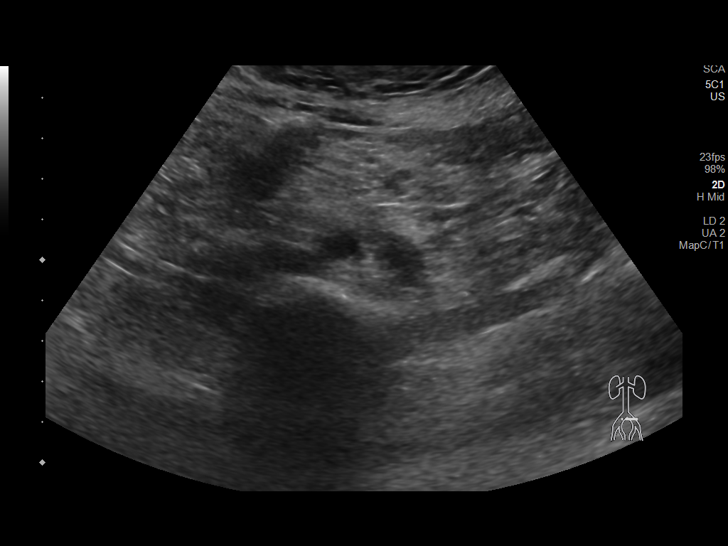

[14 of 25 positions shown; findings below may reference images not displayed]

FINDINGS: Gallbladder: No gallstones or wall thickening visualized. No
sonographic Murphy sign noted by sonographer.

Common bile duct: Diameter: 4 mm

Liver: No focal lesion identified. Diffusely increased parenchymal
echogenicity. Portal vein is patent on color Doppler imaging with
normal direction of blood flow towards the liver.

IVC: No abnormality visualized.

Pancreas: Visualized portion unremarkable.

Spleen: Size and appearance within normal limits.

Right Kidney: Length: 8.8 cm. Echogenicity within normal limits. No
mass or hydronephrosis visualized.

Left Kidney: Length: 10.4 cm. Echogenicity within normal limits. No
mass or hydronephrosis visualized.

Abdominal aorta: No aneurysm visualized.

Other findings: None.
IMPRESSION: The echogenicity of the liver is increased. This is a nonspecific
finding but is most commonly seen with fatty infiltration of the
liver. There are no obvious focal liver lesions.

## 2022-03-28 IMAGING — NM NM HEPATO W/GB/PHARM/[PERSON_NAME]
2 series · 12 of 12 positions shown · non-contrast
Comparison: None.

CLINICAL DATA: Nausea and vomiting right upper quadrant pain

EXAM:
NUCLEAR MEDICINE HEPATOBILIARY IMAGING WITH GALLBLADDER EF
TECHNIQUE: Sequential images of the abdomen were obtained [DATE] minutes
following intravenous administration of radiopharmaceutical. After
oral ingestion of Ensure, gallbladder ejection fraction was
determined. At 60 min, normal ejection fraction is greater than 33%.
RADIOPHARMACEUTICALS:  5.3 mCi Mc-IIm  Choletec IV

[Series 1: biliary · 3.25mm/px · 6 of 60 frames shown]
[frame 6/60]
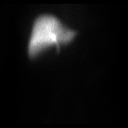
[frame 16/60]
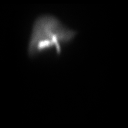
[frame 26/60]
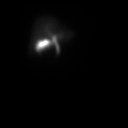
[frame 36/60]
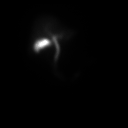
[frame 46/60]
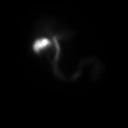
[frame 56/60]
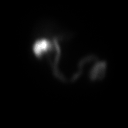

[Series 2: gbef · 3.25mm/px · 6 of 60 frames shown]
[frame 6/60]
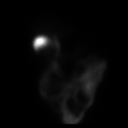
[frame 16/60]
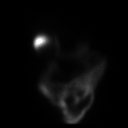
[frame 26/60]
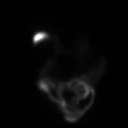
[frame 36/60]
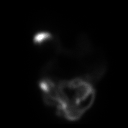
[frame 46/60]
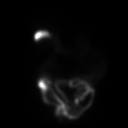
[frame 56/60]
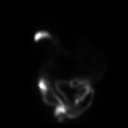

[12 of 12 positions shown; findings below may reference images not displayed]

FINDINGS: Prompt uptake and biliary excretion of activity by the liver is
seen. Gallbladder activity is visualized, consistent with patency of
cystic duct. Biliary activity passes into small bowel, consistent
with patent common bile duct.

Calculated gallbladder ejection fraction is 65%. (Normal gallbladder
ejection fraction with Ensure is greater than 33%.)
IMPRESSION: Normal uptake and excretion of biliary tracer.

Normal gallbladder ejection fraction of 65%.

## 2022-04-07 ENCOUNTER — Other Ambulatory Visit: Payer: Self-pay | Admitting: Family Medicine

## 2022-04-07 DIAGNOSIS — G4700 Insomnia, unspecified: Secondary | ICD-10-CM

## 2022-04-07 DIAGNOSIS — G43809 Other migraine, not intractable, without status migrainosus: Secondary | ICD-10-CM

## 2022-04-08 ENCOUNTER — Encounter: Payer: Self-pay | Admitting: Family Medicine

## 2022-04-08 ENCOUNTER — Ambulatory Visit (INDEPENDENT_AMBULATORY_CARE_PROVIDER_SITE_OTHER): Payer: Medicare HMO | Admitting: Family Medicine

## 2022-04-08 VITALS — BP 110/90 | HR 68 | Temp 98.4°F | Resp 18 | Ht 64.0 in | Wt 154.4 lb

## 2022-04-08 DIAGNOSIS — E785 Hyperlipidemia, unspecified: Secondary | ICD-10-CM

## 2022-04-08 DIAGNOSIS — G4700 Insomnia, unspecified: Secondary | ICD-10-CM

## 2022-04-08 DIAGNOSIS — I10 Essential (primary) hypertension: Secondary | ICD-10-CM | POA: Diagnosis not present

## 2022-04-08 MED ORDER — TEMAZEPAM 30 MG PO CAPS: ORAL_CAPSULE | ORAL | 1 refills | Status: DC

## 2022-04-08 MED ORDER — BELSOMRA 10 MG PO TABS: 10.0000 mg | ORAL_TABLET | Freq: Every evening | ORAL | 2 refills | Status: DC | PRN

## 2022-04-08 NOTE — Patient Instructions (Signed)
Insomnia Insomnia is a sleep disorder that makes it difficult to fall asleep or stay asleep. Insomnia can cause fatigue, low energy, difficulty concentrating, mood swings, and poor performance at work or school. There are three different ways to classify insomnia: Difficulty falling asleep. Difficulty staying asleep. Waking up too early in the morning. Any type of insomnia can be long-term (chronic) or short-term (acute). Both are common. Short-term insomnia usually lasts for 3 months or less. Chronic insomnia occurs at least three times a week for longer than 3 months. What are the causes? Insomnia may be caused by another condition, situation, or substance, such as: Having certain mental health conditions, such as anxiety and depression. Using caffeine, alcohol, tobacco, or drugs. Having gastrointestinal conditions, such as gastroesophageal reflux disease (GERD). Having certain medical conditions. These include: Asthma. Alzheimer's disease. Stroke. Chronic pain. An overactive thyroid gland (hyperthyroidism). Other sleep disorders, such as restless legs syndrome and sleep apnea. Menopause. Sometimes, the cause of insomnia may not be known. What increases the risk? Risk factors for insomnia include: Gender. Females are affected more often than males. Age. Insomnia is more common as people get older. Stress and certain medical and mental health conditions. Lack of exercise. Having an irregular work schedule. This may include working night shifts and traveling between different time zones. What are the signs or symptoms? If you have insomnia, the main symptom is having trouble falling asleep or having trouble staying asleep. This may lead to other symptoms, such as: Feeling tired or having low energy. Feeling nervous about going to sleep. Not feeling rested in the morning. Having trouble concentrating. Feeling irritable, anxious, or depressed. How is this diagnosed? This condition  may be diagnosed based on: Your symptoms and medical history. Your health care provider may ask about: Your sleep habits. Any medical conditions you have. Your mental health. A physical exam. How is this treated? Treatment for insomnia depends on the cause. Treatment may focus on treating an underlying condition that is causing the insomnia. Treatment may also include: Medicines to help you sleep. Counseling or therapy. Lifestyle adjustments to help you sleep better. Follow these instructions at home: Eating and drinking  Limit or avoid alcohol, caffeinated beverages, and products that contain nicotine and tobacco, especially close to bedtime. These can disrupt your sleep. Do not eat a large meal or eat spicy foods right before bedtime. This can lead to digestive discomfort that can make it hard for you to sleep. Sleep habits  Keep a sleep diary to help you and your health care provider figure out what could be causing your insomnia. Write down: When you sleep. When you wake up during the night. How well you sleep and how rested you feel the next day. Any side effects of medicines you are taking. What you eat and drink. Make your bedroom a dark, comfortable place where it is easy to fall asleep. Put up shades or blackout curtains to block light from outside. Use a white noise machine to block noise. Keep the temperature cool. Limit screen use before bedtime. This includes: Not watching TV. Not using your smartphone, tablet, or computer. Stick to a routine that includes going to bed and waking up at the same times every day and night. This can help you fall asleep faster. Consider making a quiet activity, such as reading, part of your nighttime routine. Try to avoid taking naps during the day so that you sleep better at night. Get out of bed if you are still awake after   15 minutes of trying to sleep. Keep the lights down, but try reading or doing a quiet activity. When you feel  sleepy, go back to bed. General instructions Take over-the-counter and prescription medicines only as told by your health care provider. Exercise regularly as told by your health care provider. However, avoid exercising in the hours right before bedtime. Use relaxation techniques to manage stress. Ask your health care provider to suggest some techniques that may work well for you. These may include: Breathing exercises. Routines to release muscle tension. Visualizing peaceful scenes. Make sure that you drive carefully. Do not drive if you feel very sleepy. Keep all follow-up visits. This is important. Contact a health care provider if: You are tired throughout the day. You have trouble in your daily routine due to sleepiness. You continue to have sleep problems, or your sleep problems get worse. Get help right away if: You have thoughts about hurting yourself or someone else. Get help right away if you feel like you may hurt yourself or others, or have thoughts about taking your own life. Go to your nearest emergency room or: Call 911. Call the National Suicide Prevention Lifeline at 1-800-273-8255 or 988. This is open 24 hours a day. Text the Crisis Text Line at 741741. Summary Insomnia is a sleep disorder that makes it difficult to fall asleep or stay asleep. Insomnia can be long-term (chronic) or short-term (acute). Treatment for insomnia depends on the cause. Treatment may focus on treating an underlying condition that is causing the insomnia. Keep a sleep diary to help you and your health care provider figure out what could be causing your insomnia. This information is not intended to replace advice given to you by your health care provider. Make sure you discuss any questions you have with your health care provider. Document Revised: 08/30/2021 Document Reviewed: 08/30/2021 Elsevier Patient Education  2023 [employer] Inc[employer]  

## 2022-04-08 NOTE — Assessment & Plan Note (Signed)
Well controlled, no changes to meds. Encouraged heart healthy diet such as the DASH diet and exercise as tolerated.  °

## 2022-04-08 NOTE — Progress Notes (Unsigned)
   Established Patient Office Visit  Subjective   Patient ID: Laura Hopkins, female    DOB: 1946/06/17  Age: 76 y.o. MRN: [id number]  Chief Complaint  Patient presents with   Hypertension   Follow-up    HPI Pt is her to f/u bp  {History (Optional):23778}  ROS    Objective:     BP 110/90 (BP Location: Left Arm, Patient Position: Sitting, Cuff Size: Normal)   Pulse 68   Temp 98.4 F (36.9 C) (Oral)   Resp 18   Ht '5\' 4"'$  (1.626 m)   Wt 154 lb 6.4 oz (70 kg)   SpO2 98%   BMI 26.50 kg/m  {Vitals History (Optional):23777}  Physical Exam   No results found for any visits on 04/08/22.  {Labs (Optional):23779}  The 10-year ASCVD risk score (Arnett DK, et al., 2019) is: 12.6%    Assessment & Plan:   Problem List Items Addressed This Visit       Unprioritized   Insomnia   Relevant Medications   temazepam (RESTORIL) 30 MG capsule   Suvorexant (BELSOMRA) 10 MG TABS   Hyperlipidemia   Relevant Orders   CBC with Differential/Platelet   Comprehensive metabolic panel   Lipid panel   Other Visit Diagnoses     Primary hypertension    -  Primary   Relevant Orders   CBC with Differential/Platelet   Comprehensive metabolic panel   Lipid panel       No follow-ups on file.    Ann Held, DO

## 2022-04-09 LAB — COMPREHENSIVE METABOLIC PANEL
AG Ratio: 2 (calc) (ref 1.0–2.5)
ALT: 7 U/L (ref 6–29)
AST: 21 U/L (ref 10–35)
Albumin: 4.5 g/dL (ref 3.6–5.1)
Alkaline phosphatase (APISO): 67 U/L (ref 37–153)
BUN/Creatinine Ratio: 15 (calc) (ref 6–22)
BUN: 15 mg/dL (ref 7–25)
CO2: 25 mmol/L (ref 20–32)
Calcium: 10.1 mg/dL (ref 8.6–10.4)
Chloride: 99 mmol/L (ref 98–110)
Creat: 1.02 mg/dL — ABNORMAL HIGH (ref 0.60–1.00)
Globulin: 2.3 g/dL (calc) (ref 1.9–3.7)
Glucose, Bld: 78 mg/dL (ref 65–99)
Potassium: 4.8 mmol/L (ref 3.5–5.3)
Sodium: 135 mmol/L (ref 135–146)
Total Bilirubin: 0.7 mg/dL (ref 0.2–1.2)
Total Protein: 6.8 g/dL (ref 6.1–8.1)

## 2022-04-09 LAB — LIPID PANEL
Cholesterol: 187 mg/dL (ref <200)
HDL: 63 mg/dL (ref >=50)
LDL Cholesterol (Calc): 102 mg/dL (calc) — ABNORMAL HIGH
Non-HDL Cholesterol (Calc): 124 mg/dL (calc) (ref <130)
Total CHOL/HDL Ratio: 3 (calc) (ref <5.0)
Triglycerides: 120 mg/dL (ref <150)

## 2022-04-09 LAB — CBC WITH DIFFERENTIAL/PLATELET
Absolute Monocytes: 576 cells/uL (ref 200–950)
Basophils Absolute: 52 cells/uL (ref 0–200)
Basophils Relative: 0.6 %
Eosinophils Absolute: 232 cells/uL (ref 15–500)
Eosinophils Relative: 2.7 %
HCT: 41.7 % (ref 35.0–45.0)
Hemoglobin: 14 g/dL (ref 11.7–15.5)
Lymphs Abs: 2090 cells/uL (ref 850–3900)
MCH: 31.8 pg (ref 27.0–33.0)
MCHC: 33.6 g/dL (ref 32.0–36.0)
MCV: 94.8 fL (ref 80.0–100.0)
MPV: 11.3 fL (ref 7.5–12.5)
Monocytes Relative: 6.7 %
Neutro Abs: 5650 cells/uL (ref 1500–7800)
Neutrophils Relative %: 65.7 %
Platelets: 297 10*3/uL (ref 140–400)
RBC: 4.4 10*6/uL (ref 3.80–5.10)
RDW: 12.5 % (ref 11.0–15.0)
Total Lymphocyte: 24.3 %
WBC: 8.6 10*3/uL (ref 3.8–10.8)

## 2022-04-12 ENCOUNTER — Encounter: Payer: Self-pay | Admitting: Pulmonary Disease

## 2022-04-12 ENCOUNTER — Ambulatory Visit: Payer: Medicare HMO | Admitting: Pulmonary Disease

## 2022-04-12 VITALS — BP 112/68 | HR 72 | Ht 64.0 in | Wt 155.0 lb

## 2022-04-12 DIAGNOSIS — J4540 Moderate persistent asthma, uncomplicated: Secondary | ICD-10-CM

## 2022-04-12 NOTE — Progress Notes (Signed)
Laura Hopkins    [id number]    [birth year]  Primary Care Physician:Lowne Cheri Rous Alferd Apa, DO  Referring Physician: Carollee Herter, Alferd Apa, DO [address] [place],  [place] [address]  Chief complaint: Follow-up for asthma  HPI: 76 year old with history of OSA on CPAP, depression, hypertension, hysterectomy  C/O cough for the past 6 years. Originally thought to be secondary to ARB which was held Cough is non productive in nature with wheezing, congestion in morning. Dyspnea is increased with cold air. Started on singulair around summer 2022 with improvement but developed nausea so stopped.  Now she has return of cough  She has been prescribed Flovent which she uses only intermittently  Has allergic rhinitis and pos nasal drip. H/O deviated septum s/p surgery H/O OSA, stopped CPAP in 2021.   Evaluated by Dr. Silverio Decamp, GI for abdominal discomfort Continues on PPI and pepcid.  EGD on dec 2022 done showing hiatal hernia and normal stomach  Pets: Two dogs Occupation:Retired from postal service Exposures:No mold, hot tub jaccuzi, no down pillows or comforter Smoking history: Never smoker.  Travel history: No travel Relevant family history: No family history of lung disease  Interim history: Continues on Symbicort with good control of symptoms.  No new complaints today   Outpatient Encounter Medications as of 04/12/2022  Medication Sig   acetaminophen (TYLENOL) 500 MG tablet Take 500 mg by mouth every 6 (six) hours as needed.   albuterol (VENTOLIN HFA) 108 (90 Base) MCG/ACT inhaler Inhale 2 puffs into the lungs every 6 (six) hours as needed for wheezing or shortness of breath.   budesonide-formoterol (SYMBICORT) 160-4.5 MCG/ACT inhaler Inhale 2 puffs into the lungs in the morning and at bedtime.   [employer] FIBER GUMMIES PO Take 2 tablets by mouth daily. + B Vitamins   estradiol (VIVELLE-DOT) 0.05 MG/24HR patch Place onto the skin 2 (two) times a week.    eszopiclone (LUNESTA) 2 MG TABS tablet Take 2 mg by mouth daily.   famotidine (PEPCID) 20 MG tablet Take 1 tablet (20 mg total) by mouth at bedtime.   fluticasone (FLOVENT HFA) 44 MCG/ACT inhaler Inhale 2 puffs into the lungs 2 (two) times daily.   lansoprazole (PREVACID) 30 MG capsule Take 1 capsule (30 mg total) by mouth daily before breakfast.   promethazine (PHENERGAN) 25 MG tablet Take 25 mg by mouth as needed.   spironolactone (ALDACTONE) 25 MG tablet TAKE 1 TABLET BY MOUTH EVERY DAY   SUMAtriptan (IMITREX) 100 MG tablet TAKE 1 TABLET BY MOUTH DAILY AS NEEDED FOR MIGRAINE.   valACYclovir (VALTREX) 1000 MG tablet Take 2 tabs and repeat in 12 hours in event of an outbreak.   Suvorexant (BELSOMRA) 10 MG TABS Take 10 mg by mouth at bedtime as needed. (Patient not taking: Reported on 04/12/2022)   temazepam (RESTORIL) 30 MG capsule 1 po qhs prn (Patient not taking: Reported on 04/12/2022)   No facility-administered encounter medications on file as of 04/12/2022.   Physical Exam: Blood pressure 112/68, pulse 72, height '5\' 4"'$  (1.626 m), weight 155 lb (70.3 kg), SpO2 98 %. Gen:      No acute distress HEENT:  EOMI, sclera anicteric Neck:     No masses; no thyromegaly Lungs:    Clear to auscultation bilaterally; normal respiratory effort CV:         Regular rate and rhythm; no murmurs Abd:      + bowel sounds; soft, non-tender; no  palpable masses, no distension Ext:    No edema; adequate peripheral perfusion Skin:      Warm and dry; no rash Neuro: alert and oriented x 3 Psych: normal mood and affect   Data Reviewed: Imaging: Chest x-ray 11/07/2019-no acute cardiopulmonary abnormality I have reviewed the images personally.  PFTs: 09/15/2021 FVC 2.88 (22%), FEV1 2.27 [107%], F/F 79, TLC 5.13 [104%], DLCO 19.72 [95%] Normal test  ACT score 04/10/2022-24  Labs: CBC 04/30/2021-WBC 5.9, eos 5.3%, absolute eosinophil count 313 CBC 08/05/2021-WBC 7.3, eos 2.1%, absolute eosinophil count  153  Assessment:  Moderate persistent asthma CBC reviewed with peripheral eosinophils She did have a good response to Singulair but developed nausea Now on Symbicort with improvement in symptoms  PFTs show normal lung function  Follow-up in 6 months.  If she continues to do well then we can reduce the dose of Symbicort.  GERD Continue on PPI  Plan/Recommendations: Continue Symbicort  Marshell Garfinkel MD [facility] 04/12/2022, 4:03 PM  CC: Ann Held, *

## 2022-04-12 NOTE — Patient Instructions (Addendum)
I am glad you are doing well with your breathing Continue the Symbicort inhaler as prescribed Follow-up in 6 months

## 2022-05-09 ENCOUNTER — Other Ambulatory Visit: Payer: Self-pay | Admitting: Pulmonary Disease

## 2022-05-12 ENCOUNTER — Ambulatory Visit: Payer: Medicare HMO | Admitting: Nurse Practitioner

## 2022-05-12 ENCOUNTER — Other Ambulatory Visit (INDEPENDENT_AMBULATORY_CARE_PROVIDER_SITE_OTHER): Payer: Medicare HMO

## 2022-05-12 ENCOUNTER — Encounter: Payer: Self-pay | Admitting: Nurse Practitioner

## 2022-05-12 VITALS — BP 124/70 | HR 67 | Ht 64.0 in | Wt 154.5 lb

## 2022-05-12 DIAGNOSIS — M6281 Muscle weakness (generalized): Secondary | ICD-10-CM

## 2022-05-12 DIAGNOSIS — M791 Myalgia, unspecified site: Secondary | ICD-10-CM

## 2022-05-12 DIAGNOSIS — R110 Nausea: Secondary | ICD-10-CM

## 2022-05-12 LAB — TSH: TSH: 1.61 u[IU]/mL (ref 0.35–5.50)

## 2022-05-12 LAB — CK: Total CK: 100 U/L (ref 7–177)

## 2022-05-12 LAB — C-REACTIVE PROTEIN: CRP: <1 mg/dL (ref 0.5–20.0)

## 2022-05-12 MED ORDER — GAVISCON 95-358 MG/15ML PO SUSP: 30.0000 mL | Freq: Three times a day (TID) | ORAL | 1 refills | Status: DC | PRN

## 2022-05-12 MED ORDER — GAVISCON 95-358 MG/15ML PO SUSP: 15.0000 mL | Freq: Three times a day (TID) | ORAL | 1 refills | Status: DC | PRN

## 2022-05-12 MED ORDER — ONDANSETRON 4 MG PO TBDP: 4.0000 mg | ORAL_TABLET | Freq: Three times a day (TID) | ORAL | 0 refills | Status: DC | PRN

## 2022-05-12 NOTE — Patient Instructions (Addendum)
Your provider has requested that you go to the basement level for lab work before leaving today. Press "B" on the elevator. The lab is located at the first door on the left as you exit the elevator.  Decrease Lanzoprazole 30 mg to every other day for 2 weeks then every 3rd day for 1 week then stop.   We have sent the following medications to your pharmacy for you to pick up at your convenience: Gaviscon 1 tablespoon three times daily as needed for heartburn or nausea. Zofran 4 mg ODT dissolve one tablet on tongue every 6-8 hours as needed for nausea.  Follow up with PCP for muscle weakness.   _______________________________________________________  If you are age 74 or older, your body mass index should be between 23-30. Your Body mass index is 26.52 kg/m. If this is out of the aforementioned range listed, please consider follow up with your Primary Care Provider.  If you are age 17 or younger, your body mass index should be between 19-25. Your Body mass index is 26.52 kg/m. If this is out of the aformentioned range listed, please consider follow up with your Primary Care Provider.   ________________________________________________________  The [facility] providers would like to encourage you to use [facility] to communicate with providers for non-urgent requests or questions.  Due to long hold times on the telephone, sending your provider a message by [facility] may be a faster and more efficient way to get a response.  Please allow 48 business hours for a response.  Please remember that this is for non-urgent requests.  _______________________________________________________

## 2022-05-12 NOTE — Progress Notes (Signed)
05/12/2022 Laura Hopkins [id number] 1946/01/03   Chief Complaint: Discuss acid reflux medications   History of Present Illness: 76 year old very pleasant female with history of hypertension, OSA, asthma, bronchitis and chronic GERD. She is followed by Dr. Silverio Decamp. She presents today with complaints of cramping to her hands and toes with thigh muscle pain while taking Protonix. She contacted Dr. Silverio Decamp with these symptoms 01/17/2022 and Pantoprazole was discontinued and she was started on Lansoprazole '30mg'$  QD and Famotidine '20mg'$  QHS. She subsequently stopped taking Famotidine on month later after she awakened with cramping in her lower legs, in the arches of her feet and toes. She remains on Lansoprazole '30mg'$  once daily at this time. She complains of weakness to her thigh muscles which occurs in the morning when she sits at the side of her bed and tries to stand up. Once she is up, she is able to walk without any further noticeable thigh weakness until the next morning. She doesn't have any difficulty walking up a few stairs. She has chronic arthritis with pain to her neck, shoulder and hands. In regard to her GERD history. She denies having any dysphagia or heartburn but describes her GERD symptom as nausea. Her nausea was well controlled on Lansoprazole. She recently stopped Lansoprazole for one week and her nausea returned so she went back on it.  Her most recent EGD was 09/08/2021 which showed a small hiatal hernia otherwise was normal.  Negative HIDA scan 09/2021.  An abdominal sonogram 07/2021 showed evidence of hepatic steatosis, the gallbladder was normal.  She is passing normal bowel movements.  No rectal bleeding or black stools.  Her most recent colonoscopy was 04/12/2016 which showed diverticulosis in the sigmoid and descending colon.  Labs 04/08/2022 showed normal electrolytes and LFTs.  TSH level was normal in 2012.     Latest Ref Rng & Units 04/08/2022    3:29 PM 08/05/2021    3:14 PM  07/28/2021    2:36 PM  CBC  WBC 3.8 - 10.8 Thousand/uL 8.6  7.3  7.8   Hemoglobin 11.7 - 15.5 g/dL 14.0  13.5  14.0   Hematocrit 35.0 - 45.0 % 41.7  41.6  42.6   Platelets 140 - 400 Thousand/uL 297  262.0  262.0        Latest Ref Rng & Units 04/08/2022    3:29 PM 07/28/2021    2:36 PM 05/12/2021    9:12 AM  CMP  Glucose 65 - 99 mg/dL 78  88  76   BUN 7 - 25 mg/dL '15  8  13   '$ Creatinine 0.60 - 1.00 mg/dL 1.02  0.93  1.11   Sodium 135 - 146 mmol/L 135  138  137   Potassium 3.5 - 5.3 mmol/L 4.8  4.7  4.5   Chloride 98 - 110 mmol/L 99  101  101   CO2 20 - 32 mmol/L '25  31  25   '$ Calcium 8.6 - 10.4 mg/dL 10.1  10.0  10.0   Total Protein 6.1 - 8.1 g/dL 6.8  6.9    Total Bilirubin 0.2 - 1.2 mg/dL 0.7  0.7    Alkaline Phos 39 - 117 U/L  73    AST 10 - 35 U/L 21  17    ALT 6 - 29 U/L 7  6       HIDA scan September 22, 2021: Normal exam   Abdominal ultrasound 08/02/21 The echogenicity of the liver is increased. This is  a nonspecific finding but is most commonly seen with fatty infiltration of the liver. There are no obvious focal liver lesions.   Colonoscopy  04/12/2016 - Diverticulosis in the sigmoid colon and in the descending colon. - Non-bleeding internal hemorrhoids.   EGD September 08, 2021: Small hiatal hernia otherwise normal - Z-line regular, 38 cm from the incisors. - Small hiatal hernia. - Normal stomach. - Normal examined duodenum. - No specimens collected.  Current Medications, Allergies, Past Medical History, Past Surgical History, Family History and Social History were reviewed in [employer] record.   Review of Systems:   Constitutional: Negative for fever, sweats, chills or weight loss.  Respiratory: Negative for shortness of breath.   Cardiovascular: Negative for chest pain, palpitations and leg swelling.  Gastrointestinal: See HPI.  Musculoskeletal: Negative for back pain or muscle aches.  Neurological: Negative for dizziness, headaches  or paresthesias.    Physical Exam: BP 124/70   Pulse 67   Ht '5\' 4"'$  (1.626 m)   Wt 154 lb 8 oz (70.1 kg)   SpO2 97%   BMI 26.52 kg/m   General: 76 year old female no acute distress Head: Normocephalic and atraumatic. Eyes: No scleral icterus. Conjunctiva pink . Ears: Normal auditory acuity. Mouth: Dentition intact. No ulcers or lesions.  Lungs: Clear throughout to auscultation. Heart: Regular rate and rhythm, no murmur. Abdomen: Soft, nontender and nondistended. No masses or hepatomegaly. Normal bowel sounds x 4 quadrants.  Rectal: Deferred. Musculoskeletal: Symmetrical with no gross deformities. Extremities: No edema. Neurological: Alert oriented x 4. No focal deficits.  Psychological: Alert and cooperative. Normal mood and affect  Assessment and Recommendations:  36) 76 year old female with a history of GERD/nausea. EGD 07/2021 without evidence of active reflux.  Abdominal sono without evidence of gallstones.  Negative [facility] HIDA scan.  Intolerant to PPIs and H2 blocker with associated cramping in her lower legs and feet with the development of bilateral thigh weakness which occurs when she sits at the side of the bed in the morning to stand up then asymptomatic throughout the rest of the day.  She has chronic neck and shoulder pain thought to be due to arthritis.  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 -Creatinine kinase level, CRP, TSH -Patient to follow-up with Dr. Carollee Herter for further myopathy evaluation, consider rheumatology referral -Add magnesium level to the above lab order -Wean off Lansoprazole.  Lansoprazole 30 mg one capsule po QOD x 2 weeks then one capsule po Q 3rd day x 7 days then off -Gaviscon 1 tablespoon 3 times daily as needed -Consider 24-hour pH monitoring off acid suppressants, to discuss further with Dr. Silverio Decamp -Zofran 4 mg ODT dissolve 1 tab on tongue every 6-8 hours as needed -Further recommendations to be determined after the above lab  results reviewed  2) Colon cancer screening colonoscopy 04/2016 showed diverticulosis without evidence of colon polyps. -Next colonoscopy due 04/2026

## 2022-05-13 ENCOUNTER — Other Ambulatory Visit (INDEPENDENT_AMBULATORY_CARE_PROVIDER_SITE_OTHER): Payer: Medicare HMO

## 2022-05-13 ENCOUNTER — Telehealth: Payer: Self-pay

## 2022-05-13 DIAGNOSIS — M6281 Muscle weakness (generalized): Secondary | ICD-10-CM

## 2022-05-13 DIAGNOSIS — R110 Nausea: Secondary | ICD-10-CM | POA: Diagnosis not present

## 2022-05-13 LAB — MAGNESIUM: Magnesium: 2.4 mg/dL (ref 1.5–2.5)

## 2022-05-13 NOTE — Telephone Encounter (Signed)
Add On Lab request taken to lab and verified that they could add on. Phlebotomist stated that they could add on:

## 2022-05-13 NOTE — Telephone Encounter (Signed)
-----   Message from Noralyn Pick, NP sent at 05/13/2022  8:14 AM EDT ----- Remo Lipps, can you see if the lab can add a magnesium level to the blood that was drawn yesterday.  She is on PPI with arthralgias which can be due to low magnesium level.  If the lab cannot add it let me know and I will have the PCP check it with the next lab draw.  Thank you

## 2022-05-14 ENCOUNTER — Other Ambulatory Visit: Payer: Self-pay | Admitting: Gastroenterology

## 2022-05-23 DIAGNOSIS — L570 Actinic keratosis: Secondary | ICD-10-CM | POA: Diagnosis not present

## 2022-05-23 DIAGNOSIS — Z85828 Personal history of other malignant neoplasm of skin: Secondary | ICD-10-CM | POA: Diagnosis not present

## 2022-05-23 DIAGNOSIS — L814 Other melanin hyperpigmentation: Secondary | ICD-10-CM | POA: Diagnosis not present

## 2022-07-07 ENCOUNTER — Other Ambulatory Visit: Payer: Self-pay | Admitting: Family Medicine

## 2022-07-07 DIAGNOSIS — M9903 Segmental and somatic dysfunction of lumbar region: Secondary | ICD-10-CM | POA: Diagnosis not present

## 2022-07-07 DIAGNOSIS — I10 Essential (primary) hypertension: Secondary | ICD-10-CM

## 2022-07-07 DIAGNOSIS — M9901 Segmental and somatic dysfunction of cervical region: Secondary | ICD-10-CM | POA: Diagnosis not present

## 2022-07-07 DIAGNOSIS — M50323 Other cervical disc degeneration at C6-C7 level: Secondary | ICD-10-CM | POA: Diagnosis not present

## 2022-07-07 DIAGNOSIS — M542 Cervicalgia: Secondary | ICD-10-CM | POA: Diagnosis not present

## 2022-07-08 DIAGNOSIS — M9903 Segmental and somatic dysfunction of lumbar region: Secondary | ICD-10-CM | POA: Diagnosis not present

## 2022-07-08 DIAGNOSIS — M50323 Other cervical disc degeneration at C6-C7 level: Secondary | ICD-10-CM | POA: Diagnosis not present

## 2022-07-08 DIAGNOSIS — M542 Cervicalgia: Secondary | ICD-10-CM | POA: Diagnosis not present

## 2022-07-08 DIAGNOSIS — M9901 Segmental and somatic dysfunction of cervical region: Secondary | ICD-10-CM | POA: Diagnosis not present

## 2022-07-11 DIAGNOSIS — M9901 Segmental and somatic dysfunction of cervical region: Secondary | ICD-10-CM | POA: Diagnosis not present

## 2022-07-11 DIAGNOSIS — M50323 Other cervical disc degeneration at C6-C7 level: Secondary | ICD-10-CM | POA: Diagnosis not present

## 2022-07-11 DIAGNOSIS — M9903 Segmental and somatic dysfunction of lumbar region: Secondary | ICD-10-CM | POA: Diagnosis not present

## 2022-07-11 DIAGNOSIS — M542 Cervicalgia: Secondary | ICD-10-CM | POA: Diagnosis not present

## 2022-07-14 DIAGNOSIS — M9903 Segmental and somatic dysfunction of lumbar region: Secondary | ICD-10-CM | POA: Diagnosis not present

## 2022-07-14 DIAGNOSIS — M9901 Segmental and somatic dysfunction of cervical region: Secondary | ICD-10-CM | POA: Diagnosis not present

## 2022-07-14 DIAGNOSIS — M542 Cervicalgia: Secondary | ICD-10-CM | POA: Diagnosis not present

## 2022-07-14 DIAGNOSIS — M50323 Other cervical disc degeneration at C6-C7 level: Secondary | ICD-10-CM | POA: Diagnosis not present

## 2022-07-18 DIAGNOSIS — M9901 Segmental and somatic dysfunction of cervical region: Secondary | ICD-10-CM | POA: Diagnosis not present

## 2022-07-18 DIAGNOSIS — M542 Cervicalgia: Secondary | ICD-10-CM | POA: Diagnosis not present

## 2022-07-18 DIAGNOSIS — M50323 Other cervical disc degeneration at C6-C7 level: Secondary | ICD-10-CM | POA: Diagnosis not present

## 2022-07-18 DIAGNOSIS — M9903 Segmental and somatic dysfunction of lumbar region: Secondary | ICD-10-CM | POA: Diagnosis not present

## 2022-07-25 DIAGNOSIS — M542 Cervicalgia: Secondary | ICD-10-CM | POA: Diagnosis not present

## 2022-07-25 DIAGNOSIS — M9903 Segmental and somatic dysfunction of lumbar region: Secondary | ICD-10-CM | POA: Diagnosis not present

## 2022-07-25 DIAGNOSIS — M50323 Other cervical disc degeneration at C6-C7 level: Secondary | ICD-10-CM | POA: Diagnosis not present

## 2022-07-25 DIAGNOSIS — M9901 Segmental and somatic dysfunction of cervical region: Secondary | ICD-10-CM | POA: Diagnosis not present

## 2022-08-22 ENCOUNTER — Other Ambulatory Visit: Payer: Self-pay | Admitting: Family Medicine

## 2022-08-22 DIAGNOSIS — G43809 Other migraine, not intractable, without status migrainosus: Secondary | ICD-10-CM

## 2022-08-23 DIAGNOSIS — M47812 Spondylosis without myelopathy or radiculopathy, cervical region: Secondary | ICD-10-CM | POA: Diagnosis not present

## 2022-09-06 ENCOUNTER — Encounter: Payer: Self-pay | Admitting: Pulmonary Disease

## 2022-09-06 ENCOUNTER — Ambulatory Visit: Payer: Medicare HMO | Admitting: Pulmonary Disease

## 2022-09-06 VITALS — BP 126/78 | HR 78 | Temp 98.0°F | Ht 64.0 in | Wt 153.4 lb

## 2022-09-06 DIAGNOSIS — J4540 Moderate persistent asthma, uncomplicated: Secondary | ICD-10-CM | POA: Diagnosis not present

## 2022-09-06 DIAGNOSIS — R053 Chronic cough: Secondary | ICD-10-CM

## 2022-09-06 DIAGNOSIS — M47812 Spondylosis without myelopathy or radiculopathy, cervical region: Secondary | ICD-10-CM | POA: Diagnosis not present

## 2022-09-06 NOTE — Telephone Encounter (Signed)
Dr. Vaughan Browner, please advise on pt's message. Pts formulary alternatives to Symbicort are: Breo, Symbicort and generic Advair. Thanks.

## 2022-09-06 NOTE — Patient Instructions (Signed)
I am glad you are stable with the Symbicort Will check with the pharmacy team to see what the formulary coverage is starting January 2024 Return to clinic in 6 months.

## 2022-09-06 NOTE — Progress Notes (Signed)
Laura Hopkins    [id number]    [birth year]  Primary Care Physician:Lowne Cheri Rous Alferd Apa, DO  Referring Physician: Carollee Herter, Alferd Apa, DO [address] [place],  [place] [address]  Chief complaint: Follow-up for asthma  HPI: 76 y.o. with history of OSA on CPAP, depression, hypertension, hysterectomy  C/O cough for the past 6 years. Originally thought to be secondary to ARB which was held Cough is non productive in nature with wheezing, congestion in morning. Dyspnea is increased with cold air. Started on singulair around summer 2022 with improvement but developed nausea so stopped.  Now she has return of cough  She has been prescribed Flovent which she uses only intermittently  Has allergic rhinitis and pos nasal drip. H/O deviated septum s/p surgery H/O OSA, stopped CPAP in 2021.   Evaluated by Dr. Silverio Decamp, GI for abdominal discomfort Continues on PPI and pepcid.  EGD on dec 2022 done showing hiatal hernia and normal stomach  Pets: Two dogs Occupation:Retired from postal service Exposures:No mold, hot tub jaccuzi, no down pillows or comforter Smoking history: Never smoker.  Travel history: No travel Relevant family history: No family history of lung disease  Interim history: Continues on Symbicort with good control of symptoms.  No new complaints today She is using only 1 puff of 160/4.4 once a day  Outpatient Encounter Medications as of 09/06/2022  Medication Sig   acetaminophen (TYLENOL) 500 MG tablet Take 500 mg by mouth every 6 (six) hours as needed.   albuterol (VENTOLIN HFA) 108 (90 Base) MCG/ACT inhaler Inhale 2 puffs into the lungs every 6 (six) hours as needed for wheezing or shortness of breath.   [employer] FIBER GUMMIES PO Take 2 tablets by mouth daily. + B Vitamins   estradiol (VIVELLE-DOT) 0.05 MG/24HR patch Place onto the skin 2 (two) times a week.   eszopiclone (LUNESTA) 2 MG TABS tablet Take 2 mg by mouth daily.   fluticasone  (FLOVENT HFA) 44 MCG/ACT inhaler Inhale 2 puffs into the lungs 2 (two) times daily.   promethazine (PHENERGAN) 25 MG tablet Take 25 mg by mouth as needed.   spironolactone (ALDACTONE) 25 MG tablet TAKE 1 TABLET BY MOUTH EVERY DAY   SUMAtriptan (IMITREX) 100 MG tablet Take 1 tablet (100 mg total) by mouth as needed for migraine. May repeat in 2 hours if needed   SYMBICORT 160-4.5 MCG/ACT inhaler INHALE 2 PUFFS INTO THE LUNGS IN THE MORNING AND AT BEDTIME.   temazepam (RESTORIL) 30 MG capsule 1 po qhs prn   valACYclovir (VALTREX) 1000 MG tablet Take 2 tabs and repeat in 12 hours in event of an outbreak. (Patient not taking: Reported on 09/06/2022)   [DISCONTINUED] aluminum hydroxide-magnesium carbonate (GAVISCON) 95-358 MG/15ML SUSP Take 15 mLs by mouth 3 (three) times daily as needed.   [DISCONTINUED] lansoprazole (PREVACID) 30 MG capsule TAKE 1 CAPSULE BY MOUTH DAILY BEFORE BREAKFAST.   [DISCONTINUED] ondansetron (ZOFRAN-ODT) 4 MG disintegrating tablet Take 1 tablet (4 mg total) by mouth every 8 (eight) hours as needed for nausea or vomiting.   [DISCONTINUED] Suvorexant (BELSOMRA) 10 MG TABS Take 10 mg by mouth at bedtime as needed. (Patient not taking: Reported on 04/12/2022)   No facility-administered encounter medications on file as of 09/06/2022.   Physical Exam: Blood pressure 126/78, pulse 78, temperature 98 F (36.7 C), temperature source Oral, height '5\' 4"'$  (1.626 m), weight 153 lb 6.4 oz (69.6 kg), SpO2 98 %. Gen:  No acute distress HEENT:  EOMI, sclera anicteric Neck:     No masses; no thyromegaly Lungs:    Clear to auscultation bilaterally; normal respiratory effort CV:         Regular rate and rhythm; no murmurs Abd:      + bowel sounds; soft, non-tender; no palpable masses, no distension Ext:    No edema; adequate peripheral perfusion Skin:      Warm and dry; no rash Neuro: alert and oriented x 3 Psych: normal mood and affect   Data Reviewed: Imaging: Chest x-ray  11/07/2019-no acute cardiopulmonary abnormality I have reviewed the images personally.  PFTs: 09/15/2021 FVC 2.88 (22%), FEV1 2.27 [107%], F/F 79, TLC 5.13 [104%], DLCO 19.72 [95%] Normal test  ACT score 04/10/2022-24  Labs: CBC 04/30/2021-WBC 5.9, eos 5.3%, absolute eosinophil count 313 CBC 08/05/2021-WBC 7.3, eos 2.1%, absolute eosinophil count 153  Assessment:  Moderate persistent asthma CBC reviewed with peripheral eosinophils She did have a good response to Singulair but developed nausea Now on Symbicort with improvement in symptoms  PFTs show normal lung function  We can probably further de-escalate the Symbicort but she is apparently having a formulary change and Symbicort is no longer covered starting January 2024.  Will see what the new alternatives before making a change  GERD Continue on PPI  Plan/Recommendations: Continue Symbicort  Marshell Garfinkel MD [facility] 09/06/2022, 3:31 PM  CC: Ann Held, *

## 2022-09-08 NOTE — Progress Notes (Signed)
We are not able to do a benefits investigation until the plan is active for 2024.

## 2022-09-09 MED ORDER — BUDESONIDE-FORMOTEROL FUMARATE 80-4.5 MCG/ACT IN AERO: 1.0000 | INHALATION_SPRAY | Freq: Two times a day (BID) | RESPIRATORY_TRACT | 12 refills | Status: DC

## 2022-09-09 NOTE — Telephone Encounter (Signed)
Please send in prescription for Symbicort generic 80/4.5.  Take 1 puff twice daily.

## 2022-09-20 DIAGNOSIS — M5412 Radiculopathy, cervical region: Secondary | ICD-10-CM | POA: Diagnosis not present

## 2022-10-04 DIAGNOSIS — M5412 Radiculopathy, cervical region: Secondary | ICD-10-CM | POA: Diagnosis not present

## 2022-10-04 DIAGNOSIS — M5030 Other cervical disc degeneration, unspecified cervical region: Secondary | ICD-10-CM | POA: Diagnosis not present

## 2022-10-13 DIAGNOSIS — M5412 Radiculopathy, cervical region: Secondary | ICD-10-CM | POA: Diagnosis not present

## 2022-10-24 DIAGNOSIS — M5412 Radiculopathy, cervical region: Secondary | ICD-10-CM | POA: Diagnosis not present

## 2022-10-26 DIAGNOSIS — M5412 Radiculopathy, cervical region: Secondary | ICD-10-CM | POA: Diagnosis not present

## 2022-10-31 DIAGNOSIS — M5412 Radiculopathy, cervical region: Secondary | ICD-10-CM | POA: Diagnosis not present

## 2022-11-04 ENCOUNTER — Telehealth: Payer: Self-pay | Admitting: Family Medicine

## 2022-11-04 NOTE — Telephone Encounter (Signed)
Copied from [place] [id number]. Topic: Medicare AWV >> Nov 04, 2022  1:46 PM Devoria Glassing wrote: Reason for [facility] message for patient to schedule Annual Wellness Visit(AWV).  Please schedule with Health Nurse Advisor at [facility]. Please call [phone] ask for [facility].

## 2022-11-10 DIAGNOSIS — M5412 Radiculopathy, cervical region: Secondary | ICD-10-CM | POA: Diagnosis not present

## 2022-12-05 ENCOUNTER — Telehealth: Payer: Self-pay | Admitting: Family Medicine

## 2022-12-05 NOTE — Telephone Encounter (Signed)
Contacted Essie Hart to schedule their annual wellness visit. Call back at later date: 01/02/2023. Spoke with patient spouse he stated she was at the beach and will be back 01/02/23  Sherol Dade; [place] Direct Dial: [phone]

## 2023-01-09 ENCOUNTER — Encounter: Payer: Self-pay | Admitting: Family Medicine

## 2023-01-09 ENCOUNTER — Ambulatory Visit (INDEPENDENT_AMBULATORY_CARE_PROVIDER_SITE_OTHER): Payer: Medicare HMO | Admitting: Family Medicine

## 2023-01-09 VITALS — BP 130/80 | HR 72 | Temp 98.1°F | Resp 18 | Ht 64.0 in | Wt 156.8 lb

## 2023-01-09 DIAGNOSIS — G43809 Other migraine, not intractable, without status migrainosus: Secondary | ICD-10-CM | POA: Diagnosis not present

## 2023-01-09 DIAGNOSIS — M5412 Radiculopathy, cervical region: Secondary | ICD-10-CM | POA: Diagnosis not present

## 2023-01-09 MED ORDER — PROMETHAZINE HCL 25 MG PO TABS
25.0000 mg | ORAL_TABLET | ORAL | 1 refills | Status: AC | PRN
Start: 2023-01-09 — End: ?

## 2023-01-09 MED ORDER — NURTEC 75 MG PO TBDP
ORAL_TABLET | ORAL | 2 refills | Status: DC
Start: 2023-01-09 — End: 2023-01-09

## 2023-01-09 MED ORDER — SUMATRIPTAN SUCCINATE 100 MG PO TABS
100.0000 mg | ORAL_TABLET | ORAL | 4 refills | Status: DC | PRN
Start: 2023-01-09 — End: 2023-06-26

## 2023-01-09 NOTE — Assessment & Plan Note (Signed)
?   May be contributing to migraines  Con't with ortho f/u

## 2023-01-09 NOTE — Assessment & Plan Note (Signed)
Worsening migraines  Mri brain Check labs  Health Maintenance  Topic Date Due   MAMMOGRAM  01/07/2022   Medicare Annual Wellness (AWV)  11/15/2022   INFLUENZA VACCINE  05/04/2023   COLONOSCOPY (Pts 45-54yrs Insurance coverage will need to be confirmed)  04/12/2026   DTaP/Tdap/Td (2 - Tdap) 07/02/2030   Pneumonia Vaccine 64+ Years old  Completed   DEXA SCAN  Completed   COVID-19 Vaccine  Completed   Hepatitis C Screening  Completed   Zoster Vaccines- Shingrix  Completed   HPV VACCINES  Aged Out

## 2023-01-09 NOTE — Progress Notes (Addendum)
Subjective:   By signing my name below, I, Laura Hopkins, attest that this documentation has been prepared under the direction and in the presence of [name], DO 01/09/23   Patient ID: Laura Hopkins, female    DOB: 04/18/1946, 77 y.o.   MRN: [id number]  Chief Complaint  Patient presents with   Migraine    Pt states having more migraines more frequently.    Follow-up    HPI Patient is in today for an office visit.   She reports that her migraines have become more severe and more frequent in the last 9 months. She describes that she always feels her migraines in the left side of her head all throughout (ear, nose, eye and side of face). She notes her migraines have lasted 3 days or more. She tends to take Sumatriptan, which does not always fully resolve symptoms. She has also tried taking Tylenol extra strength. She is UTD on routine vision exam.   She has been following up with Dr. Ethelene Hal for neck pain and has received injections. She has tried PT and dry needling, which has helped.   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 Amy [place]    URI 02/07/2007   Qualifier: Diagnosis of  By: Janit Bern      Past Surgical History:  Procedure Laterality Date   ABDOMINAL HYSTERECTOMY  1995   BREAST ENHANCEMENT SURGERY Bilateral    BREAST IMPLANT  REMOVAL Bilateral    with breast lift   CATARACT EXTRACTION W/ INTRAOCULAR LENS IMPLANT Bilateral 03/03/2017   cataract sx with lens implant   COLONOSCOPY  2007   colon--negative   NASAL SEPTUM SURGERY  1983   ROTATOR CUFF REPAIR Right 2000   TUBAL LIGATION  1978   WISDOM TOOTH EXTRACTION      Family History  Problem Relation Age of Onset   Hypertension Mother    Dementia Mother    Diabetes Father    Hypertension Father    Arthritis Father    Prostate cancer Father    Melanoma Brother    Colon cancer Neg Hx    Colon polyps Neg Hx    Esophageal cancer Neg Hx    Rectal cancer Neg Hx    Stomach cancer Neg Hx     Social History   Socioeconomic History   Marital status: Married    Spouse name: Not on file   Number of children: 2   Years of education: Not on file   Highest education level: Not on file  Occupational History   Occupation: Engineer, manufacturing systems: UNEMPLOYED    Comment: retired  Tobacco Use   Smoking status: Former  Packs/day: .3    Types: Cigarettes    Quit date: 03/07/1999    Years since quitting: 23.8   Smokeless tobacco: Never  Vaping Use   Vaping Use: Never used  Substance and Sexual Activity   Alcohol use: No    Alcohol/week: 0.0 standard drinks of alcohol   Drug use: No   Sexual activity: Never    Partners: Male  Other Topics Concern   Not on file  Social History Narrative   Not on file   Social Determinants of Health   Financial Resource Strain: Low Risk  (11/15/2021)   Overall Financial Resource Strain (CARDIA)    Difficulty of Paying Living Expenses: Not hard at all  Food Insecurity: No Food Insecurity (11/15/2021)   Hunger Vital Sign    Worried About Running Out of Food in the Last Year: Never true    Ran Out of Food in the Last Year: Never true  Transportation Needs: No Transportation Needs (11/15/2021)   PRAPARE - Administrator, Civil ServiceTransportation    Lack of Transportation (Medical): No    Lack of Transportation (Non-Medical): No  Physical  Activity: Sufficiently Active (11/15/2021)   Exercise Vital Sign    Days of Exercise per Week: 5 days    Minutes of Exercise per Session: 30 min  Stress: No Stress Concern Present (11/15/2021)   [employer] - Occupational Stress Questionnaire    Feeling of Stress : Not at all  Social Connections: Moderately Integrated (11/15/2021)   Social Connection and Isolation Panel [NHANES]    Frequency of Communication with Friends and Family: More than three times a week    Frequency of Social Gatherings with Friends and Family: More than three times a week    Attends Religious Services: More than 4 times per year    Active Member of [employer] Financial[employer]ubs or Organizations: No    Attends BankerClub or Organization Meetings: Never    Marital Status: Married  Catering managerntimate Partner Violence: Not At Risk (11/15/2021)   Humiliation, Afraid, Rape, and Kick questionnaire    Fear of Current or Ex-Partner: No    Emotionally Abused: No    Physically Abused: No    Sexually Abused: No    Outpatient Medications Prior to Visit  Medication Sig Dispense Refill   acetaminophen (TYLENOL) 500 MG tablet Take 500 mg by mouth every 6 (six) hours as needed.     budesonide-formoterol (SYMBICORT) 80-4.5 MCG/ACT inhaler Inhale 1 puff into the lungs in the morning and at bedtime. 10.2 g 12   [facility] FIBER GUMMIES PO Take 2 tablets by mouth daily. + B Vitamins     estradiol (VIVELLE-DOT) 0.05 MG/24HR patch Place onto the skin 2 (two) times a week.     eszopiclone (LUNESTA) 2 MG TABS tablet Take 2 mg by mouth daily.     albuterol (VENTOLIN HFA) 108 (90 Base) MCG/ACT inhaler Inhale 2 puffs into the lungs every 6 (six) hours as needed for wheezing or shortness of breath. 18 g 2   fluticasone (FLOVENT HFA) 44 MCG/ACT inhaler Inhale 2 puffs into the lungs 2 (two) times daily. 3 each 1   promethazine (PHENERGAN) 25 MG tablet Take 25 mg by mouth as needed.     spironolactone (ALDACTONE) 25 MG tablet TAKE 1 TABLET BY MOUTH EVERY DAY 90  tablet 1   SUMAtriptan (IMITREX) 100 MG tablet Take 1 tablet (100 mg total) by mouth as needed for migraine. May repeat in 2 hours if needed 9 tablet 4   temazepam (RESTORIL)  30 MG capsule 1 po qhs prn 90 capsule 1   valACYclovir (VALTREX) 1000 MG tablet Take 2 tabs and repeat in 12 hours in event of an outbreak. (Patient not taking: Reported on 09/06/2022) 30 tablet 1   No facility-administered medications prior to visit.    Allergies  Allergen Reactions   Avapro [Irbesartan] Cough   Codeine Other (See Comments)    Makes pt hyper    Metoprolol Cough    Metallic taste , nasal drainage    Norvasc [Amlodipine Besylate] Swelling    Review of Systems  Constitutional:  Negative for fever and malaise/fatigue.  HENT:  Negative for congestion.   Eyes:  Negative for blurred vision.  Respiratory:  Negative for cough and shortness of breath.   Cardiovascular:  Negative for chest pain, palpitations and leg swelling.  Gastrointestinal:  Negative for abdominal pain, blood in stool, nausea and vomiting.  Genitourinary:  Negative for dysuria and frequency.  Musculoskeletal:  Negative for back pain and falls.  Skin:  Negative for rash.  Neurological:  Positive for headaches (migraines). Negative for dizziness and loss of consciousness.  Endo/Heme/Allergies:  Negative for environmental allergies.  Psychiatric/Behavioral:  Negative for depression. The patient is not nervous/anxious.        Objective:    Physical Exam Vitals and nursing note reviewed.  Constitutional:      Appearance: She is well-developed.  HENT:     Head: Normocephalic and atraumatic.  Eyes:     Conjunctiva/sclera: Conjunctivae normal.  Neck:     Thyroid: No thyromegaly.     Vascular: No carotid bruit or JVD.  Cardiovascular:     Rate and Rhythm: Normal rate and regular rhythm.     Heart sounds: Normal heart sounds. No murmur heard. Pulmonary:     Effort: Pulmonary effort is normal. No respiratory distress.      Breath sounds: Normal breath sounds. No wheezing or rales.  Chest:     Chest wall: No tenderness.  Musculoskeletal:     Cervical back: Normal range of motion and neck supple.  Neurological:     General: No focal deficit present.     Mental Status: She is alert and oriented to person, place, and time. Mental status is at baseline.     Motor: No weakness.     Gait: Gait normal.     BP 130/80 (BP Location: Right Arm, Patient Position: Sitting, Cuff Size: Normal)   Pulse 72   Temp 98.1 F (36.7 C) (Oral)   Resp 18   Ht 5\' 4"  (1.626 m)   Wt 156 lb 12.8 oz (71.1 kg)   SpO2 98%   BMI 26.91 kg/m  Wt Readings from Last 3 Encounters:  01/09/23 156 lb 12.8 oz (71.1 kg)  09/06/22 153 lb 6.4 oz (69.6 kg)  05/12/22 154 lb 8 oz (70.1 kg)       Assessment & Plan:  Other migraine without status migrainosus, not intractable Assessment & Plan: Worsening migraines  Mri brain Check labs  Health Maintenance  Topic Date Due   MAMMOGRAM  01/07/2022   Medicare Annual Wellness (AWV)  11/15/2022   INFLUENZA VACCINE  05/04/2023   COLONOSCOPY (Pts 45-52yrs Insurance coverage will need to be confirmed)  04/12/2026   DTaP/Tdap/Td (2 - Tdap) 07/02/2030   Pneumonia Vaccine 16+ Years old  Completed   DEXA SCAN  Completed   COVID-19 Vaccine  Completed   Hepatitis C Screening  Completed   Zoster Vaccines- Shingrix  Completed   HPV  VACCINES  Aged Out     Orders: -     MR BRAIN WO CONTRAST; Future -     CBC with Differential/Platelet -     Comprehensive metabolic panel -     Sedimentation rate -     Promethazine HCl; Take 1 tablet (25 mg total) by mouth as needed.  Dispense: 30 tablet; Refill: 1 -     SUMAtriptan Succinate; Take 1 tablet (100 mg total) by mouth as needed for migraine. May repeat in 2 hours if needed  Dispense: 9 tablet; Refill: 4  Cervical radiculopathy Assessment & Plan: ? May be contributing to migraines  Con't with ortho f/u      I,Rachel Rivera,acting as a scribe  for [name], DO.,have documented all relevant documentation on the behalf of [name], DO,as directed by  [name], DO while in the presence of [name], DO.   I, [name], DO, personally preformed the services described in this documentation.  All medical record entries made by the scribe were at my direction and in my presence.  I have reviewed the chart and discharge instructions (if applicable) and agree that the record reflects my personal performance and is accurate and complete. 01/09/23   [name], DO

## 2023-01-09 NOTE — Patient Instructions (Signed)

## 2023-01-10 ENCOUNTER — Telehealth: Payer: Self-pay | Admitting: Family Medicine

## 2023-01-10 LAB — CBC WITH DIFFERENTIAL/PLATELET
Basophils Absolute: 0.1 10*3/uL (ref 0.0–0.1)
Basophils Relative: 0.8 % (ref 0.0–3.0)
Eosinophils Absolute: 0.1 10*3/uL (ref 0.0–0.7)
Eosinophils Relative: 1.5 % (ref 0.0–5.0)
HCT: 40.5 % (ref 36.0–46.0)
Hemoglobin: 13.4 g/dL (ref 12.0–15.0)
Lymphocytes Relative: 17.6 % (ref 12.0–46.0)
Lymphs Abs: 1.5 10*3/uL (ref 0.7–4.0)
MCHC: 33 g/dL (ref 30.0–36.0)
MCV: 95.8 fl (ref 78.0–100.0)
Monocytes Absolute: 0.8 10*3/uL (ref 0.1–1.0)
Monocytes Relative: 9.8 % (ref 3.0–12.0)
Neutro Abs: 5.9 10*3/uL (ref 1.4–7.7)
Neutrophils Relative %: 70.3 % (ref 43.0–77.0)
Platelets: 285 10*3/uL (ref 150.0–400.0)
RBC: 4.23 Mil/uL (ref 3.87–5.11)
RDW: 13.6 % (ref 11.5–15.5)
WBC: 8.4 10*3/uL (ref 4.0–10.5)

## 2023-01-10 LAB — COMPREHENSIVE METABOLIC PANEL
ALT: 6 U/L (ref 0–35)
AST: 18 U/L (ref 0–37)
Albumin: 4.3 g/dL (ref 3.5–5.2)
Alkaline Phosphatase: 72 U/L (ref 39–117)
BUN: 14 mg/dL (ref 6–23)
CO2: 25 mEq/L (ref 19–32)
Calcium: 9.8 mg/dL (ref 8.4–10.5)
Chloride: 102 mEq/L (ref 96–112)
Creatinine, Ser: 0.95 mg/dL (ref 0.40–1.20)
GFR: 59.1 mL/min — ABNORMAL LOW (ref >60.00)
Glucose, Bld: 77 mg/dL (ref 70–99)
Potassium: 3.9 mEq/L (ref 3.5–5.1)
Sodium: 139 mEq/L (ref 135–145)
Total Bilirubin: 0.6 mg/dL (ref 0.2–1.2)
Total Protein: 6.4 g/dL (ref 6.0–8.3)

## 2023-01-10 LAB — SEDIMENTATION RATE: Sed Rate: 4 mm/hr (ref 0–30)

## 2023-01-10 NOTE — Telephone Encounter (Signed)
Contacted Suezanne Jacquet to schedule their annual wellness visit. Appointment made for 01/19/2023.  Verlee Rossetti; Care Guide Ambulatory Clinical Support [place] [facility] Direct Dial: [phone]

## 2023-01-11 DIAGNOSIS — D692 Other nonthrombocytopenic purpura: Secondary | ICD-10-CM | POA: Diagnosis not present

## 2023-01-11 DIAGNOSIS — C4441 Basal cell carcinoma of skin of scalp and neck: Secondary | ICD-10-CM | POA: Diagnosis not present

## 2023-01-11 DIAGNOSIS — H43813 Vitreous degeneration, bilateral: Secondary | ICD-10-CM | POA: Diagnosis not present

## 2023-01-11 DIAGNOSIS — C44612 Basal cell carcinoma of skin of right upper limb, including shoulder: Secondary | ICD-10-CM | POA: Diagnosis not present

## 2023-01-11 DIAGNOSIS — D485 Neoplasm of uncertain behavior of skin: Secondary | ICD-10-CM | POA: Diagnosis not present

## 2023-01-11 DIAGNOSIS — Z961 Presence of intraocular lens: Secondary | ICD-10-CM | POA: Diagnosis not present

## 2023-01-11 DIAGNOSIS — Z85828 Personal history of other malignant neoplasm of skin: Secondary | ICD-10-CM | POA: Diagnosis not present

## 2023-01-11 DIAGNOSIS — L821 Other seborrheic keratosis: Secondary | ICD-10-CM | POA: Diagnosis not present

## 2023-01-11 DIAGNOSIS — H40013 Open angle with borderline findings, low risk, bilateral: Secondary | ICD-10-CM | POA: Diagnosis not present

## 2023-01-11 DIAGNOSIS — H26493 Other secondary cataract, bilateral: Secondary | ICD-10-CM | POA: Diagnosis not present

## 2023-01-11 DIAGNOSIS — I788 Other diseases of capillaries: Secondary | ICD-10-CM | POA: Diagnosis not present

## 2023-01-14 ENCOUNTER — Ambulatory Visit (HOSPITAL_BASED_OUTPATIENT_CLINIC_OR_DEPARTMENT_OTHER)
Admission: RE | Admit: 2023-01-14 | Discharge: 2023-01-14 | Disposition: A | Discharge status: Home / Self Care | Payer: Medicare HMO | Source: Ambulatory Visit | Attending: Family Medicine | Admitting: Family Medicine

## 2023-01-14 DIAGNOSIS — G43809 Other migraine, not intractable, without status migrainosus: Secondary | ICD-10-CM | POA: Diagnosis not present

## 2023-01-14 DIAGNOSIS — R519 Headache, unspecified: Secondary | ICD-10-CM | POA: Diagnosis not present

## 2023-01-16 DIAGNOSIS — N958 Other specified menopausal and perimenopausal disorders: Secondary | ICD-10-CM | POA: Diagnosis not present

## 2023-01-16 DIAGNOSIS — Z01419 Encounter for gynecological examination (general) (routine) without abnormal findings: Secondary | ICD-10-CM | POA: Diagnosis not present

## 2023-01-16 DIAGNOSIS — Z6827 Body mass index (BMI) 27.0-27.9, adult: Secondary | ICD-10-CM | POA: Diagnosis not present

## 2023-01-16 DIAGNOSIS — Z1231 Encounter for screening mammogram for malignant neoplasm of breast: Secondary | ICD-10-CM | POA: Diagnosis not present

## 2023-01-19 ENCOUNTER — Other Ambulatory Visit: Payer: Self-pay | Admitting: Family Medicine

## 2023-01-19 ENCOUNTER — Telehealth: Payer: Self-pay | Admitting: Family Medicine

## 2023-01-19 ENCOUNTER — Other Ambulatory Visit: Payer: Self-pay | Admitting: Obstetrics and Gynecology

## 2023-01-19 ENCOUNTER — Ambulatory Visit (INDEPENDENT_AMBULATORY_CARE_PROVIDER_SITE_OTHER): Payer: Medicare HMO

## 2023-01-19 VITALS — Ht 63.0 in | Wt 152.0 lb

## 2023-01-19 DIAGNOSIS — G43809 Other migraine, not intractable, without status migrainosus: Secondary | ICD-10-CM

## 2023-01-19 DIAGNOSIS — Z0000 Encounter for general adult medical examination without abnormal findings: Secondary | ICD-10-CM

## 2023-01-19 DIAGNOSIS — R928 Other abnormal and inconclusive findings on diagnostic imaging of breast: Secondary | ICD-10-CM

## 2023-01-19 MED ORDER — NURTEC 75 MG PO TBDP
1.0000 | ORAL_TABLET | Freq: Every day | ORAL | 2 refills | Status: DC
Start: 2023-01-19 — End: 2023-04-04

## 2023-01-19 NOTE — Telephone Encounter (Signed)
Patient would like to get an update regarding a new medication Dr. Laury Axon was going to send her for her migraines. She stated the medication can often be too expensive so she's not sure if it will be covered by her insurance. Please advise.

## 2023-01-19 NOTE — Patient Instructions (Signed)
Laura Hopkins , Thank you for taking time to come for your Medicare Wellness Visit. I appreciate your ongoing commitment to your health goals. Please review the following plan we discussed and let me know if I can assist you in the future.   These are the goals we discussed:  Goals      Chronic Care Management Pharmacy Care Plan     CARE PLAN ENTRY (see longitudinal plan of care for additional care plan information)  Current Barriers:  Chronic Disease Management support, education, and care coordination needs related to Hypertension, Hyperlipidemia, Migraine, Insomnia, Estrogen Deficiency, Osteopenia   Hypertension BP Readings from Last 3 Encounters:  10/01/20 128/86  09/29/20 125/84  09/15/20 100/80  Pharmacist Clinical Goal(s): Over the next 180 days, patient will work with PharmD and providers to maintain BP goal <140/90 Current regimen:  Spironolactone  daily Interventions: Discussed BP goal Patient self care activities - Over the next 180 days, patient will: Maintain hypertension medication regimen.   Hyperlipidemia Lab Results  Component Value Date/Time   LDLCALC 95 07/02/2020 11:39 AM  Pharmacist Clinical Goal(s): Over the next 180 days, patient will work with PharmD and providers to maintain LDL goal < 100 Current regimen:  Diet and exercise management   Interventions: Discussed LDL goal Patient self care activities - Over the next 180 days, patient will: Maintain LDL less than 100  Osteopenia Pharmacist Clinical Goal(s) Over the next 90 days, patient will work with PharmD and providers to reduce risk of fracture due to osteopenia Current regimen:  None Interventions: Discussed intake of  of calcium daily through diet and/or supplementation Discussed intake of [phone] units of vitamin D through supplementation  Patient self care activities - Over the next 90 days, patient will: Consider intake of  of calcium daily through diet and/or  supplementation Consider intake of [phone] units of vitamin D through supplementation  Migraine Pharmacist Clinical Goal(s) Over the next 90 days, patient will work with PharmD and providers to reduce symptoms associated with migraines Current regimen:  Sumatriptan  as needed  Acetaminophen   Naproxen   Interventions: Discussed aimovig patient assistance application Patient self care activities - Over the next 90 days, patient will: Consider completing aimovig patient assistance application  Medication management Pharmacist Clinical Goal(s): Over the next 180 days, patient will work with PharmD and providers to maintain optimal medication adherence [facility] Interventions Comprehensive medication review performed. Continue current medication management strategy Patient self care activities - Over the next 180 days, patient will: Focus on medication adherence by filling and taking medications appropriately  Take medications as prescribed Report any questions or concerns to PharmD and/or provider(s)  Initial goal documentation      Eat more fruits and vegetables     Increase physical activity      Walking         This is a list of the screening recommended for you and due dates:  Health Maintenance  Topic Date Due   Mammogram  01/07/2022   Flu Shot  05/04/2023   Medicare Annual Wellness Visit  01/19/2024   Colon Cancer Screening  04/12/2026   DTaP/Tdap/Td vaccine (2 - Tdap) 07/02/2030   Pneumonia Vaccine  Completed   DEXA scan (bone density measurement)  Completed   COVID-19 Vaccine  Completed   Hepatitis C Screening: USPSTF Recommendation to screen - Ages 80-79 yo.  Completed   Zoster (Shingles) Vaccine  Completed   HPV Vaccine  Aged Out    Advanced directives: Discussed  with patient  Conditions/risks identified:  Keep up the good work  Next appointment: Follow up in one year for your annual wellness visit    Preventive Care 65  Years and Older, Female Preventive care refers to lifestyle choices and visits with your health care provider that can promote health and wellness. What does preventive care include? A yearly physical exam. This is also called an annual well check. Dental exams once or twice a year. Routine eye exams. Ask your health care provider how often you should have your eyes checked. Personal lifestyle choices, including: Daily care of your teeth and gums. Regular physical activity. Eating a healthy diet. Avoiding tobacco and drug use. Limiting alcohol use. Practicing safe sex. Taking low-dose aspirin every day. Taking vitamin and mineral supplements as recommended by your health care provider. What happens during an annual well check? The services and screenings done by your health care provider during your annual well check will depend on your age, overall health, lifestyle risk factors, and family history of disease. Counseling  Your health care provider may ask you questions about your: Alcohol use. Tobacco use. Drug use. Emotional well-being. Home and relationship well-being. Sexual activity. Eating habits. History of falls. Memory and ability to understand (cognition). Work and work Astronomer. Reproductive health. Screening  You may have the following tests or measurements: Height, weight, and BMI. Blood pressure. Lipid and cholesterol levels. These may be checked every 5 years, or more frequently if you are over 44 years old. Skin check. Lung cancer screening. You may have this screening every year starting at age 81 if you have a 30-pack-year history of smoking and currently smoke or have quit within the past 15 years. Fecal occult blood test (FOBT) of the stool. You may have this test every year starting at age 41. Flexible sigmoidoscopy or colonoscopy. You may have a sigmoidoscopy every 5 years or a colonoscopy every 10 years starting at age 42. Hepatitis C blood  test. Hepatitis B blood test. Sexually transmitted disease (STD) testing. Diabetes screening. This is done by checking your blood sugar (glucose) after you have not eaten for a while (fasting). You may have this done every 1-3 years. Bone density scan. This is done to screen for osteoporosis. You may have this done starting at age 58. Mammogram. This may be done every 1-2 years. Talk to your health care provider about how often you should have regular mammograms. Talk with your health care provider about your test results, treatment options, and if necessary, the need for more tests. Vaccines  Your health care provider may recommend certain vaccines, such as: Influenza vaccine. This is recommended every year. Tetanus, diphtheria, and acellular pertussis (Tdap, Td) vaccine. You may need a Td booster every 10 years. Zoster vaccine. You may need this after age 34. Pneumococcal 13-valent conjugate (PCV13) vaccine. One dose is recommended after age 72. Pneumococcal polysaccharide (PPSV23) vaccine. One dose is recommended after age 21. Talk to your health care provider about which screenings and vaccines you need and how often you need them. This information is not intended to replace advice given to you by your health care provider. Make sure you discuss any questions you have with your health care provider. Document Released: 10/16/2015 Document Revised: 06/08/2016 Document Reviewed: 07/21/2015 Elsevier Interactive Patient Education  2017 [employer].  Fall Prevention in the Home Falls can cause injuries. They can happen to people of all ages. There are many things you can do to make your home safe and  to help prevent falls. What can I do on the outside of my home? Regularly fix the edges of walkways and driveways and fix any cracks. Remove anything that might make you trip as you walk through a door, such as a raised step or threshold. Trim any bushes or trees on the path to your home. Use  bright outdoor lighting. Clear any walking paths of anything that might make someone trip, such as rocks or tools. Regularly check to see if handrails are loose or broken. Make sure that both sides of any steps have handrails. Any raised decks and porches should have guardrails on the edges. Have any leaves, snow, or ice cleared regularly. Use sand or salt on walking paths during winter. Clean up any spills in your garage right away. This includes oil or grease spills. What can I do in the bathroom? Use night lights. Install grab bars by the toilet and in the tub and shower. Do not use towel bars as grab bars. Use non-skid mats or decals in the tub or shower. If you need to sit down in the shower, use a plastic, non-slip stool. Keep the floor dry. Clean up any water that spills on the floor as soon as it happens. Remove soap buildup in the tub or shower regularly. Attach bath mats securely with double-sided non-slip rug tape. Do not have throw rugs and other things on the floor that can make you trip. What can I do in the bedroom? Use night lights. Make sure that you have a light by your bed that is easy to reach. Do not use any sheets or blankets that are too big for your bed. They should not hang down onto the floor. Have a firm chair that has side arms. You can use this for support while you get dressed. Do not have throw rugs and other things on the floor that can make you trip. What can I do in the kitchen? Clean up any spills right away. Avoid walking on wet floors. Keep items that you use a lot in easy-to-reach places. If you need to reach something above you, use a strong step stool that has a grab bar. Keep electrical cords out of the way. Do not use floor polish or wax that makes floors slippery. If you must use wax, use non-skid floor wax. Do not have throw rugs and other things on the floor that can make you trip. What can I do with my stairs? Do not leave any items on the  stairs. Make sure that there are handrails on both sides of the stairs and use them. Fix handrails that are broken or loose. Make sure that handrails are as long as the stairways. Check any carpeting to make sure that it is firmly attached to the stairs. Fix any carpet that is loose or worn. Avoid having throw rugs at the top or bottom of the stairs. If you do have throw rugs, attach them to the floor with carpet tape. Make sure that you have a light switch at the top of the stairs and the bottom of the stairs. If you do not have them, ask someone to add them for you. What else can I do to help prevent falls? Wear shoes that: Do not have high heels. Have rubber bottoms. Are comfortable and fit you well. Are closed at the toe. Do not wear sandals. If you use a stepladder: Make sure that it is fully opened. Do not climb a closed stepladder. Make  sure that both sides of the stepladder are locked into place. Ask someone to hold it for you, if possible. Clearly mark and make sure that you can see: Any grab bars or handrails First and last steps. Where the edge of each step is. Use tools that help you move around (mobility aids) if they are needed. These include: Canes. Walkers. Scooters. Crutches. Turn on the lights when you go into a dark area. Replace any light bulbs as soon as they burn out. Set up your furniture so you have a clear path. Avoid moving your furniture around. If any of your floors are uneven, fix them. If there are any pets around you, be aware of where they are. Review your medicines with your doctor. Some medicines can make you feel dizzy. This can increase your chance of falling. Ask your doctor what other things that you can do to help prevent falls. This information is not intended to replace advice given to you by your health care provider. Make sure you discuss any questions you have with your health care provider. Document Released: 07/16/2009 Document Revised:  02/25/2016 Document Reviewed: 10/24/2014 Elsevier Interactive Patient Education  2017 [employer].

## 2023-01-19 NOTE — Progress Notes (Signed)
Subjective:   Laura Hopkins is a 77 y.o. female who presents for Medicare Annual (Subsequent) preventive examination.  I connected with  Suezanne Jacquet on 01/19/23 by a audio enabled telemedicine application and verified that I am speaking with the correct person using two identifiers.  Patient Location: Home  Provider Location: Home Office  I discussed the limitations of evaluation and management by telemedicine. The patient expressed understanding and agreed to proceed.    Review of Systems     Cardiac Risk Factors include: advanced age (>32men, >62 women);dyslipidemia     Objective:    Today's Vitals   01/19/23 1604  Weight: 152 lb (68.9 kg)  Height:  (1.6 m)   Body mass index is 26.93 kg/m.     01/19/2023    3:42 PM 11/15/2021    1:05 PM 09/01/2020   12:28 PM 08/14/2017    9:24 AM 08/08/2016    8:38 AM 04/12/2016   12:53 PM 03/23/2016   10:36 AM  Advanced Directives  Does Patient Have a Medical Advance Directive? Yes Yes Yes Yes Yes Yes Yes  Type of Estate agent of [place];Living will Healthcare Power of [employer] of [place];Living will Healthcare Power of [place];Living will Healthcare Power of [place];Living will Living will;Healthcare Power of Attorney Living will;Healthcare Power of Attorney  Does patient want to make changes to medical advance directive? No - Patient declined        Copy of Healthcare Power of Attorney in Chart? Yes - validated most recent copy scanned in chart (See row information) Yes - validated most recent copy scanned in chart (See row information)  Yes No - copy requested No - copy requested   Would patient like information on creating a medical advance directive?   No - Patient declined        Current Medications (verified) Outpatient Encounter Medications as of 01/19/2023  Medication Sig   acetaminophen (TYLENOL) 500 MG tablet Take 500 mg by mouth every 6 (six) hours as needed.    budesonide-formoterol (SYMBICORT) 80-4.5 MCG/ACT inhaler Inhale 1 puff into the lungs in the morning and at bedtime.   [employer] FIBER GUMMIES PO Take 2 tablets by mouth daily. + B Vitamins   estradiol (VIVELLE-DOT) 0.05 MG/24HR patch Place onto the skin 2 (two) times a week.   eszopiclone (LUNESTA) 2 MG TABS tablet Take 2 mg by mouth daily.   promethazine (PHENERGAN) 25 MG tablet Take 1 tablet (25 mg total) by mouth as needed.   SUMAtriptan (IMITREX) 100 MG tablet Take 1 tablet (100 mg total) by mouth as needed for migraine. May repeat in 2 hours if needed   No facility-administered encounter medications on file as of 01/19/2023.    Allergies (verified) Avapro [irbesartan], Codeine, Metoprolol, and Norvasc [amlodipine besylate]   History: Past Medical History:  Diagnosis Date   Acute pharyngitis 02/26/2009   Qualifier: Diagnosis of  By: Janit Bern     Arthritis    hands and neck    Asthma    Essential hypertension 02/07/2007   Qualifier: Diagnosis of  By: Loreli Slot)    HTN (hypertension)    Insomnia 12/23/2017   Lower extremity edema 07/31/2017   Migraine 05/23/2017   Pneumonia    SINUSITIS - ACUTE-NOS 03/17/2009   Qualifier: Diagnosis of  By: Janit Bern     Skin cancer    basal cell   SKIN CANCER, HX OF 01/08/2008  Annotation: BSC and SCC Qualifier: Diagnosis of  By: Janit Bern   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 Amy Swaziland    URI 02/07/2007   Qualifier: Diagnosis of  By: Janit Bern     Past Surgical History:  Procedure Laterality Date   ABDOMINAL HYSTERECTOMY  1995   BREAST ENHANCEMENT SURGERY Bilateral    BREAST IMPLANT REMOVAL Bilateral    with breast lift   CATARACT EXTRACTION W/ INTRAOCULAR LENS IMPLANT Bilateral 03/03/2017   cataract sx with lens implant   COLONOSCOPY  2007   colon--negative   NASAL SEPTUM SURGERY  1983   ROTATOR CUFF REPAIR Right 2000   TUBAL LIGATION  1978   WISDOM  TOOTH EXTRACTION     Family History  Problem Relation Age of Onset   Hypertension Mother    Dementia Mother    Diabetes Father    Hypertension Father    Arthritis Father    Prostate cancer Father    Melanoma Brother    Colon cancer Neg Hx    Colon polyps Neg Hx    Esophageal cancer Neg Hx    Rectal cancer Neg Hx    Stomach cancer Neg Hx    Social History   Socioeconomic History   Marital status: Married    Spouse name: Not on file   Number of children: 2   Years of education: Not on file   Highest education level: Not on file  Occupational History   Occupation: Engineer, manufacturing systems: UNEMPLOYED    Comment: retired  Tobacco Use   Smoking status: Former    Packs/day: .3    Types: Cigarettes    Quit date: 03/07/1999    Years since quitting: 23.8   Smokeless tobacco: Never  Vaping Use   Vaping Use: Never used  Substance and Sexual Activity   Alcohol use: No    Alcohol/week: 0.0 standard drinks of alcohol   Drug use: No   Sexual activity: Never    Partners: Male  Other Topics Concern   Not on file  Social History Narrative   Not on file   Social Determinants of Health   Financial Resource Strain: Low Risk  (01/19/2023)   Overall Financial Resource Strain (CARDIA)    Difficulty of Paying Living Expenses: Not hard at all  Food Insecurity: No Food Insecurity (01/19/2023)   Hunger Vital Sign    Worried About Running Out of Food in the Last Year: Never true    Ran Out of Food in the Last Year: Never true  Transportation Needs: No Transportation Needs (01/19/2023)   PRAPARE - Administrator, Civil Service (Medical): No    Lack of Transportation (Non-Medical): No  Physical Activity: Sufficiently Active (01/19/2023)   Exercise Vital Sign    Days of Exercise per Week: 6 days    Minutes of Exercise per Session: 50 min  Stress: No Stress Concern Present (01/19/2023)   [employer] of Occupational Health - Occupational Stress Questionnaire    Feeling  of Stress : Not at all  Social Connections: Socially Integrated (01/19/2023)   Social Connection and Isolation Panel [NHANES]    Frequency of Communication with Friends and Family: More than three times a week    Frequency of Social Gatherings with Friends and Family: More than three times a week    Attends Religious Services: More than 4 times per year    Active Member of [employer] or Organizations: Yes  Attends Engineer, structural: More than 4 times per year    Marital Status: Married    Tobacco Counseling Counseling given: Not Answered   Clinical Intake:  Pre-visit preparation completed: Yes  Pain : No/denies pain     BMI - recorded: 26.93 Nutritional Status: BMI 25 -29 Overweight Nutritional Risks: None Diabetes: No  How often do you need to have someone help you when you read instructions, pamphlets, or other written materials from your doctor or pharmacy?: 1 - Never  Diabetic? No  Interpreter Needed?: No  Information entered by :: Kandis Cocking, CMA   Activities of Daily Living    01/19/2023    3:42 PM  In your present state of health, do you have any difficulty performing the following activities:  Hearing? 0  Vision? 0  Difficulty concentrating or making decisions? 0  Walking or climbing stairs? 0  Dressing or bathing? 0  Doing errands, shopping? 0  Preparing Food and eating ? N  Using the Toilet? N  In the past six months, have you accidently leaked urine? N  Do you have problems with loss of bowel control? N  Managing your Medications? N  Managing your Finances? N  Housekeeping or managing your Housekeeping? N    Patient Care Team: Zola Button, Grayling Congress, DO as PCP - General Dulce Sellar Iline Oven, MD as PCP - Cardiology (Cardiology) Marcelle Overlie, MD as Consulting Physician (Obstetrics and Gynecology) Sw[place], Amy, MD as Consulting Physician (Dermatology) Dohmeier, Porfirio Mylar, MD as Consulting Physician (Neurology) Day, Dannielle Karvonen, [facility] (Inactive) as  Pharmacist (Pharmacist)  Indicate any recent Medical Services you may have received from other than [facility] providers in the past year (date may be approximate).     Assessment:   This is a routine wellness examination for Shelie.  Hearing/Vision screen Hearing Screening - Comments:: Denies hearing difficulties   Vision Screening - Comments:: Wears rx glasses - up to date with routine eye exams with  April 10th.   [facility]  Dietary issues and exercise activities discussed: Current Exercise Habits: Home exercise routine, Type of exercise: walking, Time (Minutes): 50, Frequency (Times/Week): 6, Weekly Exercise (Minutes/Week): 300, Intensity: Mild   Goals Addressed             This Visit's Progress    Increase physical activity   On track     Walking        Depression Screen    01/19/2023    3:44 PM 04/08/2022    2:47 PM 11/15/2021    1:07 PM 07/28/2021   11:11 AM 04/30/2021   10:32 AM 07/02/2020   10:51 AM 02/12/2018   10:26 AM  PHQ 2/9 Scores  PHQ - 2 Score 0 0 0 0 0 0 3  PHQ- 9 Score  0  0   10    Fall Risk    01/19/2023    4:16 PM 01/19/2023    3:40 PM 11/15/2021    1:05 PM 07/28/2021   11:14 AM 04/30/2021   10:32 AM  Fall Risk   Falls in the past year? 0 0 0 0 0  Number falls in past yr: 0 0 0 0 0  Injury with Fall? 0 0 0 0 0  Risk for fall due to : No Fall Risks No Fall Risks     Follow up Falls prevention discussed Falls prevention discussed Falls prevention discussed Falls evaluation completed     FALL RISK PREVENTION PERTAINING TO THE HOME:  Any stairs in or  around the home? No  If so, are there any without handrails? No  Home free of loose throw rugs in walkways, pet beds, electrical cords, etc? Yes  Adequate lighting in your home to reduce risk of falls? Yes   ASSISTIVE DEVICES UTILIZED TO PREVENT FALLS:  Life alert? No  Use of a cane, walker or w/c? No  Grab bars in the bathroom? No  Shower chair or bench in shower? Yes  Elevated toilet seat or a  handicapped toilet? No   TIMED UP AND GO:  Was the test performed? No.     Cognitive Function:    08/14/2017    9:26 AM 08/08/2016    8:40 AM 08/06/2015    1:27 PM  MMSE - Mini Mental State Exam  Orientation to time Orientation to Place Registration Attention/ Calculation Recall Language- name 2 objects Language- repeat Language- follow 3 step command Language- read & follow direction Write a sentence Copy design Total score 01/19/2023    3:44 PM  6CIT Screen  What Year? 0 points  What month? 0 points  What time? 0 points  Count back from 20 0 points  Months in reverse 0 points  Repeat phrase 0 points  Total Score 0 points    Immunizations Immunization History  Administered Date(s) Administered   COVID-19, mRNA, vaccine(Comirnaty)12 years and older 08/12/2022   Fluad Quad(high Dose 65+) 07/04/2019, 07/02/2020, 07/12/2021, 07/29/2022   Influenza Split 07/24/2017   Influenza Whole 07/03/2006, 09/11/2007, 07/15/2008, 07/30/2009   Influenza, High Dose Seasonal PF 08/27/2014, 08/06/2015, 07/24/2017   Influenza,inj,Quad PF,6+ Mos 08/12/2013, 07/20/2018   PFIZER(Purple Top)SARS-COV-2 Vaccination 10/24/2019, 11/14/2019, 07/14/2020   Pfizer Covid-19 Vaccine Bivalent Booster 64yrs & up 08/02/2021   Pneumococcal Conjugate-13 10/02/2015   Pneumococcal Polysaccharide-23 03/27/2014   Td 07/02/2020   Zoster Recombinat (Shingrix) 06/18/2018, 08/19/2018    TDAP status: Up to date  Flu Vaccine status: Up to date  Pneumococcal vaccine status: Up to date  Covid-19 vaccine status: Completed vaccines  Qualifies for Shingles Vaccine? Yes   Zostavax completed No   Shingrix Completed?: Yes  Screening Tests Health Maintenance  Topic Date Due   MAMMOGRAM  01/07/2022   INFLUENZA VACCINE  05/04/2023   Medicare Annual Wellness (AWV)  01/19/2024   COLONOSCOPY (Pts 45-70yrs Insurance  coverage will need to be confirmed)  04/12/2026   DTaP/Tdap/Td (2 - Tdap) 07/02/2030   Pneumonia Vaccine 22+ Years old  Completed   DEXA SCAN  Completed   COVID-19 Vaccine  Completed   Hepatitis C Screening  Completed   Zoster Vaccines- Shingrix  Completed   HPV VACCINES  Aged Out    Health Maintenance  Health Maintenance Due  Topic Date Due   MAMMOGRAM  01/07/2022    Colorectal cancer screening: Type of screening: Colonoscopy. Completed 04/18/2016   . Repeat every 10    years  Mammogram status: Ordered  . Pt provided with contact info and advised to call to schedule appt.    Bone Density- Completed on 01/16/23 per patient   Lung Cancer Screening: (Low Dose CT Chest recommended if Age 24-80 years, 30 pack-year currently smoking OR have quit w/in 15years.) does not qualify.   Lung Cancer  Screening Referral: N/A  Additional Screening:  Hepatitis C Screening: does qualify; Completed 10/02/2015  Vision Screening: Recommended annual ophthalmology exams for early detection of glaucoma and other disorders of the eye.  Is the patient up to date with their annual eye exam?  Yes   Who is the provider or what is the name of the office in which the patient attends annual eye exams?    [facility]  If pt is not established with a provider, would they like to be referred to a provider to establish care? No .   Dental Screening: Recommended annual dental exams for proper oral hygiene  Community Resource Referral / Chronic Care Management: CRR required this visit?  No   CCM required this visit?  No      Plan:     I have personally reviewed and noted the following in the patient's chart:   Medical and social history Use of alcohol, tobacco or illicit drugs  Current medications and supplements including opioid prescriptions. Patient is not currently taking opioid prescriptions. Functional ability and status Nutritional status Physical activity Advanced directives List of  other physicians Hospitalizations, surgeries, and ER visits in previous 12 months Vitals Screenings to include cognitive, depression, and falls Referrals and appointments  In addition, I have reviewed and discussed with patient certain preventive protocols, quality metrics, and best practice recommendations. A written personalized care plan for preventive services as well as general preventive health recommendations were provided to patient.     Milus Mallick, CMA   01/19/2023   Nurse Notes: None

## 2023-01-19 NOTE — Progress Notes (Signed)
Pt has reviewed results and PCP comment via Mychart.

## 2023-01-20 NOTE — Telephone Encounter (Signed)
Rx sent by Dr. Lowne 

## 2023-01-26 ENCOUNTER — Encounter: Payer: Self-pay | Admitting: Family Medicine

## 2023-01-26 ENCOUNTER — Ambulatory Visit
Admission: RE | Admit: 2023-01-26 | Discharge: 2023-01-26 | Disposition: A | Discharge status: Home / Self Care | Payer: Medicare HMO | Source: Ambulatory Visit | Attending: Obstetrics and Gynecology | Admitting: Obstetrics and Gynecology

## 2023-01-26 DIAGNOSIS — R928 Other abnormal and inconclusive findings on diagnostic imaging of breast: Secondary | ICD-10-CM

## 2023-01-26 DIAGNOSIS — R921 Mammographic calcification found on diagnostic imaging of breast: Secondary | ICD-10-CM | POA: Diagnosis not present

## 2023-02-02 ENCOUNTER — Encounter: Payer: Self-pay | Admitting: Family Medicine

## 2023-02-02 ENCOUNTER — Telehealth: Payer: Self-pay

## 2023-02-02 NOTE — Telephone Encounter (Signed)
PA initiated via Covermymeds; KEY:  [id number]. Awaiting determination.

## 2023-02-02 NOTE — Telephone Encounter (Signed)
PA approved.   Your request has been approved Authorization Expiration Date: 10/03/2023 

## 2023-02-09 ENCOUNTER — Other Ambulatory Visit (HOSPITAL_COMMUNITY): Payer: Self-pay

## 2023-03-08 ENCOUNTER — Encounter: Payer: Self-pay | Admitting: Pulmonary Disease

## 2023-03-08 ENCOUNTER — Ambulatory Visit: Payer: Medicare HMO | Admitting: Pulmonary Disease

## 2023-03-08 VITALS — BP 116/74 | HR 67 | Temp 98.2°F | Ht 64.0 in | Wt 157.4 lb

## 2023-03-08 DIAGNOSIS — J4540 Moderate persistent asthma, uncomplicated: Secondary | ICD-10-CM | POA: Diagnosis not present

## 2023-03-08 DIAGNOSIS — R053 Chronic cough: Secondary | ICD-10-CM | POA: Diagnosis not present

## 2023-03-08 NOTE — Patient Instructions (Signed)
I am glad you are doing well with your breathing You can start using Symbicort as needed Follow-up in 6 months

## 2023-03-08 NOTE — Progress Notes (Signed)
Laura Hopkins    [id number]    02-26-1946  Primary Care Physician:Lowne Almeta Monas Grayling Congress, DO  Referring Physician: Zola Button, Grayling Congress, DO [address] [place],  [place] [address]  Chief complaint: Follow-up for asthma  HPI: 77 y.o. with history of OSA on CPAP, depression, hypertension, hysterectomy  C/O cough for the past 6 years. Originally thought to be secondary to ARB which was held Cough is non productive in nature with wheezing, congestion in morning. Dyspnea is increased with cold air. Started on singulair around summer 2022 with improvement but developed nausea so stopped.  Now she has return of cough  She has been prescribed Flovent which she uses only intermittently  Has allergic rhinitis and pos nasal drip. H/O deviated septum s/p surgery H/O OSA, stopped CPAP in 2021.   Evaluated by Dr. Lavon Paganini, GI for abdominal discomfort Continues on PPI and pepcid.  EGD on dec 2022 done showing hiatal hernia and normal stomach  Pets: Two dogs Occupation:Retired from postal service Exposures:No mold, hot tub jaccuzi, no down pillows or comforter Smoking history: Never smoker.  Travel history: No travel Relevant family history: No family history of lung disease  Interim history: Continues on Symbicort with good control of symptoms.  No new complaints today She is using only 1 puff of 80/4.4 once a day  Outpatient Encounter Medications as of 03/08/2023  Medication Sig   acetaminophen (TYLENOL) 500 MG tablet Take 500 mg by mouth every 6 (six) hours as needed.   budesonide-formoterol (SYMBICORT) 80-4.5 MCG/ACT inhaler Inhale 1 puff into the lungs in the morning and at bedtime.   estradiol (VIVELLE-DOT) 0.05 MG/24HR patch Place onto the skin 2 (two) times a week.   eszopiclone (LUNESTA) 2 MG TABS tablet Take 3 mg by mouth daily. Taking 3mg  at bedtime   promethazine (PHENERGAN) 25 MG tablet Take 1 tablet (25 mg total) by mouth as needed.   Rimegepant  Sulfate (NURTEC) 75 MG TBDP Take 1 tablet (75 mg total) by mouth daily.   SUMAtriptan (IMITREX) 100 MG tablet Take 1 tablet (100 mg total) by mouth as needed for migraine. May repeat in 2 hours if needed   [DISCONTINUED] [employer] FIBER GUMMIES PO Take 2 tablets by mouth daily. + B Vitamins   No facility-administered encounter medications on file as of 03/08/2023.   Physical Exam: Blood pressure 116/74, pulse 67, temperature 98.2 F (36.8 C), temperature source Oral, height 5\' 4"  (1.626 m), weight 157 lb 6.4 oz (71.4 kg), SpO2 98 %. Gen:      No acute distress HEENT:  EOMI, sclera anicteric Neck:     No masses; no thyromegaly Lungs:    Clear to auscultation bilaterally; normal respiratory effort CV:         Regular rate and rhythm; no murmurs Abd:      + bowel sounds; soft, non-tender; no palpable masses, no distension Ext:    No edema; adequate peripheral perfusion Skin:      Warm and dry; no rash Neuro: alert and oriented x 3 Psych: normal mood and affect   Data Reviewed: Imaging: Chest x-ray 11/07/2019-no acute cardiopulmonary abnormality I have reviewed the images personally.  PFTs: 09/15/2021 FVC 2.88 (22%), FEV1 2.27 [107%], F/F 79, TLC 5.13 [104%], DLCO 19.72 [95%] Normal test  ACT score 04/10/2022-24  Labs: CBC 04/30/2021-WBC 5.9, eos 5.3%, absolute eosinophil count 313 CBC 08/05/2021-WBC 7.3, eos 2.1%, absolute eosinophil count 153  Assessment:  Moderate  persistent asthma CBC reviewed with peripheral eosinophils She did have a good response to Singulair but developed nausea PFTs show normal lung function  We can probably further de-escalate the Symbicort as she is doing well.  Will go to use of Symbicort as needed  GERD Continue on PPI  Plan/Recommendations: Change Symbicort to as needed.  Chilton Greathouse MD [facility] 03/08/2023, 1:23 PM  CC: [name], *

## 2023-04-04 ENCOUNTER — Encounter: Payer: Self-pay | Admitting: Family Medicine

## 2023-04-04 ENCOUNTER — Ambulatory Visit (INDEPENDENT_AMBULATORY_CARE_PROVIDER_SITE_OTHER): Payer: Medicare HMO | Admitting: Family Medicine

## 2023-04-04 VITALS — BP 128/86 | HR 68 | Temp 98.0°F | Resp 16 | Ht 64.0 in | Wt 155.2 lb

## 2023-04-04 DIAGNOSIS — K219 Gastro-esophageal reflux disease without esophagitis: Secondary | ICD-10-CM | POA: Diagnosis not present

## 2023-04-04 DIAGNOSIS — G43809 Other migraine, not intractable, without status migrainosus: Secondary | ICD-10-CM

## 2023-04-04 MED ORDER — AIMOVIG 70 MG/ML ~~LOC~~ SOAJ
SUBCUTANEOUS | 5 refills | Status: DC
Start: 2023-04-04 — End: 2024-01-05

## 2023-04-04 NOTE — Patient Instructions (Signed)
Chronic Migraine Headache A migraine is a type of headache that is usually stronger and more sudden than other headaches. This type of headache feels like an intense pulsing or throbbing pain that is usually felt on one side of the head. It may also cause other symptoms, such as nausea, vomiting, and sensitivity to light and noise. A migraine is called a chronic migraine if it happens at least 15 days in a month for more than 3 months. Talk with your health care provider about what things may bring on (trigger) your migraines. What are the causes? The exact cause is not known. However, a migraine may be caused when nerves in the brain become irritated and release chemicals that cause blood vessels to become inflamed. This inflammation causes pain. Migraines may be triggered or caused by: Smoking. Medicines, such as birth control pills or some blood pressure medicines. Foods or drinks that contain nitrates, glutamate, aspartame, MSG, or tyramine. Certain foods or drinks, such as aged cheeses, chocolate, alcohol, or caffeine. Other triggers may include: Menstruation. Emotional stress. Getting too much or too little sleep. Tiredness (fatigue). Bright lights or loud noises. Certain smells. Weather changes and high altitude. What increases the risk? The following factors may make you more likely to have chronic migraines: Having migraines or a family history of migraines. Having a mental health condition, such as depression or anxiety. Taking a lot of pain medicine. Having sleep problems. Having heart disease, diabetes, or obesity. What are the signs or symptoms? Symptoms of a migraine vary for each person. The pain may: Be pulsing or throbbing. Happen on one side of the head. In some cases, the pain may be on both sides of the head or around the head or neck. Be so bad that it prevents you from doing daily activities. Get worse with physical activity. Cause nausea, vomiting, or both. Get  worse around bright lights, loud noises, or smells. Cause dizziness. A sign that your migraines are becoming chronic is when you start to have more and more migraine episodes. How is this diagnosed? Chronic migraines are often diagnosed based on: Your symptoms and medical history. A physical exam. You may also have tests, including: A CT scan or an MRI of your brain. These tests can help to rule out other causes of your headaches. Taking fluid from the spine (lumbar puncture) to examine it (cerebrospinal fluid analysis, or CSF analysis). Blood tests. How is this treated? Chronic migraines are treated with medicines that: Lessen pain and nausea. Prevent migraines. Treatment may also include: Acupuncture. Lifestyle changes, such as changes to your diet or sleep schedule. Learning ways to control your body and breathing (biofeedback) and relaxation training. Talk therapy to help you know and deal with negative thoughts (cognitive behavioral therapy). Using a device that provides electrical stimulation to your nerves, which can relieve pain (neuromodulation therapy). Injection of medicine into the muscles of the face or head. Surgery, if the other treatments do not work. Follow these instructions at home: Medicines Take over-the-counter and prescription medicines only as told by your provider. Ask your provider if the medicine prescribed to you requires you to avoid driving or using machinery. Lifestyle  Do not drink alcohol. Do not use any products that contain nicotine or tobacco. These products include cigarettes, chewing tobacco, and vaping devices, such as e-cigarettes. If you need help quitting, ask your provider. Get 7-9 hours of sleep every night, or the amount of sleep recommended by your provider. Find ways to manage stress, such  as meditation, deep breathing, or yoga. Maintain a healthy weight. If you need help losing weight, ask your provider. Exercise regularly. Aim for 150  minutes of moderate-intensity exercise, such as walking, biking, or yoga, or 75 minutes of vigorous exercise each week. Vigorous exercise includes running, circuit training, and swimming. General instructions Keep a journal to find out what triggers your migraines so you can avoid those things. For example, write down: What you eat and drink. How much sleep you get. Any change to your diet or medicines. If you have a migraine headache: Lie down in a dark, quiet room. Try placing a cool towel over your head. Keep lights dim if bright lights bother you or make your migraine worse. Where to find more information Coalition for Headache and Migraine Patients (CHAMP): headachemigraine.org American Migraine Foundation: americanmigrainefoundation.org National Headache Foundation: headaches.org Contact a health care provider if: Your pain does not get better even with medicine. Your migraines keep coming back even with medicine. Get help right away if: Your migraine becomes severe and medicine does not help. You have a fever or stiff neck. You have vision loss. Your muscles feel weak or like you cannot control them. You lose your balance often or have trouble walking. You feel like you may faint or you faint. You start having sudden and unexpected, severe headaches. You have a seizure. This information is not intended to replace advice given to you by your health care provider. Make sure you discuss any questions you have with your health care provider. Document Revised: 05/16/2022 Document Reviewed: 05/16/2022 Elsevier Patient Education  2024 [employer].

## 2023-04-04 NOTE — Progress Notes (Signed)
Established Patient Office Visit  Subjective   Patient ID: Laura Hopkins, female    DOB: 01-14-1946  Age: 77 y.o. MRN: [id number]  Chief Complaint  Patient presents with   Migraine    Pt states Nurtec is giving her reflux and states not helping with her headaches.    Follow-up    HPI Discussed the use of AI scribe software for clinical note transcription with the patient, who gave verbal consent to proceed.  History of Present Illness   The patient, with a history of migraines, presents with ongoing headaches despite current treatment with Nurtec. She reports experiencing approximately two migraines per week over the past two months. The Nurtec has also been causing significant reflux, particularly at night, requiring frequent use of Rolaids. The patient has previously been on Aimovig, which she found more effective, but discontinued due to cost. She is considering returning to Aimovig or trying a new medication, Qlipta. The patient also has a history of ADD, currently managed with Concerta and [place].      Patient Active Problem List   Diagnosis Date Noted   Cervical radiculopathy 01/09/2023   Seasonal allergies 07/12/2021   Preoperative examination 04/30/2021   External hemorrhoid 09/15/2020   Acute vaginitis 09/15/2020   Colitis 09/15/2020   Depression, major, single episode, mild (HCC) 03/23/2020   History of chronic cough 03/23/2020   OSA on CPAP 03/23/2020   Sleep related choking sensation 01/26/2020   Hyperlipidemia 12/26/2019   Diffuse wheezing 12/24/2019   Snoring 12/24/2019   Laryngopharyngeal reflux disease 12/24/2019   Coughing 12/24/2019   Bronchitis 11/06/2019   SOB (shortness of breath) 03/11/2019   Insomnia 12/23/2017   Lower extremity edema 07/31/2017   Migraine 05/23/2017   SINUSITIS - ACUTE-NOS 03/17/2009   ACUTE PHARYNGITIS 02/26/2009   SKIN CANCER, HX OF 01/08/2008   Essential hypertension 02/07/2007   URI 02/07/2007   HEADACHE 02/07/2007    Past Medical History:  Diagnosis Date   Acute pharyngitis 02/26/2009   Qualifier: Diagnosis of  By: Janit Bern     Arthritis    hands and neck    Asthma    Essential hypertension 02/07/2007   Qualifier: Diagnosis of  By: Janit Bern     Headache(784.0)    HTN (hypertension)    Insomnia 12/23/2017   Lower extremity edema 07/31/2017   Migraine 05/23/2017   Pneumonia    SINUSITIS - ACUTE-NOS 03/17/2009   Qualifier: Diagnosis of  By: Janit Bern     Skin cancer    basal cell   SKIN CANCER, HX OF 01/08/2008   Annotation: BSC and SCC Qualifier: Diagnosis of  By: Janit Bern   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 Amy [place]    URI 02/07/2007   Qualifier: Diagnosis of  By: Janit Bern     Past Surgical History:  Procedure Laterality Date   ABDOMINAL HYSTERECTOMY  1995   AUGMENTATION MAMMAPLASTY N/A    Removed 05/25/2021   BREAST ENHANCEMENT SURGERY Bilateral    BREAST IMPLANT REMOVAL Bilateral    with breast lift   CATARACT EXTRACTION W/ INTRAOCULAR LENS IMPLANT Bilateral 03/03/2017   cataract sx with lens implant   COLONOSCOPY  2007   colon--negative   NASAL SEPTUM SURGERY  1983   REDUCTION MAMMAPLASTY     breast lift, 05/25/2021   ROTATOR CUFF REPAIR Right 2000   TUBAL LIGATION  1978   WISDOM TOOTH EXTRACTION     Social  History   Tobacco Use   Smoking status: Former    Packs/day: .3    Types: Cigarettes    Quit date: 03/07/1999    Years since quitting: 24.0   Smokeless tobacco: Never  Vaping Use   Vaping Use: Never used  Substance Use Topics   Alcohol use: No    Alcohol/week: 0.0 standard drinks of alcohol   Drug use: No   Social History   Socioeconomic History   Marital status: Married    Spouse name: Not on file   Number of children: 2   Years of education: Not on file   Highest education level: Not on file  Occupational History   Occupation: Engineer, manufacturing systems: UNEMPLOYED    Comment:  retired  Tobacco Use   Smoking status: Former    Packs/day: .3    Types: Cigarettes    Quit date: 03/07/1999    Years since quitting: 24.0   Smokeless tobacco: Never  Vaping Use   Vaping Use: Never used  Substance and Sexual Activity   Alcohol use: No    Alcohol/week: 0.0 standard drinks of alcohol   Drug use: No   Sexual activity: Never    Partners: Male  Other Topics Concern   Not on file  Social History Narrative   Not on file   Social Determinants of Health   Financial Resource Strain: Low Risk  (01/19/2023)   Overall Financial Resource Strain (CARDIA)    Difficulty of Paying Living Expenses: Not hard at all  Food Insecurity: No Food Insecurity (01/19/2023)   Hunger Vital Sign    Worried About Running Out of Food in the Last Year: Never true    Ran Out of Food in the Last Year: Never true  Transportation Needs: No Transportation Needs (01/19/2023)   PRAPARE - Administrator, Civil Service (Medical): No    Lack of Transportation (Non-Medical): No  Physical Activity: Sufficiently Active (01/19/2023)   Exercise Vital Sign    Days of Exercise per Week: 6 days    Minutes of Exercise per Session: 50 min  Stress: No Stress Concern Present (01/19/2023)   [employer] of Occupational Health - Occupational Stress Questionnaire    Feeling of Stress : Not at all  Social Connections: Socially Integrated (01/19/2023)   Social Connection and Isolation Panel [NHANES]    Frequency of Communication with Friends and Family: More than three times a week    Frequency of Social Gatherings with Friends and Family: More than three times a week    Attends Religious Services: More than 4 times per year    Active Member of [employer] or Organizations: Yes    Attends Banker Meetings: More than 4 times per year    Marital Status: Married  Catering manager Violence: Not At Risk (01/19/2023)   Humiliation, Afraid, Rape, and Kick questionnaire    Fear of Current or Ex-Partner:  No    Emotionally Abused: No    Physically Abused: No    Sexually Abused: No   Family Status  Relation Name Status   Mother  Deceased   Father  Deceased   Sister  Alive   Brother  Deceased   Son  Alive   Daughter  Alive   Neg Hx  (Not Specified)   Family History  Problem Relation Age of Onset   Hypertension Mother    Dementia Mother    Diabetes Father    Hypertension Father  Arthritis Father    Prostate cancer Father    Melanoma Brother    Colon cancer Neg Hx    Colon polyps Neg Hx    Esophageal cancer Neg Hx    Rectal cancer Neg Hx    Stomach cancer Neg Hx    Allergies  Allergen Reactions   Avapro [Irbesartan] Cough   Codeine Other (See Comments)    Makes pt hyper    Metoprolol Cough    Metallic taste , nasal drainage    Norvasc [Amlodipine Besylate] Swelling      Review of Systems  Constitutional:  Negative for fever and malaise/fatigue.  HENT:  Negative for congestion.   Eyes:  Negative for blurred vision.  Respiratory:  Negative for shortness of breath.   Cardiovascular:  Negative for chest pain, palpitations and leg swelling.  Gastrointestinal:  Negative for abdominal pain, blood in stool and nausea.  Genitourinary:  Negative for dysuria and frequency.  Musculoskeletal:  Negative for falls.  Skin:  Negative for rash.  Neurological:  Positive for headaches. Negative for dizziness and loss of consciousness.  Endo/Heme/Allergies:  Negative for environmental allergies.  Psychiatric/Behavioral:  Negative for depression. The patient is not nervous/anxious.       Objective:     BP 128/86 (BP Location: Left Arm, Patient Position: Sitting, Cuff Size: Normal)   Pulse 68   Temp 98 F (36.7 C) (Oral)   Resp 16   Ht 5\' 4"  (1.626 m)   Wt 155 lb 3.2 oz (70.4 kg)   SpO2 97%   BMI 26.64 kg/m  BP Readings from Last 3 Encounters:  04/04/23 128/86  03/08/23 116/74  01/09/23 130/80   Wt Readings from Last 3 Encounters:  04/04/23 155 lb 3.2 oz (70.4 kg)   03/08/23 157 lb 6.4 oz (71.4 kg)  01/19/23 152 lb (68.9 kg)   SpO2 Readings from Last 3 Encounters:  04/04/23 97%  03/08/23 98%  01/09/23 98%      Physical Exam Vitals and nursing note reviewed.  Constitutional:      General: She is not in acute distress.    Appearance: Normal appearance. She is well-developed.  HENT:     Head: Normocephalic and atraumatic.  Eyes:     General: No scleral icterus.       Right eye: No discharge.        Left eye: No discharge.  Cardiovascular:     Rate and Rhythm: Normal rate and regular rhythm.     Heart sounds: No murmur heard. Pulmonary:     Effort: Pulmonary effort is normal. No respiratory distress.     Breath sounds: Normal breath sounds.  Musculoskeletal:        General: Normal range of motion.     Cervical back: Normal range of motion and neck supple.     Right lower leg: No edema.     Left lower leg: No edema.  Skin:    General: Skin is warm and dry.  Neurological:     Mental Status: She is alert and oriented to person, place, and time.  Psychiatric:        Mood and Affect: Mood normal.        Behavior: Behavior normal.        Thought Content: Thought content normal.        Judgment: Judgment normal.      No results found for any visits on 04/04/23.  Last CBC Lab Results  Component Value Date   WBC 8.4  01/09/2023   HGB 13.4 01/09/2023   HCT 40.5 01/09/2023   MCV 95.8 01/09/2023   MCH 31.8 04/08/2022   RDW 13.6 01/09/2023   PLT 285.0 01/09/2023   Last metabolic panel Lab Results  Component Value Date   GLUCOSE 77 01/09/2023   NA 139 01/09/2023   K 3.9 01/09/2023   CL 102 01/09/2023   CO2 25 01/09/2023   BUN 14 01/09/2023   CREATININE 0.95 01/09/2023   GFR 59.10 (L) 01/09/2023   CALCIUM 9.8 01/09/2023   PROT 6.4 01/09/2023   ALBUMIN 4.3 01/09/2023   LABGLOB 2.6 11/06/2019   AGRATIO 1.7 11/06/2019   BILITOT 0.6 01/09/2023   ALKPHOS 72 01/09/2023   AST 18 01/09/2023   ALT 6 01/09/2023   ANIONGAP 9  09/01/2020   Last lipids Lab Results  Component Value Date   CHOL 187 04/08/2022   HDL 63 04/08/2022   LDLCALC 102 (H) 04/08/2022   TRIG 120 04/08/2022   CHOLHDL 3.0 04/08/2022   Last hemoglobin A1c Lab Results  Component Value Date   HGBA1C 5.8 04/30/2021   Last thyroid functions Lab Results  Component Value Date   TSH 1.61 05/12/2022   Last vitamin D Lab Results  Component Value Date   VD25OH 52.94 12/26/2019   Last vitamin B12 and Folate Lab Results  Component Value Date   VITAMINB12 275 12/26/2019      The 10-year ASCVD risk score (Arnett DK, et al., 2019) is: 18.8%    Assessment & Plan:   Problem List Items Addressed This Visit       Unprioritized   Migraine - Primary   Relevant Medications   Erenumab-aooe (AIMOVIG) 70 MG/ML SOAJ  Assessment and Plan    Migraine: Frequent migraines (approximately 2 per week) despite Nurtec use. Nurtec also causing significant reflux. Previous use of Aimovig was beneficial but discontinued due to cost. Discussed potential switch to Qlipta or return to Aimovig. -Discontinue Nurtec due to side effects and suboptimal control of migraines. -Initiate Aimovig 70mg  monthly, as it is now preferred by insurance. -Check with pharmacy regarding insurance coverage and prior authorization requirements. -If Aimovig 70mg  is not effective, consider increasing dose. -Follow-up in 2 months or sooner if migraines persist or worsen.  Gastroesophageal Reflux Disease (GERD): Significant reflux symptoms while on Nurtec, requiring frequent use of Rolaids. -Discontinue Nurtec, which is suspected to be causing the reflux. -Consider evaluation or treatment for GERD if symptoms persist after discontinuation of Nurtec.        Return if symptoms worsen or fail to improve.    [name], DO

## 2023-04-07 ENCOUNTER — Encounter: Payer: Self-pay | Admitting: Family Medicine

## 2023-04-07 ENCOUNTER — Telehealth: Payer: Self-pay

## 2023-04-07 NOTE — Telephone Encounter (Signed)
PA approved.   Your request has been approved Authorization Expiration Date: 07/06/2023

## 2023-04-07 NOTE — Telephone Encounter (Signed)
PA initiated via Covermymeds; KEY: [id number]. Awaiting determination.

## 2023-04-19 ENCOUNTER — Ambulatory Visit: Payer: Medicare HMO | Admitting: Podiatry

## 2023-04-19 DIAGNOSIS — Q828 Other specified congenital malformations of skin: Secondary | ICD-10-CM | POA: Diagnosis not present

## 2023-04-19 NOTE — Progress Notes (Signed)
Subjective:  Patient ID: Laura Hopkins, female    DOB: 02-29-1946,  MRN: [id number]  Chief Complaint  Patient presents with   Callouses    77 y.o. female presents with the above complaint.  Patient presents with left hallux IPJ hyperkeratotic lesion/callus.  Patient has been present for quite some time she has been managing herself.  She wanted discuss treatment options.  Denies any other acute complaints.  Pain scale is 5 out of 10 dull aching nature worsening with walking   Review of Systems: Negative except as noted in the HPI. Denies N/V/F/Ch.  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 Amy [place]    URI 02/07/2007   Qualifier: Diagnosis of  By: Janit Bern      Current Outpatient Medications:    acetaminophen (TYLENOL) 500 MG tablet, Take 500 mg by mouth every 6 (six) hours as needed., Disp: , Rfl:    budesonide-formoterol (SYMBICORT) 80-4.5 MCG/ACT inhaler, Inhale 1 puff into the lungs in the morning and at bedtime., Disp: 10.2 g, Rfl: 12   Erenumab-aooe (AIMOVIG) 70 MG/ML SOAJ, 70mg  sq q month, Disp: 1.12 mL, Rfl: 5   estradiol (VIVELLE-DOT) 0.05 MG/24HR patch, Place onto the skin 2 (two) times a week., Disp: , Rfl:    eszopiclone (LUNESTA) 2 MG TABS tablet, Take 3 mg by mouth daily. Taking 3mg  at bedtime, Disp: , Rfl:    promethazine (PHENERGAN) 25  MG tablet, Take 1 tablet (25 mg total) by mouth as needed., Disp: 30 tablet, Rfl: 1   SUMAtriptan (IMITREX) 100 MG tablet, Take 1 tablet (100 mg total) by mouth as needed for migraine. May repeat in 2 hours if needed, Disp: 9 tablet, Rfl: 4  Social History   Tobacco Use  Smoking Status Former   Current packs/day: 0.00   Types: Cigarettes   Quit date: 03/07/1999   Years since quitting: 24.1  Smokeless Tobacco Never    Allergies  Allergen Reactions   Avapro [Irbesartan] Cough   Codeine Other (See Comments)    Makes pt hyper    Metoprolol Cough    Metallic taste , nasal drainage    Norvasc [Amlodipine Besylate] Swelling   Objective:  There were no vitals filed for this visit. There is no height or weight on file to calculate BMI. Constitutional Well developed. Well nourished.  Vascular Dorsalis pedis pulses palpable bilaterally. Posterior tibial pulses palpable bilaterally. Capillary refill normal to all digits.  No cyanosis or clubbing noted. Pedal hair growth normal.  Neurologic Normal speech. Oriented to person, place, and time. Epicritic sensation to light touch grossly present bilaterally.  Dermatologic Left hallux IPJ hyperkeratotic lesion with central nucleated core.  Pain on palpation.  No pinpoint bleeding noted upon  debridement  Orthopedic: Normal joint ROM without pain or crepitus bilaterally. No visible deformities. No bony tenderness.   Radiographs: None Assessment:   1. Porokeratosis    Plan:  Patient was evaluated and treated and all questions answered.  Left hallux IPJ porokeratosis -All question concerns were discussed with the patient extensive detail using sterile blade and handle the lesion was debrided down to healthy dry tissue.  No complication noted no pinpoint bleeding noted. -We discussed shoe gear modification.  No follow-ups on file.

## 2023-04-25 ENCOUNTER — Telehealth: Payer: Self-pay

## 2023-04-25 NOTE — Telephone Encounter (Signed)
.  LVM for patient to schedule follow up appointment with office for chronic care.  Elijio Miles, [facility] Specialist

## 2023-06-26 ENCOUNTER — Other Ambulatory Visit: Payer: Self-pay | Admitting: Family Medicine

## 2023-06-26 DIAGNOSIS — G43809 Other migraine, not intractable, without status migrainosus: Secondary | ICD-10-CM

## 2023-08-18 ENCOUNTER — Ambulatory Visit (INDEPENDENT_AMBULATORY_CARE_PROVIDER_SITE_OTHER): Payer: Medicare HMO | Admitting: Family Medicine

## 2023-08-18 ENCOUNTER — Encounter: Payer: Self-pay | Admitting: Family Medicine

## 2023-08-18 VITALS — BP 136/88 | HR 83 | Temp 98.1°F | Resp 16 | Ht 64.0 in | Wt 152.2 lb

## 2023-08-18 DIAGNOSIS — F418 Other specified anxiety disorders: Secondary | ICD-10-CM | POA: Insufficient documentation

## 2023-08-18 DIAGNOSIS — G43809 Other migraine, not intractable, without status migrainosus: Secondary | ICD-10-CM | POA: Diagnosis not present

## 2023-08-18 DIAGNOSIS — G4700 Insomnia, unspecified: Secondary | ICD-10-CM

## 2023-08-18 MED ORDER — FLUOXETINE HCL 20 MG PO CAPS
20.0000 mg | ORAL_CAPSULE | Freq: Every day | ORAL | 3 refills | Status: DC
Start: 2023-08-18 — End: 2024-02-29

## 2023-08-18 MED ORDER — QUVIVIQ 25 MG PO TABS
1.0000 | ORAL_TABLET | Freq: Every evening | ORAL | 1 refills | Status: DC | PRN
Start: 2023-08-18 — End: 2024-02-29

## 2023-08-18 MED ORDER — SUMATRIPTAN SUCCINATE 100 MG PO TABS
100.0000 mg | ORAL_TABLET | ORAL | 4 refills | Status: DC | PRN
Start: 2023-08-18 — End: 2024-02-15

## 2023-08-18 NOTE — Patient Instructions (Signed)

## 2023-08-18 NOTE — Progress Notes (Signed)
Established Patient Office Visit  Subjective   Patient ID: Laura Hopkins, female    DOB: 1946-02-17  Age: 77 y.o. MRN: [id number]  Chief Complaint  Patient presents with   Anxiety    HPI Discussed the use of AI scribe software for clinical note transcription with the patient, who gave verbal consent to proceed.  History of Present Illness   The patient, with a history of migraines, presents with a four-month history of progressive depressive symptoms. She reports feeling unable to cope, with a lack of motivation to get out of bed on many days. The patient's dogs provide some motivation for activity, but she has been isolating herself and avoiding social contact. She denies any thoughts of self-harm but expresses a need for assistance in managing her emotional state.  The patient's symptoms include a lack of interest in personal appearance and socializing, which she used to enjoy. She also reports feelings of loneliness, possibly exacerbated by changes in family dynamics as children and grandchildren have grown and moved away. The patient attributes some of her emotional distress to relationship stress with her husband, who recently retired and underwent open heart surgery.  The patient has a history of insomnia, currently managed with Lunesta, but reports that it is no longer effective. She typically achieves only four hours of sleep per night, even with the medication. She has tried other sleep aids in the past, including clozapine and Belsomra, without success.  The patient has previously tried Effexor for her migraines, but discontinued it due to feeling like a "zombie." She expresses concern about potential weight gain with antidepressant therapy but is open to trying Prozac based on her own research.      Patient Active Problem List   Diagnosis Date Noted   Depression with anxiety 08/18/2023   Cervical radiculopathy 01/09/2023   Seasonal allergies 07/12/2021   Preoperative  examination 04/30/2021   External hemorrhoid 09/15/2020   Acute vaginitis 09/15/2020   Colitis 09/15/2020   Depression, major, single episode, mild (HCC) 03/23/2020   History of chronic cough 03/23/2020   OSA on CPAP 03/23/2020   Sleep related choking sensation 01/26/2020   Hyperlipidemia 12/26/2019   Diffuse wheezing 12/24/2019   Snoring 12/24/2019   Laryngopharyngeal reflux disease 12/24/2019   Coughing 12/24/2019   Bronchitis 11/06/2019   SOB (shortness of breath) 03/11/2019   Insomnia 12/23/2017   Lower extremity edema 07/31/2017   Migraine 05/23/2017   SINUSITIS - ACUTE-NOS 03/17/2009   ACUTE PHARYNGITIS 02/26/2009   SKIN CANCER, HX OF 01/08/2008   Essential hypertension 02/07/2007   URI 02/07/2007   HEADACHE 02/07/2007   Past Medical History:  Diagnosis Date   Acute pharyngitis 02/26/2009   Qualifier: Diagnosis of  By: Janit Bern     Arthritis    hands and neck    Asthma    Essential hypertension 02/07/2007   Qualifier: Diagnosis of  By: Janit Bern     Headache(784.0)    HTN (hypertension)    Insomnia 12/23/2017   Lower extremity edema 07/31/2017   Migraine 05/23/2017   Pneumonia    SINUSITIS - ACUTE-NOS 03/17/2009   Qualifier: Diagnosis of  By: Janit Bern     Skin cancer    basal cell   SKIN CANCER, HX OF 01/08/2008   Annotation: BSC and SCC Qualifier: Diagnosis of  By: Janit Bern   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 Amy [place]    URI 02/07/2007   Qualifier:  Diagnosis of  By: Janit Bern     Past Surgical History:  Procedure Laterality Date   ABDOMINAL HYSTERECTOMY  1995   AUGMENTATION MAMMAPLASTY N/A    Removed 05/25/2021   BREAST ENHANCEMENT SURGERY Bilateral    BREAST IMPLANT REMOVAL Bilateral    with breast lift   CATARACT EXTRACTION W/ INTRAOCULAR LENS IMPLANT Bilateral 03/03/2017   cataract sx with lens implant   COLONOSCOPY  2007   colon--negative   NASAL SEPTUM SURGERY  1983    REDUCTION MAMMAPLASTY     breast lift, 05/25/2021   ROTATOR CUFF REPAIR Right 2000   TUBAL LIGATION  1978   WISDOM TOOTH EXTRACTION     Social History   Tobacco Use   Smoking status: Former    Current packs/day: 0.00    Types: Cigarettes    Quit date: 03/07/1999    Years since quitting: 24.4   Smokeless tobacco: Never  Vaping Use   Vaping status: Never Used  Substance Use Topics   Alcohol use: No    Alcohol/week: 0.0 standard drinks of alcohol   Drug use: No   Social History   Socioeconomic History   Marital status: Married    Spouse name: Not on file   Number of children: 2   Years of education: Not on file   Highest education level: Not on file  Occupational History   Occupation: Engineer, manufacturing systems: UNEMPLOYED    Comment: retired  Tobacco Use   Smoking status: Former    Current packs/day: 0.00    Types: Cigarettes    Quit date: 03/07/1999    Years since quitting: 24.4   Smokeless tobacco: Never  Vaping Use   Vaping status: Never Used  Substance and Sexual Activity   Alcohol use: No    Alcohol/week: 0.0 standard drinks of alcohol   Drug use: No   Sexual activity: Never    Partners: Male  Other Topics Concern   Not on file  Social History Narrative   Not on file   Social Determinants of Health   Financial Resource Strain: Low Risk  (01/19/2023)   Overall Financial Resource Strain (CARDIA)    Difficulty of Paying Living Expenses: Not hard at all  Food Insecurity: No Food Insecurity (01/19/2023)   Hunger Vital Sign    Worried About Running Out of Food in the Last Year: Never true    Ran Out of Food in the Last Year: Never true  Transportation Needs: No Transportation Needs (01/19/2023)   PRAPARE - Administrator, Civil Service (Medical): No    Lack of Transportation (Non-Medical): No  Physical Activity: Sufficiently Active (01/19/2023)   Exercise Vital Sign    Days of Exercise per Week: 6 days    Minutes of Exercise per Session: 50 min   Stress: No Stress Concern Present (01/19/2023)   [employer] of Occupational Health - Occupational Stress Questionnaire    Feeling of Stress : Not at all  Social Connections: Socially Integrated (01/19/2023)   Social Connection and Isolation Panel [NHANES]    Frequency of Communication with Friends and Family: More than three times a week    Frequency of Social Gatherings with Friends and Family: More than three times a week    Attends Religious Services: More than 4 times per year    Active Member of [employer] or Organizations: Yes    Attends Banker Meetings: More than 4 times per year    Marital Status:  Married  Intimate Partner Violence: Not At Risk (01/19/2023)   Humiliation, Afraid, Rape, and Kick questionnaire    Fear of Current or Ex-Partner: No    Emotionally Abused: No    Physically Abused: No    Sexually Abused: No   Family Status  Relation Name Status   Mother  Deceased   Father  Deceased   Sister  Alive   Brother  Deceased   Son  Alive   Daughter  Alive   Neg Hx  (Not Specified)  No partnership data on file   Family History  Problem Relation Age of Onset   Hypertension Mother    Dementia Mother    Diabetes Father    Hypertension Father    Arthritis Father    Prostate cancer Father    Melanoma Brother    Colon cancer Neg Hx    Colon polyps Neg Hx    Esophageal cancer Neg Hx    Rectal cancer Neg Hx    Stomach cancer Neg Hx    Allergies  Allergen Reactions   Avapro [Irbesartan] Cough   Codeine Other (See Comments)    Makes pt hyper    Metoprolol Cough    Metallic taste , nasal drainage    Norvasc [Amlodipine Besylate] Swelling      Review of Systems  Constitutional:  Negative for chills, fever and malaise/fatigue.  HENT:  Negative for congestion and hearing loss.   Eyes:  Negative for blurred vision and discharge.  Respiratory:  Negative for cough, sputum production and shortness of breath.   Cardiovascular:  Negative for chest pain,  palpitations and leg swelling.  Gastrointestinal:  Negative for abdominal pain, blood in stool, constipation, diarrhea, heartburn, nausea and vomiting.  Genitourinary:  Negative for dysuria, frequency, hematuria and urgency.  Musculoskeletal:  Negative for back pain, falls and myalgias.  Skin:  Negative for rash.  Neurological:  Negative for dizziness, sensory change, loss of consciousness, weakness and headaches.  Endo/Heme/Allergies:  Negative for environmental allergies. Does not bruise/bleed easily.  Psychiatric/Behavioral:  Negative for depression and suicidal ideas. The patient is not nervous/anxious and does not have insomnia.       Objective:     BP 136/88 (BP Location: Left Arm, Patient Position: Sitting, Cuff Size: Normal)   Pulse 83   Temp 98.1 F (36.7 C) (Oral)   Resp 16   Ht 5\' 4"  (1.626 m)   Wt 152 lb 3.2 oz (69 kg)   SpO2 96%   BMI 26.13 kg/m  BP Readings from Last 3 Encounters:  08/18/23 136/88  04/04/23 128/86  03/08/23 116/74   Wt Readings from Last 3 Encounters:  08/18/23 152 lb 3.2 oz (69 kg)  04/04/23 155 lb 3.2 oz (70.4 kg)  03/08/23 157 lb 6.4 oz (71.4 kg)   SpO2 Readings from Last 3 Encounters:  08/18/23 96%  04/04/23 97%  03/08/23 98%      Physical Exam Vitals and nursing note reviewed.  Constitutional:      General: She is not in acute distress.    Appearance: Normal appearance. She is well-developed.  HENT:     Head: Normocephalic and atraumatic.     Right Ear: Tympanic membrane, ear canal and external ear normal. There is no impacted cerumen.     Left Ear: Tympanic membrane, ear canal and external ear normal. There is no impacted cerumen.     Nose: Nose normal.     Mouth/Throat:     Mouth: Mucous membranes are moist.  Pharynx: Oropharynx is clear. No oropharyngeal exudate or posterior oropharyngeal erythema.  Eyes:     General: No scleral icterus.       Right eye: No discharge.        Left eye: No discharge.      Conjunctiva/sclera: Conjunctivae normal.     Pupils: Pupils are equal, round, and reactive to light.  Neck:     Thyroid: No thyromegaly or thyroid tenderness.     Vascular: No JVD.  Cardiovascular:     Rate and Rhythm: Normal rate and regular rhythm.     Heart sounds: Normal heart sounds. No murmur heard. Pulmonary:     Effort: Pulmonary effort is normal. No respiratory distress.     Breath sounds: Normal breath sounds.  Abdominal:     General: Bowel sounds are normal. There is no distension.     Palpations: Abdomen is soft. There is no mass.     Tenderness: There is no abdominal tenderness. There is no guarding or rebound.  Genitourinary:    Vagina: Normal.  Musculoskeletal:        General: Normal range of motion.     Cervical back: Normal range of motion and neck supple.     Right lower leg: No edema.     Left lower leg: No edema.  Lymphadenopathy:     Cervical: No cervical adenopathy.  Skin:    General: Skin is warm and dry.     Findings: No erythema or rash.  Neurological:     Mental Status: She is alert and oriented to person, place, and time.     Cranial Nerves: No cranial nerve deficit.     Deep Tendon Reflexes: Reflexes are normal and symmetric.  Psychiatric:        Mood and Affect: Mood is anxious and depressed.        Speech: Speech normal.        Behavior: Behavior normal.        Thought Content: Thought content normal.        Judgment: Judgment normal.      No results found for any visits on 08/18/23.  Last CBC Lab Results  Component Value Date   WBC 8.4 01/09/2023   HGB 13.4 01/09/2023   HCT 40.5 01/09/2023   MCV 95.8 01/09/2023   MCH 31.8 04/08/2022   RDW 13.6 01/09/2023   PLT 285.0 01/09/2023   Last metabolic panel Lab Results  Component Value Date   GLUCOSE 77 01/09/2023   NA 139 01/09/2023   K 3.9 01/09/2023   CL 102 01/09/2023   CO2 25 01/09/2023   BUN 14 01/09/2023   CREATININE 0.95 01/09/2023   GFR 59.10 (L) 01/09/2023   CALCIUM 9.8  01/09/2023   PROT 6.4 01/09/2023   ALBUMIN 4.3 01/09/2023   LABGLOB 2.6 11/06/2019   AGRATIO 1.7 11/06/2019   BILITOT 0.6 01/09/2023   ALKPHOS 72 01/09/2023   AST 18 01/09/2023   ALT 6 01/09/2023   ANIONGAP 9 09/01/2020   Last lipids Lab Results  Component Value Date   CHOL 187 04/08/2022   HDL 63 04/08/2022   LDLCALC 102 (H) 04/08/2022   TRIG 120 04/08/2022   CHOLHDL 3.0 04/08/2022   Last hemoglobin A1c Lab Results  Component Value Date   HGBA1C 5.8 04/30/2021   Last thyroid functions Lab Results  Component Value Date   TSH 1.61 05/12/2022   Last vitamin D Lab Results  Component Value Date   VD25OH 52.94 12/26/2019  Last vitamin B12 and Folate Lab Results  Component Value Date   VITAMINB12 275 12/26/2019      The 10-year ASCVD risk score (Arnett DK, et al., 2019) is: 21%    Assessment & Plan:   Problem List Items Addressed This Visit       Unprioritized   Insomnia   Relevant Medications   Daridorexant HCl (QUVIVIQ) 25 MG TABS   Migraine   Relevant Medications   SUMAtriptan (IMITREX) 100 MG tablet   FLUoxetine (PROZAC) 20 MG capsule   Depression with anxiety - Primary   Relevant Medications   FLUoxetine (PROZAC) 20 MG capsule  Assessment and Plan    Major Depressive Disorder   She reports a 74-month history of worsening depressive symptoms, including lack of motivation, social isolation, and anhedonia but denies suicidal ideation. Due to significant side effects, Effexor was poorly tolerated. We discussed the potential benefits of Prozac, especially for menopausal symptoms and its non-addictive nature, although she expressed concerns about weight gain and sleep disturbances. We will start Prozac 20 mg daily and follow up in one month to assess the response and adjust the dosage if necessary.  Insomnia   She has chronic insomnia with a poor response to Lunesta 3 mg, achieving no more than four hours of sleep per night. Previous trials of clozapine and  Belsomra were ineffective. We discussed QVIG and its potential benefits over Belsomra, including insurance coverage and cost. We will start QVIG 25 mg nightly, and if there is no improvement after one week, increase to 50 mg nightly. She should notify us if the higher dose is required for adjustment in the prescription.  Migraine   Her migraines are managed with sumatriptan, with no new symptoms or changes in the migraine pattern reported. We will refill the sumatriptan prescription.  Follow-up   We will follow up in one month, either in-person or virtually, to assess the effectiveness of Prozac and QVIG and monitor for any side effects or adverse reactions to new medications.        Return in about 4 weeks (around 09/15/2023), or if symptoms worsen or fail to improve, for depression / anxiety.    [name], DO

## 2023-08-23 ENCOUNTER — Ambulatory Visit: Payer: Medicare HMO | Admitting: Pulmonary Disease

## 2023-08-23 ENCOUNTER — Encounter: Payer: Self-pay | Admitting: Pulmonary Disease

## 2023-08-23 VITALS — BP 140/94 | HR 72 | Temp 98.6°F | Ht 64.0 in | Wt 152.6 lb

## 2023-08-23 DIAGNOSIS — F320 Major depressive disorder, single episode, mild: Secondary | ICD-10-CM

## 2023-08-23 DIAGNOSIS — J4520 Mild intermittent asthma, uncomplicated: Secondary | ICD-10-CM | POA: Diagnosis not present

## 2023-08-23 DIAGNOSIS — K219 Gastro-esophageal reflux disease without esophagitis: Secondary | ICD-10-CM | POA: Diagnosis not present

## 2023-08-23 NOTE — Progress Notes (Signed)
JOLLEEN JUENEMANN    [id number]    1946-08-23  Primary Care Physician:Lowne Almeta Monas Grayling Congress, DO  Referring Physician: Zola Button, Grayling Congress, DO [address] [place],  [place] [address]  Chief complaint: Follow-up for asthma  HPI: 77 y.o. with history of OSA on CPAP, depression, hypertension, hysterectomy  C/O cough for the past 6 years. Originally thought to be secondary to ARB which was held Cough is non productive in nature with wheezing, congestion in morning. Dyspnea is increased with cold air. Started on singulair around summer 2022 with improvement but developed nausea so stopped.  Now she has return of cough  She has been prescribed Flovent which she uses only intermittently  Has allergic rhinitis and pos nasal drip. H/O deviated septum s/p surgery H/O OSA, stopped CPAP in 2021.   Evaluated by Dr. Lavon Paganini, GI for abdominal discomfort Continues on PPI and pepcid.  EGD on dec 2022 done showing hiatal hernia and normal stomach  Pets: Two dogs Occupation:Retired from postal service Exposures:No mold, hot tub jaccuzi, no down pillows or comforter Smoking history: Never smoker.  Travel history: No travel Relevant family history: No family history of lung disease  Interim history: Discussed the use of AI scribe software for clinical note transcription with the patient, who gave verbal consent to proceed.  The patient, with a history of asthma, reports a significant improvement in her symptoms. She notes that she is using her inhaler, Symbicort, less frequently, sometimes going a week or two without needing it. The primary trigger for inhaler use is nocturnal coughing, which prompts her to use the inhaler to alleviate symptoms. She denies needing the inhaler on a daily basis.   Outpatient Encounter Medications as of 08/23/2023  Medication Sig   acetaminophen (TYLENOL) 500 MG tablet Take 500 mg by mouth every 6 (six) hours as needed.    budesonide-formoterol (SYMBICORT) 80-4.5 MCG/ACT inhaler Inhale 1 puff into the lungs in the morning and at bedtime.   Erenumab-aooe (AIMOVIG) 70 MG/ML SOAJ 70mg  sq q month   estradiol (VIVELLE-DOT) 0.05 MG/24HR patch Place onto the skin 2 (two) times a week.   FLUoxetine (PROZAC) 20 MG capsule Take 1 capsule (20 mg total) by mouth daily.   promethazine (PHENERGAN) 25 MG tablet Take 1 tablet (25 mg total) by mouth as needed.   SUMAtriptan (IMITREX) 100 MG tablet Take 1 tablet (100 mg total) by mouth as needed for migraine. May repeat in 2 hours if needed   Daridorexant HCl (QUVIVIQ) 25 MG TABS Take 1 tablet (25 mg total) by mouth at bedtime as needed. (Patient not taking: Reported on 08/23/2023)   No facility-administered encounter medications on file as of 08/23/2023.   Physical Exam: Blood pressure (!) 140/94, pulse 72, temperature 98.6 F (37 C), temperature source Oral, height 5\' 4"  (1.626 m), weight 152 lb 9.6 oz (69.2 kg), SpO2 96%. Gen:      No acute distress HEENT:  EOMI, sclera anicteric Neck:     No masses; no thyromegaly Lungs:    Clear to auscultation bilaterally; normal respiratory effort CV:         Regular rate and rhythm; no murmurs Abd:      + bowel sounds; soft, non-tender; no palpable masses, no distension Ext:    No edema; adequate peripheral perfusion Skin:      Warm and dry; no rash Neuro: alert and oriented x 3 Psych: normal mood and affect  Data Reviewed: Imaging: Chest x-ray 11/07/2019-no acute cardiopulmonary abnormality I have reviewed the images personally.  PFTs: 09/15/2021 FVC 2.88 (22%), FEV1 2.27 [107%], F/F 79, TLC 5.13 [104%], DLCO 19.72 [95%] Normal test  ACT score 04/10/2022-24  Labs: CBC 04/30/2021-WBC 5.9, eos 5.3%, absolute eosinophil count 313 CBC 08/05/2021-WBC 7.3, eos 2.1%, absolute eosinophil count 153  Assessment:  Asthma Mild, well-controlled with infrequent use of Symbicort. No daily requirement. Nocturnal coughing episodes managed  with Symbicort. -Continue Symbicort as needed. -Primary care physician to manage ongoing care and renew prescriptions. -Return to specialist if Symbicort use increases to daily, especially if shortness of breath persists despite daily use.   GERD Continue on PPI  Plan/Recommendations: Return as needed  Chilton Greathouse MD [facility] 08/23/2023, 1:16 PM  CC: Zola Button, Grayling Congress, *

## 2023-08-23 NOTE — Patient Instructions (Signed)
VISIT SUMMARY:  During today's visit, we discussed your asthma management. You reported a significant improvement in your symptoms and noted that you are using your inhaler, Symbicort, less frequently. Your primary trigger for inhaler use is nocturnal coughing, but you do not need the inhaler on a daily basis.  YOUR PLAN:  -ASTHMA: Asthma is a condition where your airways narrow and swell, producing extra mucus, which can make breathing difficult. Your asthma is currently mild and well-controlled with infrequent use of Symbicort. You should continue using Symbicort as needed, especially for nocturnal coughing episodes. Your primary care physician will manage your ongoing care and renew your prescriptions. If you find that you need to use Symbicort daily or experience persistent shortness of breath, please return to the specialist for further evaluation.  INSTRUCTIONS:  Please follow up with your primary care physician for ongoing asthma management and prescription renewals. Return to the specialist if you need to use Symbicort daily or if you experience persistent shortness of breath.

## 2023-09-18 ENCOUNTER — Ambulatory Visit: Payer: Medicare HMO | Admitting: Family Medicine

## 2023-10-05 ENCOUNTER — Telehealth: Payer: Self-pay

## 2023-10-05 NOTE — Telephone Encounter (Signed)
 PA approved. Effective 10/04/2023 - 10/02/2024

## 2023-10-05 NOTE — Telephone Encounter (Signed)
 PA initiated via Covermymeds; KEY: [id number]. Awaiting determination.

## 2023-11-14 ENCOUNTER — Other Ambulatory Visit: Payer: Self-pay | Admitting: Pulmonary Disease

## 2024-01-05 ENCOUNTER — Other Ambulatory Visit: Payer: Self-pay | Admitting: Family Medicine

## 2024-01-05 DIAGNOSIS — G43809 Other migraine, not intractable, without status migrainosus: Secondary | ICD-10-CM

## 2024-01-29 ENCOUNTER — Encounter

## 2024-02-08 DIAGNOSIS — R110 Nausea: Secondary | ICD-10-CM | POA: Diagnosis not present

## 2024-02-08 DIAGNOSIS — I10 Essential (primary) hypertension: Secondary | ICD-10-CM | POA: Diagnosis not present

## 2024-02-08 DIAGNOSIS — R519 Headache, unspecified: Secondary | ICD-10-CM | POA: Diagnosis not present

## 2024-02-09 ENCOUNTER — Encounter: Payer: Self-pay | Admitting: Family Medicine

## 2024-02-09 ENCOUNTER — Ambulatory Visit (INDEPENDENT_AMBULATORY_CARE_PROVIDER_SITE_OTHER): Admitting: Family Medicine

## 2024-02-09 VITALS — BP 140/90 | HR 72 | Temp 97.9°F | Resp 18 | Ht 64.0 in | Wt 151.0 lb

## 2024-02-09 DIAGNOSIS — R110 Nausea: Secondary | ICD-10-CM

## 2024-02-09 DIAGNOSIS — I10 Essential (primary) hypertension: Secondary | ICD-10-CM | POA: Diagnosis not present

## 2024-02-09 DIAGNOSIS — R519 Headache, unspecified: Secondary | ICD-10-CM | POA: Diagnosis not present

## 2024-02-09 DIAGNOSIS — R1013 Epigastric pain: Secondary | ICD-10-CM | POA: Diagnosis not present

## 2024-02-09 LAB — COMPREHENSIVE METABOLIC PANEL WITH GFR
AG Ratio: 1.9 (calc) (ref 1.0–2.5)
ALT: 14 U/L (ref 6–29)
AST: 22 U/L (ref 10–35)
Albumin: 4.3 g/dL (ref 3.6–5.1)
Alkaline phosphatase (APISO): 83 U/L (ref 37–153)
BUN: 10 mg/dL (ref 7–25)
CO2: 30 mmol/L (ref 20–32)
Calcium: 9.7 mg/dL (ref 8.6–10.4)
Chloride: 102 mmol/L (ref 98–110)
Creat: 0.92 mg/dL (ref 0.60–1.00)
Globulin: 2.3 g/dL (ref 1.9–3.7)
Glucose, Bld: 90 mg/dL (ref 65–99)
Potassium: 4.9 mmol/L (ref 3.5–5.3)
Sodium: 139 mmol/L (ref 135–146)
Total Bilirubin: 0.7 mg/dL (ref 0.2–1.2)
Total Protein: 6.6 g/dL (ref 6.1–8.1)
eGFR: 65 mL/min/{1.73_m2} (ref >=60)

## 2024-02-09 LAB — CBC WITH DIFFERENTIAL/PLATELET
Absolute Lymphocytes: 1939 {cells}/uL (ref 850–3900)
Absolute Monocytes: 559 {cells}/uL (ref 200–950)
Basophils Absolute: 48 {cells}/uL (ref 0–200)
Basophils Relative: 0.7 %
Eosinophils Absolute: 110 {cells}/uL (ref 15–500)
Eosinophils Relative: 1.6 %
HCT: 43.9 % (ref 35.0–45.0)
Hemoglobin: 14.3 g/dL (ref 11.7–15.5)
MCH: 30.5 pg (ref 27.0–33.0)
MCHC: 32.6 g/dL (ref 32.0–36.0)
MCV: 93.6 fL (ref 80.0–100.0)
MPV: 11.3 fL (ref 7.5–12.5)
Monocytes Relative: 8.1 %
Neutro Abs: 4244 {cells}/uL (ref 1500–7800)
Neutrophils Relative %: 61.5 %
Platelets: 278 10*3/uL (ref 140–400)
RBC: 4.69 10*6/uL (ref 3.80–5.10)
RDW: 13.1 % (ref 11.0–15.0)
Total Lymphocyte: 28.1 %
WBC: 6.9 10*3/uL (ref 3.8–10.8)

## 2024-02-09 LAB — TSH: TSH: 1.2 m[IU]/L (ref 0.40–4.50)

## 2024-02-09 LAB — SEDIMENTATION RATE: Sed Rate: 6 mm/h (ref 0–30)

## 2024-02-09 MED ORDER — PANTOPRAZOLE SODIUM 20 MG PO TBEC
20.0000 mg | DELAYED_RELEASE_TABLET | Freq: Every day | ORAL | 1 refills | Status: DC
Start: 2024-02-09 — End: 2024-03-04

## 2024-02-09 MED ORDER — KETOROLAC TROMETHAMINE 60 MG/2ML IM SOLN
60.0000 mg | Freq: Once | INTRAMUSCULAR | Status: AC
Start: 2024-02-09 — End: 2024-02-09
  Administered 2024-02-09: 60 mg via INTRAMUSCULAR

## 2024-02-09 MED ORDER — SPIRONOLACTONE 25 MG PO TABS
25.0000 mg | ORAL_TABLET | Freq: Every day | ORAL | 3 refills | Status: DC
Start: 2024-02-09 — End: 2024-06-06

## 2024-02-09 MED ORDER — ONDANSETRON HCL 4 MG PO TABS: 4.0000 mg | ORAL_TABLET | Freq: Three times a day (TID) | ORAL | 0 refills | Status: AC | PRN

## 2024-02-09 NOTE — Assessment & Plan Note (Signed)
 Toradol  60 mg IM Ha ? Due to HTN Restart spironolactone   F/u 2-3 weeks

## 2024-02-09 NOTE — Progress Notes (Signed)
 )72  Established Patient Office Visit  Subjective   Patient ID: Laura Hopkins, female    DOB: October 22, 1945  Age: 78 y.o. MRN: [id number]  Chief Complaint  Patient presents with   Abdominal Pain    Pt states having abdominal pain and having nausea, no app, and headache. Negative covid yesterday at UC. Blood pressure at UC 180/100.     HPI Discussed the use of AI scribe software for clinical note transcription with the patient, who gave verbal consent to proceed.  History of Present Illness Laura Hopkins is a 78 year old female with a history of migraines and elevated blood pressure who presents with nausea, headache, and elevated blood pressure.  She has been experiencing severe nausea and headache since Sunday, which began while [place] the beach. She visited an urgent care where her blood pressure was noted to be high. She attributes the elevated blood pressure to the use of triptans for her headache, which she had been taking for five days. She has a history of triptans causing increased blood pressure.  The nausea is persistent, with an aversion to food smells, particularly coffee, which exacerbates her nausea. She has not vomited but feels extremely nauseated. She has been eating minimal amounts, primarily Jell-O and oatmeal, and drinking water. The nausea is debilitating, stating 'I was so sick, sick feeling.'  She experienced abdominal cramping and a sensation of impending diarrhea but did not have actual diarrhea. She took Zofran , prescribed by a nurse practitioner, which helped with nausea initially but was concerned it might be causing headaches. She switched to Phenergan , which helped her sleep, and she used an ice pack to alleviate her headache.  She has a history of elevated blood pressure and was previously on various antihypertensive medications, including spironolactone , metoprolol , and olmesartan . She recalls experiencing side effects such as a metallic taste, dry  cough, and ankle swelling with different medications. She has not been on blood pressure medication for several years.  She reports a history of indigestion, for which she occasionally uses Tums. She denies current indigestion but notes she has not been eating much. She mentions using bourbon in the past for indigestion but not recently.  She is currently taking Zofran , prescribed to be taken three times a day, and has some Phenergan  at home. She has not taken triptans since Thursday morning. She is also due to take her next dose of amiodarone for migraines.   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0)    HTN (hypertension)    Insomnia 12/23/2017   Lower extremity edema 07/31/2017   Migraine 05/23/2017   Pneumonia    SINUSITIS - ACUTE-NOS 03/17/2009    Qualifier: Diagnosis of  By: Tracy Friedlander     Skin cancer    basal cell   SKIN CANCER, HX OF 01/08/2008   Annotation: BSC and SCC Qualifier: Diagnosis of  By: Tracy Friedlander   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 Amy [place]    URI 02/07/2007   Qualifier: Diagnosis of  By: Tracy Friedlander     Past Surgical History:  Procedure Laterality Date   ABDOMINAL HYSTERECTOMY  1995   AUGMENTATION MAMMAPLASTY N/A    Removed 05/25/2021   BREAST ENHANCEMENT SURGERY Bilateral    BREAST IMPLANT REMOVAL Bilateral    with breast lift   CATARACT EXTRACTION W/ INTRAOCULAR LENS IMPLANT Bilateral 03/03/2017   cataract sx with lens implant   COLONOSCOPY  2007   colon--negative   NASAL SEPTUM SURGERY  1983   REDUCTION MAMMAPLASTY     breast lift, 05/25/2021   ROTATOR CUFF REPAIR Right 2000   TUBAL LIGATION  1978   WISDOM TOOTH EXTRACTION     Social History   Tobacco Use   Smoking status: Former    Current packs/day: 0.00    Types: Cigarettes    Quit date: 03/07/1999    Years since quitting: 24.9   Smokeless tobacco: Never  Vaping Use   Vaping status: Never Used  Substance Use Topics   Alcohol use: No    Alcohol/week: 0.0 standard drinks of alcohol   Drug use: No   Social History   Socioeconomic History   Marital status: Married    Spouse name: Not on file   Number of children: 2   Years of education: Not on file   Highest education level: Not on file  Occupational History   Occupation: Engineer, manufacturing systems: UNEMPLOYED    Comment: retired  Tobacco Use   Smoking status: Former    Current packs/day: 0.00    Types: Cigarettes    Quit date: 03/07/1999    Years since quitting: 24.9   Smokeless tobacco: Never  Vaping Use   Vaping status: Never Used  Substance and Sexual Activity   Alcohol use: No    Alcohol/week: 0.0 standard drinks of alcohol   Drug use: No   Sexual activity: Never    Partners: Male  Other Topics Concern   Not on file   Social History Narrative   Not on file   Social Drivers of Health   Financial Resource Strain: Low Risk  (01/19/2023)   Overall Financial Resource Strain (CARDIA)    Difficulty of Paying Living Expenses: Not hard at all  Food Insecurity: No Food Insecurity (01/19/2023)   Hunger Vital Sign    Worried About Running Out of Food in the Last Year: Never true    Ran Out of Food in the Last Year: Never true  Transportation Needs: No Transportation Needs (01/19/2023)   PRAPARE - Administrator, Civil Service (Medical): No    Lack of Transportation (Non-Medical): No  Physical Activity: Sufficiently Active (01/19/2023)   Exercise Vital Sign    Days of Exercise per Week: 6 days  Minutes of Exercise per Session: 50 min  Stress: No Stress Concern Present (01/19/2023)   [employer] of Occupational Health - Occupational Stress Questionnaire    Feeling of Stress : Not at all  Social Connections: Socially Integrated (01/19/2023)   Social Connection and Isolation Panel [NHANES]    Frequency of Communication with Friends and Family: More than three times a week    Frequency of Social Gatherings with Friends and Family: More than three times a week    Attends Religious Services: More than 4 times per year    Active Member of [employer] or Organizations: Yes    Attends Engineer, structural: More than 4 times per year    Marital Status: Married  Catering manager Violence: Not At Risk (01/19/2023)   Humiliation, Afraid, Rape, and Kick questionnaire    Fear of Current or Ex-Partner: No    Emotionally Abused: No    Physically Abused: No    Sexually Abused: No   Family Status  Relation Name Status   Mother  Deceased   Father  Deceased   Sister  Alive   Brother  Deceased   Son  Alive   Daughter  Alive   Neg Hx  (Not Specified)  No partnership data on file   Family History  Problem Relation Age of Onset   Hypertension Mother    Dementia Mother    Diabetes Father     Hypertension Father    Arthritis Father    Prostate cancer Father    Melanoma Brother    Colon cancer Neg Hx    Colon polyps Neg Hx    Esophageal cancer Neg Hx    Rectal cancer Neg Hx    Stomach cancer Neg Hx    Allergies  Allergen Reactions   Avapro  [Irbesartan ] Cough   Codeine Other (See Comments)    Makes pt hyper    Metoprolol  Cough    Metallic taste , nasal drainage    Norvasc  [Amlodipine  Besylate] Swelling      Review of Systems  Constitutional:  Negative for chills, fever and malaise/fatigue.  HENT:  Negative for congestion and hearing loss.   Eyes:  Negative for blurred vision and discharge.  Respiratory:  Negative for cough, sputum production and shortness of breath.   Cardiovascular:  Negative for chest pain, palpitations and leg swelling.  Gastrointestinal:  Negative for abdominal pain, blood in stool, constipation, diarrhea, heartburn, nausea and vomiting.  Genitourinary:  Negative for dysuria, frequency, hematuria and urgency.  Musculoskeletal:  Negative for back pain, falls and myalgias.  Skin:  Negative for rash.  Neurological:  Negative for dizziness, sensory change, loss of consciousness, weakness and headaches.  Endo/Heme/Allergies:  Negative for environmental allergies. Does not bruise/bleed easily.  Psychiatric/Behavioral:  Negative for depression and suicidal ideas. The patient is not nervous/anxious and does not have insomnia.       Objective:     BP (!) 140/90 (BP Location: Right Arm, Patient Position: Sitting, Cuff Size: Normal)   Pulse 72   Temp 97.9 F (36.6 C) (Oral)   Resp 18   Ht 5\' 4"  (1.626 m)   Wt 151 lb (68.5 kg)   SpO2 97%   BMI 25.92 kg/m  BP Readings from Last 3 Encounters:  02/09/24 (!) 140/90  08/23/23 (!) 140/94  08/18/23 136/88   Wt Readings from Last 3 Encounters:  02/09/24 151 lb (68.5 kg)  08/23/23 152 lb 9.6 oz (69.2 kg)  08/18/23 152 lb 3.2 oz (69 kg)  SpO2 Readings from Last 3 Encounters:  02/09/24 97%   08/23/23 96%  08/18/23 96%      Physical Exam Vitals and nursing note reviewed.  Constitutional:      General: She is not in acute distress.    Appearance: Normal appearance. She is well-developed.  HENT:     Head: Normocephalic and atraumatic.  Eyes:     General: No scleral icterus.       Right eye: No discharge.        Left eye: No discharge.  Cardiovascular:     Rate and Rhythm: Normal rate and regular rhythm.     Heart sounds: No murmur heard. Pulmonary:     Effort: Pulmonary effort is normal. No respiratory distress.     Breath sounds: Normal breath sounds.  Musculoskeletal:        General: Normal range of motion.     Cervical back: Normal range of motion and neck supple.     Right lower leg: No edema.     Left lower leg: No edema.  Skin:    General: Skin is warm and dry.  Neurological:     Mental Status: She is alert and oriented to person, place, and time.  Psychiatric:        Mood and Affect: Mood normal.        Behavior: Behavior normal.        Thought Content: Thought content normal.        Judgment: Judgment normal.      No results found for any visits on 02/09/24.  Last CBC Lab Results  Component Value Date   WBC 8.4 01/09/2023   HGB 13.4 01/09/2023   HCT 40.5 01/09/2023   MCV 95.8 01/09/2023   MCH 31.8 04/08/2022   RDW 13.6 01/09/2023   PLT 285.0 01/09/2023   Last metabolic panel Lab Results  Component Value Date   GLUCOSE 77 01/09/2023   NA 139 01/09/2023   K 3.9 01/09/2023   CL 102 01/09/2023   CO2 25 01/09/2023   BUN 14 01/09/2023   CREATININE 0.95 01/09/2023   GFR 59.10 (L) 01/09/2023   CALCIUM 9.8 01/09/2023   PROT 6.4 01/09/2023   ALBUMIN 4.3 01/09/2023   LABGLOB 2.6 11/06/2019   AGRATIO 1.7 11/06/2019   BILITOT 0.6 01/09/2023   ALKPHOS 72 01/09/2023   AST 18 01/09/2023   ALT 6 01/09/2023   ANIONGAP 9 09/01/2020   Last lipids Lab Results  Component Value Date   CHOL 187 04/08/2022   HDL 63 04/08/2022   LDLCALC 102  (H) 04/08/2022   TRIG 120 04/08/2022   CHOLHDL 3.0 04/08/2022   Last hemoglobin A1c Lab Results  Component Value Date   HGBA1C 5.8 04/30/2021   Last thyroid  functions Lab Results  Component Value Date   TSH 1.61 05/12/2022   Last vitamin D  Lab Results  Component Value Date   VD25OH 52.94 12/26/2019   Last vitamin B12 and Folate Lab Results  Component Value Date   VITAMINB12 275 12/26/2019      The 10-year ASCVD risk score (Arnett DK, et al., 2019) is: 24.6%    Assessment & Plan:   Problem List Items Addressed This Visit       Unprioritized   Headache - Primary   Toradol  60 mg IM Ha ? Due to HTN Restart spironolactone   F/u 2-3 weeks       Relevant Orders   CBC with Differential/Platelet   Comprehensive metabolic panel with GFR  Sedimentation rate   TSH   Essential hypertension   Poorly controlled will alter medications, encouraged DASH diet, minimize caffeine and obtain adequate sleep. Report concerning symptoms and follow up as directed and as needed       Relevant Medications   spironolactone  (ALDACTONE ) 25 MG tablet   Other Visit Diagnoses       Primary hypertension       Relevant Medications   spironolactone  (ALDACTONE ) 25 MG tablet     Dyspepsia       Relevant Medications   pantoprazole  (PROTONIX ) 20 MG tablet     Nausea       Relevant Medications   ondansetron  (ZOFRAN ) 4 MG tablet     Assessment and Plan Assessment & Plan Nausea and headache   Acute onset of nausea and headache since Sunday, with nausea worsened by food smells and recurring headache. Migraine treatment with triptans may have elevated blood pressure. Nausea partially relieved by Zofran , but headache concerns persist as a side effect. Phenergan  causes sedation, so Zofran  is preferred for nausea due to fewer side effects. A Toradol  injection is planned for headache relief. Emergency care is advised if symptoms worsen or do not improve. Administer Toradol  injection for headache  relief. Prescribe Zofran  tablets for nausea, to be taken 3-4 times daily as needed. Encourage hydration with fluids like Gatorade, Pedialyte, and water. Advise avoiding triptans if possible and limit use to no more than two per day. Consider neurology referral if headaches persist. Perform blood work to assess overall health and test for COVID-19 to rule out infection. Advise emergency room visit if nausea or headache worsens or if unable to control symptoms with prescribed medications.  Hypertension   Blood pressure is elevated at urgent care and today's visit. Previous medication trials included valsartan , irbesartan , olmesartan , chlorthalidone , spironolactone , metoprolol , carvedilol , and amlodipine , with side effects like metallic taste, dry cough, and ankle swelling. Decision to restart spironolactone  as it is a diuretic that helps with blood pressure and was previously tolerated. Prescribe spironolactone  once daily and monitor blood pressure regularly.    Return in about 3 weeks (around 03/01/2024), or if symptoms worsen or fail to improve, for hypertension.    Maxwel Meadowcroft R Lowne Chase, DO

## 2024-02-09 NOTE — Assessment & Plan Note (Signed)
 Poorly controlled will alter medications, encouraged DASH diet, minimize caffeine and obtain adequate sleep. Report concerning symptoms and follow up as directed and as needed

## 2024-02-09 NOTE — Patient Instructions (Signed)

## 2024-02-15 ENCOUNTER — Other Ambulatory Visit: Payer: Self-pay | Admitting: Family Medicine

## 2024-02-15 DIAGNOSIS — G43809 Other migraine, not intractable, without status migrainosus: Secondary | ICD-10-CM

## 2024-02-16 ENCOUNTER — Ambulatory Visit: Payer: Self-pay | Admitting: Family Medicine

## 2024-02-21 ENCOUNTER — Ambulatory Visit (INDEPENDENT_AMBULATORY_CARE_PROVIDER_SITE_OTHER): Admitting: *Deleted

## 2024-02-21 VITALS — Ht 64.0 in | Wt 151.0 lb

## 2024-02-21 DIAGNOSIS — Z0000 Encounter for general adult medical examination without abnormal findings: Secondary | ICD-10-CM

## 2024-02-21 NOTE — Progress Notes (Signed)
 Please attest this visit in the absence of patient primary care provider.    Subjective:   Laura Hopkins is a 78 y.o. who presents for a Medicare Wellness preventive visit.  As a reminder, Annual Wellness Visits don't include a physical exam, and some assessments may be limited, especially if this visit is performed virtually. We may recommend an in-person follow-up visit with your provider if needed.  Visit Complete: Virtual I connected with  Kai Orange on 02/21/24 by a audio enabled telemedicine application and verified that I am speaking with the correct person using two identifiers.  Patient Location: Home  Provider Location: Office/Clinic  I discussed the limitations of evaluation and management by telemedicine. The patient expressed understanding and agreed to proceed.  Vital Signs: Because this visit was a virtual/telehealth visit, some criteria may be missing or patient reported. Any vitals not documented were not able to be obtained and vitals that have been documented are patient reported.  VideoDeclined- This patient declined Librarian, academic. Therefore the visit was completed with audio only.  Persons Participating in Visit: Patient.  AWV Questionnaire: No: Patient Medicare AWV questionnaire was not completed prior to this visit.  Cardiac Risk Factors include: advanced age (>40men, >32 women);hypertension     Objective:     Today's Vitals   02/21/24 1411  Weight: 151 lb (68.5 kg)  Height: 5\' 4"  (1.626 m)   Body mass index is 25.92 kg/m.     01/19/2023    3:42 PM 11/15/2021    1:05 PM 09/01/2020   12:28 PM 08/14/2017    9:24 AM 08/08/2016    8:38 AM 04/12/2016   12:53 PM 03/23/2016   10:36 AM  Advanced Directives  Does Patient Have a Medical Advance Directive? Yes Yes Yes Yes Yes Yes Yes  Type of Estate agent of [place];Living will Healthcare Power of [employer] of [place];Living will  Healthcare Power of [place];Living will Healthcare Power of [place];Living will Living will;Healthcare Power of Attorney Living will;Healthcare Power of Attorney  Does patient want to make changes to medical advance directive? No - Patient declined        Copy of Healthcare Power of Attorney in Chart? Yes - validated most recent copy scanned in chart (See row information) Yes - validated most recent copy scanned in chart (See row information)  Yes No - copy requested No - copy requested   Would patient like information on creating a medical advance directive?   No - Patient declined        Current Medications (verified) Outpatient Encounter Medications as of 02/21/2024  Medication Sig   acetaminophen  (TYLENOL ) 500 MG tablet Take 500 mg by mouth every 6 (six) hours as needed.   Daridorexant  HCl (QUVIVIQ ) 25 MG TABS Take 1 tablet (25 mg total) by mouth at bedtime as needed.   Erenumab -aooe (AIMOVIG ) 70 MG/ML SOAJ Inject 70 mg into the skin every 30 (thirty) days.   estradiol (VIVELLE-DOT) 0.05 MG/24HR patch Place onto the skin 2 (two) times a week.   FLUoxetine  (PROZAC ) 20 MG capsule Take 1 capsule (20 mg total) by mouth daily.   ondansetron  (ZOFRAN ) 4 MG tablet Take 1 tablet (4 mg total) by mouth every 8 (eight) hours as needed for nausea or vomiting.   pantoprazole  (PROTONIX ) 20 MG tablet Take 1 tablet (20 mg total) by mouth daily.   promethazine  (PHENERGAN ) 25 MG tablet Take 1 tablet (25 mg total) by mouth as needed.  spironolactone  (ALDACTONE ) 25 MG tablet Take 1 tablet (25 mg total) by mouth daily.   SUMAtriptan  (IMITREX ) 100 MG tablet TAKE 1 TABLET (100 MG TOTAL) BY MOUTH AS NEEDED FOR MIGRAINE. MAY REPEAT IN 2 HOURS IF NEEDED   SYMBICORT  80-4.5 MCG/ACT inhaler INHALE 1 PUFF INTO THE LUNGS IN THE MORNING AND AT BEDTIME.   No facility-administered encounter medications on file as of 02/21/2024.    Allergies (verified) Avapro  [irbesartan ], Codeine, Metoprolol , and Norvasc  [amlodipine   besylate]   History: Past Medical History:  Diagnosis Date   Acute pharyngitis 02/26/2009   Qualifier: Diagnosis of  By: Tracy Friedlander     Arthritis    hands and neck    Asthma    Essential hypertension 02/07/2007   Qualifier: Diagnosis of  By: Gareth Junes)    HTN (hypertension)    Insomnia 12/23/2017   Lower extremity edema 07/31/2017   Migraine 05/23/2017   Pneumonia    SINUSITIS - ACUTE-NOS 03/17/2009   Qualifier: Diagnosis of  By: Tracy Friedlander     Skin cancer    basal cell   SKIN CANCER, HX OF 01/08/2008   Annotation: BSC and SCC Qualifier: Diagnosis of  By: Tracy Friedlander   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 Amy Swaziland    URI 02/07/2007   Qualifier: Diagnosis of  By: Tracy Friedlander     Past Surgical History:  Procedure Laterality Date   ABDOMINAL HYSTERECTOMY  1995   AUGMENTATION MAMMAPLASTY N/A    Removed 05/25/2021   BREAST ENHANCEMENT SURGERY Bilateral    BREAST IMPLANT REMOVAL Bilateral    with breast lift   CATARACT EXTRACTION W/ INTRAOCULAR LENS IMPLANT Bilateral 03/03/2017   cataract sx with lens implant   COLONOSCOPY  2007   colon--negative   NASAL SEPTUM SURGERY  1983   REDUCTION MAMMAPLASTY     breast lift, 05/25/2021   ROTATOR CUFF REPAIR Right 2000   TUBAL LIGATION  1978   WISDOM TOOTH EXTRACTION     Family History  Problem Relation Age of Onset   Hypertension Mother    Dementia Mother    Diabetes Father    Hypertension Father    Arthritis Father    Prostate cancer Father    Melanoma Brother    Colon cancer Neg Hx    Colon polyps Neg Hx    Esophageal cancer Neg Hx    Rectal cancer Neg Hx    Stomach cancer Neg Hx    Social History   Socioeconomic History   Marital status: Married    Spouse name: Not on file   Number of children: 2   Years of education: Not on file   Highest education level: 12th grade  Occupational History   Occupation: Engineer, 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   Social Drivers of Corporate investment banker Strain: Low Risk  (02/21/2024)   Overall Financial Resource Strain (CARDIA)    Difficulty of Paying Living Expenses: Not very hard  Food Insecurity: No Food Insecurity (02/21/2024)   Hunger Vital Sign    Worried About Running Out of Food in the Last Year: Never true    Ran Out of Food in the Last Year: Never true  Transportation Needs: No Transportation Needs (02/21/2024)   PRAPARE - Administrator, Civil Service (Medical): No    Lack of Transportation (Non-Medical): No  Physical Activity: Sufficiently Active (02/21/2024)   Exercise Vital Sign    Days of Exercise per Week: 5 days    Minutes of Exercise per Session: 30 min  Stress: No Stress Concern Present (02/21/2024)   [employer] of Occupational Health - Occupational Stress Questionnaire    Feeling of Stress : Not at all  Social Connections: Moderately Integrated (02/21/2024)   Social Connection and Isolation Panel [NHANES]    Frequency of Communication with Friends and Family: Three times a week    Frequency of Social Gatherings with Friends and Family: Twice a week    Attends Religious Services: More than 4 times per year    Active Member of [employer] or Organizations: No    Attends Engineer, structural: Never    Marital Status: Married     Tobacco Counseling Counseling given: Not Answered    Clinical Intake:  Pre-visit preparation completed: Yes  Pain : No/denies pain     BMI - recorded: 25.92 Nutritional Status: BMI 25 -29 Overweight Nutritional Risks: None Diabetes: No  Lab Results  Component Value Date   HGBA1C 5.8 04/30/2021     How often do you need to have someone help you when you read instructions, pamphlets, or other written materials from your doctor or pharmacy?: 1 - Never What is the last grade level you completed in school?: 12  Interpreter Needed?: No  Information entered by :: Susa Engman, CMA   Activities of Daily Living     02/21/2024    2:30 PM  In your present state of health, do you have any difficulty performing the following activities:  Hearing? 0  Comment denies difficulty  Vision? 0  Comment had eye exam 01/2023 and will reach out to Dr Daneil [facility]unk[facility] at [facility]  Difficulty concentrating or making decisions? 0  Walking or climbing stairs? 0  Dressing or bathing? 0  Doing errands, shopping? 0  Preparing Food and eating ? N  Using the Toilet? N  In the past six months, have you accidently leaked urine? N  Do you have problems with loss of bowel control? N  Managing your Medications? N  Managing your Finances? N  Housekeeping or managing your Housekeeping? N    Patient Care Team: Crecencio Dodge, Candida Chalk, DO as PCP - General Sandee Crook Margart Shears, MD as PCP - Cardiology (Cardiology) Thurman Flores, MD as Consulting Physician (Obstetrics and Gynecology) Swaziland, Amy, MD as Consulting Physician (Dermatology) Dohmeier, Raoul Byes, MD as Consulting Physician (Neurology) Day, Lewis Red, [facility] (Inactive) as Pharmacist (Pharmacist)  Indicate any recent Medical Services you may have received from other than [facility] providers in the past year (date may be approximate).     Assessment:    This is a routine wellness examination for Elene.  Hearing/Vision screen Hearing Screening -  Comments:: Denies difficulty Vision Screening - Comments:: 01/2023 and will call [facility] to schedule next exam   Goals Addressed             This Visit's Progress    Increase physical activity   On track     Walking      Patient Stated       Wants to maintain current activity level of daily walking.       Depression Screen     02/21/2024    2:40 PM 08/18/2023    4:38 PM 08/18/2023    4:20 PM 01/19/2023    3:44 PM 04/08/2022    2:47 PM 11/15/2021    1:07 PM 07/28/2021   11:11 AM  PHQ 2/9 Scores  PHQ - 2 Score 0 3 3 0 0 0 0  PHQ- 9 Score 5 10 10   0  0    Fall Risk     02/21/2024    2:48 PM 08/23/2023    1:05 PM 01/19/2023    4:16 PM 01/19/2023    3:40 PM 11/15/2021    1:05 PM  Fall Risk   Falls in the past year? 0 0 0 0 0  Number falls in past yr: 0  0 0 0  Injury with Fall? 0  0 0 0  Risk for fall due to : No Fall Risks  No Fall Risks No Fall Risks   Follow up Falls prevention discussed  Falls prevention discussed Falls prevention discussed Falls prevention discussed    MEDICARE RISK AT HOME:  Medicare Risk at Home Any stairs in or around the home?: No If so, are there any without handrails?: No Home free of loose throw rugs in walkways, pet beds, electrical cords, etc?: Yes Adequate lighting in your home to reduce risk of falls?: Yes Life alert?: No Use of a cane, walker or w/c?: Yes Grab bars in the bathroom?: No Shower chair or bench in shower?: Yes Elevated toilet seat or a handicapped toilet?: No   Cognitive Function: 6CIT completed    08/14/2017    9:26 AM 08/08/2016    8:40 AM 08/06/2015    1:27 PM  MMSE - Mini Mental State Exam  Orientation to time 5 5 5   Orientation to Place 5 5 5   Registration 3 3 3   Attention/ Calculation 5 5 5   Recall 3 3 3   Language- name 2 objects 2 2 2   Language- repeat 1 1 1   Language- follow 3 step command 3 3 3   Language- read & follow direction 1 1 1   Write a sentence 1 1 1   Copy design 1 1 1   Total score 30 30  30         02/21/2024    2:49 PM 01/19/2023    3:44 PM  6CIT Screen  What Year? 0 points 0 points  What month? 0 points 0 points  What time? 0 points 0 points  Count back from 20 0 points 0 points  Months in reverse 0 points 0 points  Repeat phrase 0 points 0 points  Total Score 0 points 0 points    Immunizations Immunization History  Administered Date(s) Administered   Fluad Quad(high Dose 65+) 07/04/2019, 07/02/2020, 07/12/2021, 07/29/2022   Influenza Split 07/24/2017   Influenza Whole 07/03/2006, 09/11/2007, 07/15/2008, 07/30/2009   Influenza, High Dose Seasonal PF 08/27/2014, 08/06/2015, 07/24/2017, 07/14/2023   Influenza,inj,Quad PF,6+ Mos 08/12/2013, 07/20/2018   PFIZER(Purple Top)SARS-COV-2 Vaccination 10/24/2019, 11/14/2019, 07/14/2020   Pfizer Covid-19 Vaccine  Bivalent Booster 68yrs & up 08/02/2021   Pfizer(Comirnaty)Fall Seasonal Vaccine 12 years and older 08/12/2022, 07/14/2023   Pneumococcal Conjugate-13 10/02/2015   Pneumococcal Polysaccharide-23 03/27/2014   Respiratory Syncytial Virus Vaccine,Recomb Aduvanted(Arexvy) 07/14/2023   Td 07/02/2020   Zoster Recombinant(Shingrix) 06/18/2018, 08/19/2018    Screening Tests Health Maintenance  Topic Date Due   COVID-19 Vaccine (7 - 2024-25 season) 01/12/2024   Medicare Annual Wellness (AWV)  01/19/2024   MAMMOGRAM  01/26/2024   INFLUENZA VACCINE  05/03/2024   Colonoscopy  04/12/2026   DTaP/Tdap/Td (2 - Tdap) 07/02/2030   Pneumonia Vaccine 32+ Years old  Completed   DEXA SCAN  Completed   Hepatitis C Screening  Completed   Zoster Vaccines- Shingrix  Completed   HPV VACCINES  Aged Out   Meningococcal B Vaccine  Aged Out    Health Maintenance  Health Maintenance Due  Topic Date Due   COVID-19 Vaccine (7 - 2024-25 season) 01/12/2024   Medicare Annual Wellness (AWV)  01/19/2024   MAMMOGRAM  01/26/2024   Health Maintenance Items Addressed: COVID -- pt will get at pharmacy if she chooses to do further vaccines  in the future. Mammogram -- pt is scheduled at [facility] in July.  Additional Screening:  Vision Screening: Recommended annual ophthalmology exams for early detection of glaucoma and other disorders of the eye.  Dental Screening: Recommended annual dental exams for proper oral hygiene  Community Resource Referral / Chronic Care Management: CRR required this visit?  No   CCM required this visit?  No   Plan:    I have personally reviewed and noted the following in the patient's chart:   Medical and social history Use of alcohol, tobacco or illicit drugs  Current medications and supplements including opioid prescriptions. Patient is not currently taking opioid prescriptions. Functional ability and status Nutritional status Physical activity Advanced directives List of other physicians Hospitalizations, surgeries, and ER visits in previous 12 months Vitals Screenings to include cognitive, depression, and falls Referrals and appointments  In addition, I have reviewed and discussed with patient certain preventive protocols, quality metrics, and best practice recommendations. A written personalized care plan for preventive services as well as general preventive health recommendations were provided to patient.   Susa Engman, CMA   02/21/2024   After Visit Summary: (MyChart) Due to this being a telephonic visit, the after visit summary with patients personalized plan was offered to patient via MyChart   Notes: Nothing significant to report at this time. Pt states she will call back at a later time to schedule next AWV.

## 2024-02-21 NOTE — Patient Instructions (Signed)
 Laura Hopkins , Thank you for taking time out of your busy schedule to complete your Annual Wellness Visit with me. I enjoyed our conversation and look forward to speaking with you again next year. I, as well as your care team,  appreciate your ongoing commitment to your health goals. Please review the following plan we discussed and let me know if I can assist you in the future. Your Game plan/ To Do List     Follow up Visits: Next Medicare AWV with our clinical staff:   Please call and schedule your next wellness visit at [phone] for 02/21/25 or after.   Next Office Visit with your provider: 02/29/24  Clinician Recommendations:  Aim for 30 minutes of exercise or brisk walking, 6-8 glasses of water, and 5 servings of fruits and vegetables each day. Sounds like you are on track with your goals, keep up the good work!      This is a list of the screenings recommended for you and due dates:  As we discussed, check with your pharmacy if you decide to get further COVID vaccines. Please ask your gynecologist to forward us  a copy of your next mammogram when that is completed in July.   See attachments for Preventive Care and Fall Prevention Tips.

## 2024-02-29 ENCOUNTER — Encounter: Payer: Self-pay | Admitting: Family Medicine

## 2024-02-29 ENCOUNTER — Ambulatory Visit (INDEPENDENT_AMBULATORY_CARE_PROVIDER_SITE_OTHER): Admitting: Family Medicine

## 2024-02-29 VITALS — BP 110/80 | HR 52 | Temp 98.3°F | Resp 16 | Ht 64.0 in | Wt 151.2 lb

## 2024-02-29 DIAGNOSIS — I10 Essential (primary) hypertension: Secondary | ICD-10-CM | POA: Diagnosis not present

## 2024-02-29 NOTE — Assessment & Plan Note (Signed)
 Well controlled, no changes to meds. Encouraged heart healthy diet such as the DASH diet and exercise as tolerated.

## 2024-02-29 NOTE — Progress Notes (Signed)
 Established Patient Office Visit  Subjective   Patient ID: Laura Hopkins, female    DOB: 1946/02/15  Age: 78 y.o. MRN: [id number]  Chief Complaint  Patient presents with   Hypertension    Pt states checking bps at home and was 118/78 this morning   Follow-up    HPI Discussed the use of AI scribe software for clinical note transcription with the patient, who gave verbal consent to proceed.  History of Present Illness Laura Hopkins is a 78 year old female with hypertension who presents for follow-up after restarting spironolactone .  She feels significantly better since restarting spironolactone , with her blood pressure now well-controlled. She experienced a single headache approximately five days after her last visit, which was alleviated with Tylenol . No swelling in her ankles is noted.  She inquires about the continuation of a medication prescribed for nausea, which she takes every morning 30 minutes before eating.   Patient Active Problem List   Diagnosis Date Noted   Depression with anxiety 08/18/2023   Cervical radiculopathy 01/09/2023   Seasonal allergies 07/12/2021   Preoperative examination 04/30/2021   External hemorrhoid 09/15/2020   Acute vaginitis 09/15/2020   Colitis 09/15/2020   Depression, major, single episode, mild (HCC) 03/23/2020   History of chronic cough 03/23/2020   OSA on CPAP 03/23/2020   Sleep related choking sensation 01/26/2020   Hyperlipidemia 12/26/2019   Diffuse wheezing 12/24/2019   Snoring 12/24/2019   Laryngopharyngeal reflux disease 12/24/2019   Coughing 12/24/2019   Bronchitis 11/06/2019   SOB (shortness of breath) 03/11/2019   Insomnia 12/23/2017   Lower extremity edema 07/31/2017   Migraine 05/23/2017   SINUSITIS - ACUTE-NOS 03/17/2009   ACUTE PHARYNGITIS 02/26/2009   SKIN CANCER, HX OF 01/08/2008   Essential hypertension 02/07/2007   URI 02/07/2007   Headache 02/07/2007   Past Medical History:  Diagnosis Date   Acute  pharyngitis 02/26/2009   Qualifier: Diagnosis of  By: Tracy Friedlander     Arthritis    hands and neck    Asthma    Essential hypertension 02/07/2007   Qualifier: Diagnosis of  By: Tracy Friedlander     Headache(784.0)    HTN (hypertension)    Insomnia 12/23/2017   Lower extremity edema 07/31/2017   Migraine 05/23/2017   Pneumonia    SINUSITIS - ACUTE-NOS 03/17/2009   Qualifier: Diagnosis of  By: Tracy Friedlander     Skin cancer    basal cell   SKIN CANCER, HX OF 01/08/2008   Annotation: BSC and SCC Qualifier: Diagnosis of  By: Tracy Friedlander   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 Amy [place]    URI 02/07/2007   Qualifier: Diagnosis of  By: Tracy Friedlander     Past Surgical History:  Procedure Laterality Date   ABDOMINAL HYSTERECTOMY  1995   AUGMENTATION MAMMAPLASTY N/A    Removed 05/25/2021   BREAST ENHANCEMENT SURGERY Bilateral    BREAST IMPLANT REMOVAL Bilateral    with breast lift   CATARACT EXTRACTION W/ INTRAOCULAR LENS IMPLANT Bilateral 03/03/2017   cataract sx with lens implant   COLONOSCOPY  2007   colon--negative   NASAL SEPTUM SURGERY  1983   REDUCTION MAMMAPLASTY     breast lift, 05/25/2021   ROTATOR CUFF REPAIR Right 2000   TUBAL LIGATION  1978   WISDOM TOOTH EXTRACTION     Social History   Tobacco Use   Smoking status: Former    Current  packs/day: 0.00    Average packs/day: 0.3 packs/day for 35.4 years (10.6 ttl pk-yrs)    Types: Cigarettes    Start date: 10/04/1963    Quit date: 03/07/1999    Years since quitting: 25.0   Smokeless tobacco: Never  Vaping Use   Vaping status: Never Used  Substance Use Topics   Alcohol use: No    Alcohol/week: 0.0 standard drinks of alcohol   Drug use: No   Social History   Socioeconomic History   Marital status: Married    Spouse name: Not on file   Number of children: 2   Years of education: Not on file   Highest education level: 12th grade  Occupational History   Occupation:  Engineer, manufacturing systems: UNEMPLOYED    Comment: retired  Tobacco Use   Smoking status: Former    Current packs/day: 0.00    Average packs/day: 0.3 packs/day for 35.4 years (10.6 ttl pk-yrs)    Types: Cigarettes    Start date: 10/04/1963    Quit date: 03/07/1999    Years since quitting: 25.0   Smokeless tobacco: Never  Vaping Use   Vaping status: Never Used  Substance and Sexual Activity   Alcohol use: No    Alcohol/week: 0.0 standard drinks of alcohol   Drug use: No   Sexual activity: Never    Partners: Male  Other Topics Concern   Not on file  Social History Narrative   Completed high school and took some classes at [employer].   Social Drivers of Corporate investment banker Strain: Low Risk  (02/21/2024)   Overall Financial Resource Strain (CARDIA)    Difficulty of Paying Living Expenses: Not very hard  Food Insecurity: No Food Insecurity (02/21/2024)   Hunger Vital Sign    Worried About Running Out of Food in the Last Year: Never true    Ran Out of Food in the Last Year: Never true  Transportation Needs: No Transportation Needs (02/21/2024)   PRAPARE - Administrator, Civil Service (Medical): No    Lack of Transportation (Non-Medical): No  Physical Activity: Sufficiently Active (02/21/2024)   Exercise Vital Sign    Days of Exercise per Week: 5 days    Minutes of Exercise per Session: 30 min  Stress: No Stress Concern Present (02/21/2024)   [employer] of Occupational Health - Occupational Stress Questionnaire    Feeling of Stress : Not at all  Social Connections: Moderately Integrated (02/21/2024)   Social Connection and Isolation Panel [NHANES]    Frequency of Communication with Friends and Family: Three times a week    Frequency of Social Gatherings with Friends and Family: Twice a week    Attends Religious Services: More than 4 times per year    Active Member of [employer] or Organizations: No    Attends Banker Meetings: Never     Marital Status: Married  Catering manager Violence: Not At Risk (02/21/2024)   Humiliation, Afraid, Rape, and Kick questionnaire    Fear of Current or Ex-Partner: No    Emotionally Abused: No    Physically Abused: No    Sexually Abused: No   Family Status  Relation Name Status   Mother  Deceased   Father  Deceased   Sister  Alive   Brother  Deceased   Son  Alive   Daughter  Alive   Neg Hx  (Not Specified)  No partnership data on file   Family History  Problem Relation Age of Onset   Hypertension Mother    Dementia Mother    Diabetes Father    Hypertension Father    Arthritis Father    Prostate cancer Father    Melanoma Brother    Colon cancer Neg Hx    Colon polyps Neg Hx    Esophageal cancer Neg Hx    Rectal cancer Neg Hx    Stomach cancer Neg Hx    Allergies  Allergen Reactions   Avapro  [Irbesartan ] Cough   Codeine Other (See Comments)    Makes pt hyper    Metoprolol  Cough    Metallic taste , nasal drainage    Norvasc  [Amlodipine  Besylate] Swelling      Review of Systems  Constitutional:  Negative for chills, fever and malaise/fatigue.  HENT:  Negative for congestion and hearing loss.   Eyes:  Negative for blurred vision and discharge.  Respiratory:  Negative for cough, sputum production and shortness of breath.   Cardiovascular:  Negative for chest pain, palpitations and leg swelling.  Gastrointestinal:  Negative for abdominal pain, blood in stool, constipation, diarrhea, heartburn, nausea and vomiting.  Genitourinary:  Negative for dysuria, frequency, hematuria and urgency.  Musculoskeletal:  Negative for back pain, falls and myalgias.  Skin:  Negative for rash.  Neurological:  Negative for dizziness, sensory change, loss of consciousness, weakness and headaches.  Endo/Heme/Allergies:  Negative for environmental allergies. Does not bruise/bleed easily.  Psychiatric/Behavioral:  Negative for depression and suicidal ideas. The patient is not nervous/anxious  and does not have insomnia.       Objective:     BP 110/80 (BP Location: Left Arm, Patient Position: Sitting, Cuff Size: Normal)   Pulse (!) 52   Temp 98.3 F (36.8 C) (Oral)   Resp 16   Ht 5\' 4"  (1.626 m)   Wt 151 lb 3.2 oz (68.6 kg)   SpO2 100%   BMI 25.95 kg/m  BP Readings from Last 3 Encounters:  02/29/24 110/80  02/09/24 (!) 140/90  08/23/23 (!) 140/94   Wt Readings from Last 3 Encounters:  02/29/24 151 lb 3.2 oz (68.6 kg)  02/21/24 151 lb (68.5 kg)  02/09/24 151 lb (68.5 kg)   SpO2 Readings from Last 3 Encounters:  02/29/24 100%  02/09/24 97%  08/23/23 96%      Physical Exam Vitals and nursing note reviewed.  Constitutional:      General: She is not in acute distress.    Appearance: Normal appearance. She is well-developed.  HENT:     Head: Normocephalic and atraumatic.  Eyes:     General: No scleral icterus.       Right eye: No discharge.        Left eye: No discharge.  Cardiovascular:     Rate and Rhythm: Normal rate and regular rhythm.     Heart sounds: No murmur heard. Pulmonary:     Effort: Pulmonary effort is normal. No respiratory distress.     Breath sounds: Normal breath sounds.  Musculoskeletal:        General: Normal range of motion.     Cervical back: Normal range of motion and neck supple.     Right lower leg: No edema.     Left lower leg: No edema.  Skin:    General: Skin is warm and dry.  Neurological:     Mental Status: She is alert and oriented to person, place, and time.  Psychiatric:        Mood and  Affect: Mood normal.        Behavior: Behavior normal.        Thought Content: Thought content normal.        Judgment: Judgment normal.      No results found for any visits on 02/29/24.  Last CBC Lab Results  Component Value Date   WBC 6.9 02/09/2024   HGB 14.3 02/09/2024   HCT 43.9 02/09/2024   MCV 93.6 02/09/2024   MCH 30.5 02/09/2024   RDW 13.1 02/09/2024   PLT 278 02/09/2024   Last metabolic panel Lab Results   Component Value Date   GLUCOSE 90 02/09/2024   NA 139 02/09/2024   K 4.9 02/09/2024   CL 102 02/09/2024   CO2 30 02/09/2024   BUN 10 02/09/2024   CREATININE 0.92 02/09/2024   EGFR 65 02/09/2024   CALCIUM 9.7 02/09/2024   PROT 6.6 02/09/2024   ALBUMIN 4.3 01/09/2023   LABGLOB 2.6 11/06/2019   AGRATIO 1.7 11/06/2019   BILITOT 0.7 02/09/2024   ALKPHOS 72 01/09/2023   AST 22 02/09/2024   ALT 14 02/09/2024   ANIONGAP 9 09/01/2020   Last lipids Lab Results  Component Value Date   CHOL 187 04/08/2022   HDL 63 04/08/2022   LDLCALC 102 (H) 04/08/2022   TRIG 120 04/08/2022   CHOLHDL 3.0 04/08/2022   Last hemoglobin A1c Lab Results  Component Value Date   HGBA1C 5.8 04/30/2021   Last thyroid  functions Lab Results  Component Value Date   TSH 1.20 02/09/2024   Last vitamin D  Lab Results  Component Value Date   VD25OH 52.94 12/26/2019   Last vitamin B12 and Folate Lab Results  Component Value Date   VITAMINB12 275 12/26/2019      The 10-year ASCVD risk score (Arnett DK, et al., 2019) is: 15.9%    Assessment & Plan:   Problem List Items Addressed This Visit       Unprioritized   Essential hypertension   Well controlled, no changes to meds. Encouraged heart healthy diet such as the DASH diet and exercise as tolerated.        Other Visit Diagnoses       Primary hypertension    -  Primary      Assessment and Plan Assessment & Plan Hypertension   Hypertension is well-controlled with spironolactone . Blood pressure remains stable, and headaches are rare, with one episode resolved by acetaminophen . Absence of ankle edema suggests no fluid retention. Emphasized the importance of medication adherence to prevent complications like stroke and myocardial infarction. Continue spironolactone  as prescribed. Follow up in three months to monitor blood pressure and overall health.  Gastroesophageal reflux disease (GERD)   GERD symptoms are managed with preprandial  medication. Discussed a trial of discontinuation to assess symptom persistence, with the option to resume if necessary. Emphasized dietary modifications, including avoiding acidic foods and caffeine. Addressed the potential impact of long-term medication on bone density, reassuring regular monitoring and calcium supplementation. Use medication as needed, especially before consuming trigger foods. Attempt to discontinue GERD medication and monitor symptoms. Resume medication if symptoms persist or when consuming trigger foods. Continue regular bone density monitoring and calcium supplementation.   Return in about 3 months (around 05/31/2024), or if symptoms worsen or fail to improve, for hypertension.    Kwamane Whack R Lowne Chase, DO

## 2024-02-29 NOTE — Patient Instructions (Signed)

## 2024-03-02 ENCOUNTER — Other Ambulatory Visit: Payer: Self-pay | Admitting: Family Medicine

## 2024-03-02 DIAGNOSIS — R1013 Epigastric pain: Secondary | ICD-10-CM

## 2024-03-08 ENCOUNTER — Encounter

## 2024-03-13 ENCOUNTER — Other Ambulatory Visit (HOSPITAL_BASED_OUTPATIENT_CLINIC_OR_DEPARTMENT_OTHER): Payer: Self-pay | Admitting: Family Medicine

## 2024-03-13 DIAGNOSIS — Z1231 Encounter for screening mammogram for malignant neoplasm of breast: Secondary | ICD-10-CM

## 2024-03-21 ENCOUNTER — Ambulatory Visit (HOSPITAL_BASED_OUTPATIENT_CLINIC_OR_DEPARTMENT_OTHER)
Admission: RE | Admit: 2024-03-21 | Discharge: 2024-03-21 | Disposition: A | Discharge status: Home / Self Care | Source: Ambulatory Visit | Attending: Family Medicine | Admitting: Family Medicine

## 2024-03-21 ENCOUNTER — Encounter: Admitting: Family Medicine

## 2024-03-21 DIAGNOSIS — Z1231 Encounter for screening mammogram for malignant neoplasm of breast: Secondary | ICD-10-CM | POA: Diagnosis not present

## 2024-03-22 ENCOUNTER — Encounter: Payer: Self-pay | Admitting: Family Medicine

## 2024-03-22 ENCOUNTER — Ambulatory Visit (INDEPENDENT_AMBULATORY_CARE_PROVIDER_SITE_OTHER): Admitting: Family Medicine

## 2024-03-22 VITALS — BP 120/86 | HR 77 | Temp 98.1°F | Resp 18 | Ht 64.0 in | Wt 152.8 lb

## 2024-03-22 DIAGNOSIS — E785 Hyperlipidemia, unspecified: Secondary | ICD-10-CM

## 2024-03-22 DIAGNOSIS — I10 Essential (primary) hypertension: Secondary | ICD-10-CM | POA: Diagnosis not present

## 2024-03-22 DIAGNOSIS — G43809 Other migraine, not intractable, without status migrainosus: Secondary | ICD-10-CM | POA: Diagnosis not present

## 2024-03-22 DIAGNOSIS — Z0000 Encounter for general adult medical examination without abnormal findings: Secondary | ICD-10-CM | POA: Diagnosis not present

## 2024-03-22 DIAGNOSIS — F5101 Primary insomnia: Secondary | ICD-10-CM | POA: Diagnosis not present

## 2024-03-22 MED ORDER — ESZOPICLONE 3 MG PO TABS: 3.0000 mg | ORAL_TABLET | Freq: Every day | ORAL | 2 refills | Status: DC

## 2024-03-22 MED ORDER — AIMOVIG 70 MG/ML ~~LOC~~ SOAJ
70.0000 mg | SUBCUTANEOUS | 2 refills | Status: DC
Start: 2024-03-22 — End: 2024-07-12

## 2024-03-22 MED ORDER — ESTRADIOL 0.05 MG/24HR TD PTTW: 1.0000 | MEDICATED_PATCH | TRANSDERMAL | 11 refills | Status: DC

## 2024-03-22 NOTE — Progress Notes (Unsigned)
 Established Patient Office Visit  Subjective   Patient ID: Laura Hopkins, female    DOB: Nov 15, 1945  Age: 78 y.o. MRN: [id number]  Chief Complaint  Patient presents with  . Annual Exam    Pt states not fasting.    HPI .hp Patient Active Problem List   Diagnosis Date Noted  . Depression with anxiety 08/18/2023  . Cervical radiculopathy 01/09/2023  . Seasonal allergies 07/12/2021  . Preoperative examination 04/30/2021  . External hemorrhoid 09/15/2020  . Acute vaginitis 09/15/2020  . Colitis 09/15/2020  . Depression, major, single episode, mild (HCC) 03/23/2020  . History of chronic cough 03/23/2020  . OSA on CPAP 03/23/2020  . Sleep related choking sensation 01/26/2020  . Hyperlipidemia 12/26/2019  . Diffuse wheezing 12/24/2019  . Snoring 12/24/2019  . Laryngopharyngeal reflux disease 12/24/2019  . Coughing 12/24/2019  . Bronchitis 11/06/2019  . SOB (shortness of breath) 03/11/2019  . Insomnia 12/23/2017  . Lower extremity edema 07/31/2017  . Migraine 05/23/2017  . SINUSITIS - ACUTE-NOS 03/17/2009  . ACUTE PHARYNGITIS 02/26/2009  . SKIN CANCER, HX OF 01/08/2008  . Essential hypertension 02/07/2007  . URI 02/07/2007  . Headache 02/07/2007   Past Medical History:  Diagnosis Date  . Acute pharyngitis 02/26/2009   Qualifier: Diagnosis of  By: Antonio ROSALEA Rockers    . Arthritis    hands and neck   . Asthma   . Essential hypertension 02/07/2007   Qualifier: Diagnosis of  By: Antonio ROSALEA Rockers    . Headache(784.0)   . HTN (hypertension)   . Insomnia 12/23/2017  . Lower extremity edema 07/31/2017  . Migraine 05/23/2017  . Pneumonia   . SINUSITIS - ACUTE-NOS 03/17/2009   Qualifier: Diagnosis of  By: Antonio ROSALEA Rockers    . Skin cancer    basal cell  . 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 Amy [place]   . URI 02/07/2007   Qualifier:  Diagnosis of  By: Antonio ROSALEA Rockers     Past Surgical History:  Procedure Laterality Date  . ABDOMINAL HYSTERECTOMY  1995  . AUGMENTATION MAMMAPLASTY N/A    Removed 05/25/2021  . BREAST ENHANCEMENT SURGERY Bilateral   . BREAST IMPLANT REMOVAL Bilateral    with breast lift  . CATARACT EXTRACTION W/ INTRAOCULAR LENS IMPLANT Bilateral 03/03/2017   cataract sx with lens implant  . COLONOSCOPY  2007   colon--negative  . NASAL SEPTUM SURGERY  1983  . REDUCTION MAMMAPLASTY     breast lift, 05/25/2021  . ROTATOR CUFF REPAIR Right 2000  . TUBAL LIGATION  1978  . WISDOM TOOTH EXTRACTION     Social History   Tobacco Use  . Smoking status: Former    Current packs/day: 0.00    Average packs/day: 0.3 packs/day for 35.4 years (10.6 ttl pk-yrs)    Types: Cigarettes    Start date: 10/04/1963    Quit date: 03/07/1999    Years since quitting: 25.0  . Smokeless tobacco: Never  Vaping Use  . Vaping status: Never Used  Substance Use Topics  . Alcohol use: No    Alcohol/week: 0.0 standard drinks of alcohol  . Drug use: No   Social History   Socioeconomic History  . Marital status: Married    Spouse name: Not on file  . Number of children: 2  . Years of education: Not on file  .  Highest education level: 12th grade  Occupational History  . Occupation: Engineer, manufacturing systems: UNEMPLOYED    Comment: retired  Tobacco Use  . Smoking status: Former    Current packs/day: 0.00    Average packs/day: 0.3 packs/day for 35.4 years (10.6 ttl pk-yrs)    Types: Cigarettes    Start date: 10/04/1963    Quit date: 03/07/1999    Years since quitting: 25.0  . Smokeless tobacco: Never  Vaping Use  . Vaping status: Never Used  Substance and Sexual Activity  . Alcohol use: No    Alcohol/week: 0.0 standard drinks of alcohol  . Drug use: No  . Sexual activity: Never    Partners: Male  Other Topics Concern  . Not on file  Social History Narrative   Completed high school and took some classes at  [employer].   Social Drivers of Corporate investment banker Strain: Low Risk  (03/21/2024)   Overall Financial Resource Strain (CARDIA)   . Difficulty of Paying Living Expenses: Not hard at all  Food Insecurity: No Food Insecurity (03/21/2024)   Hunger Vital Sign   . Worried About Programme researcher, broadcasting/film/video in the Last Year: Never true   . Ran Out of Food in the Last Year: Never true  Transportation Needs: No Transportation Needs (03/21/2024)   PRAPARE - Transportation   . Lack of Transportation (Medical): No   . Lack of Transportation (Non-Medical): No  Physical Activity: Sufficiently Active (03/21/2024)   Exercise Vital Sign   . Days of Exercise per Week: 5 days   . Minutes of Exercise per Session: 30 min  Stress: Stress Concern Present (03/21/2024)   [employer] of Occupational Health - Occupational Stress Questionnaire   . Feeling of Stress: To some extent  Social Connections: Socially Integrated (03/21/2024)   Social Connection and Isolation Panel   . Frequency of Communication with Friends and Family: More than three times a week   . Frequency of Social Gatherings with Friends and Family: Once a week   . Attends Religious Services: More than 4 times per year   . Active Member of Clubs or Organizations: Yes   . Attends Banker Meetings: 1 to 4 times per year   . Marital Status: Married  Catering manager Violence: Not At Risk (02/21/2024)   Humiliation, Afraid, Rape, and Kick questionnaire   . Fear of Current or Ex-Partner: No   . Emotionally Abused: No   . Physically Abused: No   . Sexually Abused: No   Family Status  Relation Name Status  . Mother  Deceased  . Father  Deceased  . Sister  Alive  . Brother  Deceased  . Son  Alive  . Daughter  Alive  . Neg Hx  (Not Specified)  No partnership data on file   Family History  Problem Relation Age of Onset  . Hypertension Mother   . Dementia Mother   . Diabetes Father   . Hypertension Father   .  Arthritis Father   . Prostate cancer Father   . Melanoma Brother   . Colon cancer Neg Hx   . Colon polyps Neg Hx   . Esophageal cancer Neg Hx   . Rectal cancer Neg Hx   . Stomach cancer Neg Hx    Allergies  Allergen Reactions  . Avapro  [Irbesartan ] Cough  . Codeine Other (See Comments)    Makes pt hyper   . Metoprolol  Cough    Metallic  taste , nasal drainage   . Norvasc  [Amlodipine  Besylate] Swelling      Review of Systems  Constitutional:  Negative for chills, fever and malaise/fatigue.  HENT:  Negative for congestion and hearing loss.   Eyes:  Negative for blurred vision and discharge.  Respiratory:  Negative for cough, sputum production and shortness of breath.   Cardiovascular:  Negative for chest pain, palpitations and leg swelling.  Gastrointestinal:  Negative for abdominal pain, blood in stool, constipation, diarrhea, heartburn, nausea and vomiting.  Genitourinary:  Negative for dysuria, frequency, hematuria and urgency.  Musculoskeletal:  Negative for back pain, falls and myalgias.  Skin:  Negative for rash.  Neurological:  Negative for dizziness, sensory change, loss of consciousness, weakness and headaches.  Endo/Heme/Allergies:  Negative for environmental allergies. Does not bruise/bleed easily.  Psychiatric/Behavioral:  Negative for depression and suicidal ideas. The patient is not nervous/anxious and does not have insomnia.       Objective:     BP 120/86 (BP Location: Left Arm, Patient Position: Sitting, Cuff Size: Normal)   Pulse 77   Temp 98.1 F (36.7 C) (Oral)   Resp 18   Ht 5' 4 (1.626 m)   Wt 152 lb 12.8 oz (69.3 kg)   SpO2 97%   BMI 26.23 kg/m  BP Readings from Last 3 Encounters:  03/22/24 120/86  02/29/24 110/80  02/09/24 (!) 140/90   Wt Readings from Last 3 Encounters:  03/22/24 152 lb 12.8 oz (69.3 kg)  02/29/24 151 lb 3.2 oz (68.6 kg)  02/21/24 151 lb (68.5 kg)   SpO2 Readings from Last 3 Encounters:  03/22/24 97%  02/29/24 100%   02/09/24 97%      Physical Exam Vitals and nursing note reviewed.  Constitutional:      General: She is not in acute distress.    Appearance: Normal appearance. She is well-developed.  HENT:     Head: Normocephalic and atraumatic.     Right Ear: Tympanic membrane, ear canal and external ear normal. There is no impacted cerumen.     Left Ear: Tympanic membrane, ear canal and external ear normal. There is no impacted cerumen.     Nose: Nose normal.     Mouth/Throat:     Mouth: Mucous membranes are moist.     Pharynx: Oropharynx is clear. No oropharyngeal exudate or posterior oropharyngeal erythema.   Eyes:     General: No scleral icterus.       Right eye: No discharge.        Left eye: No discharge.     Conjunctiva/sclera: Conjunctivae normal.     Pupils: Pupils are equal, round, and reactive to light.   Neck:     Thyroid : No thyromegaly or thyroid  tenderness.     Vascular: No JVD.   Cardiovascular:     Rate and Rhythm: Normal rate and regular rhythm.     Heart sounds: Normal heart sounds. No murmur heard. Pulmonary:     Effort: Pulmonary effort is normal. No respiratory distress.     Breath sounds: Normal breath sounds.  Abdominal:     General: Bowel sounds are normal. There is no distension.     Palpations: Abdomen is soft. There is no mass.     Tenderness: There is no abdominal tenderness. There is no guarding or rebound.  Genitourinary:    Vagina: Normal.   Musculoskeletal:        General: Normal range of motion.     Cervical back: Normal range of  motion and neck supple.     Right lower leg: No edema.     Left lower leg: No edema.  Lymphadenopathy:     Cervical: No cervical adenopathy.   Skin:    General: Skin is warm and dry.     Findings: No erythema or rash.   Neurological:     Mental Status: She is alert and oriented to person, place, and time.     Cranial Nerves: No cranial nerve deficit.     Deep Tendon Reflexes: Reflexes are normal and symmetric.    Psychiatric:        Mood and Affect: Mood normal.        Behavior: Behavior normal.        Thought Content: Thought content normal.        Judgment: Judgment normal.     No results found for any visits on 03/22/24.  Last CBC Lab Results  Component Value Date   WBC 6.9 02/09/2024   HGB 14.3 02/09/2024   HCT 43.9 02/09/2024   MCV 93.6 02/09/2024   MCH 30.5 02/09/2024   RDW 13.1 02/09/2024   PLT 278 02/09/2024   Last metabolic panel Lab Results  Component Value Date   GLUCOSE 90 02/09/2024   NA 139 02/09/2024   K 4.9 02/09/2024   CL 102 02/09/2024   CO2 30 02/09/2024   BUN 10 02/09/2024   CREATININE 0.92 02/09/2024   EGFR 65 02/09/2024   CALCIUM 9.7 02/09/2024   PROT 6.6 02/09/2024   ALBUMIN 4.3 01/09/2023   LABGLOB 2.6 11/06/2019   AGRATIO 1.7 11/06/2019   BILITOT 0.7 02/09/2024   ALKPHOS 72 01/09/2023   AST 22 02/09/2024   ALT 14 02/09/2024   ANIONGAP 9 09/01/2020   Last lipids Lab Results  Component Value Date   CHOL 187 04/08/2022   HDL 63 04/08/2022   LDLCALC 102 (H) 04/08/2022   TRIG 120 04/08/2022   CHOLHDL 3.0 04/08/2022   Last hemoglobin A1c Lab Results  Component Value Date   HGBA1C 5.8 04/30/2021   Last thyroid  functions Lab Results  Component Value Date   TSH 1.20 02/09/2024   Last vitamin D  Lab Results  Component Value Date   VD25OH 52.94 12/26/2019   Last vitamin B12 and Folate Lab Results  Component Value Date   VITAMINB12 275 12/26/2019      The 10-year ASCVD risk score (Arnett DK, et al., 2019) is: 18.7%    Assessment & Plan:   Problem List Items Addressed This Visit       Unprioritized   Hyperlipidemia   Relevant Orders   CBC with Differential/Platelet   Comprehensive metabolic panel with GFR   Lipid panel   TSH   Migraine   Relevant Medications   Erenumab -aooe (AIMOVIG ) 70 MG/ML SOAJ   Essential hypertension - Primary   Relevant Orders   CBC with Differential/Platelet   Comprehensive metabolic panel  with GFR   Lipid panel   TSH    No follow-ups on file.    Ascher Schroepfer R Lowne Chase, DO

## 2024-03-23 LAB — CBC WITH DIFFERENTIAL/PLATELET
Absolute Lymphocytes: 2287 {cells}/uL (ref 850–3900)
Absolute Monocytes: 685 {cells}/uL (ref 200–950)
Basophils Absolute: 71 {cells}/uL (ref 0–200)
Basophils Relative: 0.8 %
Eosinophils Absolute: 240 {cells}/uL (ref 15–500)
Eosinophils Relative: 2.7 %
HCT: 45 % (ref 35.0–45.0)
Hemoglobin: 14.6 g/dL (ref 11.7–15.5)
MCH: 31.1 pg (ref 27.0–33.0)
MCHC: 32.4 g/dL (ref 32.0–36.0)
MCV: 95.7 fL (ref 80.0–100.0)
MPV: 11 fL (ref 7.5–12.5)
Monocytes Relative: 7.7 %
Neutro Abs: 5616 {cells}/uL (ref 1500–7800)
Neutrophils Relative %: 63.1 %
Platelets: 258 10*3/uL (ref 140–400)
RBC: 4.7 10*6/uL (ref 3.80–5.10)
RDW: 13.3 % (ref 11.0–15.0)
Total Lymphocyte: 25.7 %
WBC: 8.9 10*3/uL (ref 3.8–10.8)

## 2024-03-23 LAB — LIPID PANEL
Cholesterol: 186 mg/dL (ref <200)
HDL: 69 mg/dL (ref >=50)
LDL Cholesterol (Calc): 93 mg/dL
Non-HDL Cholesterol (Calc): 117 mg/dL (ref <130)
Total CHOL/HDL Ratio: 2.7 (calc) (ref <5.0)
Triglycerides: 141 mg/dL (ref <150)

## 2024-03-23 LAB — COMPREHENSIVE METABOLIC PANEL WITH GFR
AG Ratio: 1.8 (calc) (ref 1.0–2.5)
ALT: 6 U/L (ref 6–29)
AST: 18 U/L (ref 10–35)
Albumin: 4.4 g/dL (ref 3.6–5.1)
Alkaline phosphatase (APISO): 76 U/L (ref 37–153)
BUN: 12 mg/dL (ref 7–25)
CO2: 27 mmol/L (ref 20–32)
Calcium: 10 mg/dL (ref 8.6–10.4)
Chloride: 100 mmol/L (ref 98–110)
Creat: 0.99 mg/dL (ref 0.60–1.00)
Globulin: 2.4 g/dL (ref 1.9–3.7)
Glucose, Bld: 89 mg/dL (ref 65–99)
Potassium: 4.8 mmol/L (ref 3.5–5.3)
Sodium: 138 mmol/L (ref 135–146)
Total Bilirubin: 0.7 mg/dL (ref 0.2–1.2)
Total Protein: 6.8 g/dL (ref 6.1–8.1)
eGFR: 59 mL/min/{1.73_m2} — ABNORMAL LOW (ref >=60)

## 2024-03-23 LAB — TSH: TSH: 1.72 m[IU]/L (ref 0.40–4.50)

## 2024-03-25 NOTE — Assessment & Plan Note (Signed)
 Encourage heart healthy diet such as MIND or DASH diet, increase exercise, avoid trans fats, simple carbohydrates and processed foods, consider a krill or fish or flaxseed oil cap daily.

## 2024-03-25 NOTE — Assessment & Plan Note (Signed)
 Well controlled, no changes to meds. Encouraged heart healthy diet such as the DASH diet and exercise as tolerated.

## 2024-03-26 DIAGNOSIS — L821 Other seborrheic keratosis: Secondary | ICD-10-CM | POA: Diagnosis not present

## 2024-03-26 DIAGNOSIS — D2272 Melanocytic nevi of left lower limb, including hip: Secondary | ICD-10-CM | POA: Diagnosis not present

## 2024-03-26 DIAGNOSIS — L570 Actinic keratosis: Secondary | ICD-10-CM | POA: Diagnosis not present

## 2024-03-26 DIAGNOSIS — Z85828 Personal history of other malignant neoplasm of skin: Secondary | ICD-10-CM | POA: Diagnosis not present

## 2024-03-26 DIAGNOSIS — D1801 Hemangioma of skin and subcutaneous tissue: Secondary | ICD-10-CM | POA: Diagnosis not present

## 2024-03-31 ENCOUNTER — Ambulatory Visit: Payer: Self-pay | Admitting: Family Medicine

## 2024-04-16 DIAGNOSIS — Z961 Presence of intraocular lens: Secondary | ICD-10-CM | POA: Diagnosis not present

## 2024-04-16 DIAGNOSIS — H40013 Open angle with borderline findings, low risk, bilateral: Secondary | ICD-10-CM | POA: Diagnosis not present

## 2024-04-16 DIAGNOSIS — H26493 Other secondary cataract, bilateral: Secondary | ICD-10-CM | POA: Diagnosis not present

## 2024-04-16 DIAGNOSIS — H04123 Dry eye syndrome of bilateral lacrimal glands: Secondary | ICD-10-CM | POA: Diagnosis not present

## 2024-04-22 ENCOUNTER — Encounter: Payer: Self-pay | Admitting: Family Medicine

## 2024-05-07 ENCOUNTER — Ambulatory Visit (INDEPENDENT_AMBULATORY_CARE_PROVIDER_SITE_OTHER): Admitting: Family Medicine

## 2024-05-07 ENCOUNTER — Encounter: Payer: Self-pay | Admitting: Family Medicine

## 2024-05-07 ENCOUNTER — Ambulatory Visit (HOSPITAL_BASED_OUTPATIENT_CLINIC_OR_DEPARTMENT_OTHER)
Admission: RE | Admit: 2024-05-07 | Discharge: 2024-05-07 | Disposition: A | Discharge status: Home / Self Care | Source: Ambulatory Visit | Attending: Family Medicine | Admitting: Family Medicine

## 2024-05-07 VITALS — BP 120/88 | HR 68 | Temp 98.0°F | Resp 18 | Ht 64.0 in | Wt 152.4 lb

## 2024-05-07 DIAGNOSIS — R109 Unspecified abdominal pain: Secondary | ICD-10-CM | POA: Diagnosis not present

## 2024-05-07 DIAGNOSIS — R1013 Epigastric pain: Secondary | ICD-10-CM

## 2024-05-07 DIAGNOSIS — I10 Essential (primary) hypertension: Secondary | ICD-10-CM | POA: Diagnosis not present

## 2024-05-07 DIAGNOSIS — R110 Nausea: Secondary | ICD-10-CM | POA: Diagnosis not present

## 2024-05-07 DIAGNOSIS — G43809 Other migraine, not intractable, without status migrainosus: Secondary | ICD-10-CM

## 2024-05-07 DIAGNOSIS — E785 Hyperlipidemia, unspecified: Secondary | ICD-10-CM

## 2024-05-07 LAB — COMPREHENSIVE METABOLIC PANEL WITH GFR
ALT: 5 U/L (ref 0–35)
AST: 16 U/L (ref 0–37)
Albumin: 4.5 g/dL (ref 3.5–5.2)
Alkaline Phosphatase: 80 U/L (ref 39–117)
BUN: 10 mg/dL (ref 6–23)
CO2: 30 meq/L (ref 19–32)
Calcium: 10.6 mg/dL — ABNORMAL HIGH (ref 8.4–10.5)
Chloride: 102 meq/L (ref 96–112)
Creatinine, Ser: 0.94 mg/dL (ref 0.40–1.20)
GFR: 59.3 mL/min — ABNORMAL LOW (ref >60.00)
Glucose, Bld: 112 mg/dL — ABNORMAL HIGH (ref 70–99)
Potassium: 4.7 meq/L (ref 3.5–5.1)
Sodium: 141 meq/L (ref 135–145)
Total Bilirubin: 0.9 mg/dL (ref 0.2–1.2)
Total Protein: 7.1 g/dL (ref 6.0–8.3)

## 2024-05-07 LAB — CBC WITH DIFFERENTIAL/PLATELET
Basophils Absolute: 0 K/uL (ref 0.0–0.1)
Basophils Relative: 0.4 % (ref 0.0–3.0)
Eosinophils Absolute: 0.1 K/uL (ref 0.0–0.7)
Eosinophils Relative: 0.8 % (ref 0.0–5.0)
HCT: 46.5 % — ABNORMAL HIGH (ref 36.0–46.0)
Hemoglobin: 15 g/dL (ref 12.0–15.0)
Lymphocytes Relative: 13.7 % (ref 12.0–46.0)
Lymphs Abs: 1.2 K/uL (ref 0.7–4.0)
MCHC: 32.2 g/dL (ref 30.0–36.0)
MCV: 95.6 fl (ref 78.0–100.0)
Monocytes Absolute: 0.4 K/uL (ref 0.1–1.0)
Monocytes Relative: 4.2 % (ref 3.0–12.0)
Neutro Abs: 7 K/uL (ref 1.4–7.7)
Neutrophils Relative %: 80.9 % — ABNORMAL HIGH (ref 43.0–77.0)
Platelets: 275 K/uL (ref 150.0–400.0)
RBC: 4.86 Mil/uL (ref 3.87–5.11)
RDW: 14.3 % (ref 11.5–15.5)
WBC: 8.6 K/uL (ref 4.0–10.5)

## 2024-05-07 LAB — LIPID PANEL
Cholesterol: 204 mg/dL — ABNORMAL HIGH (ref 0–200)
HDL: 65.9 mg/dL (ref >39.00)
LDL Cholesterol: 113 mg/dL — ABNORMAL HIGH (ref 0–99)
NonHDL: 138.37
Total CHOL/HDL Ratio: 3
Triglycerides: 125 mg/dL (ref 0.0–149.0)
VLDL: 25 mg/dL (ref 0.0–40.0)

## 2024-05-07 LAB — TSH: TSH: 1.25 u[IU]/mL (ref 0.35–5.50)

## 2024-05-07 MED ORDER — HYOSCYAMINE SULFATE 0.125 MG SL SUBL
0.1250 mg | SUBLINGUAL_TABLET | Freq: Once | SUBLINGUAL | Status: AC
Start: 2024-05-07 — End: 2024-05-07
  Administered 2024-05-07: 0.125 mg via SUBLINGUAL

## 2024-05-07 MED ORDER — LIDOCAINE VISCOUS HCL 2 % MT SOLN
15.0000 mL | Freq: Once | OROMUCOSAL | Status: AC
Start: 2024-05-07 — End: 2024-05-07
  Administered 2024-05-07: 15 mL via OROMUCOSAL

## 2024-05-07 MED ORDER — PANTOPRAZOLE SODIUM 40 MG PO TBEC
40.0000 mg | DELAYED_RELEASE_TABLET | Freq: Every day | ORAL | 3 refills | Status: DC
Start: 2024-05-07 — End: 2024-05-08

## 2024-05-07 MED ORDER — ALUM & MAG HYDROXIDE-SIMETH 200-200-20 MG/5ML PO SUSP
30.0000 mL | Freq: Once | ORAL | Status: AC
Start: 2024-05-07 — End: 2024-05-07
  Administered 2024-05-07: 30 mL via ORAL

## 2024-05-07 NOTE — Progress Notes (Signed)
 Subjective:    Patient ID: Laura Hopkins, female    DOB: 1945/12/07, 78 y.o.   MRN: [id number]  Chief Complaint  Patient presents with   Abdominal Pain    Pt states having abdominal pain and nausa, unable to eat     HPI Patient is in today for c/o abd pain and f/u bp.  Discussed the use of AI scribe software for clinical note transcription with the patient, who gave verbal consent to proceed.  History of Present Illness Laura Hopkins is a 78 year old female with acid reflux who presents with stomach pain and nausea.  She has been experiencing a flare-up of stomach pain similar to a previous episode. The pain began on Sunday night and is located in the upper abdomen, making it difficult for her to get comfortable. She has been unable to eat much, with attempts to consume Jell-O and oatmeal resulting in vomiting. No diarrhea, but she notes having loose stools a couple of times this morning. During the review of symptoms, she confirms nausea and stomach pain, with the pain being more intense in certain areas of the abdomen. She has Zofran  at home from a previous urgent care visit for nausea.  She recalls a previous visit in May when she experienced high blood pressure and headaches, which led to the resumption of blood pressure medication. She experienced a headache yesterday and took an Imitrex  tablet, which alleviated the headache temporarily, but she feels it is returning today. She notes that her blood pressure was high over the weekend when the stomach pain started, but it has since improved. Her home blood pressure readings have varied, with recent measurements ranging from 110/71 to 136/86, and pulse rates from 62 to 84.  She mentions taking pantoprazole  tablets yesterday and today, which she does not take daily. She also reports waking up at night with severe foot cramps, which she attributes to taking a diuretic, leading to frequent urination.  She mentions a previous blood test  where everything was normal except for a slightly low GFR, which was not a concern. She has been off blood pressure medication since August 2023, but resumed it recently due to elevated readings.    Past Medical History:  Diagnosis Date   Acute pharyngitis 02/26/2009   Qualifier: Diagnosis of  By: Antonio ROSALEA Rockers     Arthritis    hands and neck    Asthma    Essential hypertension 02/07/2007   Qualifier: Diagnosis of  By: Antonio ROSALEA Rockers Britt)    HTN (hypertension)    Insomnia 12/23/2017   Lower extremity edema 07/31/2017   Migraine 05/23/2017   Pneumonia    SINUSITIS - ACUTE-NOS 03/17/2009   Qualifier: Diagnosis of  By: Antonio ROSALEA Rockers     Skin cancer    basal cell   SKIN CANCER, HX OF 01/08/2008   Annotation: BSC and SCC Qualifier: Diagnosis of  By: Antonio ROSALEA Rockers   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 Amy [place]    URI 02/07/2007   Qualifier: Diagnosis of  By: Antonio ROSALEA Rockers      Past Surgical History:  Procedure Laterality Date   ABDOMINAL HYSTERECTOMY  1995   AUGMENTATION MAMMAPLASTY N/A    Removed 05/25/2021   BREAST ENHANCEMENT SURGERY Bilateral    BREAST IMPLANT REMOVAL Bilateral    with breast lift   CATARACT EXTRACTION W/ INTRAOCULAR LENS IMPLANT Bilateral 03/03/2017   cataract sx  with lens implant   COLONOSCOPY  2007   colon--negative   NASAL SEPTUM SURGERY  1983   REDUCTION MAMMAPLASTY     breast lift, 05/25/2021   ROTATOR CUFF REPAIR Right 2000   TUBAL LIGATION  1978   WISDOM TOOTH EXTRACTION      Family History  Problem Relation Age of Onset   Hypertension Mother    Dementia Mother    Diabetes Father    Hypertension Father    Arthritis Father    Prostate cancer Father    Melanoma Brother    Colon cancer Neg Hx    Colon polyps Neg Hx    Esophageal cancer Neg Hx    Rectal cancer Neg Hx    Stomach cancer Neg Hx     Social History   Socioeconomic History   Marital status: Married    Spouse  name: Not on file   Number of children: 2   Years of education: Not on file   Highest education level: 12th grade  Occupational History   Occupation: Engineer, manufacturing systems: UNEMPLOYED    Comment: retired  Tobacco Use   Smoking status: Former    Current packs/day: 0.00    Average packs/day: 0.3 packs/day for 35.4 years (10.6 ttl pk-yrs)    Types: Cigarettes    Start date: 10/04/1963    Quit date: 03/07/1999    Years since quitting: 25.1   Smokeless tobacco: Never  Vaping Use   Vaping status: Never Used  Substance and Sexual Activity   Alcohol use: No    Alcohol/week: 0.0 standard drinks of alcohol   Drug use: No   Sexual activity: Never    Partners: Male  Other Topics Concern   Not on file  Social History Narrative   Completed high school and took some classes at [employer].   Social Drivers of Corporate investment banker Strain: Low Risk  (03/21/2024)   Overall Financial Resource Strain (CARDIA)    Difficulty of Paying Living Expenses: Not hard at all  Food Insecurity: No Food Insecurity (03/21/2024)   Hunger Vital Sign    Worried About Running Out of Food in the Last Year: Never true    Ran Out of Food in the Last Year: Never true  Transportation Needs: No Transportation Needs (03/21/2024)   PRAPARE - Administrator, Civil Service (Medical): No    Lack of Transportation (Non-Medical): No  Physical Activity: Sufficiently Active (03/21/2024)   Exercise Vital Sign    Days of Exercise per Week: 5 days    Minutes of Exercise per Session: 30 min  Stress: Stress Concern Present (03/21/2024)   [employer] of Occupational Health - Occupational Stress Questionnaire    Feeling of Stress: To some extent  Social Connections: Socially Integrated (03/21/2024)   Social Connection and Isolation Panel    Frequency of Communication with Friends and Family: More than three times a week    Frequency of Social Gatherings with Friends and Family: Once a week     Attends Religious Services: More than 4 times per year    Active Member of [employer] or Organizations: Yes    Attends Banker Meetings: 1 to 4 times per year    Marital Status: Married  Catering manager Violence: Not At Risk (02/21/2024)   Humiliation, Afraid, Rape, and Kick questionnaire    Fear of Current or Ex-Partner: No    Emotionally Abused: No    Physically Abused: No  Sexually Abused: No    Outpatient Medications Prior to Visit  Medication Sig Dispense Refill   acetaminophen  (TYLENOL ) 500 MG tablet Take 500 mg by mouth every 6 (six) hours as needed.     Erenumab -aooe (AIMOVIG ) 70 MG/ML SOAJ Inject 70 mg into the skin every 30 (thirty) days. 1 mL 2   estradiol  (VIVELLE -DOT) 0.05 MG/24HR patch Place 1 patch (0.05 mg total) onto the skin 2 (two) times a week. 8 patch 11   Eszopiclone  3 MG TABS Take 1 tablet (3 mg total) by mouth daily. 30 tablet 2   ondansetron  (ZOFRAN ) 4 MG tablet Take 1 tablet (4 mg total) by mouth every 8 (eight) hours as needed for nausea or vomiting. 20 tablet 0   promethazine  (PHENERGAN ) 25 MG tablet Take 1 tablet (25 mg total) by mouth as needed. 30 tablet 1   spironolactone  (ALDACTONE ) 25 MG tablet Take 1 tablet (25 mg total) by mouth daily. 90 tablet 3   SUMAtriptan  (IMITREX ) 100 MG tablet TAKE 1 TABLET (100 MG TOTAL) BY MOUTH AS NEEDED FOR MIGRAINE. MAY REPEAT IN 2 HOURS IF NEEDED 9 tablet 2   SYMBICORT  80-4.5 MCG/ACT inhaler INHALE 1 PUFF INTO THE LUNGS IN THE MORNING AND AT BEDTIME. 10.2 each 12   pantoprazole  (PROTONIX ) 20 MG tablet Take 1 tablet (20 mg total) by mouth daily. 90 tablet 0   No facility-administered medications prior to visit.    Allergies  Allergen Reactions   Avapro  [Irbesartan ] Cough   Codeine Other (See Comments)    Makes pt hyper    Metoprolol  Cough    Metallic taste , nasal drainage    Norvasc  [Amlodipine  Besylate] Swelling    Review of Systems  Constitutional:  Negative for chills, fever and malaise/fatigue.   HENT:  Negative for congestion.   Eyes:  Negative for blurred vision.  Respiratory:  Negative for cough and shortness of breath.   Cardiovascular:  Negative for chest pain, palpitations and leg swelling.  Gastrointestinal:  Positive for abdominal pain, heartburn, nausea and vomiting. Negative for blood in stool and diarrhea.  Musculoskeletal:  Negative for back pain.  Skin:  Negative for rash.  Neurological:  Negative for loss of consciousness and headaches.       Objective:    Physical Exam Vitals and nursing note reviewed.  Constitutional:      General: She is not in acute distress.    Appearance: Normal appearance. She is well-developed.  HENT:     Head: Normocephalic and atraumatic.  Eyes:     General: No scleral icterus.       Right eye: No discharge.        Left eye: No discharge.  Cardiovascular:     Rate and Rhythm: Normal rate and regular rhythm.     Heart sounds: No murmur heard. Pulmonary:     Effort: Pulmonary effort is normal. No respiratory distress.     Breath sounds: Normal breath sounds.  Abdominal:     General: Bowel sounds are normal. There is no distension.     Palpations: Abdomen is soft.     Tenderness: There is abdominal tenderness in the epigastric area. There is guarding. There is no rebound.  Musculoskeletal:        General: Normal range of motion.     Cervical back: Normal range of motion and neck supple.     Right lower leg: No edema.     Left lower leg: No edema.  Skin:    General: Skin  is warm and dry.  Neurological:     Mental Status: She is alert and oriented to person, place, and time.  Psychiatric:        Mood and Affect: Mood normal.        Behavior: Behavior normal.        Thought Content: Thought content normal.        Judgment: Judgment normal.     BP 120/88 (BP Location: Left Arm, Patient Position: Sitting, Cuff Size: Large)   Pulse 68   Temp 98 F (36.7 C) (Oral)   Resp 18   Ht 5' 4 (1.626 m)   Wt 152 lb 6.4 oz (69.1  kg)   SpO2 98%   BMI 26.16 kg/m  Wt Readings from Last 3 Encounters:  05/07/24 152 lb 6.4 oz (69.1 kg)  03/22/24 152 lb 12.8 oz (69.3 kg)  02/29/24 151 lb 3.2 oz (68.6 kg)    Diabetic Foot Exam - Simple   No data filed    Lab Results  Component Value Date   WBC 8.9 03/22/2024   HGB 14.6 03/22/2024   HCT 45.0 03/22/2024   PLT 258 03/22/2024   GLUCOSE 89 03/22/2024   CHOL 186 03/22/2024   TRIG 141 03/22/2024   HDL 69 03/22/2024   LDLCALC 93 03/22/2024   ALT 6 03/22/2024   AST 18 03/22/2024   NA 138 03/22/2024   K 4.8 03/22/2024   CL 100 03/22/2024   CREATININE 0.99 03/22/2024   BUN 12 03/22/2024   CO2 27 03/22/2024   TSH 1.72 03/22/2024   INR 1.0 05/05/2021   HGBA1C 5.8 04/30/2021    Lab Results  Component Value Date   TSH 1.72 03/22/2024   Lab Results  Component Value Date   WBC 8.9 03/22/2024   HGB 14.6 03/22/2024   HCT 45.0 03/22/2024   MCV 95.7 03/22/2024   PLT 258 03/22/2024   Lab Results  Component Value Date   NA 138 03/22/2024   K 4.8 03/22/2024   CO2 27 03/22/2024   GLUCOSE 89 03/22/2024   BUN 12 03/22/2024   CREATININE 0.99 03/22/2024   BILITOT 0.7 03/22/2024   ALKPHOS 72 01/09/2023   AST 18 03/22/2024   ALT 6 03/22/2024   PROT 6.8 03/22/2024   ALBUMIN 4.3 01/09/2023   CALCIUM 10.0 03/22/2024   ANIONGAP 9 09/01/2020   EGFR 59 (L) 03/22/2024   GFR 59.10 (L) 01/09/2023   Lab Results  Component Value Date   CHOL 186 03/22/2024   Lab Results  Component Value Date   HDL 69 03/22/2024   Lab Results  Component Value Date   LDLCALC 93 03/22/2024   Lab Results  Component Value Date   TRIG 141 03/22/2024   Lab Results  Component Value Date   CHOLHDL 2.7 03/22/2024   Lab Results  Component Value Date   HGBA1C 5.8 04/30/2021       Assessment & Plan:  Essential hypertension -     CBC with Differential/Platelet -     Comprehensive metabolic panel with GFR -     Lipid panel -     TSH  Other migraine without status  migrainosus, not intractable  Hyperlipidemia, unspecified hyperlipidemia type -     Comprehensive metabolic panel with GFR -     Lipid panel -     TSH  Dyspepsia -     CBC with Differential/Platelet -     Comprehensive metabolic panel with GFR -     Lipid  panel -     TSH -     Pantoprazole  Sodium; Take 1 tablet (40 mg total) by mouth daily.  Dispense: 30 tablet; Refill: 3 -     Ambulatory referral to Gastroenterology -     US  Abdomen Complete; Future -     Alum & Mag Hydroxide-Simeth -     Lidocaine  Viscous HCl -     Hyoscyamine  Sulfate  Nausea -     CBC with Differential/Platelet -     Comprehensive metabolic panel with GFR -     Lipid panel -     TSH -     Alum & Mag Hydroxide-Simeth -     Lidocaine  Viscous HCl -     Hyoscyamine  Sulfate  Epigastric pain -     US  Abdomen Complete; Future -     Alum & Mag Hydroxide-Simeth -     Lidocaine  Viscous HCl -     Hyoscyamine  Sulfate  Assessment and Plan Assessment & Plan Gastroesophageal reflux disease with acute exacerbation and associated nausea and vomiting   She experiences an acute exacerbation with stomach pain, inability to eat, and vomiting. The differential diagnosis includes worsening acid reflux or a possible stomach virus, though no other household members are affected. Increase pantoprazole  to 40 mg daily for 4-6 weeks. Order blood work and an abdominal ultrasound. Refer to gastroenterology for further evaluation. Advise taking Zofran  if nausea persists.  Headache   Intermittent headaches are managed with Imitrex . The recent headache resolved with Imitrex  use.  Foot cramps   Foot cramps occur at night, possibly related to diuretic use.  Hypertension   Blood pressure readings have been variable but are recently within an acceptable range.    Lota Leamer R Lowne Chase, DO

## 2024-05-08 ENCOUNTER — Other Ambulatory Visit: Payer: Self-pay | Admitting: Family Medicine

## 2024-05-08 ENCOUNTER — Ambulatory Visit: Payer: Self-pay | Admitting: Family Medicine

## 2024-05-08 DIAGNOSIS — R1013 Epigastric pain: Secondary | ICD-10-CM

## 2024-05-09 ENCOUNTER — Ambulatory Visit (INDEPENDENT_AMBULATORY_CARE_PROVIDER_SITE_OTHER): Admitting: Medical

## 2024-05-09 ENCOUNTER — Encounter: Payer: Self-pay | Admitting: Medical

## 2024-05-09 ENCOUNTER — Ambulatory Visit: Payer: Self-pay

## 2024-05-09 VITALS — BP 136/78 | HR 64 | Temp 97.5°F | Ht 64.0 in | Wt 152.8 lb

## 2024-05-09 DIAGNOSIS — G43809 Other migraine, not intractable, without status migrainosus: Secondary | ICD-10-CM

## 2024-05-09 DIAGNOSIS — K219 Gastro-esophageal reflux disease without esophagitis: Secondary | ICD-10-CM | POA: Diagnosis not present

## 2024-05-09 DIAGNOSIS — I10 Essential (primary) hypertension: Secondary | ICD-10-CM | POA: Diagnosis not present

## 2024-05-09 MED ORDER — KETOROLAC TROMETHAMINE 60 MG/2ML IM SOLN
60.0000 mg | Freq: Once | INTRAMUSCULAR | Status: AC
  Administered 2024-05-09: 60 mg via INTRAMUSCULAR

## 2024-05-09 NOTE — Progress Notes (Unsigned)
 Subjective:    Patient ID: Laura Hopkins, female    DOB: Mar 03, 1946, 78 y.o.   MRN: [id number]  HPI  Laura Hopkins is a 78 year old female with migraines who presents with increased frequency of headaches.  She has been experiencing 8 to 10 migraines per month over the past year. Aimovig  injections, started in January or February, are taken monthly and have reduced the frequency of her migraines but not eliminated them. She also uses sumatriptan  tablets as needed for acute migraine attacks. Her migraines are typically left-sided and associated with nausea, phonophobia, and photophobia. Headaches worsen in the summer, potentially due to changes in barometric pressure.  She has had a persistent headache since Monday, which has not resolved despite treatment. Sumatriptan  taken on Monday initially helped, but the headache returned on Tuesday. She was unable to take medication early on Tuesday, leading to worsening symptoms. Sumatriptan  was taken again on Tuesday and Wednesday, and she woke up with a headache on Thursday. Toradol  injections have been effective for severe headaches in the past.  She has a history of side effects from various antihypertensive medications, including spironolactone , which caused acid reflux and cramps. She was off blood pressure medication for seven days, leading to a spike in blood pressure, and resumed spironolactone  in May. She is currently taking pantoprazole  40 mg for acid reflux.  She reports nausea, phonophobia, and photophobia associated with her migraines.       Review of Systems  Constitutional:  Negative for chills, fatigue and fever.  Respiratory:  Negative for cough, chest tightness and wheezing.   Cardiovascular:  Negative for chest pain and palpitations.  Gastrointestinal:  Positive for abdominal pain.       Improved since starting protonix   Genitourinary:  Negative for dysuria.  Musculoskeletal:  Negative for back pain.  Skin:  Negative for  rash.  Neurological:  Positive for headaches. Negative for dizziness, seizures, syncope, speech difficulty, weakness and numbness.  Hematological:  Positive for adenopathy. Does not bruise/bleed easily.  Psychiatric/Behavioral:  Positive for behavioral problems. Negative for confusion. The patient is nervous/anxious.     Past Medical History:  Diagnosis Date   Acute pharyngitis 02/26/2009   Qualifier: Diagnosis of  By: Antonio ROSALEA Rockers     Arthritis    hands and neck    Asthma    Essential hypertension 02/07/2007   Qualifier: Diagnosis of  By: Antonio ROSALEA Rockers Britt)    HTN (hypertension)    Insomnia 12/23/2017   Lower extremity edema 07/31/2017   Migraine 05/23/2017   Pneumonia    SINUSITIS - ACUTE-NOS 03/17/2009   Qualifier: Diagnosis of  By: Antonio ROSALEA Rockers     Skin cancer    basal cell   SKIN CANCER, HX OF 01/08/2008   Annotation: BSC and SCC Qualifier: Diagnosis of  By: Antonio ROSALEA Rockers   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 Amy [place]    URI 02/07/2007   Qualifier: Diagnosis of  By: Antonio ROSALEA Rockers       Social History   Socioeconomic History   Marital status: Married    Spouse name: Not on file   Number of children: 2   Years of education: Not on file   Highest education level: 12th grade  Occupational History   Occupation: Engineer, manufacturing systems: UNEMPLOYED    Comment: retired  Tobacco Use   Smoking status: Former    Current packs/day: 0.00  Average packs/day: 0.3 packs/day for 35.4 years (10.6 ttl pk-yrs)    Types: Cigarettes    Start date: 10/04/1963    Quit date: 03/07/1999    Years since quitting: 25.1   Smokeless tobacco: Never  Vaping Use   Vaping status: Never Used  Substance and Sexual Activity   Alcohol use: No    Alcohol/week: 0.0 standard drinks of alcohol   Drug use: No   Sexual activity: Never    Partners: Male  Other Topics Concern   Not on file  Social History Narrative   Completed high  school and took some classes at [employer].   Social Drivers of Corporate investment banker Strain: Low Risk  (03/21/2024)   Overall Financial Resource Strain (CARDIA)    Difficulty of Paying Living Expenses: Not hard at all  Food Insecurity: No Food Insecurity (03/21/2024)   Hunger Vital Sign    Worried About Running Out of Food in the Last Year: Never true    Ran Out of Food in the Last Year: Never true  Transportation Needs: No Transportation Needs (03/21/2024)   PRAPARE - Administrator, Civil Service (Medical): No    Lack of Transportation (Non-Medical): No  Physical Activity: Sufficiently Active (03/21/2024)   Exercise Vital Sign    Days of Exercise per Week: 5 days    Minutes of Exercise per Session: 30 min  Stress: Stress Concern Present (03/21/2024)   [employer] of Occupational Health - Occupational Stress Questionnaire    Feeling of Stress: To some extent  Social Connections: Socially Integrated (03/21/2024)   Social Connection and Isolation Panel    Frequency of Communication with Friends and Family: More than three times a week    Frequency of Social Gatherings with Friends and Family: Once a week    Attends Religious Services: More than 4 times per year    Active Member of [employer] or Organizations: Yes    Attends Banker Meetings: 1 to 4 times per year    Marital Status: Married  Catering manager Violence: Not At Risk (02/21/2024)   Humiliation, Afraid, Rape, and Kick questionnaire    Fear of Current or Ex-Partner: No    Emotionally Abused: No    Physically Abused: No    Sexually Abused: No    Past Surgical History:  Procedure Laterality Date   ABDOMINAL HYSTERECTOMY  1995   AUGMENTATION MAMMAPLASTY N/A    Removed 05/25/2021   BREAST ENHANCEMENT SURGERY Bilateral    BREAST IMPLANT REMOVAL Bilateral    with breast lift   CATARACT EXTRACTION W/ INTRAOCULAR LENS IMPLANT Bilateral 03/03/2017   cataract sx with lens implant    COLONOSCOPY  2007   colon--negative   NASAL SEPTUM SURGERY  1983   REDUCTION MAMMAPLASTY     breast lift, 05/25/2021   ROTATOR CUFF REPAIR Right 2000   TUBAL LIGATION  1978   WISDOM TOOTH EXTRACTION      Family History  Problem Relation Age of Onset   Hypertension Mother    Dementia Mother    Diabetes Father    Hypertension Father    Arthritis Father    Prostate cancer Father    Melanoma Brother    Colon cancer Neg Hx    Colon polyps Neg Hx    Esophageal cancer Neg Hx    Rectal cancer Neg Hx    Stomach cancer Neg Hx     Allergies  Allergen Reactions   Avapro  [Irbesartan ] Cough  Codeine Other (See Comments)    Makes pt hyper    Metoprolol  Cough    Metallic taste , nasal drainage    Norvasc  [Amlodipine  Besylate] Swelling    Current Outpatient Medications on File Prior to Visit  Medication Sig Dispense Refill   acetaminophen  (TYLENOL ) 500 MG tablet Take 500 mg by mouth every 6 (six) hours as needed.     Erenumab -aooe (AIMOVIG ) 70 MG/ML SOAJ Inject 70 mg into the skin every 30 (thirty) days. 1 mL 2   estradiol  (VIVELLE -DOT) 0.05 MG/24HR patch Place 1 patch (0.05 mg total) onto the skin 2 (two) times a week. 8 patch 11   Eszopiclone  3 MG TABS Take 1 tablet (3 mg total) by mouth daily. 30 tablet 2   ondansetron  (ZOFRAN ) 4 MG tablet Take 1 tablet (4 mg total) by mouth every 8 (eight) hours as needed for nausea or vomiting. 20 tablet 0   pantoprazole  (PROTONIX ) 40 MG tablet TAKE 1 TABLET BY MOUTH EVERY DAY 90 tablet 1   promethazine  (PHENERGAN ) 25 MG tablet Take 1 tablet (25 mg total) by mouth as needed. 30 tablet 1   SUMAtriptan  (IMITREX ) 100 MG tablet TAKE 1 TABLET (100 MG TOTAL) BY MOUTH AS NEEDED FOR MIGRAINE. MAY REPEAT IN 2 HOURS IF NEEDED 9 tablet 2   SYMBICORT  80-4.5 MCG/ACT inhaler INHALE 1 PUFF INTO THE LUNGS IN THE MORNING AND AT BEDTIME. 10.2 each 12   spironolactone  (ALDACTONE ) 25 MG tablet Take 1 tablet (25 mg total) by mouth daily. (Patient not taking: Reported  on 05/09/2024) 90 tablet 3   No current facility-administered medications on file prior to visit.    BP 136/78   Pulse 64   Temp (!) 97.5 F (36.4 C) (Oral)   Ht 5' 4 (1.626 m)   Wt 152 lb 12.8 oz (69.3 kg)   SpO2 97%   BMI 26.23 kg/m        Objective:   Physical Exam   General Mental Status- Alert. General Appearance- Not in acute distress.   Skin General: Color- Normal Color. Moisture- Normal Moisture.  Neck Carotid Arteries- Normal color. Moisture- Normal Moisture. No carotid bruits. No JVD.  Chest and Lung Exam Auscultation: Breath Sounds:-Normal.  Cardiovascular Auscultation:Rythm- Regular. Murmurs & Other Heart Sounds:Auscultation of the heart reveals- No Murmurs.  Abdomen Inspection:-Inspeection Normal. Palpation/Percussion:Note:No mass. Palpation and Percussion of the abdomen reveal- Non Tender, Non Distended + BS, no rebound or guarding.   Neurologic Cranial Nerve exam:- CN III-XII intact(No nystagmus), symmetric smile. Drift Test:- No drift. Romberg Exam:- Negative.  Heal to Toe Gait exam:-Normal. Finger to Nose:- Normal/Intact Strength:- 5/5 equal and symmetric strength both upper and lower extremities.       Assessment & Plan:   Patient Instructions  Migraine with frequent attacks Chronic migraines with increased frequency, 8-10 per month. Current episode partially responsive to sumatriptan . Headaches left-sided with nausea, photophobia, phonophobia. Severity 7/10. BP 122/90, safe for Toradol . -normal neurologic exam - Administer Toradol  60 mg injection. - Continue sumatriptan  and Aimovig . - Refer to Dr. Ines, neurologist. - Send note to Dr. Antonio regarding referral and inquire about Aimovig  dose adjustment. -If motor or sensory deficits with ha be seen in ED.  Hypertension Slightly elevated. Various side effects with different classes. Recent spironolactone  stopped. - Monitor BP daily, report in one week. - Send readings to both Dr.  Antonio and myself.   Gastroesophageal reflux disease (GERD) GERD symptoms managed with pantoprazole . Previous spironolactone  exacerbated reflux. - Continue pantoprazole  40 mg daily.  Follow up 2-3 weeks with pcp(or myself if no availability) Sooner if needed as well

## 2024-05-09 NOTE — Telephone Encounter (Signed)
 FYI Only or Action Required?: FYI only for provider.  Patient was last seen in primary care on 05/07/2024 by Antonio Meth, Jamee SAUNDERS, DO.  Called Nurse Triage reporting Headache.  Symptoms began 4 days ago.  Interventions attempted: Prescription medications: Imitrex  x 3, Rest, hydration, or home remedies, and Ice/heat application.  Symptoms are: unchanged.  Triage Disposition: See HCP Within 4 Hours (Or PCP Triage)  Patient/caregiver understands and will follow disposition?: Yes           Copied from CRM #[id number]. Topic: Clinical - Red Word Triage >> May 09, 2024  8:22 AM Suzen RAMAN wrote: Red Word that prompted transfer to Nurse Triage: Headache for 3 days. Inquiry about receiving a shot for the pain Reason for Disposition  [1] SEVERE headache (e.g., excruciating) AND [2] not improved after 2 hours of pain medicine  Answer Assessment - Initial Assessment Questions 1. LOCATION: Where does it hurt?      Left side 2. ONSET: When did the headache start? (e.g., minutes, hours, days)      X 4 days 3. PATTERN: Does the pain come and go, or has it been constant since it started?     Constant 4. SEVERITY: How bad is the pain? and What does it keep you from doing?  (e.g., Scale 1-10; mild, moderate, or severe)     9/10 5. RECURRENT SYMPTOM: Have you ever had headaches before? If Yes, ask: When was the last time? and What happened that time?      Yes, pt has taken Imitrex  M-T-W with minimal improvement 6. CAUSE: What do you think is causing the headache?     History of Migraines 7. MIGRAINE: Have you been diagnosed with migraine headaches? If Yes, ask: Is this headache similar?      Yes 8. HEAD INJURY: Has there been any recent injury to your head?      None 9. OTHER SYMPTOMS: Do you have any other symptoms? (e.g., fever, stiff neck, eye pain, sore throat, cold symptoms)     Left side behind eye, nose, ear and down neck  Protocols used: Headache-A-AH

## 2024-05-09 NOTE — Telephone Encounter (Signed)
 Patient scheduled to come in and see [place] today

## 2024-05-09 NOTE — Patient Instructions (Signed)
 Migraine with frequent attacks Chronic migraines with increased frequency, 8-10 per month. Current episode partially responsive to sumatriptan . Headaches left-sided with nausea, photophobia, phonophobia. Severity 7/10. BP 122/90, safe for Toradol . -normal neurologic exam - Administer Toradol  60 mg injection. - Continue sumatriptan  and Aimovig . - Refer to Dr. Ines, neurologist. - Send note to Dr. Antonio regarding referral and inquire about Aimovig  dose adjustment. -If motor or sensory deficits with ha be seen in ED.  Hypertension Slightly elevated. Various side effects with different classes. Recent spironolactone  stopped. - Monitor BP daily, report in one week. - Send readings to both Dr. Antonio and myself.   Gastroesophageal reflux disease (GERD) GERD symptoms managed with pantoprazole . Previous spironolactone  exacerbated reflux. - Continue pantoprazole  40 mg daily.  Follow up 2-3 weeks with pcp(or myself if no availability) Sooner if needed as well

## 2024-05-12 NOTE — Addendum Note (Signed)
 Addended by: ANTONIO CYNDEE ROCKERS R on: 05/12/2024 12:32 PM   Modules accepted: Orders

## 2024-05-14 ENCOUNTER — Encounter: Payer: Self-pay | Admitting: Family Medicine

## 2024-05-14 ENCOUNTER — Encounter: Payer: Self-pay | Admitting: Gastroenterology

## 2024-05-14 ENCOUNTER — Telehealth: Payer: Self-pay | Admitting: Neurology

## 2024-05-14 NOTE — Telephone Encounter (Signed)
 Patient being referred to us  by PCP for migraines. Previously seen Dr. Chalice and Amy here for sleep issues last seen 2021. Ok for switch to see Dr. Ines? Thank you!

## 2024-05-15 ENCOUNTER — Other Ambulatory Visit (INDEPENDENT_AMBULATORY_CARE_PROVIDER_SITE_OTHER)

## 2024-05-15 LAB — COMPREHENSIVE METABOLIC PANEL WITH GFR
ALT: 5 U/L (ref 0–35)
AST: 16 U/L (ref 0–37)
Albumin: 4.2 g/dL (ref 3.5–5.2)
Alkaline Phosphatase: 71 U/L (ref 39–117)
BUN: 12 mg/dL (ref 6–23)
CO2: 31 meq/L (ref 19–32)
Calcium: 9.3 mg/dL (ref 8.4–10.5)
Chloride: 103 meq/L (ref 96–112)
Creatinine, Ser: 0.99 mg/dL (ref 0.40–1.20)
GFR: 55.72 mL/min — ABNORMAL LOW (ref >60.00)
Glucose, Bld: 86 mg/dL (ref 70–99)
Potassium: 4.7 meq/L (ref 3.5–5.1)
Sodium: 140 meq/L (ref 135–145)
Total Bilirubin: 0.8 mg/dL (ref 0.2–1.2)
Total Protein: 6.6 g/dL (ref 6.0–8.3)

## 2024-05-15 NOTE — Telephone Encounter (Signed)
 That is fine thank you .

## 2024-05-20 LAB — PTH, INTACT AND CALCIUM: PTH: 57 pg/mL (ref 16–77)

## 2024-05-21 ENCOUNTER — Other Ambulatory Visit

## 2024-05-22 ENCOUNTER — Ambulatory Visit: Payer: Self-pay | Admitting: Family Medicine

## 2024-05-22 LAB — PTH, INTACT AND CALCIUM
Calcium: 9.9 mg/dL (ref 8.6–10.4)
PTH: 42 pg/mL (ref 16–77)

## 2024-05-24 ENCOUNTER — Ambulatory Visit: Payer: Self-pay | Admitting: Family Medicine

## 2024-05-28 ENCOUNTER — Telehealth (INDEPENDENT_AMBULATORY_CARE_PROVIDER_SITE_OTHER): Admitting: Family Medicine

## 2024-05-28 DIAGNOSIS — K219 Gastro-esophageal reflux disease without esophagitis: Secondary | ICD-10-CM

## 2024-05-28 DIAGNOSIS — R1013 Epigastric pain: Secondary | ICD-10-CM | POA: Diagnosis not present

## 2024-05-28 MED ORDER — LANSOPRAZOLE 30 MG PO CPDR: DELAYED_RELEASE_CAPSULE | ORAL | 3 refills | Status: DC

## 2024-05-28 MED ORDER — FAMOTIDINE 20 MG PO TABS: 20.0000 mg | ORAL_TABLET | Freq: Every day | ORAL | 2 refills | Status: DC

## 2024-05-28 MED ORDER — SUCRALFATE 1 G PO TABS: 1.0000 g | ORAL_TABLET | Freq: Three times a day (TID) | ORAL | 0 refills | Status: DC

## 2024-05-28 NOTE — Progress Notes (Signed)
 MyChart Video Visit    Virtual Visit via Video Note   This patient is at least at moderate risk for complications without adequate follow up. This format is felt to be most appropriate for this patient at this time. Physical exam was limited by quality of the video and audio technology used for the visit. heather was able to get the patient set up on a video visit.  Patient location: home Patient and provider in visit Provider location: Office  I discussed the limitations of evaluation and management by telemedicine and the availability of in person appointments. The patient expressed understanding and agreed to proceed.  Visit Date: 05/28/2024  Today's healthcare provider: Jamee JONELLE Antonio Cyndee, DO     Subjective:    Patient ID: Laura Hopkins, female    DOB: 1946/09/24, 78 y.o.   MRN: [id number]  Chief Complaint  Patient presents with   Abdominal Pain    unchanged    HPI Patient is in today for con't epigastric pain.   Discussed the use of AI scribe software for clinical note transcription with the patient, who gave verbal consent to proceed.  History of Present Illness Laura Hopkins is a 78 year old female who presents with persistent abdominal pain and bloating.  She experiences ongoing stomach issues characterized by pain and bloating, particularly in the mornings. The pain is located in the upper abdomen, right under her breast, and is described as constant discomfort. Eating exacerbates the bloating, making her feel as if her stomach would 'explode'.  She has been taking pantoprazole , initially at 20 mg, which was increased to 40 mg, but she reports no relief from her symptoms. She takes the medication in the morning, 30 minutes before eating. In the past, she was switched from pantoprazole  to other medications by a gastroenterologist, which eventually improved her symptoms.  An ultrasound was performed previously, which included the gallbladder, and showed no  gallstones. She recalls a past diagnosis of fatty liver from a gastroenterologist visit in 2022. In the past, she underwent a HIDA scan and endoscopy, which were normal.  She experiences increased burping and gas, especially in the morning. No sensation of food getting stuck in the throat.    Past Medical History:  Diagnosis Date   Acute pharyngitis 02/26/2009   Qualifier: Diagnosis of  By: Antonio ROSALEA Jamee     Arthritis    hands and neck    Asthma    Essential hypertension 02/07/2007   Qualifier: Diagnosis of  By: Antonio ROSALEA Jamee Britt)    HTN (hypertension)    Insomnia 12/23/2017   Lower extremity edema 07/31/2017   Migraine 05/23/2017   Pneumonia    SINUSITIS - ACUTE-NOS 03/17/2009   Qualifier: Diagnosis of  By: Antonio ROSALEA Jamee     Skin cancer    basal cell   SKIN CANCER, HX OF 01/08/2008   Annotation: BSC and SCC Qualifier: Diagnosis of  By: Antonio ROSALEA Jamee   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 Amy [place]    URI 02/07/2007   Qualifier: Diagnosis of  By: Antonio ROSALEA Jamee      Past Surgical History:  Procedure Laterality Date   ABDOMINAL HYSTERECTOMY  1995   AUGMENTATION MAMMAPLASTY N/A    Removed 05/25/2021   BREAST ENHANCEMENT SURGERY Bilateral    BREAST IMPLANT REMOVAL Bilateral    with breast lift   CATARACT EXTRACTION W/ INTRAOCULAR LENS IMPLANT Bilateral 03/03/2017  cataract sx with lens implant   COLONOSCOPY  2007   colon--negative   NASAL SEPTUM SURGERY  1983   REDUCTION MAMMAPLASTY     breast lift, 05/25/2021   ROTATOR CUFF REPAIR Right 2000   TUBAL LIGATION  1978   WISDOM TOOTH EXTRACTION      Family History  Problem Relation Age of Onset   Hypertension Mother    Dementia Mother    Diabetes Father    Hypertension Father    Arthritis Father    Prostate cancer Father    Melanoma Brother    Colon cancer Neg Hx    Colon polyps Neg Hx    Esophageal cancer Neg Hx    Rectal cancer Neg Hx    Stomach cancer  Neg Hx     Social History   Socioeconomic History   Marital status: Married    Spouse name: Not on file   Number of children: 2   Years of education: Not on file   Highest education level: 12th grade  Occupational History   Occupation: Engineer, manufacturing systems: UNEMPLOYED    Comment: retired  Tobacco Use   Smoking status: Former    Current packs/day: 0.00    Average packs/day: 0.3 packs/day for 35.4 years (10.6 ttl pk-yrs)    Types: Cigarettes    Start date: 10/04/1963    Quit date: 03/07/1999    Years since quitting: 25.2   Smokeless tobacco: Never  Vaping Use   Vaping status: Never Used  Substance and Sexual Activity   Alcohol use: No    Alcohol/week: 0.0 standard drinks of alcohol   Drug use: No   Sexual activity: Never    Partners: Male  Other Topics Concern   Not on file  Social History Narrative   Completed high school and took some classes at [employer].   Social Drivers of Corporate investment banker Strain: Low Risk  (03/21/2024)   Overall Financial Resource Strain (CARDIA)    Difficulty of Paying Living Expenses: Not hard at all  Food Insecurity: No Food Insecurity (03/21/2024)   Hunger Vital Sign    Worried About Running Out of Food in the Last Year: Never true    Ran Out of Food in the Last Year: Never true  Transportation Needs: No Transportation Needs (03/21/2024)   PRAPARE - Administrator, Civil Service (Medical): No    Lack of Transportation (Non-Medical): No  Physical Activity: Sufficiently Active (03/21/2024)   Exercise Vital Sign    Days of Exercise per Week: 5 days    Minutes of Exercise per Session: 30 min  Stress: Stress Concern Present (03/21/2024)   [employer] of Occupational Health - Occupational Stress Questionnaire    Feeling of Stress: To some extent  Social Connections: Socially Integrated (03/21/2024)   Social Connection and Isolation Panel    Frequency of Communication with Friends and Family: More than three  times a week    Frequency of Social Gatherings with Friends and Family: Once a week    Attends Religious Services: More than 4 times per year    Active Member of [employer] or Organizations: Yes    Attends Banker Meetings: 1 to 4 times per year    Marital Status: Married  Catering manager Violence: Not At Risk (02/21/2024)   Humiliation, Afraid, Rape, and Kick questionnaire    Fear of Current or Ex-Partner: No    Emotionally Abused: No    Physically Abused:  No    Sexually Abused: No    Outpatient Medications Prior to Visit  Medication Sig Dispense Refill   acetaminophen  (TYLENOL ) 500 MG tablet Take 500 mg by mouth every 6 (six) hours as needed.     Erenumab -aooe (AIMOVIG ) 70 MG/ML SOAJ Inject 70 mg into the skin every 30 (thirty) days. 1 mL 2   estradiol  (VIVELLE -DOT) 0.05 MG/24HR patch Place 1 patch (0.05 mg total) onto the skin 2 (two) times a week. 8 patch 11   Eszopiclone  3 MG TABS Take 1 tablet (3 mg total) by mouth daily. 30 tablet 2   ondansetron  (ZOFRAN ) 4 MG tablet Take 1 tablet (4 mg total) by mouth every 8 (eight) hours as needed for nausea or vomiting. 20 tablet 0   promethazine  (PHENERGAN ) 25 MG tablet Take 1 tablet (25 mg total) by mouth as needed. 30 tablet 1   spironolactone  (ALDACTONE ) 25 MG tablet Take 1 tablet (25 mg total) by mouth daily. (Patient not taking: Reported on 05/09/2024) 90 tablet 3   SUMAtriptan  (IMITREX ) 100 MG tablet TAKE 1 TABLET (100 MG TOTAL) BY MOUTH AS NEEDED FOR MIGRAINE. MAY REPEAT IN 2 HOURS IF NEEDED 9 tablet 2   SYMBICORT  80-4.5 MCG/ACT inhaler INHALE 1 PUFF INTO THE LUNGS IN THE MORNING AND AT BEDTIME. 10.2 each 12   pantoprazole  (PROTONIX ) 40 MG tablet TAKE 1 TABLET BY MOUTH EVERY DAY 90 tablet 1   No facility-administered medications prior to visit.    Allergies  Allergen Reactions   Avapro  [Irbesartan ] Cough   Codeine Other (See Comments)    Makes pt hyper    Metoprolol  Cough    Metallic taste , nasal drainage    Norvasc   [Amlodipine  Besylate] Swelling    Review of Systems  Constitutional:  Negative for fever and malaise/fatigue.  HENT:  Negative for congestion.   Eyes:  Negative for blurred vision.  Respiratory:  Negative for cough and shortness of breath.   Cardiovascular:  Negative for chest pain, palpitations and leg swelling.  Gastrointestinal:  Positive for abdominal pain and heartburn. Negative for vomiting.  Musculoskeletal:  Negative for back pain.  Skin:  Negative for rash.  Neurological:  Negative for loss of consciousness and headaches.       Objective:    Physical Exam Vitals and nursing note reviewed.  Constitutional:      Appearance: Normal appearance. She is well-developed.  Eyes:     General:        Right eye: No discharge.        Left eye: No discharge.  Musculoskeletal:        General: Normal range of motion.     Cervical back: Normal range of motion and neck supple.     Right lower leg: No edema.     Left lower leg: No edema.  Neurological:     Mental Status: She is alert and oriented to person, place, and time.  Psychiatric:        Mood and Affect: Mood normal.        Behavior: Behavior normal.        Thought Content: Thought content normal.        Judgment: Judgment normal.     There were no vitals taken for this visit. Wt Readings from Last 3 Encounters:  05/09/24 152 lb 12.8 oz (69.3 kg)  05/07/24 152 lb 6.4 oz (69.1 kg)  03/22/24 152 lb 12.8 oz (69.3 kg)       Assessment & Plan:  Gastroesophageal reflux disease without esophagitis -     Sucralfate ; Take 1 tablet (1 g total) by mouth 4 (four) times daily -  with meals and at bedtime.  Dispense: 120 tablet; Refill: 0 -     Lansoprazole ; 1 po q am  Dispense: 30 capsule; Refill: 3 -     Famotidine ; Take 1 tablet (20 mg total) by mouth at bedtime.  Dispense: 30 tablet; Refill: 2  Epigastric pain -     CT ABDOMEN PELVIS W CONTRAST; Future    Assessment and Plan Assessment & Plan Chronic upper abdominal  pain and bloating   Chronic upper abdominal pain and bloating persist in the mid-epigastric region despite increased pantoprazole  dosage. Symptoms include bloating, burping, and postprandial discomfort. Previous ultrasound showed no gallstones, and both HIDA scan and endoscopy were normal. Atypical gallbladder issues remain a differential diagnosis. Fatty liver was noted previously but is unlikely symptomatic unless progressed to cirrhosis. Current pantoprazole  treatment is ineffective, necessitating a medication change. Discontinue pantoprazole . Prescribe Prevacid  30 mg in the morning and Pepcid  20 mg at night. Hold Carafate  initially; if no relief after 3-4 days with Prevacid  and Pepcid , add Carafate . Referral coordinator to expedite GI appointment. Advise emergency room visit if symptoms significantly worsen.   I discussed the assessment and treatment plan with the patient. The patient was provided an opportunity to ask questions and all were answered. The patient agreed with the plan and demonstrated an understanding of the instructions.   The patient was advised to call back or seek an in-person evaluation if the symptoms worsen or if the condition fails to improve as anticipated.  Jamee JONELLE Antonio Cyndee, DO [facility] [phone] (phone) [phone] (fax)  [facility]

## 2024-05-30 ENCOUNTER — Ambulatory Visit: Admitting: Family Medicine

## 2024-05-31 ENCOUNTER — Ambulatory Visit (HOSPITAL_BASED_OUTPATIENT_CLINIC_OR_DEPARTMENT_OTHER)
Admission: RE | Admit: 2024-05-31 | Discharge: 2024-05-31 | Disposition: A | Discharge status: Home / Self Care | Source: Ambulatory Visit | Attending: Family Medicine | Admitting: Family Medicine

## 2024-05-31 DIAGNOSIS — K5730 Diverticulosis of large intestine without perforation or abscess without bleeding: Secondary | ICD-10-CM | POA: Diagnosis not present

## 2024-05-31 DIAGNOSIS — Z90710 Acquired absence of both cervix and uterus: Secondary | ICD-10-CM | POA: Diagnosis not present

## 2024-05-31 DIAGNOSIS — R1013 Epigastric pain: Secondary | ICD-10-CM | POA: Diagnosis not present

## 2024-05-31 DIAGNOSIS — R109 Unspecified abdominal pain: Secondary | ICD-10-CM | POA: Diagnosis not present

## 2024-05-31 MED ORDER — BARIUM SULFATE 2 % PO SUSP
450.0000 mL | Freq: Once | ORAL | Status: AC
  Administered 2024-05-31: 450 mL via ORAL

## 2024-05-31 MED ORDER — IOHEXOL 300 MG/ML  SOLN
100.0000 mL | Freq: Once | INTRAMUSCULAR | Status: AC | PRN
  Administered 2024-05-31: 100 mL via INTRAVENOUS

## 2024-06-02 ENCOUNTER — Ambulatory Visit: Payer: Self-pay | Admitting: Family Medicine

## 2024-06-06 ENCOUNTER — Other Ambulatory Visit: Payer: Self-pay | Admitting: Family Medicine

## 2024-06-06 DIAGNOSIS — R1013 Epigastric pain: Secondary | ICD-10-CM

## 2024-06-06 DIAGNOSIS — K219 Gastro-esophageal reflux disease without esophagitis: Secondary | ICD-10-CM

## 2024-06-06 NOTE — Addendum Note (Signed)
 Addended by: ANTONIO CYNDEE ROCKERS R on: 06/06/2024 01:07 PM   Modules accepted: Orders

## 2024-06-27 ENCOUNTER — Telehealth: Payer: Self-pay | Admitting: Family Medicine

## 2024-06-27 NOTE — Telephone Encounter (Signed)
 Copied from CRM #[id number]. Topic: Medicare AWV >> Jun 27, 2024  8:46 AM Nathanel DEL wrote: Reason for CRM: Called LVM 06/27/2024 to schedule AWV. Please schedule Virtual or Telehealth visits ONLY.   Nathanel Paschal; Care Guide Ambulatory Clinical Support [place] [facility] Direct Dial: [phone]

## 2024-07-03 ENCOUNTER — Other Ambulatory Visit: Payer: Self-pay | Admitting: Family Medicine

## 2024-07-03 DIAGNOSIS — G43809 Other migraine, not intractable, without status migrainosus: Secondary | ICD-10-CM

## 2024-07-05 ENCOUNTER — Encounter: Payer: Self-pay | Admitting: Gastroenterology

## 2024-07-05 ENCOUNTER — Ambulatory Visit: Admitting: Gastroenterology

## 2024-07-05 VITALS — BP 134/84 | HR 65 | Ht 64.0 in | Wt 154.5 lb

## 2024-07-05 DIAGNOSIS — K219 Gastro-esophageal reflux disease without esophagitis: Secondary | ICD-10-CM

## 2024-07-05 DIAGNOSIS — R110 Nausea: Secondary | ICD-10-CM | POA: Diagnosis not present

## 2024-07-05 DIAGNOSIS — K5900 Constipation, unspecified: Secondary | ICD-10-CM

## 2024-07-05 MED ORDER — FAMOTIDINE 20 MG PO TABS: 20.0000 mg | ORAL_TABLET | Freq: Every day | ORAL | 2 refills | Status: DC

## 2024-07-05 MED ORDER — LANSOPRAZOLE 30 MG PO CPDR: 30.0000 mg | DELAYED_RELEASE_CAPSULE | Freq: Every evening | ORAL | 3 refills | Status: DC

## 2024-07-05 NOTE — Progress Notes (Signed)
 07/05/2024 Laura Hopkins [id number] Oct 16, 1945   HISTORY OF PRESENT ILLNESS:  This is a 78 year old female who is a patient of Dr. Trenna.  She is here today with complaints of some epigastric abdominal pain with reflux and nausea.  She reports similar symptoms in 2022/2023 when she was seen here in the past.  She had come off of PPI therapy/acid reflux medications.  When the symptoms started back again in May she was started back on lansoprazole  30 mg daily and famotidine  20 mg at bedtime.  Symptoms have improved somewhat on lansoprazole  30 mg daily in the morning and famotidine  20 mg at bedtime.  Now mostly complaining of nausea upon waking up in the mornings about 3 to 4 days a week.  She asks if it could be related to nerves.  She has a difficult time sleeping at night and currently uses Lunesta .  She does have migraines, but they are less frequent since starting Aimovig  in January.  She says that she does not eat late at night, usually makes sure she eats before 7 PM.  She was also prescribed Carafate  tablets, but she never took that.  Feels like one of the medications, either prevacid  or pepcid , could be causing her some constipation  CT scan abdomen and pelvis with contrast 05/2024: IMPRESSION: 1. No acute findings in the abdomen pelvis. 2. Extensive diverticulosis of the descending colon and sigmoid colon without acute inflammation.  Ultrasound 05/2024 unremarkable.  HIDA scan 09/2021 was normal.  EGD 09/2021:  - Z- line regular, 38 cm from the incisors. - Small hiatal hernia. - Normal stomach. - Normal examined duodenum. - No specimens collected.   Past Medical History:  Diagnosis Date   Acute pharyngitis 02/26/2009   Qualifier: Diagnosis of  By: Antonio ROSALEA Rockers     Arthritis    hands and neck    Asthma    Essential hypertension 02/07/2007   Qualifier: Diagnosis of  By: Antonio ROSALEA Rockers Britt)    HTN (hypertension)    Insomnia 12/23/2017   Lower  extremity edema 07/31/2017   Migraine 05/23/2017   Pneumonia    SINUSITIS - ACUTE-NOS 03/17/2009   Qualifier: Diagnosis of  By: Antonio ROSALEA Rockers     Skin cancer    basal cell   SKIN CANCER, HX OF 01/08/2008   Annotation: BSC and SCC Qualifier: Diagnosis of  By: Antonio ROSALEA Rockers   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 Amy [place]    URI 02/07/2007   Qualifier: Diagnosis of  By: Antonio ROSALEA Rockers     Past Surgical History:  Procedure Laterality Date   ABDOMINAL HYSTERECTOMY  1995   AUGMENTATION MAMMAPLASTY N/A    Removed 05/25/2021   BREAST ENHANCEMENT SURGERY Bilateral    BREAST IMPLANT REMOVAL Bilateral    with breast lift   CATARACT EXTRACTION W/ INTRAOCULAR LENS IMPLANT Bilateral 03/03/2017   cataract sx with lens implant   COLONOSCOPY  2007   colon--negative   NASAL SEPTUM SURGERY  1983   REDUCTION MAMMAPLASTY     breast lift, 05/25/2021   ROTATOR CUFF REPAIR Right 2000   TUBAL LIGATION  1978   WISDOM TOOTH EXTRACTION      reports that she quit smoking about 25 years ago. Her smoking use included cigarettes. She started smoking about 60 years ago. She has a 10.6 pack-year smoking history. She has never used smokeless tobacco. She reports that she does not drink  alcohol and does not use drugs. family history includes Arthritis in her father; Dementia in her mother; Diabetes in her father; Hypertension in her father and mother; Melanoma in her brother; Prostate cancer in her father. Allergies  Allergen Reactions   Avapro  [Irbesartan ] Cough   Codeine Other (See Comments)    Makes pt hyper    Metoprolol  Cough    Metallic taste , nasal drainage    Norvasc  [Amlodipine  Besylate] Swelling      Outpatient Encounter Medications as of 07/05/2024  Medication Sig   acetaminophen  (TYLENOL ) 500 MG tablet Take 500 mg by mouth every 6 (six) hours as needed.   Erenumab -aooe (AIMOVIG ) 70 MG/ML SOAJ Inject 70 mg into the skin every 30 (thirty) days.   estradiol   (VIVELLE -DOT) 0.05 MG/24HR patch Place 1 patch (0.05 mg total) onto the skin 2 (two) times a week.   Eszopiclone  3 MG TABS Take 1 tablet (3 mg total) by mouth daily.   famotidine  (PEPCID ) 20 MG tablet Take 1 tablet (20 mg total) by mouth at bedtime.   lansoprazole  (PREVACID ) 30 MG capsule 1 po q am   ondansetron  (ZOFRAN ) 4 MG tablet Take 1 tablet (4 mg total) by mouth every 8 (eight) hours as needed for nausea or vomiting.   promethazine  (PHENERGAN ) 25 MG tablet Take 1 tablet (25 mg total) by mouth as needed.   sucralfate  (CARAFATE ) 1 g tablet Take 1 tablet (1 g total) by mouth 4 (four) times daily -  with meals and at bedtime.   SUMAtriptan  (IMITREX ) 100 MG tablet TAKE 1 TABLET (100 MG TOTAL) BY MOUTH AS NEEDED FOR MIGRAINE. MAY REPEAT IN 2 HOURS IF NEEDED   SYMBICORT  80-4.5 MCG/ACT inhaler INHALE 1 PUFF INTO THE LUNGS IN THE MORNING AND AT BEDTIME.   No facility-administered encounter medications on file as of 07/05/2024.    REVIEW OF SYSTEMS  : All other systems reviewed and negative except where noted in the History of Present Illness.   PHYSICAL EXAM: BP 134/84 (BP Location: Right Arm, Patient Position: Sitting, Cuff Size: Normal)   Pulse 65   Ht 5' 4 (1.626 m)   Wt 154 lb 8 oz (70.1 kg)   BMI 26.52 kg/m  General: Well developed white female in no acute distress Head: Normocephalic and atraumatic Eyes:  Sclerae anicteric, conjunctiva pink. Ears: Normal auditory acuity Lungs: Clear throughout to auscultation; no W/R/R. Heart: Regular rate and rhythm; no M/R/G. Abdomen: Soft, non-distended.  BS present.  Mild epigastric TTP. Musculoskeletal: Symmetrical with no gross deformities  Skin: No lesions on visible extremities Extremities: No edema  Neurological: Alert oriented x 4, grossly non-focal Psychological:  Alert and cooperative. Normal mood and affect  ASSESSMENT AND PLAN: *78 year old female with complaints of some epigastric abdominal pain with reflux and nausea.  Symptoms  have improved somewhat on lansoprazole  30 mg daily in the morning and famotidine  20 mg at bedtime.  Now mostly complaining of nausea upon waking up in the mornings.  She asks if it could be related to nerves.  She has a difficult time sleeping at night and currently uses Lunesta .  I a going to start with changing her lansoprazole  to evening meal with dinner.  Will continue famotidine  at bedtime.  Will see for covering reflux overnight better if that will nausea.  Will have her follow-up here in about 8 to 10 weeks.  If no improvement then may want to consider using something different for dyspepsia or chronic nausea such as mirtazapine or a TCA at  bedtime.  Feel like mirtazapine may be a good option to start with.  This could be tried in place of her Lunesta  to also help her sleep.  Consider abdominal migraines as a diagnosis as well she does have chronic migraine headaches. *Constipation: Suggested that she could try MiraLAX 1 capful mixed in 8 ounces of liquid daily.  She mentioned milk of magnesia and she can certainly try a small amount of this every day or every other day.  Magnesium supplement of some sort may help her bowels and magnesium may help her sleep at nighttime if she wanted to go that route.   CC:  Antonio Cyndee Jamee JONELLE, *

## 2024-07-05 NOTE — Patient Instructions (Addendum)
 Start Miralax 1 capful daily in 8 ounces of liquid.  We have sent the following medications to your pharmacy for you to pick up at your convenience: Prevacid  30 mg evening with dinner.  Famotidine  20 mg at bedtime.   Call in 2-3 weeks to schedule a follow up for in 8 weeks. _______________________________________________________  If your blood pressure at your visit was 140/90 or greater, please contact your primary care physician to follow up on this.  _______________________________________________________  If you are age 76 or older, your body mass index should be between 23-30. Your Body mass index is 26.52 kg/m. If this is out of the aforementioned range listed, please consider follow up with your Primary Care Provider.  If you are age 28 or younger, your body mass index should be between 19-25. Your Body mass index is 26.52 kg/m. If this is out of the aformentioned range listed, please consider follow up with your Primary Care Provider.   ________________________________________________________  The [facility] providers would like to encourage you to use MYCHART to communicate with providers for non-urgent requests or questions.  Due to long hold times on the telephone, sending your provider a message by [facility] may be a faster and more efficient way to get a response.  Please allow 48 business hours for a response.  Please remember that this is for non-urgent requests.  _______________________________________________________  [facility] is using a team-based approach to care.  Your team is made up of your doctor and two to three APPS. Our APPS (Nurse Practitioners and Physician Assistants) work with your physician to ensure care continuity for you. They are fully qualified to address your health concerns and develop a treatment plan. They communicate directly with your gastroenterologist to care for you. Seeing the Advanced Practice Practitioners on your physician's team  can help you by facilitating care more promptly, often allowing for earlier appointments, access to diagnostic testing, procedures, and other specialty referrals.

## 2024-07-12 ENCOUNTER — Other Ambulatory Visit: Payer: Self-pay | Admitting: Family Medicine

## 2024-07-12 DIAGNOSIS — G43809 Other migraine, not intractable, without status migrainosus: Secondary | ICD-10-CM

## 2024-08-13 ENCOUNTER — Other Ambulatory Visit: Payer: Self-pay

## 2024-08-13 ENCOUNTER — Encounter (HOSPITAL_COMMUNITY)
Admission: EM | Disposition: A | Discharge status: Home / Self Care | Payer: Self-pay | Source: Home / Self Care | Attending: Cardiology

## 2024-08-13 ENCOUNTER — Encounter (HOSPITAL_BASED_OUTPATIENT_CLINIC_OR_DEPARTMENT_OTHER): Payer: Self-pay

## 2024-08-13 ENCOUNTER — Inpatient Hospital Stay (HOSPITAL_BASED_OUTPATIENT_CLINIC_OR_DEPARTMENT_OTHER)
Admission: EM | Admit: 2024-08-13 | Discharge: 2024-08-16 | DRG: 251 | Disposition: A | Discharge status: Home / Self Care | Attending: Cardiology | Admitting: Cardiology

## 2024-08-13 ENCOUNTER — Emergency Department (HOSPITAL_BASED_OUTPATIENT_CLINIC_OR_DEPARTMENT_OTHER)

## 2024-08-13 DIAGNOSIS — Z808 Family history of malignant neoplasm of other organs or systems: Secondary | ICD-10-CM | POA: Diagnosis not present

## 2024-08-13 DIAGNOSIS — Z8249 Family history of ischemic heart disease and other diseases of the circulatory system: Secondary | ICD-10-CM

## 2024-08-13 DIAGNOSIS — Z85828 Personal history of other malignant neoplasm of skin: Secondary | ICD-10-CM | POA: Diagnosis not present

## 2024-08-13 DIAGNOSIS — Z87891 Personal history of nicotine dependence: Secondary | ICD-10-CM

## 2024-08-13 DIAGNOSIS — R519 Headache, unspecified: Secondary | ICD-10-CM | POA: Diagnosis not present

## 2024-08-13 DIAGNOSIS — G43909 Migraine, unspecified, not intractable, without status migrainosus: Secondary | ICD-10-CM | POA: Diagnosis present

## 2024-08-13 DIAGNOSIS — Z818 Family history of other mental and behavioral disorders: Secondary | ICD-10-CM

## 2024-08-13 DIAGNOSIS — N1831 Chronic kidney disease, stage 3a: Secondary | ICD-10-CM | POA: Diagnosis not present

## 2024-08-13 DIAGNOSIS — Z90710 Acquired absence of both cervix and uterus: Secondary | ICD-10-CM

## 2024-08-13 DIAGNOSIS — I10 Essential (primary) hypertension: Secondary | ICD-10-CM

## 2024-08-13 DIAGNOSIS — Z8042 Family history of malignant neoplasm of prostate: Secondary | ICD-10-CM

## 2024-08-13 DIAGNOSIS — K219 Gastro-esophageal reflux disease without esophagitis: Secondary | ICD-10-CM | POA: Diagnosis not present

## 2024-08-13 DIAGNOSIS — I2121 ST elevation (STEMI) myocardial infarction involving left circumflex coronary artery: Secondary | ICD-10-CM

## 2024-08-13 DIAGNOSIS — I213 ST elevation (STEMI) myocardial infarction of unspecified site: Principal | ICD-10-CM

## 2024-08-13 DIAGNOSIS — I129 Hypertensive chronic kidney disease with stage 1 through stage 4 chronic kidney disease, or unspecified chronic kidney disease: Secondary | ICD-10-CM | POA: Diagnosis present

## 2024-08-13 DIAGNOSIS — Z833 Family history of diabetes mellitus: Secondary | ICD-10-CM

## 2024-08-13 DIAGNOSIS — I471 Supraventricular tachycardia, unspecified: Secondary | ICD-10-CM | POA: Diagnosis not present

## 2024-08-13 DIAGNOSIS — Z79899 Other long term (current) drug therapy: Secondary | ICD-10-CM | POA: Diagnosis not present

## 2024-08-13 DIAGNOSIS — R0789 Other chest pain: Secondary | ICD-10-CM | POA: Diagnosis not present

## 2024-08-13 DIAGNOSIS — J45909 Unspecified asthma, uncomplicated: Secondary | ICD-10-CM | POA: Diagnosis not present

## 2024-08-13 DIAGNOSIS — I2510 Atherosclerotic heart disease of native coronary artery without angina pectoris: Secondary | ICD-10-CM | POA: Diagnosis present

## 2024-08-13 DIAGNOSIS — Z7951 Long term (current) use of inhaled steroids: Secondary | ICD-10-CM

## 2024-08-13 DIAGNOSIS — I6782 Cerebral ischemia: Secondary | ICD-10-CM | POA: Diagnosis not present

## 2024-08-13 DIAGNOSIS — I2119 ST elevation (STEMI) myocardial infarction involving other coronary artery of inferior wall: Secondary | ICD-10-CM | POA: Diagnosis not present

## 2024-08-13 DIAGNOSIS — D72829 Elevated white blood cell count, unspecified: Secondary | ICD-10-CM | POA: Diagnosis present

## 2024-08-13 DIAGNOSIS — Z9861 Coronary angioplasty status: Secondary | ICD-10-CM

## 2024-08-13 DIAGNOSIS — N182 Chronic kidney disease, stage 2 (mild): Secondary | ICD-10-CM | POA: Insufficient documentation

## 2024-08-13 DIAGNOSIS — E785 Hyperlipidemia, unspecified: Secondary | ICD-10-CM | POA: Diagnosis present

## 2024-08-13 DIAGNOSIS — R079 Chest pain, unspecified: Secondary | ICD-10-CM | POA: Diagnosis not present

## 2024-08-13 DIAGNOSIS — E1122 Type 2 diabetes mellitus with diabetic chronic kidney disease: Secondary | ICD-10-CM | POA: Diagnosis present

## 2024-08-13 HISTORY — PX: CORONARY/GRAFT ACUTE MI REVASCULARIZATION: CATH118305

## 2024-08-13 HISTORY — PX: LEFT HEART CATH AND CORONARY ANGIOGRAPHY: CATH118249

## 2024-08-13 LAB — POCT ACTIVATED CLOTTING TIME
Activated Clotting Time: 164 s
Activated Clotting Time: 279 s

## 2024-08-13 LAB — CBC WITH DIFFERENTIAL/PLATELET
Abs Immature Granulocytes: 0.03 K/uL (ref 0.00–0.07)
Basophils Absolute: 0.1 K/uL (ref 0.0–0.1)
Basophils Relative: 1 %
Eosinophils Absolute: 0.3 K/uL (ref 0.0–0.5)
Eosinophils Relative: 3 %
HCT: 44.9 % (ref 36.0–46.0)
Hemoglobin: 14.9 g/dL (ref 12.0–15.0)
Immature Granulocytes: 0 %
Lymphocytes Relative: 29 %
Lymphs Abs: 2.8 K/uL (ref 0.7–4.0)
MCH: 31.3 pg (ref 26.0–34.0)
MCHC: 33.2 g/dL (ref 30.0–36.0)
MCV: 94.3 fL (ref 80.0–100.0)
Monocytes Absolute: 0.8 K/uL (ref 0.1–1.0)
Monocytes Relative: 8 %
Neutro Abs: 5.7 K/uL (ref 1.7–7.7)
Neutrophils Relative %: 59 %
Platelets: 280 K/uL (ref 150–400)
RBC: 4.76 MIL/uL (ref 3.87–5.11)
RDW: 13.1 % (ref 11.5–15.5)
WBC: 9.8 K/uL (ref 4.0–10.5)
nRBC: 0 % (ref 0.0–0.2)

## 2024-08-13 LAB — APTT: aPTT: 27 s (ref 24–36)

## 2024-08-13 LAB — LIPID PANEL
Cholesterol: 182 mg/dL (ref 0–200)
HDL: 67 mg/dL (ref >40)
LDL Cholesterol: 83 mg/dL (ref 0–99)
Total CHOL/HDL Ratio: 2.7 ratio
Triglycerides: 158 mg/dL — ABNORMAL HIGH (ref <150)
VLDL: 32 mg/dL (ref 0–40)

## 2024-08-13 LAB — LACTIC ACID, PLASMA
Lactic Acid, Venous: 2.6 mmol/L (ref 0.5–1.9)
Lactic Acid, Venous: 2.9 mmol/L (ref 0.5–1.9)

## 2024-08-13 LAB — COMPREHENSIVE METABOLIC PANEL WITH GFR
ALT: <5 U/L (ref 0–44)
AST: 22 U/L (ref 15–41)
Albumin: 4.6 g/dL (ref 3.5–5.0)
Alkaline Phosphatase: 92 U/L (ref 38–126)
Anion gap: 15 (ref 5–15)
BUN: 20 mg/dL (ref 8–23)
CO2: 24 mmol/L (ref 22–32)
Calcium: 9.7 mg/dL (ref 8.9–10.3)
Chloride: 100 mmol/L (ref 98–111)
Creatinine, Ser: 0.83 mg/dL (ref 0.44–1.00)
GFR, Estimated: >60 mL/min (ref >60)
Glucose, Bld: 139 mg/dL — ABNORMAL HIGH (ref 70–99)
Potassium: 4.1 mmol/L (ref 3.5–5.1)
Sodium: 138 mmol/L (ref 135–145)
Total Bilirubin: 0.4 mg/dL (ref 0.0–1.2)
Total Protein: 7.3 g/dL (ref 6.5–8.1)

## 2024-08-13 LAB — TROPONIN I (HIGH SENSITIVITY)
Troponin I (High Sensitivity): 1326 ng/L (ref <18)
Troponin I (High Sensitivity): 19857 ng/L (ref <18)

## 2024-08-13 LAB — CG4 I-STAT (LACTIC ACID): Lactic Acid, Venous: 2 mmol/L (ref 0.5–1.9)

## 2024-08-13 LAB — HEMOGLOBIN A1C
Hgb A1c MFr Bld: 5.2 % (ref 4.8–5.6)
Mean Plasma Glucose: 102.54 mg/dL

## 2024-08-13 LAB — GLUCOSE, CAPILLARY: Glucose-Capillary: 122 mg/dL — ABNORMAL HIGH (ref 70–99)

## 2024-08-13 LAB — MRSA NEXT GEN BY PCR, NASAL: MRSA by PCR Next Gen: NOT DETECTED

## 2024-08-13 LAB — TROPONIN T, HIGH SENSITIVITY: Troponin T High Sensitivity: <15 ng/L (ref 0–19)

## 2024-08-13 LAB — PROTIME-INR
INR: 0.9 (ref 0.8–1.2)
Prothrombin Time: 12.6 s (ref 11.4–15.2)

## 2024-08-13 SURGERY — LEFT HEART CATH AND CORONARY ANGIOGRAPHY
Anesthesia: LOCAL

## 2024-08-13 MED ORDER — MIDAZOLAM HCL (PF) 2 MG/2ML IJ SOLN
INTRAMUSCULAR | Status: DC | PRN
  Administered 2024-08-13 (×2): 1 mg via INTRAVENOUS

## 2024-08-13 MED ORDER — HEPARIN (PORCINE) IN NACL 1000-0.9 UT/500ML-% IV SOLN
INTRAVENOUS | Status: DC | PRN
  Administered 2024-08-13 (×2): 500 mL

## 2024-08-13 MED ORDER — NITROGLYCERIN 0.4 MG SL SUBL
SUBLINGUAL_TABLET | SUBLINGUAL | Status: AC
  Administered 2024-08-13: 0.4 mg
  Filled 2024-08-13: qty 1

## 2024-08-13 MED ORDER — SODIUM CHLORIDE 0.9 % IV SOLN: INTRAVENOUS | Status: AC

## 2024-08-13 MED ORDER — NITROGLYCERIN 0.4 MG SL SUBL
SUBLINGUAL_TABLET | SUBLINGUAL | Status: DC | PRN
  Administered 2024-08-13: .4 mg via SUBLINGUAL

## 2024-08-13 MED ORDER — LABETALOL HCL 5 MG/ML IV SOLN: 10.0000 mg | INTRAVENOUS | Status: AC | PRN

## 2024-08-13 MED ORDER — HEPARIN SODIUM (PORCINE) 1000 UNIT/ML IJ SOLN
INTRAMUSCULAR | Status: AC
  Filled 2024-08-13: qty 10

## 2024-08-13 MED ORDER — HEPARIN (PORCINE) IN NACL 2-0.9 UNITS/ML
INTRAMUSCULAR | Status: DC | PRN
  Administered 2024-08-13: 10 mL via INTRA_ARTERIAL

## 2024-08-13 MED ORDER — ZOLPIDEM TARTRATE 5 MG PO TABS
5.0000 mg | ORAL_TABLET | Freq: Every evening | ORAL | Status: DC | PRN
  Administered 2024-08-13 – 2024-08-15 (×3): 5 mg via ORAL
  Filled 2024-08-13 (×3): qty 1

## 2024-08-13 MED ORDER — NITROGLYCERIN 1 MG/10 ML FOR IR/CATH LAB
INTRA_ARTERIAL | Status: AC
  Filled 2024-08-13: qty 10

## 2024-08-13 MED ORDER — CANGRELOR TETRASODIUM 50 MG IV SOLR
INTRAVENOUS | Status: AC
  Filled 2024-08-13: qty 50

## 2024-08-13 MED ORDER — ACETAMINOPHEN 325 MG PO TABS: 650.0000 mg | ORAL_TABLET | ORAL | Status: DC | PRN

## 2024-08-13 MED ORDER — LIDOCAINE HCL (PF) 1 % IJ SOLN
INTRAMUSCULAR | Status: AC
  Filled 2024-08-13: qty 30

## 2024-08-13 MED ORDER — HEPARIN (PORCINE) 25000 UT/250ML-% IV SOLN
INTRAVENOUS | Status: AC
  Filled 2024-08-13: qty 250

## 2024-08-13 MED ORDER — PANTOPRAZOLE SODIUM 40 MG PO TBEC
40.0000 mg | DELAYED_RELEASE_TABLET | Freq: Every evening | ORAL | Status: DC
Start: 2024-08-13 — End: 2024-08-14
  Administered 2024-08-13: 40 mg via ORAL
  Filled 2024-08-13: qty 1

## 2024-08-13 MED ORDER — MORPHINE SULFATE (PF) 4 MG/ML IV SOLN
4.0000 mg | Freq: Once | INTRAVENOUS | Status: AC
  Administered 2024-08-13: 4 mg via INTRAVENOUS
  Filled 2024-08-13: qty 1

## 2024-08-13 MED ORDER — MIDAZOLAM HCL 2 MG/2ML IJ SOLN
INTRAMUSCULAR | Status: AC
  Filled 2024-08-13: qty 2

## 2024-08-13 MED ORDER — IRBESARTAN 75 MG PO TABS
75.0000 mg | ORAL_TABLET | Freq: Every day | ORAL | Status: DC
  Filled 2024-08-13: qty 1

## 2024-08-13 MED ORDER — HEPARIN SODIUM (PORCINE) 1000 UNIT/ML IJ SOLN
INTRAMUSCULAR | Status: DC | PRN
  Administered 2024-08-13: 4000 [IU] via INTRAVENOUS

## 2024-08-13 MED ORDER — TICAGRELOR 90 MG PO TABS
90.0000 mg | ORAL_TABLET | Freq: Two times a day (BID) | ORAL | Status: DC
  Administered 2024-08-14 – 2024-08-16 (×5): 90 mg via ORAL
  Filled 2024-08-13 (×5): qty 1

## 2024-08-13 MED ORDER — OXYCODONE HCL 5 MG PO TABS
5.0000 mg | ORAL_TABLET | ORAL | Status: DC | PRN
  Administered 2024-08-13 – 2024-08-14 (×3): 10 mg via ORAL
  Filled 2024-08-13 (×3): qty 2

## 2024-08-13 MED ORDER — ATORVASTATIN CALCIUM 80 MG PO TABS
80.0000 mg | ORAL_TABLET | Freq: Every day | ORAL | Status: DC
  Administered 2024-08-13 – 2024-08-16 (×4): 80 mg via ORAL
  Filled 2024-08-13 (×4): qty 1

## 2024-08-13 MED ORDER — NITROGLYCERIN 1 MG/10 ML FOR IR/CATH LAB
INTRA_ARTERIAL | Status: DC | PRN
  Administered 2024-08-13 (×2): 200 ug via INTRACORONARY

## 2024-08-13 MED ORDER — TICAGRELOR 90 MG PO TABS
180.0000 mg | ORAL_TABLET | Freq: Once | ORAL | Status: AC
  Administered 2024-08-13: 180 mg via ORAL
  Filled 2024-08-13: qty 2

## 2024-08-13 MED ORDER — SODIUM CHLORIDE 0.9% FLUSH: 3.0000 mL | INTRAVENOUS | Status: DC | PRN

## 2024-08-13 MED ORDER — SODIUM CHLORIDE 0.9 % IV SOLN
INTRAVENOUS | Status: AC | PRN
  Administered 2024-08-13: 4 ug/kg/min via INTRAVENOUS

## 2024-08-13 MED ORDER — HEPARIN (PORCINE) 25000 UT/250ML-% IV SOLN
950.0000 [IU]/h | INTRAVENOUS | Status: AC
  Administered 2024-08-14: 800 [IU]/h via INTRAVENOUS
  Administered 2024-08-15: 950 [IU]/h via INTRAVENOUS
  Filled 2024-08-13 (×2): qty 250

## 2024-08-13 MED ORDER — IOHEXOL 350 MG/ML SOLN
INTRAVENOUS | Status: DC | PRN
  Administered 2024-08-13: 120 mL

## 2024-08-13 MED ORDER — MORPHINE SULFATE (PF) 2 MG/ML IV SOLN
INTRAVENOUS | Status: AC
  Filled 2024-08-13: qty 1

## 2024-08-13 MED ORDER — MIDAZOLAM HCL (PF) 2 MG/2ML IJ SOLN
1.0000 mg | Freq: Once | INTRAMUSCULAR | Status: AC
  Administered 2024-08-13: 1 mg via INTRAVENOUS
  Filled 2024-08-13: qty 2

## 2024-08-13 MED ORDER — ONDANSETRON HCL 4 MG/2ML IJ SOLN
4.0000 mg | Freq: Once | INTRAMUSCULAR | Status: AC
  Administered 2024-08-13: 4 mg via INTRAVENOUS
  Filled 2024-08-13: qty 2

## 2024-08-13 MED ORDER — SODIUM CHLORIDE 0.9% FLUSH
3.0000 mL | Freq: Two times a day (BID) | INTRAVENOUS | Status: DC
  Administered 2024-08-13 – 2024-08-16 (×5): 3 mL via INTRAVENOUS

## 2024-08-13 MED ORDER — LIDOCAINE HCL (PF) 1 % IJ SOLN
INTRAMUSCULAR | Status: DC | PRN
Start: 2024-08-13 — End: 2024-08-13
  Administered 2024-08-13: 2 mL via INTRADERMAL

## 2024-08-13 MED ORDER — MORPHINE SULFATE (PF) 2 MG/ML IV SOLN
2.0000 mg | INTRAVENOUS | Status: DC | PRN
  Administered 2024-08-13 – 2024-08-14 (×3): 2 mg via INTRAVENOUS
  Filled 2024-08-13 (×3): qty 1

## 2024-08-13 MED ORDER — FLUTICASONE FUROATE-VILANTEROL 100-25 MCG/ACT IN AEPB
1.0000 | INHALATION_SPRAY | Freq: Every day | RESPIRATORY_TRACT | Status: DC
  Administered 2024-08-15 – 2024-08-16 (×2): 1 via RESPIRATORY_TRACT
  Filled 2024-08-13: qty 28

## 2024-08-13 MED ORDER — ONDANSETRON HCL 4 MG/2ML IJ SOLN
4.0000 mg | Freq: Four times a day (QID) | INTRAMUSCULAR | Status: DC | PRN
  Administered 2024-08-13 – 2024-08-14 (×4): 4 mg via INTRAVENOUS
  Filled 2024-08-13 (×5): qty 2

## 2024-08-13 MED ORDER — NITROGLYCERIN 0.4 MG SL SUBL
SUBLINGUAL_TABLET | SUBLINGUAL | Status: AC
  Filled 2024-08-13: qty 1

## 2024-08-13 MED ORDER — ONDANSETRON HCL 4 MG/2ML IJ SOLN
INTRAMUSCULAR | Status: AC
  Filled 2024-08-13: qty 2

## 2024-08-13 MED ORDER — SODIUM CHLORIDE 0.9 % IV SOLN
4.0000 ug/kg/min | INTRAVENOUS | Status: AC
  Filled 2024-08-13: qty 50

## 2024-08-13 MED ORDER — HEPARIN SODIUM (PORCINE) 5000 UNIT/ML IJ SOLN
4000.0000 [IU] | Freq: Once | INTRAMUSCULAR | Status: AC
  Administered 2024-08-13: 4000 [IU] via INTRAVENOUS
  Filled 2024-08-13: qty 1

## 2024-08-13 MED ORDER — MORPHINE SULFATE (PF) 2 MG/ML IV SOLN
INTRAVENOUS | Status: DC | PRN
  Administered 2024-08-13: 2 mg via INTRAVENOUS

## 2024-08-13 MED ORDER — SODIUM CHLORIDE 0.9 % IV SOLN: 250.0000 mL | INTRAVENOUS | Status: AC | PRN

## 2024-08-13 MED ORDER — HYDRALAZINE HCL 20 MG/ML IJ SOLN
10.0000 mg | INTRAMUSCULAR | Status: AC | PRN
  Administered 2024-08-13: 10 mg via INTRAVENOUS
  Filled 2024-08-13: qty 1

## 2024-08-13 MED ORDER — FAMOTIDINE 20 MG PO TABS
20.0000 mg | ORAL_TABLET | Freq: Every day | ORAL | Status: DC
Start: 2024-08-13 — End: 2024-08-14
  Administered 2024-08-13: 20 mg via ORAL
  Filled 2024-08-13: qty 1

## 2024-08-13 MED ORDER — ONDANSETRON HCL 4 MG/2ML IJ SOLN
INTRAMUSCULAR | Status: DC | PRN
  Administered 2024-08-13: 4 mg via INTRAVENOUS

## 2024-08-13 MED ORDER — MORPHINE SULFATE (PF) 2 MG/ML IV SOLN
2.0000 mg | INTRAVENOUS | Status: DC | PRN
  Filled 2024-08-13: qty 1

## 2024-08-13 MED ORDER — ASPIRIN 81 MG PO CHEW
81.0000 mg | CHEWABLE_TABLET | Freq: Every day | ORAL | Status: DC
  Administered 2024-08-14 – 2024-08-15 (×2): 81 mg via ORAL
  Filled 2024-08-13 (×2): qty 1

## 2024-08-13 MED ORDER — SUCRALFATE 1 G PO TABS
1.0000 g | ORAL_TABLET | Freq: Three times a day (TID) | ORAL | Status: DC
  Administered 2024-08-14 – 2024-08-16 (×8): 1 g via ORAL
  Filled 2024-08-13 (×12): qty 1

## 2024-08-13 MED ORDER — STERILE WATER FOR INJECTION IJ SOLN
INTRAMUSCULAR | Status: AC
Start: 2024-08-13 — End: 2024-08-13
  Filled 2024-08-13: qty 10

## 2024-08-13 MED ORDER — PANTOPRAZOLE SODIUM 40 MG PO TBEC: 40.0000 mg | DELAYED_RELEASE_TABLET | Freq: Every day | ORAL | Status: DC

## 2024-08-13 MED ORDER — CANGRELOR BOLUS VIA INFUSION
INTRAVENOUS | Status: DC | PRN
  Administered 2024-08-13: 2100 ug via INTRAVENOUS

## 2024-08-13 SURGICAL SUPPLY — 14 items
BALLOON EMERGE MR 2.0X12 (BALLOONS) IMPLANT
BALLOON SAPPHIRE 2.5X12 (BALLOONS) IMPLANT
CATH 5FR JL3.5 JR4 ANG PIG MP (CATHETERS) IMPLANT
CATH VISTA GUIDE 6FR XB3.5 EPK (CATHETERS) IMPLANT
DEVICE RAD COMP TR BAND LRG (VASCULAR PRODUCTS) IMPLANT
ELECT DEFIB PAD ADLT CADENCE (PAD) IMPLANT
GLIDESHEATH SLEND SS 6F .021 (SHEATH) IMPLANT
GUIDEWIRE INQWIRE 1.5J.035X260 (WIRE) IMPLANT
KIT ENCORE 26 ADVANTAGE (KITS) IMPLANT
PACK CARDIAC CATHETERIZATION (CUSTOM PROCEDURE TRAY) ×1 IMPLANT
SET ATX-X65L (MISCELLANEOUS) IMPLANT
SHEATH PROBE COVER 6X72 (BAG) IMPLANT
WIRE ASAHI PROWATER 180CM (WIRE) IMPLANT
WIRE MICROINTRODUCER 60CM (WIRE) IMPLANT

## 2024-08-13 NOTE — ED Triage Notes (Signed)
 Mid chest pain, jaw pain since this afternoon. Nausea.  Denies SHOB  Took 325 mg of aspirin at home  Pt in obvious distress in triage, EDP and pt brought to rm 14

## 2024-08-13 NOTE — Progress Notes (Signed)
 PHARMACY - ANTICOAGULATION CONSULT NOTE  Pharmacy Consult for Heparin infusion Indication: chest pain/ACS  Allergies  Allergen Reactions   Avapro  [Irbesartan ] Cough   Codeine Other (See Comments)    Makes pt hyper    Metoprolol  Cough    Metallic taste , nasal drainage    Norvasc  [Amlodipine  Besylate] Swelling    Patient Measurements: Weight: 70 kg (154 lb 5.2 oz)  Vital Signs: Temp: 99.1 F (37.3 C) (11/11 1939) Temp Source: Axillary (11/11 1939) BP: 159/94 (11/11 1832) Pulse Rate: 0 (11/11 1832)  Labs: Recent Labs    08/13/24 1633  HGB 14.9  HCT 44.9  PLT 280  APTT 27  LABPROT 12.6  INR 0.9  CREATININE 0.83    Estimated Creatinine Clearance: 56.2 mL/min (by C-G formula based on SCr of 0.83 mg/dL).   Medical History: Past Medical History:  Diagnosis Date   Acute pharyngitis 02/26/2009   Qualifier: Diagnosis of  By: Antonio ROSALEA Rockers     Arthritis    hands and neck    Asthma    Essential hypertension 02/07/2007   Qualifier: Diagnosis of  By: Antonio ROSALEA Rockers Britt)    HTN (hypertension)    Insomnia 12/23/2017   Lower extremity edema 07/31/2017   Migraine 05/23/2017   Pneumonia    SINUSITIS - ACUTE-NOS 03/17/2009   Qualifier: Diagnosis of  By: Antonio ROSALEA Rockers     Skin cancer    basal cell   SKIN CANCER, HX OF 01/08/2008   Annotation: BSC and SCC Qualifier: Diagnosis of  By: Antonio ROSALEA Rockers   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 Amy Jordan    URI 02/07/2007   Qualifier: Diagnosis of  By: Antonio ROSALEA Rockers      Medications:  Infusions:   sodium chloride  15 mL/hr at 08/13/24 1640   sodium chloride  50 mL/hr at 08/13/24 2008   sodium chloride      cangrelor (KENGREAL) 50 mg in sodium chloride  0.9 % 250 mL (0.2 mg/mL) infusion 4 mcg/kg/min (08/13/24 2009)   heparin     [START ON 08/14/2024] heparin      Assessment: 77 YOF STEMI alert. Patient received 4000 units heparin prior to arrival and additional 4000  units in cath lab. Patient is not on anticoagulation prior to arrival. Patient received cangrelor load and ~2 hour infusion as well as ticagrelor load.   Plan to initiate heparin 8 hours after sheath removal and continue for 48 hours.  Goal of Therapy:  Heparin level 0.3-0.7 units/ml Monitor platelets by anticoagulation protocol: Yes   Plan:  Start heparin infusion at 800 units/hr Check anti-Xa level in 6 hours and daily while on heparin Continue to monitor H&H and platelets  Larraine Brazier, PharmD Clinical Pharmacist 08/13/2024  8:42 PM **Pharmacist phone directory can now be found on [email or web address] ([email or web address]).  Listed under [facility].

## 2024-08-13 NOTE — ED Provider Notes (Signed)
 Emergency Department Provider Note   I have reviewed the triage vital signs and the nursing notes.   HISTORY  Chief Complaint Chest Pain   HPI Laura Hopkins is a 78 y.o. female with past history reviewed below presents to the emergency department with acute onset chest pain starting around an hour prior to arrival.  No nausea/vomiting.  No diaphoresis.  No heart issues in the past.  She is having ongoing chest pain.  She took 325 mg of aspirin prior to arrival. Level 5 caveat: Acute distress.    Past Medical History:  Diagnosis Date   Acute pharyngitis 02/26/2009   Qualifier: Diagnosis of  By: Antonio ROSALEA Rockers     Arthritis    hands and neck    Asthma    Essential hypertension 02/07/2007   Qualifier: Diagnosis of  By: Antonio ROSALEA Rockers Britt)    HTN (hypertension)    Insomnia 12/23/2017   Lower extremity edema 07/31/2017   Migraine 05/23/2017   Pneumonia    SINUSITIS - ACUTE-NOS 03/17/2009   Qualifier: Diagnosis of  By: Antonio ROSALEA Rockers     Skin cancer    basal cell   SKIN CANCER, HX OF 01/08/2008   Annotation: BSC and SCC Qualifier: Diagnosis of  By: Antonio ROSALEA Rockers   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 Amy Jordan    URI 02/07/2007   Qualifier: Diagnosis of  By: Antonio ROSALEA Rockers      Review of Systems  Constitutional: No fever/chills Cardiovascular: Positive chest pain. Respiratory: Denies shortness of breath. Gastrointestinal: No abdominal pain. Positive nausea, no vomiting.  No diarrhea.   Skin: Negative for rash.  ____________________________________________   PHYSICAL EXAM:  VITAL SIGNS: ED Triage Vitals  Encounter Vitals Group     BP 08/13/24 1633 (!) 185/113     Pulse Rate 08/13/24 1633 75     Resp 08/13/24 1633 (!) 26     Temp --      Temp src --      SpO2 08/13/24 1633 100 %   Constitutional: Alert but in acute distress and very uncomfortable appearing.  Eyes: Conjunctivae are normal.  Head:  Atraumatic. Nose: No congestion/rhinnorhea. Mouth/Throat: Mucous membranes are moist.  Neck: No stridor.   Cardiovascular: Normal rate, regular rhythm. Good peripheral circulation. Grossly normal heart sounds.   Respiratory: Normal respiratory effort.  No retractions. Lungs CTAB. Gastrointestinal: Soft and nontender. No distention.  Musculoskeletal: No gross deformities of extremities. Neurologic:  Normal speech and language.  Skin:  Skin is warm, dry and intact. No rash noted.  ____________________________________________   LABS (all labs ordered are listed, but only abnormal results are displayed)  Labs Reviewed  COMPREHENSIVE METABOLIC PANEL WITH GFR - Abnormal; Notable for the following components:      Result Value   Glucose, Bld 139 (*)    All other components within normal limits  LIPID PANEL - Abnormal; Notable for the following components:   Triglycerides 158 (*)    All other components within normal limits  LACTIC ACID, PLASMA - Abnormal; Notable for the following components:   Lactic Acid, Venous 2.6 (*)    All other components within normal limits  LACTIC ACID, PLASMA - Abnormal; Notable for the following components:   Lactic Acid, Venous 2.9 (*)    All other components within normal limits  CG4 I-STAT (LACTIC ACID) - Abnormal; Notable for the following components:   Lactic Acid, Venous 2.0 (*)  All other components within normal limits  TROPONIN I (HIGH SENSITIVITY) - Abnormal; Notable for the following components:   Troponin I (High Sensitivity) 1,326 (*)    All other components within normal limits  MRSA NEXT GEN BY PCR, NASAL  HEMOGLOBIN A1C  CBC WITH DIFFERENTIAL/PLATELET  PROTIME-INR  APTT  BASIC METABOLIC PANEL WITH GFR  CBC  LIPOPROTEIN A (LPA)  HEPARIN LEVEL (UNFRACTIONATED)  POCT ACTIVATED CLOTTING TIME  POCT ACTIVATED CLOTTING TIME  TROPONIN T, HIGH SENSITIVITY  TROPONIN I (HIGH SENSITIVITY)    ____________________________________________  EKG   EKG Interpretation Date/Time:  Tuesday August 13 2024 16:31:13 EST Ventricular Rate:  73 PR Interval:  158 QRS Duration:  89 QT Interval:  408 QTC Calculation: 450 R Axis:   83  Text Interpretation: Sinus rhythm Inferior infarct, acute (RCA) Lateral leads are also involved Probable RV involvement, suggest recording right precordial leads Baseline wander in lead(s) II III aVF V6 >>> Acute MI <<< Confirmed by Darra Chew [id number]) on 08/13/2024 4:45:21 PM        ____________________________________________  RADIOLOGY  CARDIAC CATHETERIZATION Result Date: 08/13/2024 Images from the original result were not included.   Dist Cx lesion is 100% stenosed with 100% stenosed side branch in 4th Mrg.  TIMI 0 flow last Peroneal   Balloon angioplasty was performed using a BALLOON EMERGE MR 2.0X12.   Post intervention, there is a 30% residual stenosis. Post intervention, the side branch was reduced to 50% residual stenosis.  TIMI I-II flow up to the next occlusion site restored.   4th Mrg (extension of distal LCx) lesion is 100% stenosed.-Likely distal normalization from the original infarction rating   At this point, decision making with Dr. Verlin was to accept borderline successful PTCA in order to avoid potential dangerous perforation.  Very distal to the wire this vessel distally.  Allow time for IV medications to work.   ---------------------------------------   Otherwise normal LAD with small diagonals, LCx with 3 other OM's and RCA with PDA and 3 PL's.   LV end diastolic pressure is severely elevated.  There is no aortic valve stenosis. Diagnostic: Dominance: Right     Intervention. Acute Inferolateral STEMI involving the distal LCx and a very tortuous vessel: 100% occlusion after major bend in the takeoff of a small sidebranch. => Minimally successful PTCA restoring flow to another 40 mm of the vessel but still no reflow distally despite  IC NTG and angioplasty with small pressures. Due to the tortuosity, I opted to forego further treatment and left balloon and plasty result only. LV gram not performed-concern due to LVEDP estimated 25 mmHg.  Plan: Admit to ICU -continue cangrelor until current bag complete.  Would load with Brilinta 180 mg prior to completion of cangrelor. Initiate heparin 8 hours after sheath removal = plan would be to continue 48 hours of IV heparin IV analgesics ordered for pain control given the ongoing chest pain. Will check 2D Echo. Multiple different medication intolerances, will start low-dose irbesartan  and consider bisoprolol. Alm Clay, MD   DG Chest Port 1 View Result Date: 08/13/2024 CLINICAL DATA:  Chest pain. EXAM: PORTABLE CHEST 1 VIEW COMPARISON:  Chest radiograph dated 11/07/2019. FINDINGS: The heart size and mediastinal contours are within normal limits. Both lungs are clear. The visualized skeletal structures are unremarkable. IMPRESSION: No active disease. Electronically Signed   By: Vanetta Chou M.D.   On: 08/13/2024 16:54    ____________________________________________   PROCEDURES  Procedure(s) performed:   Procedures  CRITICAL CARE Performed  by: Fonda KANDICE Law Total critical care time: 35 minutes Critical care time was exclusive of separately billable procedures and treating other patients. Critical care was necessary to treat or prevent imminent or life-threatening deterioration. Critical care was time spent personally by me on the following activities: development of treatment plan with patient and/or surrogate as well as nursing, discussions with consultants, evaluation of patient's response to treatment, examination of patient, obtaining history from patient or surrogate, ordering and performing treatments and interventions, ordering and review of laboratory studies, ordering and review of radiographic studies, pulse oximetry and re-evaluation of patient's condition.  Fonda Law, MD Emergency Medicine  ____________________________________________   INITIAL IMPRESSION / ASSESSMENT AND PLAN / ED COURSE  Pertinent labs & imaging results that were available during my care of the patient were reviewed by me and considered in my medical decision making (see chart for details).   This patient is Presenting for Evaluation of CP, which does require a range of treatment options, and is a complaint that involves a high risk of morbidity and mortality.  The Differential Diagnoses includes but is not exclusive to acute coronary syndrome, aortic dissection, pulmonary embolism, cardiac tamponade, community-acquired pneumonia, pericarditis, musculoskeletal chest wall pain, etc.   Critical Interventions-    Medications  0.9 %  sodium chloride  infusion ( Intravenous Rate/Dose Change 08/13/24 1720)  heparin 25000 UT/250ML infusion (  Not Given 08/13/24 1655)  nitroGLYCERIN 100 mcg/mL intra-arterial injection (  Not Given 08/13/24 2135)  zolpidem (AMBIEN) tablet 5 mg (has no administration in time range)  pantoprazole  (PROTONIX ) EC tablet 40 mg (40 mg Oral Given 08/13/24 1950)  famotidine  (PEPCID ) tablet 20 mg (20 mg Oral Given 08/13/24 2134)  sucralfate  (CARAFATE ) tablet 1 g (1 g Oral Not Given 08/13/24 2135)  fluticasone furoate-vilanterol (BREO ELLIPTA) 100-25 MCG/ACT 1 puff (1 puff Inhalation Not Given 08/13/24 1946)  labetalol (NORMODYNE) injection 10 mg (has no administration in time range)  hydrALAZINE (APRESOLINE) injection 10 mg (has no administration in time range)  acetaminophen  (TYLENOL ) tablet 650 mg (has no administration in time range)  ondansetron  (ZOFRAN ) injection 4 mg (4 mg Intravenous Given 08/13/24 2023)  0.9 %  sodium chloride  infusion ( Intravenous Infusion Verify 08/13/24 2100)  sodium chloride  flush (NS) 0.9 % injection 3 mL (3 mLs Intravenous Given 08/13/24 2140)  sodium chloride  flush (NS) 0.9 % injection 3 mL (has no administration in time range)   0.9 %  sodium chloride  infusion (has no administration in time range)  oxyCODONE (Oxy IR/ROXICODONE) immediate release tablet 5-10 mg (has no administration in time range)  aspirin chewable tablet 81 mg (has no administration in time range)  ticagrelor (BRILINTA) tablet 90 mg (has no administration in time range)  cangrelor (KENGREAL) 50 mg in sodium chloride  0.9 % 250 mL (0.2 mg/mL) infusion (4 mcg/kg/min  70 kg Intravenous Infusion Verify 08/13/24 2100)  irbesartan  (AVAPRO ) tablet 75 mg (has no administration in time range)  atorvastatin (LIPITOR) tablet 80 mg (80 mg Oral Given 08/13/24 1950)  morphine (PF) 2 MG/ML injection 2 mg (2 mg Intravenous Given 08/13/24 1945)  heparin ADULT infusion 100 units/mL (25000 units/250mL) (has no administration in time range)  heparin injection 4,000 Units (4,000 Units Intravenous Given 08/13/24 1640)  nitroGLYCERIN (NITROSTAT) 0.4 MG SL tablet (0.4 mg  Given 08/13/24 1638)  morphine (PF) 4 MG/ML injection 4 mg (4 mg Intravenous Given 08/13/24 1647)  ondansetron  (ZOFRAN ) injection 4 mg (4 mg Intravenous Given 08/13/24 1647)  cangrelor (KENGREAL) 50,000 mcg in sodium  chloride 0.9 % 250 mL (200 mcg/mL) infusion (4 mcg/kg/min  70 kg Intravenous New Bag/Given 08/13/24 1753)  ticagrelor (BRILINTA) tablet 180 mg (180 mg Oral Given 08/13/24 1949)  midazolam PF (VERSED) injection 1 mg (1 mg Intravenous Given 08/13/24 2128)    Reassessment after intervention: pain reduced but not resolved.    I did obtain Additional Historical Information from husband at bedside.   Clinical Laboratory Tests Ordered, included lactic acid 2.6 with no leukocytosis.  Doubt sepsis.  Initial troponin normal.   Radiologic Tests Ordered, included CXR. I independently interpreted the images and agree with radiology interpretation.   Cardiac Monitor Tracing which shows NSR.    Social Determinants of Health Risk patient is a non-smoker.   Consult complete with Cardiology Dr.  [place]. Heparin given. Patient to go directly to cath lab directly.   Medical Decision Making: Summary:  Patient arrives in acute distress with active chest pain.  She appears very unwell and EKG shows ST elevation inferiorly.  Code STEMI activated and [facility] called for immediate transport.  Spoke with cardiology as above who accept the patient to [facility] Cath Lab emergently.   Reevaluation with update and discussion with patient and husband at bedside.  Awaiting transport at this time.  Pain is decreased after single nitroglycerin and morphine.  No hypotension or severe bradycardia.  Patient continues to look unwell but vitals stable for transport.   Patient's presentation is most consistent with acute presentation with potential threat to life or bodily function.   Disposition: transfer - Cath Lab  ____________________________________________  FINAL CLINICAL IMPRESSION(S) / ED DIAGNOSES  Final diagnoses:  ST elevation myocardial infarction (STEMI), unspecified artery (HCC)    Note:  This document was prepared using Dragon voice recognition software and may include unintentional dictation errors.  Fonda Law, MD, [facility] Emergency Medicine    Shirel Mallis, Fonda MATSU, MD 08/13/24 2206

## 2024-08-13 NOTE — H&P (Cosign Needed)
 Cardiology Admission History and Physical   Patient ID: Laura Hopkins MRN: [id number]; DOB: [birth year]   Admission date: 08/13/2024  PCP:  Laura Cyndee Jamee JONELLE, DO   [facility] Providers Cardiologist:  New to Dr. Anner Finn here to update MD or APP on Care Team, Refresh:1}    Chief Complaint:  chest pain  Patient Profile: Laura Hopkins is a 78 y.o. female with remote smoking hx, HTN, asthma, insomnia, migraines, prior skin cancer who is being seen 08/13/2024 for the evaluation of chest pain and STEMI.  History of Present Illness: [name] has no prior cardiac history. She reports she had previously been on BP medication but stopped a few months ago as her BP had normalized out, though took one this morning. She has been dealing with atypical abdominal pain for several months without clear etiology, no bleeding. Abd US  and CT a/p were unremarkable except for diverticulosis. She had occasionally noticed this under her left breast. Today while at rest at 3:30pm she developed acute onset severe substernal chest pain radiating to her jaw without associated n/v, diaphoresis, palpitations or syncope. She took a full aspirin 325mg  at home and came to [facility] ER where EKG showed acute ST elevation inferiorly as well as V5-V6. Code STEMI was called. She received 4mg  morphine, 4000u heparin, SL NTG, Zofran . She was transported to [facility] directly to the cath lab where she continues to complain of 8/10 chest pain. She appears unwell and diaphoretic. + Former smoker, no ETOH/drug use, no family hx of CAD. Initial BP 185/113, updated reassessment 136/94.   Past Medical History:  Diagnosis Date   Acute pharyngitis 02/26/2009   Qualifier: Diagnosis of  By: Laura ROSALEA Jamee     Arthritis    hands and neck    Asthma    Essential hypertension 02/07/2007   Qualifier: Diagnosis of  By: Laura ROSALEA Jamee Britt)    HTN (hypertension)    Insomnia 12/23/2017   Lower  extremity edema 07/31/2017   Migraine 05/23/2017   Pneumonia    SINUSITIS - ACUTE-NOS 03/17/2009   Qualifier: Diagnosis of  By: Laura ROSALEA Jamee     Skin cancer    basal cell   SKIN CANCER, HX OF 01/08/2008   Annotation: BSC and SCC Qualifier: Diagnosis of  By: Laura ROSALEA Jamee   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 Amy Jordan    URI 02/07/2007   Qualifier: Diagnosis of  By: Laura ROSALEA Jamee     Past Surgical History:  Procedure Laterality Date   ABDOMINAL HYSTERECTOMY  1995   AUGMENTATION MAMMAPLASTY N/A    Removed 05/25/2021   BREAST ENHANCEMENT SURGERY Bilateral    BREAST IMPLANT REMOVAL Bilateral    with breast lift   CATARACT EXTRACTION W/ INTRAOCULAR LENS IMPLANT Bilateral 03/03/2017   cataract sx with lens implant   COLONOSCOPY  2007   colon--negative   NASAL SEPTUM SURGERY  1983   REDUCTION MAMMAPLASTY     breast lift, 05/25/2021   ROTATOR CUFF REPAIR Right 2000   TUBAL LIGATION  1978   WISDOM TOOTH EXTRACTION       Medications Prior to Admission: Prior to Admission medications   Medication Sig Start Date End Date Taking? Authorizing Provider  acetaminophen  (TYLENOL ) 500 MG tablet Take 500 mg by mouth every 6 (six) hours as needed.    [provider]  Erenumab -aooe (AIMOVIG ) 70 MG/ML SOAJ Inject 70 mg into the  skin every 30 (thirty) days. 07/12/24   Lowne Chase, Yvonne R, DO  estradiol  (VIVELLE -DOT) 0.05 MG/24HR patch Place 1 patch (0.05 mg total) onto the skin 2 (two) times a week. 03/25/24   Laura Cyndee Rockers R, DO  Eszopiclone  3 MG TABS Take 1 tablet (3 mg total) by mouth daily. 03/22/24   Laura Cyndee Rockers JONELLE, DO  famotidine  (PEPCID ) 20 MG tablet Take 1 tablet (20 mg total) by mouth at bedtime. 07/05/24   Zehr, Jessica D, PA-C  lansoprazole  (PREVACID ) 30 MG capsule Take 1 capsule (30 mg total) by mouth every evening. 07/05/24   Zehr, Jessica D, PA-C  ondansetron  (ZOFRAN ) 4 MG tablet Take 1 tablet (4 mg total) by mouth every 8  (eight) hours as needed for nausea or vomiting. 02/09/24   Laura Cyndee Rockers JONELLE, DO  promethazine  (PHENERGAN ) 25 MG tablet Take 1 tablet (25 mg total) by mouth as needed. 01/09/23   Laura Cyndee Rockers R, DO  sucralfate  (CARAFATE ) 1 g tablet Take 1 tablet (1 g total) by mouth 4 (four) times daily -  with meals and at bedtime. 05/28/24   Laura Cyndee Rockers R, DO  SUMAtriptan  (IMITREX ) 100 MG tablet TAKE 1 TABLET (100 MG TOTAL) BY MOUTH AS NEEDED FOR MIGRAINE. MAY REPEAT IN 2 HOURS IF NEEDED 07/03/24   Lowne Chase, Yvonne R, DO  SYMBICORT  80-4.5 MCG/ACT inhaler INHALE 1 PUFF INTO THE LUNGS IN THE MORNING AND AT BEDTIME. 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   Social Drivers of Corporate Investment Banker Strain: Low Risk  (03/21/2024)   Overall Financial Resource Strain (CARDIA)     Difficulty of Paying Living Expenses: Not hard at all  Food Insecurity: No Food Insecurity (03/21/2024)   Hunger Vital Sign    Worried About Running Out of Food in the Last Year: Never true    Ran Out of Food in the Last Year: Never true  Transportation Needs: No Transportation Needs (03/21/2024)   PRAPARE - Administrator, Civil Service (Medical): No    Lack of Transportation (Non-Medical): No  Physical Activity: Sufficiently  Active (03/21/2024)   Exercise Vital Sign    Days of Exercise per Week: 5 days    Minutes of Exercise per Session: 30 min  Stress: Stress Concern Present (03/21/2024)   [employer] of Occupational Health - Occupational Stress Questionnaire    Feeling of Stress: To some extent  Social Connections: Socially Integrated (03/21/2024)   Social Connection and Isolation Panel    Frequency of Communication with Friends and Family: More than three times a week    Frequency of Social Gatherings with Friends and Family: Once a week    Attends Religious Services: More than 4 times per year    Active Member of [employer] or Organizations: Yes    Attends Banker Meetings: 1 to 4 times per year    Marital Status: Married  Catering Manager Violence: Not At Risk (02/21/2024)   Humiliation, Afraid, Rape, and Kick questionnaire    Fear of Current or Ex-Partner: No    Emotionally Abused: No    Physically Abused: No    Sexually Abused: No     Family History:   The patient's family history includes Arthritis in her father; Dementia in her mother; Diabetes in her father; Hypertension in her father and mother; Melanoma in her brother; Prostate cancer in her father. There is no history of Colon cancer, Colon polyps, Esophageal cancer, Rectal cancer, or Stomach cancer.    ROS:  Please see the history of present illness.  All other ROS reviewed and negative.     Physical Exam/Data: Vitals:   08/13/24 1633 08/13/24 1637 08/13/24 1645 08/13/24 1734  BP: (!)  185/113 (!) 185/123 (!) 136/94   Pulse: 75 69 79   Resp: (!) 26 (!) 21 (!) 23   Temp:  97.9 F (36.6 C)    TempSrc:  Axillary    SpO2: 100% 99% 99% 99%   No intake or output data in the 24 hours ending 08/13/24 1737    07/05/2024    1:47 PM 05/09/2024    9:11 AM 05/07/2024   11:24 AM  Last 3 Weights  Weight (lbs) 154 lb 8 oz 152 lb 12.8 oz 152 lb 6.4 oz  Weight (kg) 70.081 kg 69.31 kg 69.128 kg     There is no height or weight on file to calculate BMI.  General: Well developed, well nourished, appears very uncomfortable and diaphoretic Head: Normocephalic, atraumatic, sclera non-icteric, no xanthomas, nares are without discharge. Neck: Negative for carotid bruits. JVP not elevated. Lungs: Clear bilaterally to auscultation without wheezes, rales, or rhonchi. Breathing is unlabored. Heart: RRR S1 S2 without murmurs, rubs, or gallops.  Abdomen: Soft, non-tender, non-distended with normoactive bowel sounds. No rebound/guarding. Extremities: No clubbing or cyanosis. No edema. Distal pedal pulses are 2+ and equal bilaterally. Neuro: Alert and oriented X 3. Moves all extremities spontaneously. Psych: Normal affect appropriate to severity of situation   EKG:  The ECG that was done today was personally reviewed and demonstrates NSR 73bpm with baseline wander/artifact but clear ST elevation inferiorly as well as V4-V6 p to 3mm in lead III, reciprocal changes noted avL, V2.  Relevant CV Studies: None  Laboratory Data: High Sensitivity Troponin:  No results for input(s): TROPONINIHS in the last 720 hours.    Chemistry Recent Labs  Lab 08/13/24 1633  NA 138  K 4.1  CL 100  CO2 24  GLUCOSE 139*  BUN 20  CREATININE 0.83  CALCIUM 9.7  GFRNONAA >60  ANIONGAP 15    Recent Labs  Lab 08/13/24 1633  PROT 7.3  ALBUMIN 4.6  AST 22  ALT <5  ALKPHOS 92  BILITOT 0.4   Lipids No results for input(s): CHOL, TRIG, HDL, LABVLDL, LDLCALC, CHOLHDL in the last 168  hours. Hematology Recent Labs  Lab 08/13/24 1633  WBC 9.8  RBC 4.76  HGB 14.9  HCT 44.9  MCV 94.3  MCH 31.3  MCHC 33.2  RDW 13.1  PLT 280   Thyroid  No results for input(s): TSH, FREET4 in the last 168 hours. BNPNo results for input(s): BNP, PROBNP in the last 168 hours.  DDimer No results for input(s): DDIMER in the last 168 hours.  Radiology/Studies:  DG Chest Port 1 View Result Date: 08/13/2024 CLINICAL DATA:  Chest pain. EXAM: PORTABLE CHEST 1 VIEW COMPARISON:  Chest radiograph dated 11/07/2019. FINDINGS: The heart size and mediastinal contours are within normal limits. Both lungs are clear. The visualized skeletal structures are unremarkable. IMPRESSION: No active disease. Electronically Signed   By: Vanetta Chou M.D.   On: 08/13/2024 16:54     Assessment and Plan:  1. Acute STEMI with changes inferolaterally - patient received ASA 325mg , heparin, morphine, zofran , NTG and taken directly to cardiac catheterization lab for further management - further plans TBD based on catheterization results - plan echocardiogram during admission  2. Essential HTN with markedly elevated BP on arrival - management post cath depending on findings of evaluation  3. H/o GERD - anticipate continuation of PPI therapy not to conflict with chosen antiplatelet  4. Migraines - would avoid triptans going froward  Risk Assessment/Risk Scores:   TIMI Risk Score for ST  Elevation MI:   The patient's TIMI risk score is 3, which indicates a 4.4% risk of all cause mortality at 30 days.   Code Status: Full Code  Severity of Illness: The appropriate patient status for this patient is INPATIENT. Inpatient status is judged to be reasonable and necessary in order to provide the required intensity of service to ensure the patient's safety. The patient's presenting symptoms, physical exam findings, and initial radiographic and laboratory data in the context of their chronic comorbidities  is felt to place them at high risk for further clinical deterioration. Furthermore, it is not anticipated that the patient will be medically stable for discharge from the hospital within 2 midnights of admission.   * I certify that at the point of admission it is my clinical judgment that the patient will require inpatient hospital care spanning beyond 2 midnights from the point of admission due to high intensity of service, high risk for further deterioration and high frequency of surveillance required.*  For questions or updates, please contact [facility] Please consult [email or web address] for contact info under       Signed, Dayna N Dunn, PA-C  08/13/2024 5:37 PM

## 2024-08-13 NOTE — ED Notes (Signed)
 EDP at bedside

## 2024-08-13 NOTE — ED Notes (Signed)
 Cath Lab notified @ [phone] for STEMI

## 2024-08-13 NOTE — ED Notes (Signed)
 Pt reports taking 1 regular aspirin at home, appx 1530.

## 2024-08-13 NOTE — Progress Notes (Signed)
 Per Dr. Genice request, ordered lactate with labs and s/o to oncoming fellow to re-evaluate patient this evening to ensure improving/stable.

## 2024-08-14 ENCOUNTER — Inpatient Hospital Stay (HOSPITAL_COMMUNITY)

## 2024-08-14 ENCOUNTER — Encounter (HOSPITAL_COMMUNITY): Payer: Self-pay | Admitting: Cardiology

## 2024-08-14 DIAGNOSIS — I2119 ST elevation (STEMI) myocardial infarction involving other coronary artery of inferior wall: Secondary | ICD-10-CM

## 2024-08-14 LAB — BASIC METABOLIC PANEL WITH GFR
Anion gap: 13 (ref 5–15)
BUN: 14 mg/dL (ref 8–23)
CO2: 23 mmol/L (ref 22–32)
Calcium: 8.8 mg/dL — ABNORMAL LOW (ref 8.9–10.3)
Chloride: 102 mmol/L (ref 98–111)
Creatinine, Ser: 0.86 mg/dL (ref 0.44–1.00)
GFR, Estimated: >60 mL/min (ref >60)
Glucose, Bld: 131 mg/dL — ABNORMAL HIGH (ref 70–99)
Potassium: 4 mmol/L (ref 3.5–5.1)
Sodium: 138 mmol/L (ref 135–145)

## 2024-08-14 LAB — TROPONIN I (HIGH SENSITIVITY)
Troponin I (High Sensitivity): >24000 ng/L (ref <18)
Troponin I (High Sensitivity): >24000 ng/L (ref <18)

## 2024-08-14 LAB — ECHOCARDIOGRAM COMPLETE
Area-P 1/2: 3.99 cm2
S' Lateral: 2.5 cm
Weight: 2476.21 [oz_av]

## 2024-08-14 LAB — CBC
HCT: 43.7 % (ref 36.0–46.0)
Hemoglobin: 14.5 g/dL (ref 12.0–15.0)
MCH: 31.5 pg (ref 26.0–34.0)
MCHC: 33.2 g/dL (ref 30.0–36.0)
MCV: 94.8 fL (ref 80.0–100.0)
Platelets: 257 K/uL (ref 150–400)
RBC: 4.61 MIL/uL (ref 3.87–5.11)
RDW: 13.2 % (ref 11.5–15.5)
WBC: 13.4 K/uL — ABNORMAL HIGH (ref 4.0–10.5)
nRBC: 0 % (ref 0.0–0.2)

## 2024-08-14 LAB — LACTIC ACID, PLASMA: Lactic Acid, Venous: 1.8 mmol/L (ref 0.5–1.9)

## 2024-08-14 LAB — MAGNESIUM: Magnesium: 2.3 mg/dL (ref 1.7–2.4)

## 2024-08-14 LAB — HEPARIN LEVEL (UNFRACTIONATED)
Heparin Unfractionated: 0.12 [IU]/mL — ABNORMAL LOW (ref 0.30–0.70)
Heparin Unfractionated: 0.52 [IU]/mL (ref 0.30–0.70)

## 2024-08-14 MED ORDER — PROMETHAZINE HCL 12.5 MG PO TABS
12.5000 mg | ORAL_TABLET | Freq: Four times a day (QID) | ORAL | Status: DC | PRN
  Administered 2024-08-14 – 2024-08-15 (×2): 12.5 mg via ORAL
  Filled 2024-08-14 (×4): qty 1

## 2024-08-14 MED ORDER — LOSARTAN POTASSIUM 25 MG PO TABS
25.0000 mg | ORAL_TABLET | Freq: Every day | ORAL | Status: DC
  Administered 2024-08-14: 25 mg via ORAL
  Filled 2024-08-14: qty 1

## 2024-08-14 MED ORDER — PROMETHAZINE HCL 12.5 MG PO TABS
12.5000 mg | ORAL_TABLET | Freq: Once | ORAL | Status: AC | PRN
  Administered 2024-08-14: 12.5 mg via ORAL
  Filled 2024-08-14: qty 1

## 2024-08-14 MED ORDER — BISOPROLOL FUMARATE 5 MG PO TABS
2.5000 mg | ORAL_TABLET | Freq: Every day | ORAL | Status: DC
  Administered 2024-08-14 – 2024-08-16 (×3): 2.5 mg via ORAL
  Filled 2024-08-14: qty 0.5
  Filled 2024-08-14: qty 1
  Filled 2024-08-14: qty 0.5

## 2024-08-14 MED ORDER — HYDROMORPHONE HCL 1 MG/ML IJ SOLN
0.5000 mg | INTRAMUSCULAR | Status: DC | PRN
  Administered 2024-08-14 – 2024-08-15 (×5): 0.5 mg via INTRAVENOUS
  Filled 2024-08-14 (×5): qty 0.5

## 2024-08-14 MED ORDER — ACETAMINOPHEN 10 MG/ML IV SOLN
1000.0000 mg | Freq: Four times a day (QID) | INTRAVENOUS | Status: AC | PRN
  Administered 2024-08-14 (×3): 1000 mg via INTRAVENOUS
  Filled 2024-08-14 (×3): qty 100

## 2024-08-14 MED ORDER — CHLORHEXIDINE GLUCONATE CLOTH 2 % EX PADS
6.0000 | MEDICATED_PAD | Freq: Every day | CUTANEOUS | Status: DC
  Administered 2024-08-14 – 2024-08-16 (×3): 6 via TOPICAL

## 2024-08-14 MED ORDER — LORAZEPAM 2 MG/ML IJ SOLN
0.5000 mg | Freq: Once | INTRAMUSCULAR | Status: AC
  Administered 2024-08-14: 0.5 mg via INTRAVENOUS
  Filled 2024-08-14: qty 1

## 2024-08-14 MED ORDER — KETOROLAC TROMETHAMINE 15 MG/ML IJ SOLN
15.0000 mg | Freq: Four times a day (QID) | INTRAMUSCULAR | Status: DC | PRN
  Administered 2024-08-14 – 2024-08-15 (×2): 15 mg via INTRAVENOUS
  Filled 2024-08-14 (×2): qty 1

## 2024-08-14 MED ORDER — PROMETHAZINE HCL 12.5 MG PO TABS: 12.5000 mg | ORAL_TABLET | Freq: Four times a day (QID) | ORAL | Status: DC | PRN

## 2024-08-14 NOTE — Progress Notes (Signed)
 PHARMACY - ANTICOAGULATION CONSULT NOTE  Pharmacy Consult for Heparin infusion Indication: chest pain/ACS  Allergies  Allergen Reactions   Avapro  [Irbesartan ] Cough   Codeine Other (See Comments)    Makes pt hyper    Metoprolol  Cough    Metallic taste , nasal drainage    Norvasc  [Amlodipine  Besylate] Swelling    Patient Measurements: Weight: 70.2 kg (154 lb 12.2 oz)  Vital Signs: Temp: 97 F (36.1 C) (11/12 0801) Temp Source: Oral (11/12 0801) BP: 151/88 (11/12 1000) Pulse Rate: 66 (11/12 1000)  Labs: Recent Labs    08/13/24 1633 08/13/24 1941 08/13/24 2254 08/14/24 0216 08/14/24 0958  HGB 14.9  --   --  14.5  --   HCT 44.9  --   --  43.7  --   PLT 280  --   --  257  --   APTT 27  --   --   --   --   LABPROT 12.6  --   --   --   --   INR 0.9  --   --   --   --   HEPARINUNFRC  --   --   --   --  0.12*  CREATININE 0.83  --   --  0.86  --   TROPONINIHS  --  1,326* 19,857* >24,000*  --     Estimated Creatinine Clearance: 54.3 mL/min (by C-G formula based on SCr of 0.86 mg/dL).   Medical History: Past Medical History:  Diagnosis Date   Acute pharyngitis 02/26/2009   Qualifier: Diagnosis of  By: Antonio ROSALEA Rockers     Arthritis    hands and neck    Asthma    Essential hypertension 02/07/2007   Qualifier: Diagnosis of  By: Antonio ROSALEA Rockers Britt)    HTN (hypertension)    Insomnia 12/23/2017   Lower extremity edema 07/31/2017   Migraine 05/23/2017   Pneumonia    SINUSITIS - ACUTE-NOS 03/17/2009   Qualifier: Diagnosis of  By: Antonio ROSALEA Rockers     Skin cancer    basal cell   SKIN CANCER, HX OF 01/08/2008   Annotation: BSC and SCC Qualifier: Diagnosis of  By: Antonio ROSALEA Rockers   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 Amy Jordan    URI 02/07/2007   Qualifier: Diagnosis of  By: Antonio ROSALEA Rockers      Medications:  Infusions:   sodium chloride  15 mL/hr at 08/13/24 1720   sodium chloride      acetaminophen  1,000 mg  (08/14/24 0901)   heparin 800 Units/hr (08/14/24 0800)    Assessment: 77 YOF STEMI alert. Patient received 4000 units heparin prior to arrival and additional 4000 units in cath lab. Patient was not on anticoagulation prior to arrival. Patient received cangrelor load and ~2 hour infusion as well as ticagrelor load. Plan to initiate heparin 8 hours after sheath removal and continue for 48 hours.  Heparin level is subtherapeutic this morning at 0.12. Will increase and recheck level. CBC stable: Hgb 14s, Plt 257. No signs s/x bleeding or interruptions to the infusion per RN.  Goal of Therapy:  Heparin level 0.3-0.7 units/ml Monitor platelets by anticoagulation protocol: Yes   Plan:  Increase heparin infusion to 950 units/hr Check anti-Xa level in 6 hours and daily while on heparin Continue to monitor H&H and platelets  Elma Fail, PharmD PGY1 Clinical Pharmacist [facility]  08/14/2024 10:56 AM

## 2024-08-14 NOTE — Progress Notes (Signed)
 Patient ID: Laura Hopkins, female   DOB: 04-17-1946, 78 y.o.   MRN: [id number]     Advanced Heart Failure Rounding Note  Cardiologist: None  Chief Complaint: STEMI Subjective:    Patient still has chest pain, has migrated laterally in left chest.  Still has jaw pain.  ECG shows resolution of inferolateral ST elevation.   Lactate initially 2.9 but patient is stable off pressors/inotropes with elevated BP.    Heparin gtt ongoing.  She reports severe headache, typical for her migraines.  Also with nausea.  Has had multiple pain and nausea meds without relief.  Head CT yesterday with no acute findings.   She had an episode of SVT earlier this morning.   I reviewed the echo, EF 55-60% with mid inferolateral and mid anterolateral hypokinesis, normal RV size and systolic function.    Objective:   Weight Range: 70.2 kg Body mass index is 26.57 kg/m.   Vital Signs:   Temp:  [97 F (36.1 C)-99.1 F (37.3 C)] 97 F (36.1 C) (11/12 0801) Pulse Rate:  [0-93] 75 (11/12 0730) Resp:  [11-26] 14 (11/12 0730) BP: (131-185)/(77-123) 143/88 (11/12 0700) SpO2:  [92 %-100 %] 98 % (11/12 0730) Weight:  [70 kg-70.2 kg] 70.2 kg (11/12 0500)    Weight change: Filed Weights   08/13/24 1745 08/14/24 0500  Weight: 70 kg 70.2 kg    Intake/Output:   Intake/Output Summary (Last 24 hours) at 08/14/2024 0850 Last data filed at 08/14/2024 0800 Gross per 24 hour  Intake 1036.58 ml  Output --  Net 1036.58 ml      Physical Exam    General: NAD, appears uncomfortable.  Neck: No JVD, no thyromegaly or thyroid  nodule.  Lungs: Clear to auscultation bilaterally with normal respiratory effort. CV: Nondisplaced PMI.  Heart regular S1/S2, no S3/S4, no murmur.  No peripheral edema.  Abdomen: Soft, nontender, no hepatosplenomegaly, no distention.  Skin: Intact without lesions or rashes.  Neurologic: Alert and oriented x 3.  Psych: Normal affect. Extremities: No clubbing or cyanosis.  HEENT:  Normal.   Telemetry   NSR 60s, episode of SVT earlier today.  Personally reviewed  EKG    NSR, resolution of inferior and inferolateral STE.   Labs    CBC Recent Labs    08/13/24 1633 08/14/24 0216  WBC 9.8 13.4*  NEUTROABS 5.7  --   HGB 14.9 14.5  HCT 44.9 43.7  MCV 94.3 94.8  PLT 280 257   Basic Metabolic Panel Recent Labs    88/88/74 1633 08/14/24 0216  NA 138 138  K 4.1 4.0  CL 100 102  CO2 24 23  GLUCOSE 139* 131*  BUN 20 14  CREATININE 0.83 0.86  CALCIUM 9.7 8.8*   Liver Function Tests Recent Labs    08/13/24 1633  AST 22  ALT <5  ALKPHOS 92  BILITOT 0.4  PROT 7.3  ALBUMIN 4.6   No results for input(s): LIPASE, AMYLASE in the last 72 hours. Cardiac Enzymes No results for input(s): CKTOTAL, CKMB, CKMBINDEX, TROPONINI in the last 72 hours.  BNP: BNP (last 3 results) No results for input(s): BNP in the last 8760 hours.  ProBNP (last 3 results) No results for input(s): PROBNP in the last 8760 hours.   D-Dimer No results for input(s): DDIMER in the last 72 hours. Hemoglobin A1C Recent Labs    08/13/24 1633  HGBA1C 5.2   Fasting Lipid Panel Recent Labs    08/13/24 1633  CHOL 182  HDL 67  LDLCALC 83  TRIG 158*  CHOLHDL 2.7   Thyroid  Function Tests No results for input(s): TSH, T4TOTAL, T3FREE, THYROIDAB in the last 72 hours.  Invalid input(s): FREET3  Other results:   Imaging    CT HEAD WO CONTRAST ( ) Result Date: 08/14/2024 CLINICAL DATA:  Initial evaluation for acute headache, classic migraine. EXAM: CT HEAD WITHOUT CONTRAST TECHNIQUE: Contiguous axial images were obtained from the base of the skull through the vertex without intravenous contrast. RADIATION DOSE REDUCTION: This exam was performed according to the departmental dose-optimization program which includes automated exposure control, adjustment of the mA and/or kV according to patient size and/or use of iterative reconstruction  technique. COMPARISON:  Comparison made with MRI from 01/14/2023. FINDINGS: Brain: Cerebral volume within normal limits. Patchy hypodensity involving the supratentorial cerebral white matter, most characteristic of chronic microvascular ischemic disease, moderate in nature. No acute intracranial hemorrhage. No acute large vessel territory infarct. No mass lesion, mass effect or midline shift. No hydrocephalus or extra-axial fluid collection. Vascular: No abnormal hyperdense vessel. Scattered vascular calcifications noted within the carotid siphons. Skull: Scalp soft tissues within normal limits.  Calvarium intact. Sinuses/Orbits: Globes and orbital soft tissues are within normal limits. Mild mucoperiosteal thickening present about the left sphenoid sinus. Paranasal sinuses are otherwise clear. No mastoid effusion. Other: None. IMPRESSION: 1. No acute intracranial abnormality. 2. Moderate chronic microvascular ischemic disease. Electronically Signed   By: Morene Hoard M.D.   On: 08/14/2024 03:41   CARDIAC CATHETERIZATION Result Date: 08/13/2024 Images from the original result were not included.   Dist Cx lesion is 100% stenosed with 100% stenosed side branch in 4th Mrg.  TIMI 0 flow last Peroneal   Balloon angioplasty was performed using a BALLOON EMERGE MR 2.0X12.   Post intervention, there is a 30% residual stenosis. Post intervention, the side branch was reduced to 50% residual stenosis.  TIMI I-II flow up to the next occlusion site restored.   4th Mrg (extension of distal LCx) lesion is 100% stenosed.-Likely distal normalization from the original infarction rating   At this point, decision making with Dr. Verlin was to accept borderline successful PTCA in order to avoid potential dangerous perforation.  Very distal to the wire this vessel distally.  Allow time for IV medications to work.   ---------------------------------------   Otherwise normal LAD with small diagonals, LCx with 3 other OM's and  RCA with PDA and 3 PL's.   LV end diastolic pressure is severely elevated.  There is no aortic valve stenosis. Diagnostic: Dominance: Right     Intervention. Acute Inferolateral STEMI involving the distal LCx and a very tortuous vessel: 100% occlusion after major bend in the takeoff of a small sidebranch. => Minimally successful PTCA restoring flow to another 40 mm of the vessel but still no reflow distally despite IC NTG and angioplasty with small pressures. Due to the tortuosity, I opted to forego further treatment and left balloon and plasty result only. LV gram not performed-concern due to LVEDP estimated 25 mmHg.  Plan: Admit to ICU -continue cangrelor until current bag complete.  Would load with Brilinta 180 mg prior to completion of cangrelor. Initiate heparin 8 hours after sheath removal = plan would be to continue 48 hours of IV heparin IV analgesics ordered for pain control given the ongoing chest pain. Will check 2D Echo. Multiple different medication intolerances, will start low-dose irbesartan  and consider bisoprolol. Alm Clay, MD   DG Chest Port 1 View Result Date: 08/13/2024 CLINICAL DATA:  Chest pain.  EXAM: PORTABLE CHEST 1 VIEW COMPARISON:  Chest radiograph dated 11/07/2019. FINDINGS: The heart size and mediastinal contours are within normal limits. Both lungs are clear. The visualized skeletal structures are unremarkable. IMPRESSION: No active disease. Electronically Signed   By: Vanetta Chou M.D.   On: 08/13/2024 16:54     Medications:     Scheduled Medications:  aspirin  81 mg Oral Daily   atorvastatin  80 mg Oral Daily   bisoprolol  2.5 mg Oral Daily   famotidine   20 mg Oral QHS   fluticasone furoate-vilanterol  1 puff Inhalation Daily   losartan  25 mg Oral Daily   pantoprazole   40 mg Oral QPM   sodium chloride  flush  3 mL Intravenous Q12H   sucralfate   1 g Oral TID WC & HS   ticagrelor  90 mg Oral BID    Infusions:  sodium chloride  15 mL/hr at 08/13/24 1720    sodium chloride      acetaminophen  Stopped (08/14/24 0256)   heparin 800 Units/hr (08/14/24 0800)    PRN Medications: sodium chloride , acetaminophen , HYDROmorphone (DILAUDID) injection, morphine injection, ondansetron  (ZOFRAN ) IV, oxyCODONE, promethazine , sodium chloride  flush, zolpidem    Assessment/Plan   1. CAD: Acute inferolateral STEMI from occlusion of distal LCx. PTCA to site of occlusion, but further distally the vessel remained occluded.  She still has some chest pain but ECG changes have resolved. Echo today showed  EF 55-60% with mid inferolateral and mid anterolateral hypokinesis, normal RV size and systolic function. - Continue ASA 81 and ticagrelor.  - Will continue heparin gtt x 48 hrs with residual thrombotic occlusion in far distal LCx and ongoing CP.  - Atorvastatin 80 daily.  - Add losartan 25 mg daily.  - Add bisoprolol 2.5 daily (did not tolerate Toprol  XL in past) given SVT.  2. SVT: Run of SVT, starting bisoprolol.  3. Migraine headache: Very severe, typical migraine for her.  Associated with nausea. Head CT unremarkable.  - Add IV Dilaudid.  - Will not be able to take triptans in future with MI.   CRITICAL CARE Performed by: Ezra Shuck  Total critical care time: 35 minutes  Critical care time was exclusive of separately billable procedures and treating other patients.  Critical care was necessary to treat or prevent imminent or life-threatening deterioration.  Critical care was time spent personally by me on the following activities: development of treatment plan with patient and/or surrogate as well as nursing, discussions with consultants, evaluation of patient's response to treatment, examination of patient, obtaining history from patient or surrogate, ordering and performing treatments and interventions, ordering and review of laboratory studies, ordering and review of radiographic studies, pulse oximetry and re-evaluation of patient's  condition.   Length of Stay: 1  Ezra Shuck, MD  08/14/2024, 8:50 AM  Advanced Heart Failure Team Pager [phone] (M-F; 7a - 5p)  Please contact [facility] for night-coverage after hours (5p -7a ) and weekends on [email or web address]

## 2024-08-14 NOTE — Progress Notes (Signed)
 PHARMACY - ANTICOAGULATION CONSULT NOTE  Pharmacy Consult for Heparin infusion Indication: chest pain/ACS  Allergies  Allergen Reactions   Avapro  [Irbesartan ] Cough   Codeine Other (See Comments)    Makes pt hyper    Metoprolol  Cough    Metallic taste , nasal drainage    Norvasc  [Amlodipine  Besylate] Swelling    Patient Measurements: Weight: 70.2 kg (154 lb 12.2 oz)  Vital Signs: Temp: 98.7 F (37.1 C) (11/12 1626) Temp Source: Oral (11/12 1626) BP: 156/93 (11/12 1700) Pulse Rate: 61 (11/12 1700)  Labs: Recent Labs    08/13/24 1633 08/13/24 1941 08/13/24 2254 08/14/24 0216 08/14/24 0958 08/14/24 1429 08/14/24 1806  HGB 14.9  --   --  14.5  --   --   --   HCT 44.9  --   --  43.7  --   --   --   PLT 280  --   --  257  --   --   --   APTT 27  --   --   --   --   --   --   LABPROT 12.6  --   --   --   --   --   --   INR 0.9  --   --   --   --   --   --   HEPARINUNFRC  --   --   --   --  0.12*  --  0.52  CREATININE 0.83  --   --  0.86  --   --   --   TROPONINIHS  --    < > 19,857* >24,000*  --  >24,000*  --    < > = values in this interval not displayed.    Estimated Creatinine Clearance: 54.3 mL/min (by C-G formula based on SCr of 0.86 mg/dL).   Medical History: Past Medical History:  Diagnosis Date   Acute pharyngitis 02/26/2009   Qualifier: Diagnosis of  By: Antonio ROSALEA Rockers     Arthritis    hands and neck    Asthma    Essential hypertension 02/07/2007   Qualifier: Diagnosis of  By: Antonio ROSALEA Rockers Britt)    HTN (hypertension)    Insomnia 12/23/2017   Lower extremity edema 07/31/2017   Migraine 05/23/2017   Pneumonia    SINUSITIS - ACUTE-NOS 03/17/2009   Qualifier: Diagnosis of  By: Antonio ROSALEA Rockers     Skin cancer    basal cell   SKIN CANCER, HX OF 01/08/2008   Annotation: BSC and SCC Qualifier: Diagnosis of  By: Antonio ROSALEA Rockers   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 Amy Jordan    URI 02/07/2007    Qualifier: Diagnosis of  By: Antonio ROSALEA Rockers      Medications:  Infusions:   sodium chloride      acetaminophen  1,000 mg (08/14/24 1602)   heparin 950 Units/hr (08/14/24 1109)    Assessment: 77 YOF STEMI alert. Patient received 4000 units heparin prior to arrival and additional 4000 units in cath lab. Patient was not on anticoagulation prior to arrival. Patient received cangrelor load and ~2 hour infusion as well as ticagrelor load. Plan to initiate heparin 8 hours after sheath removal and continue for 48 hours.  11/12 PM: Heparin level therapeutic. No issues with infusion or bleeding noted. Continue current rate  Goal of Therapy:  Heparin level 0.3-0.7 units/ml Monitor platelets by anticoagulation protocol: Yes   Plan:  Continue heparin infusion at 950 units/hr Check anti-Xa level with AM labs and daily while on heparin Continue to monitor H&H and platelets  Larraine Brazier, PharmD Clinical Pharmacist 08/14/2024  7:11 PM **Pharmacist phone directory can now be found on [email or web address] ([email or web address]).  Listed under [facility].

## 2024-08-14 NOTE — TOC Initial Note (Signed)
 Transition of Care [facility]) - Initial/Assessment Note    Patient Details  Name: Laura Hopkins MRN: [id number] Date of Birth: 17-Mar-1946  [facility] CM/SW Contact:    Arlana JINNY Nicholaus ISRA[place] Phone Number: [phone] 08/14/2024, 10:11 AM  Clinical Narrative:     HF CSW met with patient and son Redell at bedside. Patient was lethargic and son answered questions. Redell stated that the patient lives at home with spouse. Redell stated that the patient has no history of HH services. Redell stated that she uses a BP monitor. Redell stated that she patient has a scale at home. Redell stated that the patient has a PCP. Patients son stated that they will request their PCP. Patients husband will provide transportation at dc.  HF CSW/CM will continue to follow and monitor for dc readiness.                     Patient Goals and CMS Choice            Expected Discharge Plan and Services                                              Prior Living Arrangements/Services                       Activities of Daily Living      Permission Sought/Granted                  Emotional Assessment              Admission diagnosis:  ST elevation myocardial infarction (STEMI), unspecified artery (HCC) [I21.3] Acute ST elevation myocardial infarction (STEMI) of inferolateral wall (HCC) [I21.19] Patient Active Problem List   Diagnosis Date Noted   Acute ST elevation myocardial infarction (STEMI) of inferolateral wall (HCC) 08/13/2024   Nausea 07/05/2024   Depression with anxiety 08/18/2023   Cervical radiculopathy 01/09/2023   Seasonal allergies 07/12/2021   Preoperative examination 04/30/2021   External hemorrhoid 09/15/2020   Acute vaginitis 09/15/2020   Colitis 09/15/2020   Depression, major, single episode, mild 03/23/2020   History of chronic cough 03/23/2020   OSA on CPAP 03/23/2020   Sleep related choking sensation 01/26/2020   Hyperlipidemia  12/26/2019   Diffuse wheezing 12/24/2019   Snoring 12/24/2019   Gastroesophageal reflux disease without esophagitis 12/24/2019   Coughing 12/24/2019   Bronchitis 11/06/2019   SOB (shortness of breath) 03/11/2019   Insomnia 12/23/2017   Lower extremity edema 07/31/2017   Migraine 05/23/2017   SINUSITIS - ACUTE-NOS 03/17/2009   ACUTE PHARYNGITIS 02/26/2009   SKIN CANCER, HX OF 01/08/2008   Essential hypertension 02/07/2007   URI 02/07/2007   Headache 02/07/2007   PCP:  Antonio Cyndee Jamee JONELLE, DO Pharmacy:   [id number] [address] [place], [place] - [address] [place] [address] Phone: [phone] Fax: [phone]  [id number] [place], [place] - [address] AT [facility] [address] [place] [address] Phone: [phone] Fax: [phone]     Social Drivers of Health (SDOH) Social History: SDOH Screenings   Food Insecurity: No Food Insecurity (03/21/2024)  Housing: Unknown (03/21/2024)  Transportation Needs: No Transportation Needs (03/21/2024)  Utilities: Not At Risk (02/21/2024)  Alcohol Screen: Low Risk  (02/21/2024)  Depression (PHQ2-9): Medium Risk (  02/21/2024)  Financial Resource Strain: Low Risk  (03/21/2024)  Physical Activity: Sufficiently Active (03/21/2024)  Social Connections: Socially Integrated (03/21/2024)  Stress: Stress Concern Present (03/21/2024)  Tobacco Use: Medium Risk (08/13/2024)  Health Literacy: Adequate Health Literacy (02/21/2024)   SDOH Interventions:     Readmission Risk Interventions     No data to display

## 2024-08-14 NOTE — Progress Notes (Signed)
 Pt unable to listen to education at this time. Gave MI book, exercise guidelines, diet sheet, and brochure for [facility] CRPII. Will f/u tomorrow.  [phone] Aliene Aris BS, ACSM-CEP 08/14/2024 3:23 PM

## 2024-08-15 ENCOUNTER — Other Ambulatory Visit (HOSPITAL_COMMUNITY): Payer: Self-pay

## 2024-08-15 ENCOUNTER — Telehealth (HOSPITAL_COMMUNITY): Payer: Self-pay | Admitting: Pharmacy Technician

## 2024-08-15 DIAGNOSIS — I2119 ST elevation (STEMI) myocardial infarction involving other coronary artery of inferior wall: Secondary | ICD-10-CM | POA: Diagnosis not present

## 2024-08-15 LAB — CBC
HCT: 42.4 % (ref 36.0–46.0)
Hemoglobin: 14.1 g/dL (ref 12.0–15.0)
MCH: 31.5 pg (ref 26.0–34.0)
MCHC: 33.3 g/dL (ref 30.0–36.0)
MCV: 94.9 fL (ref 80.0–100.0)
Platelets: 258 K/uL (ref 150–400)
RBC: 4.47 MIL/uL (ref 3.87–5.11)
RDW: 13.6 % (ref 11.5–15.5)
WBC: 12.6 K/uL — ABNORMAL HIGH (ref 4.0–10.5)
nRBC: 0 % (ref 0.0–0.2)

## 2024-08-15 LAB — BASIC METABOLIC PANEL WITH GFR
Anion gap: 9 (ref 5–15)
BUN: 12 mg/dL (ref 8–23)
CO2: 22 mmol/L (ref 22–32)
Calcium: 8.8 mg/dL — ABNORMAL LOW (ref 8.9–10.3)
Chloride: 100 mmol/L (ref 98–111)
Creatinine, Ser: 0.88 mg/dL (ref 0.44–1.00)
GFR, Estimated: >60 mL/min (ref >60)
Glucose, Bld: 103 mg/dL — ABNORMAL HIGH (ref 70–99)
Potassium: 3.9 mmol/L (ref 3.5–5.1)
Sodium: 131 mmol/L — ABNORMAL LOW (ref 135–145)

## 2024-08-15 LAB — HEPARIN LEVEL (UNFRACTIONATED): Heparin Unfractionated: 0.54 [IU]/mL (ref 0.30–0.70)

## 2024-08-15 LAB — LIPOPROTEIN A (LPA): Lipoprotein (a): 21.4 nmol/L (ref <75.0)

## 2024-08-15 MED ORDER — POLYETHYLENE GLYCOL 3350 17 G PO PACK
17.0000 g | PACK | Freq: Every day | ORAL | Status: DC
  Administered 2024-08-15 – 2024-08-16 (×2): 17 g via ORAL
  Filled 2024-08-15 (×2): qty 1

## 2024-08-15 MED ORDER — HYDROMORPHONE HCL 1 MG/ML IJ SOLN: 0.5000 mg | INTRAMUSCULAR | Status: DC | PRN

## 2024-08-15 MED ORDER — LOSARTAN POTASSIUM 50 MG PO TABS
50.0000 mg | ORAL_TABLET | Freq: Every day | ORAL | Status: DC
  Administered 2024-08-15 – 2024-08-16 (×2): 50 mg via ORAL
  Filled 2024-08-15: qty 1
  Filled 2024-08-15: qty 2

## 2024-08-15 NOTE — Progress Notes (Addendum)
 Patient ID: Laura Hopkins, female   DOB: 1946-06-27, 78 y.o.   MRN: [id number]     Advanced Heart Failure Rounding Note  Cardiologist: None  Chief Complaint: STEMI Subjective:    CP free. No dyspnea. No further SVT. C/w migraine HA but improved w/ pain meds. Ambulated yesterday w/o difficulty.   Echo EF 55-60% with mid inferolateral and mid anterolateral hypokinesis, normal RV size and systolic function.    Objective:   Weight Range: 69.4 kg Body mass index is 26.28 kg/m.   Vital Signs:   Temp:  [97 F (36.1 C)-98.7 F (37.1 C)] 98.3 F (36.8 C) (11/13 0342) Pulse Rate:  [53-75] 66 (11/13 0600) Resp:  [7-24] 23 (11/13 0600) BP: (98-180)/(72-101) 127/75 (11/13 0500) SpO2:  [90 %-98 %] 95 % (11/13 0600) Weight:  [69.4 kg] 69.4 kg (11/13 0600) Last BM Date : 08/13/24  Weight change: Filed Weights   08/13/24 1745 08/14/24 0500 08/15/24 0600  Weight: 70 kg 70.2 kg 69.4 kg    Intake/Output:   Intake/Output Summary (Last 24 hours) at 08/15/2024 0703 Last data filed at 08/15/2024 0535 Gross per 24 hour  Intake 433.22 ml  Output 1450 ml  Net -1016.78 ml      Physical Exam   GENERAL: NAD Lungs- clear  CARDIAC:  JVP not elevated.          Normal rate with regular rhythm. No murmur.  No LEE.  ABDOMEN: Soft, non-tender, non-distended.  EXTREMITIES: Warm and well perfused.  NEUROLOGIC: No obvious FND   Telemetry   NSR 60s, no further SVT  Personally reviewed  EKG   N/A   Labs    CBC Recent Labs    08/13/24 1633 08/14/24 0216 08/15/24 0220  WBC 9.8 13.4* 12.6*  NEUTROABS 5.7  --   --   HGB 14.9 14.5 14.1  HCT 44.9 43.7 42.4  MCV 94.3 94.8 94.9  PLT 280 257 258   Basic Metabolic Panel Recent Labs    88/87/74 0216 08/14/24 1429 08/15/24 0220  NA 138  --  131*  K 4.0  --  3.9  CL 102  --  100  CO2 23  --  22  GLUCOSE 131*  --  103*  BUN 14  --  12  CREATININE 0.86  --  0.88  CALCIUM 8.8*  --  8.8*  MG  --  2.3  --    Liver Function  Tests Recent Labs    08/13/24 1633  AST 22  ALT <5  ALKPHOS 92  BILITOT 0.4  PROT 7.3  ALBUMIN 4.6   No results for input(s): LIPASE, AMYLASE in the last 72 hours. Cardiac Enzymes No results for input(s): CKTOTAL, CKMB, CKMBINDEX, TROPONINI in the last 72 hours.  BNP: BNP (last 3 results) No results for input(s): BNP in the last 8760 hours.  ProBNP (last 3 results) No results for input(s): PROBNP in the last 8760 hours.   D-Dimer No results for input(s): DDIMER in the last 72 hours. Hemoglobin A1C Recent Labs    08/13/24 1633  HGBA1C 5.2   Fasting Lipid Panel Recent Labs    08/13/24 1633  CHOL 182  HDL 67  LDLCALC 83  TRIG 158*  CHOLHDL 2.7   Thyroid  Function Tests No results for input(s): TSH, T4TOTAL, T3FREE, THYROIDAB in the last 72 hours.  Invalid input(s): FREET3  Other results:   Imaging    ECHOCARDIOGRAM COMPLETE Result Date: 08/14/2024    ECHOCARDIOGRAM REPORT   Patient Name:  Laura Hopkins Date of Exam: 08/14/2024 Medical Rec #:  [id number]      Height:       64.0 in Accession #:    [id number]     Weight:       154.8 lb Date of Birth:  [birth year]     BSA:          1.754 m Patient Age:    77 years       BP:           143/88 mmHg Patient Gender: F              HR:           77 bpm. Exam Location:  Inpatient Procedure: 2D Echo, Cardiac Doppler and Color Doppler (Both Spectral and Color            Flow Doppler were utilized during procedure). Indications:    Myocardial Infact  History:        Patient has no prior history of Echocardiogram examinations.                 Signs/Symptoms:Shortness of Breath; Risk Factors:Hypertension                 and Sleep Apnea.  Sonographer:    Philomena Daring Referring Phys: [id number] DAVID W HARDING IMPRESSIONS  1. Mild inferolateral hypokinesis. Left ventricular ejection fraction, by estimation, is 55 to 60%. The left ventricle has normal function. The left ventricle demonstrates regional wall motion  abnormalities (see scoring diagram/findings for description). Left ventricular diastolic parameters were normal.  2. Right ventricular systolic function is normal. The right ventricular size is normal.  3. The mitral valve is normal in structure. No evidence of mitral valve regurgitation. No evidence of mitral stenosis.  4. The aortic valve is tricuspid. There is mild calcification of the aortic valve. Aortic valve regurgitation is not visualized. Aortic valve sclerosis is present, with no evidence of aortic valve stenosis.  5. The inferior vena cava is normal in size with greater than 50% respiratory variability, suggesting right atrial pressure of 3 mmHg. FINDINGS  Left Ventricle: Mild inferolateral hypokinesis. Left ventricular ejection fraction, by estimation, is 55 to 60%. The left ventricle has normal function. The left ventricle demonstrates regional wall motion abnormalities. The left ventricular internal cavity size was normal in size. There is no left ventricular hypertrophy. Left ventricular diastolic parameters were normal. Right Ventricle: The right ventricular size is normal. No increase in right ventricular wall thickness. Right ventricular systolic function is normal. Left Atrium: Left atrial size was normal in size. Right Atrium: Right atrial size was normal in size. Pericardium: There is no evidence of pericardial effusion. Mitral Valve: The mitral valve is normal in structure. No evidence of mitral valve regurgitation. No evidence of mitral valve stenosis. Tricuspid Valve: The tricuspid valve is normal in structure. Tricuspid valve regurgitation is trivial. No evidence of tricuspid stenosis. Aortic Valve: The aortic valve is tricuspid. There is mild calcification of the aortic valve. Aortic valve regurgitation is not visualized. Aortic valve sclerosis is present, with no evidence of aortic valve stenosis. Pulmonic Valve: The pulmonic valve was normal in structure. Pulmonic valve regurgitation is  not visualized. No evidence of pulmonic stenosis. Aorta: The aortic root is normal in size and structure. Venous: The inferior vena cava is normal in size with greater than 50% respiratory variability, suggesting right atrial pressure of 3 mmHg. IAS/Shunts: No atrial level shunt detected by color flow Doppler.  LEFT  VENTRICLE PLAX 2D LVIDd:         4.00 cm   Diastology LVIDs:         2.50 cm   LV e' medial:    8.27 cm/s LV PW:         0.90 cm   LV E/e' medial:  8.3 LV IVS:        0.90 cm   LV e' lateral:   11.90 cm/s LVOT diam:     1.90 cm   LV E/e' lateral: 5.8 LV SV:         58 LV SV Index:   33 LVOT Area:     2.84 cm LV IVRT:       81 msec  RIGHT VENTRICLE             IVC RV S prime:     14.30 cm/s  IVC diam: 1.20 cm TAPSE (M-mode): 2.3 cm LEFT ATRIUM             Index        RIGHT ATRIUM          Index LA diam:        2.80 cm 1.60 cm/m   RA Area:     9.61 cm LA Vol (A2C):   25.3 ml 14.42 ml/m  RA Volume:   17.20 ml 9.80 ml/m LA Vol (A4C):   25.0 ml 14.25 ml/m LA Biplane Vol: 26.7 ml 15.22 ml/m  AORTIC VALVE LVOT Vmax:   98.00 cm/s LVOT Vmean:  63.900 cm/s LVOT VTI:    0.204 m  AORTA Ao Root diam: 2.70 cm Ao Asc diam:  3.10 cm MITRAL VALVE               TRICUSPID VALVE MV Area (PHT): 3.99 cm    TR Peak grad:   6.2 mmHg MV Decel Time: 190 msec    TR Vmax:        124.00 cm/s MV E velocity: 68.70 cm/s MV A velocity: 80.70 cm/s  SHUNTS MV E/A ratio:  0.85        Systemic VTI:  0.20 m                            Systemic Diam: 1.90 cm Morene Brownie Electronically signed by Morene Brownie Signature Date/Time: 08/14/2024/4:05:37 PM    Final      Medications:     Scheduled Medications:  aspirin  81 mg Oral Daily   atorvastatin  80 mg Oral Daily   bisoprolol  2.5 mg Oral Daily   Chlorhexidine Gluconate Cloth  6 each Topical Daily   fluticasone furoate-vilanterol  1 puff Inhalation Daily   losartan  25 mg Oral Daily   sodium chloride  flush  3 mL Intravenous Q12H   sucralfate   1 g Oral TID WC & HS    ticagrelor  90 mg Oral BID    Infusions:  heparin 950 Units/hr (08/15/24 0521)    PRN Medications: HYDROmorphone (DILAUDID) injection, ketorolac , morphine injection, ondansetron  (ZOFRAN ) IV, oxyCODONE, promethazine , sodium chloride  flush, zolpidem    Assessment/Plan   1. CAD: Acute inferolateral STEMI from occlusion of distal LCx. PTCA to site of occlusion, but further distally the vessel remained occluded.  She still has some chest pain but ECG changes have resolved. Echo showed  EF 55-60% with mid inferolateral and mid anterolateral hypokinesis, normal RV size and systolic function. - Continue ASA 81 and ticagrelor.  -  Will continue heparin gtt x 48 hrs with residual thrombotic occlusion in far distal LCx and ongoing CP. Can stop early evening.  - Atorvastatin 80 daily.  - Continue losartan 25 mg daily.  - Continue bisoprolol 2.5 daily (did not tolerate Toprol  XL in past) given SVT.  2. SVT: Run of SVT yesterday. No further recurrence overnight  - continue bisoprolol.  3. Migraine headache: Very severe, typical migraine for her.  Associated with nausea. Head CT unremarkable. HA improving  - PRN IV Dilaudid.  - Will not be able to take triptans in future with MI.   Continue to ambulate w/ CR. Anticipate d/c in next 24 hr.    Length of Stay: 2  Caffie Shed, PA-C  08/15/2024, 7:03 AM  Advanced Heart Failure Team Pager [phone] (M-F; 7a - 5p)  Please contact [facility] for night-coverage after hours (5p -7a ) and weekends on [email or web address]   Patient seen with PA, I formulated the plan and agree with the above note.   No chest pain, remains on heparin gtt.  Headache improved with Dilaudid.  BP elevated.   General: NAD Neck: No JVD, no thyromegaly or thyroid  nodule.  Lungs: Clear to auscultation bilaterally with normal respiratory effort. CV: Nondisplaced PMI.  Heart regular S1/S2, no S3/S4, no murmur.  No peripheral edema.   Abdomen: Soft, nontender, no  hepatosplenomegaly, no distention.  Skin: Intact without lesions or rashes.  Neurologic: Alert and oriented x 3.  Psych: Normal affect. Extremities: No clubbing or cyanosis.  HEENT: Normal.   Continue heparin gtt 48 hrs then stop.  Continue ASA, ticagrelor, statin.   Preserved EF.  On bisoprolol and losartan for BP (has multiple med intolerances).  Will increase losartan to 50 mg daily today with high BP.   Cannot take triptans in future.  Her monthly injectable migraine med will be ok.    Ok for telemetry transfer, follow with Team C. Home most likely tomorrow.   Ezra Shuck 08/15/2024 8:10 AM

## 2024-08-15 NOTE — TOC Progression Note (Signed)
 Transition of Care [facility]) - Progression Note    Patient Details  Name: Laura Hopkins MRN: [id number] Date of Birth: 07-Aug-1946  [facility] CM/SW Contact  Justina Delcia Czar, RN Phone Number: [phone] 08/15/2024, 12:37 PM   Clinical Narrative:     PCP hospital follow up scheduled for 08/20/2024 at 11:40 am.   Benefits checked for Brillinta has a $0 copay  Expected Discharge Plan: Home/Self Care Barriers to Discharge: Continued Medical Work up    Expected Discharge Plan and Services   Discharge Planning Services: CM Consult   Living arrangements for the past 2 months: Single Family Home                  Social Drivers of Health (SDOH) Interventions SDOH Screenings   Food Insecurity: No Food Insecurity (08/15/2024)  Housing: Low Risk  (08/15/2024)  Transportation Needs: No Transportation Needs (08/15/2024)  Utilities: Not At Risk (08/15/2024)  Alcohol Screen: Low Risk  (02/21/2024)  Depression (PHQ2-9): Medium Risk (02/21/2024)  Financial Resource Strain: Low Risk  (03/21/2024)  Physical Activity: Sufficiently Active (03/21/2024)  Social Connections: Socially Integrated (08/15/2024)  Stress: Stress Concern Present (03/21/2024)  Tobacco Use: Medium Risk (08/13/2024)  Health Literacy: Adequate Health Literacy (02/21/2024)    Readmission Risk Interventions     No data to display

## 2024-08-15 NOTE — Progress Notes (Signed)
 Patient with transfer orders received at shift change. Report called at 2100 and transferred to the progressive unit. Patient stable, vital signs HR 59, RR 20 BP 127/82 O2 96% on room air. Pt resting comfortably in bed NSD and no complaints of pain.

## 2024-08-15 NOTE — Telephone Encounter (Signed)
 Patient Product/process Development Scientist completed.    The patient is insured through [employer]. Patient has Medicare and is not eligible for a copay card, but may be able to apply for patient assistance or Medicare RX Payment Plan (Patient Must reach out to their plan, if eligible for payment plan), if available.    Ran test claim for ticagrelor (Brilinta) 90 mg and the current 30 day co-pay is $0.00.   This test claim was processed through [facility]- copay amounts may vary at other pharmacies due to pharmacy/plan contracts, or as the patient moves through the different stages of their insurance plan.     Reyes Sharps, CPHT Pharmacy Technician Patient Advocate Specialist Lead [facility] Patient Advocate Team Direct Number: [phone]  Fax: [phone]

## 2024-08-15 NOTE — Progress Notes (Signed)
 PHARMACY - ANTICOAGULATION CONSULT NOTE  Pharmacy Consult for Heparin infusion Indication: chest pain/ACS  Allergies  Allergen Reactions   Avapro  [Irbesartan ] Cough   Codeine Other (See Comments)    Makes pt hyper    Metoprolol  Cough    Metallic taste , nasal drainage    Norvasc  [Amlodipine  Besylate] Swelling    Patient Measurements: Weight: 69.4 kg (153 lb 1.6 oz)  Vital Signs: Temp: 98.3 F (36.8 C) (11/13 0342) Temp Source: Axillary (11/13 0342) BP: 127/75 (11/13 0500) Pulse Rate: 66 (11/13 0600)  Labs: Recent Labs    08/13/24 1633 08/13/24 1941 08/13/24 2254 08/14/24 0216 08/14/24 0958 08/14/24 1429 08/14/24 1806 08/15/24 0220  HGB 14.9  --   --  14.5  --   --   --  14.1  HCT 44.9  --   --  43.7  --   --   --  42.4  PLT 280  --   --  257  --   --   --  258  APTT 27  --   --   --   --   --   --   --   LABPROT 12.6  --   --   --   --   --   --   --   INR 0.9  --   --   --   --   --   --   --   HEPARINUNFRC  --   --   --   --  0.12*  --  0.52 0.54  CREATININE 0.83  --   --  0.86  --   --   --  0.88  TROPONINIHS  --    < > 19,857* >24,000*  --  >24,000*  --   --    < > = values in this interval not displayed.    Estimated Creatinine Clearance: 52.8 mL/min (by C-G formula based on SCr of 0.88 mg/dL).   Medical History: Past Medical History:  Diagnosis Date   Acute pharyngitis 02/26/2009   Qualifier: Diagnosis of  By: Antonio ROSALEA Rockers     Arthritis    hands and neck    Asthma    Essential hypertension 02/07/2007   Qualifier: Diagnosis of  By: Antonio ROSALEA Rockers Britt)    HTN (hypertension)    Insomnia 12/23/2017   Lower extremity edema 07/31/2017   Migraine 05/23/2017   Pneumonia    SINUSITIS - ACUTE-NOS 03/17/2009   Qualifier: Diagnosis of  By: Antonio ROSALEA Rockers     Skin cancer    basal cell   SKIN CANCER, HX OF 01/08/2008   Annotation: BSC and SCC Qualifier: Diagnosis of  By: Antonio ROSALEA Rockers   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 Amy Jordan    URI 02/07/2007   Qualifier: Diagnosis of  By: Antonio ROSALEA Rockers      Medications:  Infusions:   heparin 950 Units/hr (08/15/24 0521)    Assessment: 77 YOF STEMI alert. Patient received 4000 units heparin prior to arrival and additional 4000 units in cath lab. Patient was not on anticoagulation prior to arrival. Patient received cangrelor load and ~2 hour infusion as well as ticagrelor load. Plan to initiate heparin 8 hours after sheath removal and continue for 48 hours.  Heparin level therapeutic at 0.54. No issues with infusion or bleeding noted. CBC stable. Continue current rate. Follow up on potential discontinuation at 48 hours.  Goal  of Therapy:  Heparin level 0.3-0.7 units/ml Monitor platelets by anticoagulation protocol: Yes   Plan:  Continue heparin infusion at 950 units/hr Check anti-Xa level with AM labs and daily while on heparin Continue to monitor H&H and platelets  Laura Hopkins, PharmD PGY1 Clinical Pharmacist [facility]  08/15/2024 6:57 AM

## 2024-08-15 NOTE — Plan of Care (Signed)
 Over the night pt slept without difficulty. Headache treated anticipatory. No c/o of N/V or severe migraine as prior night.      Problem: Education: Goal: Knowledge of General Education information will improve Description: Including pain rating scale, medication(s)/side effects and non-pharmacologic comfort measures Outcome: Progressing   Problem: Health Behavior/Discharge Planning: Goal: Ability to manage health-related needs will improve Outcome: Progressing   Problem: Clinical Measurements: Goal: Ability to maintain clinical measurements within normal limits will improve Outcome: Progressing Goal: Will remain free from infection Outcome: Progressing Goal: Diagnostic test results will improve Outcome: Progressing Goal: Respiratory complications will improve Outcome: Progressing Goal: Cardiovascular complication will be avoided Outcome: Progressing   Problem: Activity: Goal: Risk for activity intolerance will decrease Outcome: Progressing   Problem: Nutrition: Goal: Adequate nutrition will be maintained Outcome: Progressing   Problem: Coping: Goal: Level of anxiety will decrease Outcome: Progressing   Problem: Elimination: Goal: Will not experience complications related to bowel motility Outcome: Progressing Goal: Will not experience complications related to urinary retention Outcome: Progressing   Problem: Pain Managment: Goal: General experience of comfort will improve and/or be controlled Outcome: Progressing   Problem: Safety: Goal: Ability to remain free from injury will improve Outcome: Progressing   Problem: Skin Integrity: Goal: Risk for impaired skin integrity will decrease Outcome: Progressing   Problem: Education: Goal: Understanding of CV disease, CV risk reduction, and recovery process will improve Outcome: Progressing Goal: Individualized Educational Video(s) Outcome: Progressing   Problem: Activity: Goal: Ability to return to baseline  activity level will improve Outcome: Progressing   Problem: Cardiovascular: Goal: Ability to achieve and maintain adequate cardiovascular perfusion will improve Outcome: Progressing Goal: Vascular access site(s) Level 0-1 will be maintained Outcome: Progressing   Problem: Health Behavior/Discharge Planning: Goal: Ability to safely manage health-related needs after discharge will improve Outcome: Progressing

## 2024-08-15 NOTE — Progress Notes (Signed)
  Heart failure team will sign off as of 08/15/24  Pt being transferred out of CCU today.   General Cardiology (Team C, interventional team) will pick up on am rounds starting 11/14.   Follow up as an outpatient in the HF clinic ?  No   If no please follow up with [facility]  after discharge.   Caffie Shed, PA-C

## 2024-08-15 NOTE — Discharge Instructions (Signed)

## 2024-08-16 ENCOUNTER — Other Ambulatory Visit (HOSPITAL_COMMUNITY): Payer: Self-pay

## 2024-08-16 ENCOUNTER — Telehealth: Payer: Self-pay | Admitting: Physician Assistant

## 2024-08-16 DIAGNOSIS — G43809 Other migraine, not intractable, without status migrainosus: Secondary | ICD-10-CM

## 2024-08-16 DIAGNOSIS — I2119 ST elevation (STEMI) myocardial infarction involving other coronary artery of inferior wall: Secondary | ICD-10-CM | POA: Diagnosis not present

## 2024-08-16 DIAGNOSIS — D72829 Elevated white blood cell count, unspecified: Secondary | ICD-10-CM | POA: Insufficient documentation

## 2024-08-16 DIAGNOSIS — N182 Chronic kidney disease, stage 2 (mild): Secondary | ICD-10-CM | POA: Insufficient documentation

## 2024-08-16 DIAGNOSIS — I471 Supraventricular tachycardia, unspecified: Secondary | ICD-10-CM | POA: Insufficient documentation

## 2024-08-16 LAB — CBC
HCT: 40.2 % (ref 36.0–46.0)
Hemoglobin: 13.1 g/dL (ref 12.0–15.0)
MCH: 30.8 pg (ref 26.0–34.0)
MCHC: 32.6 g/dL (ref 30.0–36.0)
MCV: 94.6 fL (ref 80.0–100.0)
Platelets: 232 K/uL (ref 150–400)
RBC: 4.25 MIL/uL (ref 3.87–5.11)
RDW: 13.7 % (ref 11.5–15.5)
WBC: 9.4 K/uL (ref 4.0–10.5)
nRBC: 0 % (ref 0.0–0.2)

## 2024-08-16 LAB — BASIC METABOLIC PANEL WITH GFR
Anion gap: 4 — ABNORMAL LOW (ref 5–15)
BUN: 14 mg/dL (ref 8–23)
CO2: 26 mmol/L (ref 22–32)
Calcium: 8.7 mg/dL — ABNORMAL LOW (ref 8.9–10.3)
Chloride: 104 mmol/L (ref 98–111)
Creatinine, Ser: 1.05 mg/dL — ABNORMAL HIGH (ref 0.44–1.00)
GFR, Estimated: 55 mL/min — ABNORMAL LOW (ref >60)
Glucose, Bld: 96 mg/dL (ref 70–99)
Potassium: 3.7 mmol/L (ref 3.5–5.1)
Sodium: 134 mmol/L — ABNORMAL LOW (ref 135–145)

## 2024-08-16 MED ORDER — ATORVASTATIN CALCIUM 80 MG PO TABS
80.0000 mg | ORAL_TABLET | Freq: Every day | ORAL | 5 refills | Status: AC
  Filled 2024-08-16: qty 30, 30d supply, fill #0

## 2024-08-16 MED ORDER — LOSARTAN POTASSIUM 50 MG PO TABS
50.0000 mg | ORAL_TABLET | Freq: Every day | ORAL | 5 refills | Status: AC
  Filled 2024-08-16: qty 30, 30d supply, fill #0

## 2024-08-16 MED ORDER — NITROGLYCERIN 0.4 MG SL SUBL
0.4000 mg | SUBLINGUAL_TABLET | SUBLINGUAL | 3 refills | Status: AC | PRN
  Filled 2024-08-16: qty 25, 2d supply, fill #0

## 2024-08-16 MED ORDER — BISOPROLOL FUMARATE 5 MG PO TABS
2.5000 mg | ORAL_TABLET | Freq: Every day | ORAL | 5 refills | Status: AC
  Filled 2024-08-16: qty 15, 30d supply, fill #0

## 2024-08-16 MED ORDER — ASPIRIN 81 MG PO TBEC
81.0000 mg | DELAYED_RELEASE_TABLET | Freq: Every day | ORAL | 11 refills | Status: AC
  Filled 2024-08-16: qty 30, 30d supply, fill #0

## 2024-08-16 MED ORDER — POTASSIUM CHLORIDE CRYS ER 20 MEQ PO TBCR
40.0000 meq | EXTENDED_RELEASE_TABLET | Freq: Once | ORAL | Status: AC
  Administered 2024-08-16: 40 meq via ORAL
  Filled 2024-08-16: qty 2

## 2024-08-16 MED ORDER — TICAGRELOR 90 MG PO TABS
90.0000 mg | ORAL_TABLET | Freq: Two times a day (BID) | ORAL | 11 refills | Status: AC
  Filled 2024-08-16: qty 60, 30d supply, fill #0

## 2024-08-16 MED ORDER — ASPIRIN 81 MG PO TBEC
81.0000 mg | DELAYED_RELEASE_TABLET | Freq: Every day | ORAL | Status: DC
  Administered 2024-08-16: 81 mg via ORAL
  Filled 2024-08-16: qty 1

## 2024-08-16 NOTE — Telephone Encounter (Signed)
 FIRST ATTEMPT: Attempted to contact patient with no answer. Left message for call back.

## 2024-08-16 NOTE — Care Management Important Message (Signed)
 Important Message  Patient Details  Name: Laura Hopkins MRN: [id number] Date of Birth: 30-Jul-1946   Important Message Given:  Yes - Medicare IM     Vonzell Arrie Sharps 08/16/2024, 11:13 AM

## 2024-08-16 NOTE — Discharge Summary (Addendum)
 Discharge Summary   Patient ID: Laura Hopkins MRN: [id number]; DOB: 09-Mar-1946  Admit date: 08/13/2024 Discharge date: 08/16/2024  PCP:  Antonio Cyndee Jamee JONELLE, DO   [facility] Providers Cardiologist:  Alm Clay, MD       Discharge Diagnoses  Principal Problem:   Acute ST elevation myocardial infarction (STEMI) of inferolateral wall [facility])   Diagnostic Studies/Procedures    2d echo 08/14/24  1. Mild inferolateral hypokinesis. Left ventricular ejection fraction, by  estimation, is 55 to 60%. The left ventricle has normal function. The left  ventricle demonstrates regional wall motion abnormalities (see scoring  diagram/findings for  description). Left ventricular diastolic parameters were normal.   2. Right ventricular systolic function is normal. The right ventricular  size is normal.   3. The mitral valve is normal in structure. No evidence of mitral valve  regurgitation. No evidence of mitral stenosis.   4. The aortic valve is tricuspid. There is mild calcification of the  aortic valve. Aortic valve regurgitation is not visualized. Aortic valve  sclerosis is present, with no evidence of aortic valve stenosis.   5. The inferior vena cava is normal in size with greater than 50%  respiratory variability, suggesting right atrial pressure of 3 mmHg.    Cath 08/13/24   Dist Cx lesion is 100% stenosed with 100% stenosed side branch in 4th Mrg.  TIMI 0 flow last Peroneal   Balloon angioplasty was performed using a BALLOON EMERGE MR 2.0X12.   Post intervention, there is a 30% residual stenosis. Post intervention, the side branch was reduced to 50% residual stenosis.  TIMI I-II flow up to the next occlusion site restored.   4th Mrg (extension of distal LCx) lesion is 100% stenosed.-Likely distal normalization from the original infarction rating   At this point, decision making with Dr. Verlin was to accept borderline successful PTCA in order to avoid potential  dangerous perforation.  Very distal to the wire this vessel distally.  Allow time for IV medications to work.   ---------------------------------------   Otherwise normal LAD with small diagonals, LCx with 3 other OM's and RCA with PDA and 3 PL's.   LV end diastolic pressure is severely elevated.  There is no aortic valve stenosis.    _____________   History of Present Illness   Laura Hopkins is a 78 y.o. female with remote smoking hx, HTN, asthma, insomnia, migraines, prior skin cancer, recent outpatient evaluations for abdominal pain who was admitted with acute onset of CP and STEMI. She had no prior cardiac history. She reports she had previously been on BP medication but stopped a few months ago as her BP had normalized out. She has been dealing with atypical abdominal pain for several months without clear etiology, no bleeding. Abd US  and CT a/p were unremarkable except for diverticulosis. She had occasionally noticed this under her left breast. On day of admission while at rest at 3:30pm she developed acute onset severe substernal chest pain radiating to her jaw without associated n/v, diaphoresis, palpitations or syncope. She took a full aspirin 325mg  at home and came to [facility] ER where EKG showed acute ST elevation inferiorly as well as V5-V6. Code STEMI was called. She received 4mg  morphine, 4000u heparin, SL NTG, Zofran . She was transported to [facility] directly to the cath lab.   Hospital Course    1. Acute inferolateral STEMI/CAD - Acute inferolateral STEMI from occlusion of distal LCx. Had PTCA to site of occlusion, but further distally  the vessel remained occluded. Had residual chest pain post cath with improved ST changes by EKG, treated with cangrelor and heparin post-cath course . LVEDP was severely elevated by cath but not felt to require diuresis. Echo showed EF 55-60% with mid inferolateral and mid anterolateral hypokinesis, normal RV size and systolic function -  progressed well and transferred out of ICU, feeling great today - current MI rx: ASA, Brilinta 90mg  BID, atorvastatin 80mg  daily, bisoprolol 2.5mg  daily (did not tolerate Toprol  in the past but doing well here on bisoprolol) - patient takes HRT with estradiol  and progesterone - Dr. Verlin advised patient discuss with prescribing provider - f/u baseline EKG today   2. Essential HTN  - controlled, on losartan 50mg  + bisoprolol 2.5mg  daily now - hx of cough with Diovan  in the past, willing to re-trial and see how this goes   3. PSVT - run of SVT this admission, no recurrence with beta blocker added - Mg OK, pharm ordered KCL for K 3.7   4. Migraines with typical migraine this admission - treated with analgesia here - CT of the head nonacute, moderate chronic microvascular changes - would avoid triptans going forward - OK for [place] (has sample at home) - keep PCP f/u 08/20/24, placed referral for neurology per pt request   5. Leukocytosis - resolved, likely stress demargination from AMI   6. 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 Verlin has seen and examined the patient today and feels she is stable for discharge.      Did the patient have an acute coronary syndrome (MI, NSTEMI, STEMI, etc) this admission?:  Yes                               AHA/ACC ACS Clinical Performance & Quality Measures: Aspirin prescribed? - Yes ADP Receptor Inhibitor (Plavix/Clopidogrel, Brilinta/Ticagrelor or Effient/Prasugrel) prescribed (includes medically managed patients)? - Yes Beta Blocker prescribed? - Yes High Intensity Statin (Lipitor 40-80mg  or Crestor 20-40mg ) prescribed? - Yes EF assessed during THIS hospitalization? - Yes For EF <40%, was ACEI/ARB  prescribed? - N/A EF >40% For EF <40%, Aldosterone Antagonist (Spironolactone  or Eplerenone) prescribed? - Not Applicable (EF >/= 40%) Cardiac Rehab Phase II ordered (including medically managed patients)? - Yes       The patient will be scheduled for a TOC follow up appointment. A message has been sent to the [facility] and Scheduling Pool at the office where the patient should be seen for follow up.  _____________  Discharge Vitals Blood pressure 122/80, pulse 73, temperature 97.9 F (36.6 C), temperature source Oral, resp. rate 16, weight 67.9 kg, SpO2 97%.  Filed Weights   08/14/24 0500 08/15/24 0600 08/15/24 2112  Weight: 70.2 kg 69.4 kg 67.9 kg   EKG: NSR 71bpm nonspecific STTW changes  Labs & Radiologic Studies  CBC Recent Labs    08/13/24 1633 08/14/24 0216 08/15/24 0220 08/16/24 0321  WBC 9.8   < > 12.6* 9.4  NEUTROABS 5.7  --   --   --   HGB 14.9   < > 14.1 13.1  HCT 44.9   < > 42.4 40.2  MCV 94.3   < >  94.9 94.6  PLT 280   < > 258 232   < > = values in this interval not displayed.   Basic Metabolic Panel Recent Labs    88/87/74 1429 08/15/24 0220 08/16/24 0321  NA  --  131* 134*  K  --  3.9 3.7  CL  --  100 104  CO2  --  22 26  GLUCOSE  --  103* 96  BUN  --  12 14  CREATININE  --  0.88 1.05*  CALCIUM  --  8.8* 8.7*  MG 2.3  --   --    Liver Function Tests Recent Labs    08/13/24 1633  AST 22  ALT <5  ALKPHOS 92  BILITOT 0.4  PROT 7.3  ALBUMIN 4.6   No results for input(s): LIPASE, AMYLASE in the last 72 hours. High Sensitivity Troponin:   Recent Labs  Lab 08/13/24 1941 08/13/24 2254 08/14/24 0216 08/14/24 1429  TROPONINIHS 1,326* 19,857* >24,000* >24,000*    Recent Labs  Lab 08/13/24 1633  TRNPT <15    BNP Invalid input(s): POCBNP No results for input(s): PROBNP in the last 72 hours.  No results for input(s): BNP in the last 72 hours.  D-Dimer No results for input(s): DDIMER in the last 72 hours. Hemoglobin  A1C Recent Labs    08/13/24 1633  HGBA1C 5.2   Fasting Lipid Panel Recent Labs    08/13/24 1633  CHOL 182  HDL 67  LDLCALC 83  TRIG 158*  CHOLHDL 2.7   Lipoprotein (a)  Date/Time Value Ref Range Status  08/14/2024 02:16 AM 21.4 <75.0 nmol/L Final    Comment:    (NOTE) This test was developed and its performance characteristics determined by [facility]. It has not been cleared or approved by the Food and Drug Administration. Note:  Values greater than or equal to 75.0 nmol/L may       indicate an independent risk factor for CHD,       but must be evaluated with caution when applied       to non-Caucasian populations due to the       influence of genetic factors on Lp(a) across       ethnicities. Performed At: [facility] [address] [place], [place] [id number] Jennette Shorter MD Ey:[id number]     Thyroid  Function Tests No results for input(s): TSH, T4TOTAL, T3FREE, THYROIDAB in the last 72 hours.  Invalid input(s): FREET3 _____________  ECHOCARDIOGRAM COMPLETE Result Date: 08/14/2024    ECHOCARDIOGRAM REPORT   Patient Name:   Laura Hopkins Date of Exam: 08/14/2024 Medical Rec #:  [id number]      Height:       64.0 in Accession #:    [id number]     Weight:       154.8 lb Date of Birth:  [birth year]     BSA:          1.754 m Patient Age:    77 years       BP:           143/88 mmHg Patient Gender: F              HR:           77 bpm. Exam Location:  Inpatient Procedure: 2D Echo, Cardiac Doppler and Color Doppler (Both Spectral and Color            Flow Doppler were utilized during procedure). Indications:    Myocardial Infact  History:  Patient has no prior history of Echocardiogram examinations.                 Signs/Symptoms:Shortness of Breath; Risk Factors:Hypertension                 and Sleep Apnea.  Sonographer:    Philomena Daring Referring Phys: 18 DAVID W HARDING IMPRESSIONS  1. Mild inferolateral hypokinesis. Left ventricular ejection fraction,  by estimation, is 55 to 60%. The left ventricle has normal function. The left ventricle demonstrates regional wall motion abnormalities (see scoring diagram/findings for description). Left ventricular diastolic parameters were normal.  2. Right ventricular systolic function is normal. The right ventricular size is normal.  3. The mitral valve is normal in structure. No evidence of mitral valve regurgitation. No evidence of mitral stenosis.  4. The aortic valve is tricuspid. There is mild calcification of the aortic valve. Aortic valve regurgitation is not visualized. Aortic valve sclerosis is present, with no evidence of aortic valve stenosis.  5. The inferior vena cava is normal in size with greater than 50% respiratory variability, suggesting right atrial pressure of 3 mmHg. FINDINGS  Left Ventricle: Mild inferolateral hypokinesis. Left ventricular ejection fraction, by estimation, is 55 to 60%. The left ventricle has normal function. The left ventricle demonstrates regional wall motion abnormalities. The left ventricular internal cavity size was normal in size. There is no left ventricular hypertrophy. Left ventricular diastolic parameters were normal. Right Ventricle: The right ventricular size is normal. No increase in right ventricular wall thickness. Right ventricular systolic function is normal. Left Atrium: Left atrial size was normal in size. Right Atrium: Right atrial size was normal in size. Pericardium: There is no evidence of pericardial effusion. Mitral Valve: The mitral valve is normal in structure. No evidence of mitral valve regurgitation. No evidence of mitral valve stenosis. Tricuspid Valve: The tricuspid valve is normal in structure. Tricuspid valve regurgitation is trivial. No evidence of tricuspid stenosis. Aortic Valve: The aortic valve is tricuspid. There is mild calcification of the aortic valve. Aortic valve regurgitation is not visualized. Aortic valve sclerosis is present, with no  evidence of aortic valve stenosis. Pulmonic Valve: The pulmonic valve was normal in structure. Pulmonic valve regurgitation is not visualized. No evidence of pulmonic stenosis. Aorta: The aortic root is normal in size and structure. Venous: The inferior vena cava is normal in size with greater than 50% respiratory variability, suggesting right atrial pressure of 3 mmHg. IAS/Shunts: No atrial level shunt detected by color flow Doppler.  LEFT VENTRICLE PLAX 2D LVIDd:         4.00 cm   Diastology LVIDs:         2.50 cm   LV e' medial:    8.27 cm/s LV PW:         0.90 cm   LV E/e' medial:  8.3 LV IVS:        0.90 cm   LV e' lateral:   11.90 cm/s LVOT diam:     1.90 cm   LV E/e' lateral: 5.8 LV SV:         58 LV SV Index:   33 LVOT Area:     2.84 cm LV IVRT:       81 msec  RIGHT VENTRICLE             IVC RV S prime:     14.30 cm/s  IVC diam: 1.20 cm TAPSE (M-mode): 2.3 cm LEFT ATRIUM  Index        RIGHT ATRIUM          Index LA diam:        2.80 cm 1.60 cm/m   RA Area:     9.61 cm LA Vol (A2C):   25.3 ml 14.42 ml/m  RA Volume:   17.20 ml 9.80 ml/m LA Vol (A4C):   25.0 ml 14.25 ml/m LA Biplane Vol: 26.7 ml 15.22 ml/m  AORTIC VALVE LVOT Vmax:   98.00 cm/s LVOT Vmean:  63.900 cm/s LVOT VTI:    0.204 m  AORTA Ao Root diam: 2.70 cm Ao Asc diam:  3.10 cm MITRAL VALVE               TRICUSPID VALVE MV Area (PHT): 3.99 cm    TR Peak grad:   6.2 mmHg MV Decel Time: 190 msec    TR Vmax:        124.00 cm/s MV E velocity: 68.70 cm/s MV A velocity: 80.70 cm/s  SHUNTS MV E/A ratio:  0.85        Systemic VTI:  0.20 m                            Systemic Diam: 1.90 cm Morene Brownie Electronically signed by Morene Brownie Signature Date/Time: 08/14/2024/4:05:37 PM    Final    CT HEAD WO CONTRAST ( ) Result Date: 08/14/2024 CLINICAL DATA:  Initial evaluation for acute headache, classic migraine. EXAM: CT HEAD WITHOUT CONTRAST TECHNIQUE: Contiguous axial images were obtained from the base of the skull through the  vertex without intravenous contrast. RADIATION DOSE REDUCTION: This exam was performed according to the departmental dose-optimization program which includes automated exposure control, adjustment of the mA and/or kV according to patient size and/or use of iterative reconstruction technique. COMPARISON:  Comparison made with MRI from 01/14/2023. FINDINGS: Brain: Cerebral volume within normal limits. Patchy hypodensity involving the supratentorial cerebral white matter, most characteristic of chronic microvascular ischemic disease, moderate in nature. No acute intracranial hemorrhage. No acute large vessel territory infarct. No mass lesion, mass effect or midline shift. No hydrocephalus or extra-axial fluid collection. Vascular: No abnormal hyperdense vessel. Scattered vascular calcifications noted within the carotid siphons. Skull: Scalp soft tissues within normal limits.  Calvarium intact. Sinuses/Orbits: Globes and orbital soft tissues are within normal limits. Mild mucoperiosteal thickening present about the left sphenoid sinus. Paranasal sinuses are otherwise clear. No mastoid effusion. Other: None. IMPRESSION: 1. No acute intracranial abnormality. 2. Moderate chronic microvascular ischemic disease. Electronically Signed   By: Morene Hoard M.D.   On: 08/14/2024 03:41   CARDIAC CATHETERIZATION Result Date: 08/13/2024 Images from the original result were not included.   Dist Cx lesion is 100% stenosed with 100% stenosed side branch in 4th Mrg.  TIMI 0 flow last Peroneal   Balloon angioplasty was performed using a BALLOON EMERGE MR 2.0X12.   Post intervention, there is a 30% residual stenosis. Post intervention, the side branch was reduced to 50% residual stenosis.  TIMI I-II flow up to the next occlusion site restored.   4th Mrg (extension of distal LCx) lesion is 100% stenosed.-Likely distal normalization from the original infarction rating   At this point, decision making with Dr. Verlin was to  accept borderline successful PTCA in order to avoid potential dangerous perforation.  Very distal to the wire this vessel distally.  Allow time for IV medications to work.   ---------------------------------------   Otherwise normal  LAD with small diagonals, LCx with 3 other OM's and RCA with PDA and 3 PL's.   LV end diastolic pressure is severely elevated.  There is no aortic valve stenosis. Diagnostic: Dominance: Right     Intervention. Acute Inferolateral STEMI involving the distal LCx and a very tortuous vessel: 100% occlusion after major bend in the takeoff of a small sidebranch. => Minimally successful PTCA restoring flow to another 40 mm of the vessel but still no reflow distally despite IC NTG and angioplasty with small pressures. Due to the tortuosity, I opted to forego further treatment and left balloon and plasty result only. LV gram not performed-concern due to LVEDP estimated 25 mmHg.  Plan: Admit to ICU -continue cangrelor until current bag complete.  Would load with Brilinta 180 mg prior to completion of cangrelor. Initiate heparin 8 hours after sheath removal = plan would be to continue 48 hours of IV heparin IV analgesics ordered for pain control given the ongoing chest pain. Will check 2D Echo. Multiple different medication intolerances, will start low-dose irbesartan  and consider bisoprolol. Alm Clay, MD   DG Chest Port 1 View Result Date: 08/13/2024 CLINICAL DATA:  Chest pain. EXAM: PORTABLE CHEST 1 VIEW COMPARISON:  Chest radiograph dated 11/07/2019. FINDINGS: The heart size and mediastinal contours are within normal limits. Both lungs are clear. The visualized skeletal structures are unremarkable. IMPRESSION: No active disease. Electronically Signed   By: Vanetta Chou M.D.   On: 08/13/2024 16:54    Disposition Pt is being discharged home today in good condition.  Follow-up Plans & Appointments  Follow-up Information     Antonio Cyndee Jamee JONELLE, DO Follow up.   Specialty:  Family Medicine Contact information: [address] [place] [address] [phone]         Rana Lum CROME, NP Follow up.   Specialty: Cardiology Why: Davene Nicolas - cardiology follow-up at [facility] location with NP Lum Rana on Tuesday Aug 27, 2024 2:45 PM (Arrive by 2:25 PM). Contact information: [address] [place] [address] [phone]                Discharge Instructions     AMB Referral to Cardiac Rehabilitation - Phase II   Complete by: As directed    Diagnosis:  STEMI PTCA     After initial evaluation and assessments completed: Virtual Based Care may be provided alone or in conjunction with Phase 2 Cardiac Rehab based on patient barriers.: Yes   Intensive Cardiac Rehabilitation (ICR) [facility] location only OR Traditional Cardiac Rehabilitation (TCR) *If criteria for ICR are not met will enroll in TCR [facility] only): Yes   Diet - low sodium heart healthy   Complete by: As directed    Discharge instructions   Complete by: As directed    You were started on two new blood thinners, aspirin and ticagrelor. It is extremely important not to miss a single dose of these medications. If you notice any bleeding such as blood in stool, black tarry stools, blood in urine, nosebleeds or any other unusual bleeding, call your doctor immediately. It is not normal to have this kind of bleeding while on a blood thinner and usually indicates there is an underlying problem with one of your body systems that needs to be checked out.   Additional new medicines include atorvastatin, bisoprolol, and losartan. If you develop a cough on losartan, let our team know. You were also given a prescription for as-needed nitroglycerin.  Dr.  Stephania Macfarlane would like you to discuss your hormone replacement therapy with the doctor that prescribes this, given the updated recent heart events, as they may recommend a change to your regimen.  Given your heart attack,  we would recommend to avoid sumatriptan  (and medicines in the triptan class) going forward. We placed a neurology referral to [facility] for your migraines.   Increase activity slowly   Complete by: As directed    No driving for 2 weeks. No lifting over 10 lbs for 4 weeks. No sexual activity for 4 weeks. You may not return to work until cleared by your cardiologist, if applicable. Keep procedure site clean & dry. If you notice increased pain, swelling, bleeding or pus, call/return!  You may shower, but no soaking baths/hot tubs/pools for 1 week.       Discharge Medications Allergies as of 08/16/2024       Reactions   Avapro  [irbesartan ] Cough   Codeine Other (See Comments)   Makes pt hyper    Metoprolol  Cough   Metallic taste , nasal drainage    Norvasc  [amlodipine  Besylate] Swelling        Medication List     PAUSE taking these medications    estradiol  0.05 MG/24HR patch Wait to take this until your doctor or other care provider tells you to start again. Commonly known as: VIVELLE -DOT Place 1 patch (0.05 mg total) onto the skin 2 (two) times a week.   progesterone 100 MG capsule Wait to take this until your doctor or other care provider tells you to start again. Commonly known as: PROMETRIUM Take 100 mg by mouth at bedtime.       STOP taking these medications    SUMAtriptan  100 MG tablet Commonly known as: IMITREX        TAKE these medications    acetaminophen  500 MG tablet Commonly known as: TYLENOL  Take 500 mg by mouth every 6 (six) hours as needed for mild pain (pain score 1-3).   Aimovig  70 MG/ML Soaj Generic drug: Erenumab -aooe Inject 70 mg into the skin every 30 (thirty) days.   aspirin EC 81 MG tablet Take 1 tablet (81 mg total) by mouth daily. Swallow whole. Start taking on: August 17, 2024   atorvastatin 80 MG tablet Commonly known as: LIPITOR Take 1 tablet (80 mg total) by mouth daily. Start taking on: August 17, 2024    bisoprolol 5 MG tablet Commonly known as: ZEBETA Take 0.5 tablets (2.5 mg total) by mouth daily. Start taking on: August 17, 2024   calcium carbonate 500 MG chewable tablet Commonly known as: TUMS - dosed in mg elemental calcium Chew 1 tablet by mouth daily as needed for indigestion or heartburn.   Eszopiclone  3 MG Tabs Take 1 tablet (3 mg total) by mouth daily. What changed:  when to take this reasons to take this   losartan 50 MG tablet Commonly known as: COZAAR Take 1 tablet (50 mg total) by mouth daily. Start taking on: August 17, 2024   nitroGLYCERIN 0.4 MG SL tablet Commonly known as: Nitrostat Place 1 tablet (0.4 mg total) under the tongue every 5 (five) minutes as needed for chest pain. Up to 3 doses   ondansetron  4 MG tablet Commonly known as: Zofran  Take 1 tablet (4 mg total) by mouth every 8 (eight) hours as needed for nausea or vomiting.   promethazine  25 MG tablet Commonly known as: PHENERGAN  Take 1 tablet (25 mg total) by mouth as needed.   Symbicort  80-4.5 MCG/ACT  inhaler Generic drug: budesonide -formoterol  INHALE 1 PUFF INTO THE LUNGS IN THE MORNING AND AT BEDTIME.   ticagrelor 90 MG Tabs tablet Commonly known as: BRILINTA Take 1 tablet (90 mg total) by mouth 2 (two) times daily.         Outstanding Labs/Studies N/a  Duration of Discharge Encounter: APP Time: 15 minutes   Signed, Raphael LOISE Bring, PA-C 08/16/2024, 10:35 AM   Erminio GORMAN Cleaves was seen by me today along with Dayna Dunn, PA-C. I have personally performed an evaluation on this patient.  My findings are as follows:  78 y.o. female with history of tobacco abuse, HTN admitted with chest pain. Cardiac cath with occluded distal OM. This was treated with balloon angioplasty. No stent placed. She is doing well. No chest pain.   Data: EKG(s) and pertinent labs, studies, etc were personally reviewed and interpreted by me:  Telemetry reviewed by me: sinus All labs reviewed by  me Otherwise, I agree with data as outlined by the advanced practice provider.  Exam performed by me: Gen: NAD Neck: No JVD Cardiac:  RRR without murmur Lungs: clear bilaterally Extremities: No LE edema  My Assessment and Plan:  CAD/Inferolateral STEMI: No chest pain. Discharge home on ASA and Brilinta. Continue statin and beta blocker.   HTN: BP controlled. Continue losartan and bisoprolol  The patient will be discharged home today  I spent 20 minutes seeing the patient. During that time, I reviewed the labs, EKG, telemetry,  cath films,  echo images, evaluated their symptoms and performed an examination. This time also included plan formulation, discussion of the plan with the patient and the time spent with documentation.   Signed,  Lonni Cash, MD  08/16/2024 10:54 AM

## 2024-08-16 NOTE — Progress Notes (Signed)
 Progress Note  Patient Name: Laura Hopkins Date of Encounter: 08/16/2024  Primary Cardiologist: Alm Clay, MD  Subjective   No CP or SOB. No headache. Eager for DC today if possible  Inpatient Medications    Scheduled Meds:  aspirin EC  81 mg Oral Daily   atorvastatin  80 mg Oral Daily   bisoprolol  2.5 mg Oral Daily   Chlorhexidine Gluconate Cloth  6 each Topical Daily   fluticasone furoate-vilanterol  1 puff Inhalation Daily   losartan  50 mg Oral Daily   polyethylene glycol  17 g Oral Daily   potassium chloride  40 mEq Oral Once   sodium chloride  flush  3 mL Intravenous Q12H   sucralfate   1 g Oral TID WC & HS   ticagrelor  90 mg Oral BID   Continuous Infusions:  PRN Meds: HYDROmorphone (DILAUDID) injection, ketorolac , ondansetron  (ZOFRAN ) IV, oxyCODONE, promethazine , sodium chloride  flush, zolpidem   Vital Signs    Vitals:   08/16/24 0042 08/16/24 0441 08/16/24 0724 08/16/24 0739  BP: 134/80 128/75  122/80  Pulse:    73  Resp: 16 18  16   Temp: 97.8 F (36.6 C) 97.8 F (36.6 C)  97.9 F (36.6 C)  TempSrc: Oral Oral  Oral  SpO2: 98%  98% 97%  Weight:        Intake/Output Summary (Last 24 hours) at 08/16/2024 0830 Last data filed at 08/16/2024 0500 Gross per 24 hour  Intake 199.18 ml  Output --  Net 199.18 ml      08/15/2024    9:12 PM 08/15/2024    6:00 AM 08/14/2024    5:00 AM  Last 3 Weights  Weight (lbs) 149 lb 11.2 oz 153 lb 1.6 oz 154 lb 12.2 oz  Weight (kg) 67.903 kg 69.446 kg 70.2 kg     Telemetry    NSR - Personally Reviewed  ECG    Pending - Personally Reviewed  Physical Exam   GEN: No acute distress.  HEENT: Normocephalic, atraumatic, sclera non-icteric. Neck: No JVD or bruits. Cardiac: RRR no murmurs, rubs, or gallops.  Respiratory: Clear to auscultation bilaterally. Breathing is unlabored. GI: Soft, nontender, non-distended, BS +x 4. MS: no deformity. Extremities: No clubbing or cyanosis. No edema. Distal pedal  pulses are 2+ and equal bilaterally. Right radial cath site without hematoma, good pulse. Minimal inner forearm ecchymosis. Neuro:  AAOx3. Follows commands. Psych:  Responds to questions appropriately with a normal affect.  Labs    High Sensitivity Troponin:   Recent Labs  Lab 08/13/24 1941 08/13/24 2254 08/14/24 0216 08/14/24 1429  TROPONINIHS 1,326* 19,857* >24,000* >24,000*      Cardiac EnzymesNo results for input(s): TROPONINI in the last 168 hours. No results for input(s): TROPIPOC in the last 168 hours.   Chemistry Recent Labs  Lab 08/13/24 1633 08/14/24 0216 08/15/24 0220 08/16/24 0321  NA 138 138 131* 134*  K 4.1 4.0 3.9 3.7  CL 100 102 100 104  CO2 24 23 22 26   GLUCOSE 139* 131* 103* 96  BUN 20 14 12 14   CREATININE 0.83 0.86 0.88 1.05*  CALCIUM 9.7 8.8* 8.8* 8.7*  PROT 7.3  --   --   --   ALBUMIN 4.6  --   --   --   AST 22  --   --   --   ALT <5  --   --   --   ALKPHOS 92  --   --   --  BILITOT 0.4  --   --   --   GFRNONAA >60 >60 >60 55*  ANIONGAP 15 13 9  4*     Hematology Recent Labs  Lab 08/14/24 0216 08/15/24 0220 08/16/24 0321  WBC 13.4* 12.6* 9.4  RBC 4.61 4.47 4.25  HGB 14.5 14.1 13.1  HCT 43.7 42.4 40.2  MCV 94.8 94.9 94.6  MCH 31.5 31.5 30.8  MCHC 33.2 33.3 32.6  RDW 13.2 13.6 13.7  PLT 257 258 232    BNPNo results for input(s): BNP, PROBNP in the last 168 hours.   DDimer No results for input(s): DDIMER in the last 168 hours.   Radiology    No results found.  Cardiac Studies   2d echo 08/14/24  1. Mild inferolateral hypokinesis. Left ventricular ejection fraction, by  estimation, is 55 to 60%. The left ventricle has normal function. The left  ventricle demonstrates regional wall motion abnormalities (see scoring  diagram/findings for  description). Left ventricular diastolic parameters were normal.   2. Right ventricular systolic function is normal. The right ventricular  size is normal.   3. The mitral valve  is normal in structure. No evidence of mitral valve  regurgitation. No evidence of mitral stenosis.   4. The aortic valve is tricuspid. There is mild calcification of the  aortic valve. Aortic valve regurgitation is not visualized. Aortic valve  sclerosis is present, with no evidence of aortic valve stenosis.   5. The inferior vena cava is normal in size with greater than 50%  respiratory variability, suggesting right atrial pressure of 3 mmHg.   Cath 08/13/24   Dist Cx lesion is 100% stenosed with 100% stenosed side branch in 4th Mrg.  TIMI 0 flow last Peroneal   Balloon angioplasty was performed using a BALLOON EMERGE MR 2.0X12.   Post intervention, there is a 30% residual stenosis. Post intervention, the side branch was reduced to 50% residual stenosis.  TIMI I-II flow up to the next occlusion site restored.   4th Mrg (extension of distal LCx) lesion is 100% stenosed.-Likely distal normalization from the original infarction rating   At this point, decision making with Dr. Verlin was to accept borderline successful PTCA in order to avoid potential dangerous perforation.  Very distal to the wire this vessel distally.  Allow time for IV medications to work.   ---------------------------------------   Otherwise normal LAD with small diagonals, LCx with 3 other OM's and RCA with PDA and 3 PL's.   LV end diastolic pressure is severely elevated.  There is no aortic valve stenosis.  Patient Profile     78 y.o. female with remote smoking hx, HTN, asthma, insomnia, migraines, prior skin cancer, recent outpatient evaluations for abdominal pain who was admitted with acute onset of CP and STEMI.  Assessment & Plan    1. Acute inferolateral STEMI/CAD - Acute inferolateral STEMI from occlusion of distal LCx. Had PTCA to site of occlusion, but further distally the vessel remained occluded. Had residual chest pain post cath with improved ST changes by EKG, treated with cangrelor and heparin post-cath  course.  SABRA LVEDP was severely elevated by cath but not felt to require diuresis. Echo showed EF 55-60% with mid inferolateral and mid anterolateral hypokinesis, normal RV size and systolic function.  - current rx: ASA, Brilinta 90mg  BID, atorvastatin 80mg  daily, bisoprolol 2.5mg  daily (Did not tolerate Toprol  in the past) - patient takes HRT with estradiol  and progesterone, await input from MD regarding resumption at dc vs OP discussion  with prescribing OBGYN - f/u baseline EKG today   2. Essential HTN  - controlled, on losartan 50mg  + bisoprolol 2.5mg  daily now - hx of cough with Diovan  in the past, willing to re-trial and see how this goes  3. PSVT - run of SVT this admission, no recurrence with beta blockade - Mg OK, pharm ordered KCL for K 3.7   4. H/o GERD  - continue home regimen   5. Migraines with typical migraine this admission - treated with analgesia here - CT of the head nonacute, moderate chronic microvascular changes - would avoid triptans going forward - OK for [place] (has sample at home) - keep PCP f/u 08/20/24, placed referral for neurology per pt request  6. Leukocytosis - resolved, likely stress demargination from AMI  7. 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  Signed, Raphael LOISE Bring, PA-C 08/16/2024, 8:30 AM

## 2024-08-16 NOTE — Progress Notes (Signed)
 DISCHARGE NOTE HOME Laura Hopkins to be discharged Home per MD order. Discussed prescriptions and follow up appointments with the patient. Prescriptions given to patient; medication list explained in detail. Patient verbalized understanding.  Skin clean, dry and intact without evidence of skin break down, no evidence of skin tears noted. IV catheter discontinued intact. Site without signs and symptoms of complications. Dressing and pressure applied. Pt denies pain at the site currently. No complaints noted.  Patient free of lines, drains, and wounds.   An After Visit Summary (AVS) was printed and given to the patient. Patient escorted via wheelchair, and discharged home via private auto.  Peyton SHAUNNA Pepper, RN

## 2024-08-16 NOTE — Telephone Encounter (Signed)
   Transition of Care Follow-up Phone Call Request    Patient Name: Laura Hopkins Date of Birth: [birth year] Date of Encounter: 08/16/2024  Primary Care Provider:  Antonio Meth, Jamee SAUNDERS, DO Primary Cardiologist:  None  Laura Hopkins has been scheduled for a transition of care follow up appointment with a [facility] provider:  Lum Louis NP Tuesday Aug 27, 2024 2:45 PM (Arrive by 2:25 PM)   Please reach out to Laura Hopkins within 48 hours of discharge to confirm appointment and review transition of care protocol questionnaire.  Anticipated discharge date: suspected later today, official timing TBD  [employer], PA-C  08/16/2024, 8:27 AM

## 2024-08-16 NOTE — Plan of Care (Signed)

## 2024-08-16 NOTE — Progress Notes (Signed)
 CARDIAC REHAB PHASE I   PRE:  Rate/Rhythm: 63 SR    BP: sitting 139/84    SpO2:   MODE:  Ambulation: 400 ft   POST:  Rate/Rhythm: 80 SR    BP: sitting 142/96     SpO2:    Pt tolerated well, no c/o.  Discussed with pt and husband MI, PTCA, Brilinta importance, diet, exercise, NTG and CRPII. Pt receptive. Will refer to [facility] CRPII.  [id number]  Aliene Aris BS, ACSM-CEP 08/16/2024 10:55 AM

## 2024-08-19 ENCOUNTER — Telehealth: Payer: Self-pay

## 2024-08-19 NOTE — Telephone Encounter (Signed)
 Patient contacted by clinical team regarding discharge from hospital on 10/17/2023.  Patient understands to follow up with provider [place] on 08/27/2024 at 2:45 pm at [facility] office. Patient understands discharge instructions? Yes Patient understands medications and how to take them? Yes Patient understands to bring all medications to this visit? Yes  You can review AVS Prior to calling to determine if patient had orders for Home Health or Medical Equipment (DME). Patient reports appropriate help at home with ADL's and has DME at this time. No home health or DME ordered Patient reports they have been contacted by home health if ordered. No   You can see if the patient is active in MyChart next to their picture.  If they are please remind them that they can do early check in for their visit a day or two before to make the day of their visit run as smooth as possible.  If they need help with MyChart you can refer them to their AVS for user name and password and give them the phone number [phone] to call for help.     Postop: Structural/Cath/Device/(other cardiac invasive procedures) patients:   What is your wound status? Any signs/ symptoms of infection (Temp, redness/ red streaks, swelling, purulent drainage, foul odor or smell)? No signs or symptoms   Please do not place any creams/ lotions/ or antibiotic ointment on any surgical incisions/ wounds without physician approval.

## 2024-08-19 NOTE — Transitions of Care (Post Inpatient/ED Visit) (Signed)
   08/19/2024  Name: Laura Hopkins MRN: [id number] DOB: January 03, 1946  Today's TOC FU Call Status: Today's TOC FU Call Status:: Unsuccessful Call (1st Attempt) Unsuccessful Call (1st Attempt) Date: 08/19/24  Attempted to reach the patient regarding the most recent Inpatient/ED visit.  Follow Up Plan: Additional outreach attempts will be made to reach the patient to complete the Transitions of Care (Post Inpatient/ED visit) call.   Medford Balboa, BSN, RN [place]  VBCI - [employer] [facility] RN Care Manager [phone]

## 2024-08-19 NOTE — Transitions of Care (Post Inpatient/ED Visit) (Signed)
 08/19/2024  Name: Laura Hopkins MRN: [id number] DOB: [birth year]  Today's TOC FU Call Status: Today's TOC FU Call Status:: Successful TOC FU Call Completed TOC FU Call Complete Date: 08/19/24  Patient's Name and Date of Birth confirmed. Name, DOB  Transition Care Management Follow-up Telephone Call Date of Discharge: 08/16/24 Discharge Facility: [facility]) Type of Discharge: Inpatient Admission Primary Inpatient Discharge Diagnosis:: STEMI How have you been since you were released from the hospital?: Better Any questions or concerns?: No  Items Reviewed: Did you receive and understand the discharge instructions provided?: Yes Medications obtained,verified, and reconciled?: Yes (Medications Reviewed) Any new allergies since your discharge?: No Dietary orders reviewed?: Yes Type of Diet Ordered:: Low sodium Heart Healthy Do you have support at home?: Yes People in Home [RPT]: spouse Name of Support/Comfort Primary Source: Jenene Cleaves  Medications Reviewed Today: Medications Reviewed Today     Reviewed by Moises Reusing, RN (Case Manager) on 08/19/24 at 1552  Med List Status: <None>   Medication Order Taking? Sig Documenting Provider Last Dose Status Informant  acetaminophen  (TYLENOL ) 500 MG tablet [id number]  Take 500 mg by mouth every 6 (six) hours as needed for mild pain (pain score 1-3). [provider]  Active Self  aspirin EC 81 MG tablet [id number] Yes Take 1 tablet (81 mg total) by mouth daily. Swallow whole. Dunn, Dayna N, PA-C  Active   atorvastatin (LIPITOR) 80 MG tablet [id number] Yes Take 1 tablet (80 mg total) by mouth daily. Dunn, Dayna N, PA-C  Active   bisoprolol (ZEBETA) 5 MG tablet [id number] Yes Take 0.5 tablets (2.5 mg total) by mouth daily. Dunn, Dayna N, PA-C  Active   calcium carbonate (TUMS - DOSED IN MG ELEMENTAL CALCIUM) 500 MG chewable tablet [id number]  Chew 1 tablet by mouth daily as needed for indigestion or heartburn. [provider]  Active Self  Erenumab -aooe (AIMOVIG ) 70 MG/ML SOAJ [id number] Yes Inject 70 mg into the skin every 30 (thirty) days. Lowne Chase, Yvonne R, DO  Active Self           Med Note (SATTERFIELD, TEENA BRAVO   Tue Aug 13, 2024  8:34 PM) Take on the 15th of every month per patient   estradiol  (VIVELLE -DOT) 0.05 MG/24HR patch [id number]  Place 1 patch (0.05 mg total) onto the skin 2 (two) times a week. Antonio Cyndee Jamee JONELLE, DO  Active Self           Med Note (SATTERFIELD, TEENA BRAVO   Tue Aug 13, 2024  8:30 PM) Mondays and fridays  Eszopiclone  3 MG TABS [id number]  Take 1 tablet (3 mg total) by mouth daily.  Patient taking differently: Take 3 mg by mouth daily as needed (for sleep).   Lowne Chase, Yvonne R, DO  Active Self  losartan (COZAAR) 50 MG tablet [id number] Yes Take 1 tablet (50 mg total) by mouth daily. Dunn, Dayna N, PA-C  Active   nitroGLYCERIN (NITROSTAT) 0.4 MG SL tablet [id number]  Place 1 tablet (0.4 mg total) under the tongue every 5 (five) minutes as needed for chest pain. Up to 3 doses Dunn, Dayna N, PA-C  Active   ondansetron  (ZOFRAN ) 4 MG tablet [id number]  Take 1 tablet (4 mg total) by mouth every 8 (eight) hours as needed for nausea or vomiting. Lowne Chase, Yvonne R, DO  Active Self  progesterone (PROMETRIUM) 100 MG capsule [id number]  Take 100 mg by mouth at bedtime. [provider]  Active Self  promethazine  (PHENERGAN )  25 MG tablet [id number]  Take 1 tablet (25 mg total) by mouth as needed. Lowne Chase, Yvonne R, DO  Active Self  SYMBICORT  80-4.5 MCG/ACT inhaler [id number]  INHALE 1 PUFF INTO THE LUNGS IN THE MORNING AND AT BEDTIME. Mannam, Praveen, MD  Active Self  ticagrelor (BRILINTA) 90 MG TABS tablet [id number] Yes Take 1 tablet (90 mg total) by mouth 2 (two) times daily. Abigail Raphael SAILOR, PA-C  Active   Med List Note Worley Shone, [place] 12/22/17 1114): IMITREX  next refill order 9 tablets only insurance want cover 10            Home Care and  Equipment/Supplies: Were Home Health Services Ordered?: NA Any new equipment or medical supplies ordered?: NA  Functional Questionnaire: Do you need assistance with bathing/showering or dressing?: No Do you need assistance with meal preparation?: No Do you need assistance with eating?: No Do you have difficulty maintaining continence: No Do you need assistance with getting out of bed/getting out of a chair/moving?: No Do you have difficulty managing or taking your medications?: No  Follow up appointments reviewed: PCP Follow-up appointment confirmed?: Yes Date of PCP follow-up appointment?: 08/20/24 Follow-up Provider: Dr. Willie [facility] Follow-up appointment confirmed?: Yes Date of Specialist follow-up appointment?: 08/27/24 Follow-Up Specialty Provider:: Dr. Rana Do you need transportation to your follow-up appointment?: No Do you understand care options if your condition(s) worsen?: Yes-patient verbalized understanding  SDOH Interventions Today    Flowsheet Row Most Recent Value  SDOH Interventions   Food Insecurity Interventions Intervention Not Indicated  Housing Interventions Intervention Not Indicated  Transportation Interventions Intervention Not Indicated  Utilities Interventions Intervention Not Indicated    Medford Balboa, BSN, RN [place]  VBCI - [facility] RN Care Manager [phone]

## 2024-08-20 ENCOUNTER — Ambulatory Visit (INDEPENDENT_AMBULATORY_CARE_PROVIDER_SITE_OTHER): Admitting: Family Medicine

## 2024-08-20 ENCOUNTER — Encounter: Payer: Self-pay | Admitting: Family Medicine

## 2024-08-20 VITALS — BP 112/80 | HR 62 | Temp 97.8°F | Resp 18 | Ht 64.0 in | Wt 151.8 lb

## 2024-08-20 DIAGNOSIS — I10 Essential (primary) hypertension: Secondary | ICD-10-CM

## 2024-08-20 DIAGNOSIS — Z23 Encounter for immunization: Secondary | ICD-10-CM | POA: Diagnosis not present

## 2024-08-20 DIAGNOSIS — N1831 Chronic kidney disease, stage 3a: Secondary | ICD-10-CM | POA: Diagnosis not present

## 2024-08-20 DIAGNOSIS — R519 Headache, unspecified: Secondary | ICD-10-CM | POA: Diagnosis not present

## 2024-08-20 DIAGNOSIS — I2119 ST elevation (STEMI) myocardial infarction involving other coronary artery of inferior wall: Secondary | ICD-10-CM

## 2024-08-20 DIAGNOSIS — F5101 Primary insomnia: Secondary | ICD-10-CM | POA: Diagnosis not present

## 2024-08-20 DIAGNOSIS — E785 Hyperlipidemia, unspecified: Secondary | ICD-10-CM

## 2024-08-20 LAB — COMPREHENSIVE METABOLIC PANEL WITH GFR
ALT: 8 U/L (ref 0–35)
AST: 16 U/L (ref 0–37)
Albumin: 4.4 g/dL (ref 3.5–5.2)
Alkaline Phosphatase: 80 U/L (ref 39–117)
BUN: 15 mg/dL (ref 6–23)
CO2: 30 meq/L (ref 19–32)
Calcium: 10.1 mg/dL (ref 8.4–10.5)
Chloride: 99 meq/L (ref 96–112)
Creatinine, Ser: 0.91 mg/dL (ref 0.40–1.20)
GFR: 61.53 mL/min (ref >60.00)
Glucose, Bld: 98 mg/dL (ref 70–99)
Potassium: 4.9 meq/L (ref 3.5–5.1)
Sodium: 139 meq/L (ref 135–145)
Total Bilirubin: 1 mg/dL (ref 0.2–1.2)
Total Protein: 6.8 g/dL (ref 6.0–8.3)

## 2024-08-20 MED ORDER — ESZOPICLONE 3 MG PO TABS: 3.0000 mg | ORAL_TABLET | Freq: Every day | ORAL | 2 refills | Status: AC

## 2024-08-20 MED ORDER — AIMOVIG 140 MG/ML ~~LOC~~ SOAJ
1.0000 mL | SUBCUTANEOUS | 5 refills | Status: AC
Start: 2024-08-20 — End: ?

## 2024-08-20 MED ORDER — UBRELVY 100 MG PO TABS: ORAL_TABLET | ORAL | 1 refills | Status: AC

## 2024-08-20 NOTE — Assessment & Plan Note (Signed)
 Recheck labs today.

## 2024-08-20 NOTE — Assessment & Plan Note (Signed)
 Well controlled, no changes to meds. Encouraged heart healthy diet such as the DASH diet and exercise as tolerated.

## 2024-08-20 NOTE — Assessment & Plan Note (Signed)
 Check cmp and f/u cardiology

## 2024-08-20 NOTE — Assessment & Plan Note (Signed)
 Encourage heart healthy diet such as MIND or DASH diet, increase exercise, avoid trans fats, simple carbohydrates and processed foods, consider a krill or fish or flaxseed oil cap daily.

## 2024-08-20 NOTE — Progress Notes (Signed)
 Subjective:    Patient ID: Laura Hopkins, female    DOB: 1946-09-30, 78 y.o.   MRN: [id number]  Chief Complaint  Patient presents with   Hospitalization Follow-up    STEMI    HPI Patient is in today for hosp f/u.  Discussed the use of AI scribe software for clinical note transcription with the patient, who gave verbal consent to proceed.  History of Present Illness Laura Hopkins is a 78 year old female with coronary artery disease who presents for follow-up after a recent heart attack.  She experienced severe chest pain in the center of her chest, radiating to her jaw, which prompted her to seek medical attention. At the hospital, she underwent a cardiac catheterization that revealed a 100% stenosis in the distal circumflex artery and a side branch. She recalls that after the balloon angioplasty, she was told the blockage was reduced to 30% on the circumflex artery and 50% on the side branch. She was treated with heparin and discharged with instructions to follow up with her cardiologist.  She reports ongoing mild chest pain and slight pressure, particularly noticeable when lying down. She has been prescribed nitroglycerin but has not yet taken it. She also experiences fatigue and some shortness of breath. Her blood pressure was elevated during her hospital stay, and she was started on losartan. She is unsure of all her discharge medications but is following the instructions provided.  During her hospital stay, she experienced severe headaches and nausea, which were attributed to nitroglycerin use. A CT scan was performed to rule out other causes, and it returned clear.  She was informed of stage 2 kidney disease during her hospital stay, which progressed to stage 3 by discharge. This was attributed to medication or dehydration. She is scheduled for a recheck of her kidney function.  She has been experiencing constipation since her hospital stay, which she attributes to her medications.  She has been using milk of magnesia and Colace to manage this, with some success.  She has been prescribed Lunesta  for sleep difficulties, which she has been taking regularly. She reports difficulty sleeping without it.  She has not yet received her flu or COVID vaccinations.    Past Medical History:  Diagnosis Date   Acute pharyngitis 02/26/2009   Qualifier: Diagnosis of  By: Antonio ROSALEA Rockers     Arthritis    hands and neck    Asthma    Essential hypertension 02/07/2007   Qualifier: Diagnosis of  By: Antonio ROSALEA Rockers Britt)    HTN (hypertension)    Insomnia 12/23/2017   Lower extremity edema 07/31/2017   Migraine 05/23/2017   Pneumonia    SINUSITIS - ACUTE-NOS 03/17/2009   Qualifier: Diagnosis of  By: Antonio ROSALEA Rockers     Skin cancer    basal cell   SKIN CANCER, HX OF 01/08/2008   Annotation: BSC and SCC Qualifier: Diagnosis of  By: Antonio ROSALEA Rockers   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 Amy Jordan    URI 02/07/2007   Qualifier: Diagnosis of  By: Antonio ROSALEA Rockers      Past Surgical History:  Procedure Laterality Date   ABDOMINAL HYSTERECTOMY  1995   AUGMENTATION MAMMAPLASTY N/A    Removed 05/25/2021   BREAST ENHANCEMENT SURGERY Bilateral    BREAST IMPLANT REMOVAL Bilateral    with breast lift   CATARACT EXTRACTION W/ INTRAOCULAR LENS IMPLANT Bilateral 03/03/2017   cataract sx  with lens implant   COLONOSCOPY  2007   colon--negative   CORONARY/GRAFT ACUTE MI REVASCULARIZATION N/A 08/13/2024   Procedure: Coronary/Graft Acute MI Revascularization;  Surgeon: Anner Alm ORN, MD;  Location: [facility] INVASIVE CV LAB;  Service: Cardiovascular;  Laterality: N/A;   LEFT HEART CATH AND CORONARY ANGIOGRAPHY N/A 08/13/2024   Procedure: LEFT HEART CATH AND CORONARY ANGIOGRAPHY;  Surgeon: Anner Alm ORN, MD;  Location: [facility] INVASIVE CV LAB;  Service: Cardiovascular;  Laterality: N/A;   NASAL SEPTUM SURGERY  1983   REDUCTION MAMMAPLASTY     breast  lift, 05/25/2021   ROTATOR CUFF REPAIR Right 2000   TUBAL LIGATION  1978   WISDOM TOOTH EXTRACTION      Family History  Problem Relation Age of Onset   Hypertension Mother    Dementia Mother    Diabetes Father    Hypertension Father    Arthritis Father    Prostate cancer Father    Melanoma Brother    Colon cancer Neg Hx    Colon polyps Neg Hx    Esophageal cancer Neg Hx    Rectal cancer Neg Hx    Stomach cancer Neg Hx     Social History   Socioeconomic History   Marital status: Married    Spouse name: Not on file   Number of children: 2   Years of education: Not on file   Highest education level: 12th grade  Occupational History   Occupation: Engineer, Manufacturing Systems: UNEMPLOYED    Comment: retired  Tobacco Use   Smoking status: Former    Current packs/day: 0.00    Average packs/day: 0.3 packs/day for 35.4 years (10.6 ttl pk-yrs)    Types: Cigarettes    Start date: 10/04/1963    Quit date: 03/07/1999    Years since quitting: 25.4   Smokeless tobacco: Never  Vaping Use   Vaping status: Never Used  Substance and Sexual Activity   Alcohol use: No    Alcohol/week: 0.0 standard drinks of alcohol   Drug use: No   Sexual activity: Never    Partners: Male  Other Topics Concern   Not on file  Social History Narrative   Completed high school and took some classes at [employer].   Social Drivers of Corporate Investment Banker Strain: Low Risk  (03/21/2024)   Overall Financial Resource Strain (CARDIA)    Difficulty of Paying Living Expenses: Not hard at all  Food Insecurity: No Food Insecurity (08/19/2024)   Hunger Vital Sign    Worried About Running Out of Food in the Last Year: Never true    Ran Out of Food in the Last Year: Never true  Transportation Needs: No Transportation Needs (08/19/2024)   PRAPARE - Administrator, Civil Service (Medical): No    Lack of Transportation (Non-Medical): No  Physical Activity: Sufficiently Active (03/21/2024)    Exercise Vital Sign    Days of Exercise per Week: 5 days    Minutes of Exercise per Session: 30 min  Stress: Stress Concern Present (03/21/2024)   [employer] of Occupational Health - Occupational Stress Questionnaire    Feeling of Stress: To some extent  Social Connections: Socially Integrated (08/15/2024)   Social Connection and Isolation Panel    Frequency of Communication with Friends and Family: More than three times a week    Frequency of Social Gatherings with Friends and Family: Once a week    Attends Religious Services: More than 4 times  per year    Active Member of Clubs or Organizations: Yes    Attends Banker Meetings: 1 to 4 times per year    Marital Status: Married  Catering Manager Violence: Not At Risk (08/19/2024)   Humiliation, Afraid, Rape, and Kick questionnaire    Fear of Current or Ex-Partner: No    Emotionally Abused: No    Physically Abused: No    Sexually Abused: No    Outpatient Medications Prior to Visit  Medication Sig Dispense Refill   acetaminophen  (TYLENOL ) 500 MG tablet Take 500 mg by mouth every 6 (six) hours as needed for mild pain (pain score 1-3).     aspirin EC 81 MG tablet Take 1 tablet (81 mg total) by mouth daily. Swallow whole. 30 tablet 11   atorvastatin (LIPITOR) 80 MG tablet Take 1 tablet (80 mg total) by mouth daily. 30 tablet 5   bisoprolol (ZEBETA) 5 MG tablet Take 0.5 tablets (2.5 mg total) by mouth daily. 15 tablet 5   calcium carbonate (TUMS - DOSED IN MG ELEMENTAL CALCIUM) 500 MG chewable tablet Chew 1 tablet by mouth daily as needed for indigestion or heartburn.     losartan (COZAAR) 50 MG tablet Take 1 tablet (50 mg total) by mouth daily. 30 tablet 5   nitroGLYCERIN (NITROSTAT) 0.4 MG SL tablet Place 1 tablet (0.4 mg total) under the tongue every 5 (five) minutes as needed for chest pain. Up to 3 doses 25 tablet 3   ondansetron  (ZOFRAN ) 4 MG tablet Take 1 tablet (4 mg total) by mouth every 8 (eight) hours as  needed for nausea or vomiting. 20 tablet 0   promethazine  (PHENERGAN ) 25 MG tablet Take 1 tablet (25 mg total) by mouth as needed. 30 tablet 1   SYMBICORT  80-4.5 MCG/ACT inhaler INHALE 1 PUFF INTO THE LUNGS IN THE MORNING AND AT BEDTIME. 10.2 each 12   ticagrelor (BRILINTA) 90 MG TABS tablet Take 1 tablet (90 mg total) by mouth 2 (two) times daily. 60 tablet 11   Erenumab -aooe (AIMOVIG ) 70 MG/ML SOAJ Inject 70 mg into the skin every 30 (thirty) days. 1 mL 3   Eszopiclone  3 MG TABS Take 1 tablet (3 mg total) by mouth daily. (Patient taking differently: Take 3 mg by mouth daily as needed (for sleep).) 30 tablet 2   estradiol  (VIVELLE -DOT) 0.05 MG/24HR patch Place 1 patch (0.05 mg total) onto the skin 2 (two) times a week. (Patient not taking: Reported on 08/20/2024) 8 patch 11   progesterone (PROMETRIUM) 100 MG capsule Take 100 mg by mouth at bedtime. (Patient not taking: Reported on 08/20/2024)     No facility-administered medications prior to visit.    Allergies  Allergen Reactions   Avapro  [Irbesartan ] Cough   Codeine Other (See Comments)    Makes pt hyper    Metoprolol  Cough    Metallic taste , nasal drainage    Norvasc  [Amlodipine  Besylate] Swelling    Review of Systems  Constitutional:  Negative for chills, fever and malaise/fatigue.  HENT:  Negative for congestion and hearing loss.   Eyes:  Negative for discharge.  Respiratory:  Negative for cough, sputum production and shortness of breath.   Cardiovascular:  Negative for chest pain, palpitations and leg swelling.  Gastrointestinal:  Negative for abdominal pain, blood in stool, constipation, diarrhea, heartburn, nausea and vomiting.  Genitourinary:  Negative for dysuria, frequency, hematuria and urgency.  Musculoskeletal:  Negative for back pain, falls and myalgias.  Skin:  Negative for rash.  Neurological:  Negative for dizziness, sensory change, loss of consciousness, weakness and headaches.  Endo/Heme/Allergies:  Negative for  environmental allergies. Does not bruise/bleed easily.  Psychiatric/Behavioral:  Negative for depression and suicidal ideas. The patient is not nervous/anxious and does not have insomnia.        Objective:    Physical Exam Vitals and nursing note reviewed.  Constitutional:      General: She is not in acute distress.    Appearance: Normal appearance. She is well-developed.  HENT:     Head: Normocephalic and atraumatic.  Eyes:     General: No scleral icterus.       Right eye: No discharge.        Left eye: No discharge.  Cardiovascular:     Rate and Rhythm: Normal rate and regular rhythm.     Heart sounds: No murmur heard. Pulmonary:     Effort: Pulmonary effort is normal. No respiratory distress.     Breath sounds: Normal breath sounds.  Musculoskeletal:        General: Normal range of motion.     Cervical back: Normal range of motion and neck supple.     Right lower leg: No edema.     Left lower leg: No edema.  Skin:    General: Skin is warm and dry.  Neurological:     Mental Status: She is alert and oriented to person, place, and time.  Psychiatric:        Mood and Affect: Mood normal.        Behavior: Behavior normal.        Thought Content: Thought content normal.        Judgment: Judgment normal.     BP 112/80 (BP Location: Left Arm, Patient Position: Sitting, Cuff Size: Normal)   Pulse 62   Temp 97.8 F (36.6 C) (Oral)   Resp 18   Ht 5' 4 (1.626 m)   Wt 151 lb 12.8 oz (68.9 kg)   SpO2 98%   BMI 26.06 kg/m  Wt Readings from Last 3 Encounters:  08/20/24 151 lb 12.8 oz (68.9 kg)  08/15/24 149 lb 11.2 oz (67.9 kg)  07/05/24 154 lb 8 oz (70.1 kg)    Diabetic Foot Exam - Simple   No data filed    Lab Results  Component Value Date   WBC 9.4 08/16/2024   HGB 13.1 08/16/2024   HCT 40.2 08/16/2024   PLT 232 08/16/2024   GLUCOSE 96 08/16/2024   CHOL 182 08/13/2024   TRIG 158 (H) 08/13/2024   HDL 67 08/13/2024   LDLCALC 83 08/13/2024   ALT <5  08/13/2024   AST 22 08/13/2024   NA 134 (L) 08/16/2024   K 3.7 08/16/2024   CL 104 08/16/2024   CREATININE 1.05 (H) 08/16/2024   BUN 14 08/16/2024   CO2 26 08/16/2024   TSH 1.25 05/07/2024   INR 0.9 08/13/2024   HGBA1C 5.2 08/13/2024    Lab Results  Component Value Date   TSH 1.25 05/07/2024   Lab Results  Component Value Date   WBC 9.4 08/16/2024   HGB 13.1 08/16/2024   HCT 40.2 08/16/2024   MCV 94.6 08/16/2024   PLT 232 08/16/2024   Lab Results  Component Value Date   NA 134 (L) 08/16/2024   K 3.7 08/16/2024   CO2 26 08/16/2024   GLUCOSE 96 08/16/2024   BUN 14 08/16/2024   CREATININE 1.05 (H) 08/16/2024   BILITOT 0.4 08/13/2024   ALKPHOS  92 08/13/2024   AST 22 08/13/2024   ALT <5 08/13/2024   PROT 7.3 08/13/2024   ALBUMIN 4.6 08/13/2024   CALCIUM 8.7 (L) 08/16/2024   ANIONGAP 4 (L) 08/16/2024   EGFR 59 (L) 03/22/2024   GFR 55.72 (L) 05/15/2024   Lab Results  Component Value Date   CHOL 182 08/13/2024   Lab Results  Component Value Date   HDL 67 08/13/2024   Lab Results  Component Value Date   LDLCALC 83 08/13/2024   Lab Results  Component Value Date   TRIG 158 (H) 08/13/2024   Lab Results  Component Value Date   CHOLHDL 2.7 08/13/2024   Lab Results  Component Value Date   HGBA1C 5.2 08/13/2024       Assessment & Plan:  ST elevation myocardial infarction (STEMI) of inferolateral wall (HCC) Assessment & Plan: Check cmp and f/u cardiology   Stage 3a chronic kidney disease (HCC) Assessment & Plan: Recheck labs today  Orders: -     Comprehensive metabolic panel with GFR  Need for influenza vaccination -     Flu vaccine HIGH DOSE PF(Fluzone Trivalent)  Acute intractable headache, unspecified headache type -     Aimovig ; Inject 140 mg into the skin every 30 (thirty) days.  Dispense: 1 mL; Refill: 5 -     Ubrelvy; 1 po x1  may repeat in 2 h as needed  Dispense: 30 tablet; Refill: 1  Primary insomnia -     Eszopiclone ; Take 1 tablet  (3 mg total) by mouth daily.  Dispense: 30 tablet; Refill: 2  Hyperlipidemia, unspecified hyperlipidemia type Assessment & Plan: Encourage heart healthy diet such as MIND or DASH diet, increase exercise, avoid trans fats, simple carbohydrates and processed foods, consider a krill or fish or flaxseed oil cap daily.     Essential hypertension Assessment & Plan: Well controlled, no changes to meds. Encouraged heart healthy diet such as the DASH diet and exercise as tolerated.     Assessment and Plan Assessment & Plan STEMI of inferior wall with coronary artery disease   Recent STEMI with 100% stenosis in the distal circumflex artery and side branch was treated with balloon angioplasty, reducing stenosis to 30% and side branch to 50%. She is on heparin and losartan, reporting residual chest pain and fatigue. No aortic stenosis is present, and ejection fraction is 55-60%. Continue losartan for blood pressure management. Use nitroglycerin for chest pain as needed, with instructions to take every 5 minutes up to three times and call 911 if pain persists. Follow up with cardiologist on November 25th. Participate in cardiac rehabilitation program.  Chronic kidney disease, stage 3a   Stage 3a chronic kidney disease likely exacerbated by recent hospitalization and medication use. Previous tests showed stage 2, progressing to stage 3a by discharge. Rechecked kidney function with lab tests today.  Primary insomnia   Insomnia likely exacerbated by recent cardiac event and hospitalization. Previously prescribed Lunesta  has been effective. Discussed commonality of sleep disturbances post-cardiac event. Continue Lunesta  as prescribed, with refills available. Monitor sleep patterns and adjust medication as needed.  Constipation   Likely related to recent hospitalization and medication use. Previous use of Miralax was ineffective, but recent use of milk of magnesia and Colace was effective. Use Colace as  needed, up to twice daily, to prevent constipation.  Headache   Severe headaches experienced during hospitalization, likely related to nitroglycerin use. CT scan was clear. Discussed potential for nitroglycerin to cause headaches. Use nitroglycerin cautiously, considering potential  for headaches.  General Health Maintenance   Discussed flu vaccination and COVID-19 vaccination. She is hesitant about COVID-19 vaccination due to limited social interaction. Administered flu shot today. Discussed COVID-19 vaccination options and encouraged consideration.    Brookelynn Hamor R Lowne Chase, DO

## 2024-08-21 ENCOUNTER — Other Ambulatory Visit (HOSPITAL_COMMUNITY): Payer: Self-pay

## 2024-08-21 ENCOUNTER — Telehealth: Payer: Self-pay

## 2024-08-21 ENCOUNTER — Telehealth (HOSPITAL_COMMUNITY): Payer: Self-pay

## 2024-08-21 NOTE — Telephone Encounter (Signed)
 Pharmacy Patient Advocate Encounter   Received notification from Pt Calls Messages that prior authorization for Ubrelvy  100mg  tabs is required/requested.   Insurance verification completed.   The patient is insured through [facility].   Per test claim: PA required; PA submitted to above mentioned insurance via Latent Key/confirmation #/EOC [id number] Status is pending

## 2024-08-21 NOTE — Telephone Encounter (Signed)
 Copied from CRM [id number]. Topic: Clinical - Medication Prior Auth >> Aug 21, 2024 12:52 PM Aleatha C wrote: Reason for CRM: Hulan is calling because the Ubrogepant  (UBRELVY ) 100 MG TABS needs a prior authorization call back [phone] , [phone] for Prior Auth

## 2024-08-21 NOTE — Telephone Encounter (Signed)
 Attempted to call patient in regards to Cardiac Rehab - LM on VM

## 2024-08-21 NOTE — Telephone Encounter (Signed)
 Pharmacy Patient Advocate Encounter  Received notification from [facility] that Prior Authorization for Ubrelvy 100mg  tabs has been APPROVED from 08/21/24 to 10/02/24   PA #/Case ID/Reference #: [id number]

## 2024-08-22 ENCOUNTER — Ambulatory Visit: Payer: Self-pay | Admitting: Family Medicine

## 2024-08-22 MED FILL — Fentanyl Citrate Preservative Free (PF) Inj 100 MCG/2ML: INTRAMUSCULAR | Qty: 2 | Status: AC

## 2024-08-23 NOTE — Telephone Encounter (Signed)
 Pt made aware.

## 2024-08-23 NOTE — Telephone Encounter (Unsigned)
 Copied from CRM [id number]. Topic: Clinical - Medication Prior Auth >> Aug 23, 2024  9:05 AM Avram MATSU wrote: Reason for CRM: patient called to follow up on her PA and would like for the medication to be sent out!!

## 2024-08-26 ENCOUNTER — Telehealth (HOSPITAL_COMMUNITY): Payer: Self-pay

## 2024-08-26 ENCOUNTER — Encounter: Payer: Self-pay | Admitting: *Deleted

## 2024-08-26 NOTE — Telephone Encounter (Signed)
 Pt insurance is active and benefits verified through [facility]. Co-pay TCR/40.00, ICR / 55.00   DED 0/0  out of pocket 4150.00/470.08 met, co-insurance/ 0. No pre-authorization required. [address] Hulan Many, REF# [id number]  TCR/ICR? ICR Visit(date of service)limitation? No limit Can multiple codes be used on the same date of service/visit?(IF ITS A LIMIT) NA  Is this a lifetime maximum or an annual maximum? Annual Has the member used any of these services to date? No Is there a time limit (weeks/months) on start of program and/or program completion? No

## 2024-08-26 NOTE — Telephone Encounter (Signed)
 Patient returned phone called and stated that she was interested in Cardiac Rehab.  Expressed interest in 8:15 class.  Advised that we would be contacting her after F/U visit to schedule and go over insurance,

## 2024-08-27 ENCOUNTER — Ambulatory Visit: Attending: Emergency Medicine | Admitting: Emergency Medicine

## 2024-08-27 ENCOUNTER — Encounter: Payer: Self-pay | Admitting: Emergency Medicine

## 2024-08-27 ENCOUNTER — Telehealth: Payer: Self-pay

## 2024-08-27 ENCOUNTER — Other Ambulatory Visit (HOSPITAL_COMMUNITY): Payer: Self-pay

## 2024-08-27 ENCOUNTER — Telehealth: Payer: Self-pay | Admitting: Family Medicine

## 2024-08-27 VITALS — BP 116/76 | HR 62 | Ht 64.0 in | Wt 151.0 lb

## 2024-08-27 DIAGNOSIS — E785 Hyperlipidemia, unspecified: Secondary | ICD-10-CM | POA: Diagnosis not present

## 2024-08-27 DIAGNOSIS — I471 Supraventricular tachycardia, unspecified: Secondary | ICD-10-CM | POA: Diagnosis not present

## 2024-08-27 DIAGNOSIS — Z79899 Other long term (current) drug therapy: Secondary | ICD-10-CM | POA: Diagnosis not present

## 2024-08-27 DIAGNOSIS — I2510 Atherosclerotic heart disease of native coronary artery without angina pectoris: Secondary | ICD-10-CM

## 2024-08-27 DIAGNOSIS — I10 Essential (primary) hypertension: Secondary | ICD-10-CM

## 2024-08-27 DIAGNOSIS — I2119 ST elevation (STEMI) myocardial infarction involving other coronary artery of inferior wall: Secondary | ICD-10-CM | POA: Diagnosis not present

## 2024-08-27 NOTE — Telephone Encounter (Signed)
 LM on [facility] informing of PA approval for eszopiclone  3mg .

## 2024-08-27 NOTE — Patient Instructions (Addendum)
 Medication Instructions:  NO CHANGES  Lab Work: CMET AND FASTING LIPID PANEL TO BE DONE IN 6 WEEKS.  Testing/Procedures: NONE  Follow-Up: At [facility], you and your health needs are our priority.  As part of our continuing mission to provide you with exceptional heart care, our providers are all part of one team.  This team includes your primary Cardiologist (physician) and Advanced Practice Providers or APPs (Physician Assistants and Nurse Practitioners) who all work together to provide you with the care you need, when you need it.  Your next appointment:   3 MONTHS  Provider:   Alm Clay, MD

## 2024-08-27 NOTE — Telephone Encounter (Signed)
Needs PA please.

## 2024-08-27 NOTE — Telephone Encounter (Signed)
 Copied from CRM #[id number]. Topic: Clinical - Prescription Issue >> Aug 27, 2024 10:44 AM Drema MATSU wrote: Reason for CRM: Patient stated that she was told by the pharmacy that her provider did not respond to their request to get an alternative prescription for Eszopiclone  3 MG TABS. She was told to contact office.

## 2024-08-27 NOTE — Telephone Encounter (Signed)
 Pharmacy Patient Advocate Encounter   Received notification from Pt Calls Messages that prior authorization for Eszopiclone  3mg  tabs is required/requested.   Insurance verification completed.   The patient is insured through [facility].   Per test claim: PA required; PA submitted to above mentioned insurance via Latent Key/confirmation #/EOC [id number] Status is pending

## 2024-08-27 NOTE — Telephone Encounter (Signed)
 Pharmacy Patient Advocate Encounter  Received notification from [facility] that Prior Authorization for Eszopiclone  3mg  tabs has been APPROVED from 08/27/24 to 10/02/24   PA #/Case ID/Reference #: [id number]

## 2024-08-27 NOTE — Progress Notes (Signed)
 Cardiology Office Note:    Date:  08/27/2024  ID:  Laura Hopkins, DOB 05-03-1946, MRN [id number] PCP: Antonio Meth, Jamee SAUNDERS, DO   [facility] Providers Cardiologist:  Alm Clay, MD Cardiology APP:  Rana Lum CROME, NP       Patient Profile:       Chief Complaint: Follow-up hospitalization for STEMI History of Present Illness:  Laura Hopkins is a 78 y.o. female with visit-pertinent history of remote smoking history, hypertension, asthma, insomnia, migraines, prior skin cancer, coronary artery disease, STEMI, PSVT  She was admitted on 08/13/2024 for acute onset of chest pain with NSTEMI.  She had no prior cardiac history.  She reported to be dealing with atypical abdominal pains for several months without clear etiology.  Abdominal ultrasound and CT abdomen/pelvis were unremarkable except for diverticulosis.  On the day of admission while at rest she developed acute onset severe substernal chest pain radiating to her jaw.  She took a full aspirin  325 mg at home and came to the ER where EKG showed acute ST elevation inferiorly as well as V5-V6.  Code STEMI was called.  General cardiac catheterization showing occlusion of distal LCx.  She had PTCA at the site of occlusion but further distally the vessel remained occluded.  High residual chest pain post cath with improved ST changes by EKG, treated with cangrelor  and heparin  post cath.  Her LVEDP was severely elevated by cath but not felt to require diuresis.  Echocardiogram showed LVEF 55 to 60% with mid inferolateral hypokinesis, normal RV size and systolic function.  She did have a run of PSVT with no reoccurrence once beta-blocker was added.  Current medications include aspirin , Brilinta , atorvastatin , bisoprolol , losartan .   Discussed the use of AI scribe software for clinical note transcription with the patient, who gave verbal consent to proceed.  History of Present Illness JANARI Hopkins is a 78 year old female with  coronary artery disease who presents for follow-up post STEMI.  She reports she is doing well without any new acute cardiovascular concerns.  She does report ongoing fatigue and decreased appetite which is unchanged posthospitalization.  Before hospitalization she had frequent indigestion and a persistent cough that she thought were stomach related. Since angioplasty, she has had only two brief episodes of indigestion and the cough has resolved.  She denies any further episodes of chest pains or jaw pain.  She is taking aspirin , Brilinta  twice daily, atorvastatin , bisoprolol , and losartan . She takes Lunesta  for insomnia. She stopped her hormone patch after hospitalization.  She has resumed walking about 20 minutes at a time without chest pain or shortness of breath.  She denies dyspnea, orthopnea, PND, LEE, syncope, presyncope, lightheadedness, dizziness, mental, hematochezia   Review of systems:  Please see the history of present illness. All other systems are reviewed and otherwise negative.      Studies Reviewed:    EKG Interpretation Date/Time:  Tuesday August 27 2024 14:24:01 EST Ventricular Rate:  62 PR Interval:  172 QRS Duration:  80 QT Interval:  376 QTC Calculation: 381 R Axis:   51  Text Interpretation: Normal sinus rhythm Low voltage QRS Cannot rule out Anterior infarct , age undetermined When compared with ECG of 16-Aug-2024 09:02, Nonspecific T wave abnormality now evident in Inferior leads Confirmed by Rana Lum [phone]) on 08/27/2024 8:59:21 PM   Echocardiogram 08/14/2024  1. Mild inferolateral hypokinesis. Left ventricular ejection fraction, by  estimation, is 55 to 60%. The left ventricle has normal function.  The left  ventricle demonstrates regional wall motion abnormalities (see scoring  diagram/findings for  description). Left ventricular diastolic parameters were normal.   2. Right ventricular systolic function is normal. The right ventricular  size  is normal.   3. The mitral valve is normal in structure. No evidence of mitral valve  regurgitation. No evidence of mitral stenosis.   4. The aortic valve is tricuspid. There is mild calcification of the  aortic valve. Aortic valve regurgitation is not visualized. Aortic valve  sclerosis is present, with no evidence of aortic valve stenosis.   5. The inferior vena cava is normal in size with greater than 50%  respiratory variability, suggesting right atrial pressure of 3 mmHg.   Cardiac catheterization 08/13/2024   Dist Cx lesion is 100% stenosed with 100% stenosed side branch in 4th Mrg.  TIMI 0 flow last Peroneal   Balloon angioplasty was performed using a BALLOON EMERGE MR 2.0X12.   Post intervention, there is a 30% residual stenosis. Post intervention, the side branch was reduced to 50% residual stenosis.  TIMI I-II flow up to the next occlusion site restored.   4th Mrg (extension of distal LCx) lesion is 100% stenosed.-Likely distal normalization from the original infarction rating   At this point, decision making with Dr. Verlin was to accept borderline successful PTCA in order to avoid potential dangerous perforation.  Very distal to the wire this vessel distally.  Allow time for IV medications to work.   ---------------------------------------   Otherwise normal LAD with small diagonals, LCx with 3 other OM's and RCA with PDA and 3 PL's.   LV end diastolic pressure is severely elevated.  There is no aortic valve stenosis. Acute Inferolateral STEMI involving the distal LCx and a very tortuous vessel: 100% occlusion after major bend in the takeoff of a small sidebranch. =>  Minimally successful PTCA restoring flow to another 40 mm of the vessel but still no reflow distally despite IC NTG and angioplasty with small pressures.  Due to the tortuosity, I opted to forego further treatment and left balloon and plasty result only.  LV gram not performed-concern due to LVEDP estimated 25 mmHg.        Plan: Admit to ICU -continue cangrelor  until current bag complete.  Would load with Brilinta  180 mg prior to completion of cangrelor . Initiate heparin  8 hours after sheath removal = plan would be to continue 48 hours of IV heparin  IV analgesics ordered for pain control given the ongoing chest pain. Will check 2D Echo. Multiple different medication intolerances, will start low-dose irbesartan  and consider bisoprolol . Diagnostic Dominance: Right  Intervention   Risk Assessment/Calculations:              Physical Exam:   VS:  BP 116/76 (BP Location: Left Arm, Patient Position: Sitting, Cuff Size: Normal)   Pulse 62   Ht 5' 4 (1.626 m)   Wt 151 lb (68.5 kg)   BMI 25.92 kg/m    Wt Readings from Last 3 Encounters:  08/27/24 151 lb (68.5 kg)  08/20/24 151 lb 12.8 oz (68.9 kg)  08/15/24 149 lb 11.2 oz (67.9 kg)    GEN: Well nourished, well developed in no acute distress NECK: No JVD; No carotid bruits CARDIAC: RRR, no murmurs, rubs, gallops RESPIRATORY:  Clear to auscultation without rales, wheezing or rhonchi  ABDOMEN: Soft, non-tender, non-distended EXTREMITIES:  No edema; No acute deformity      Assessment and Plan:  Coronary artery disease Inferolateral STEMI S/p STEMI  from occlusion of distal LCx.  Had PTCA to site of occlusion but further distally the vessel remained occluded. Had residual chest pain post cath with improved ST changes by EKG, treated with cangrelor  and heparin  post-cath course  Echo 08/2024 with LVEF 55-60% with mid inferolateral and mid anterolateral hypokinesis, normal RV size and systolic function  - Today she is stable without chest pains.  Has resumed activity without exertional symptoms.  She denies her prior anginal equivalent.  No indication for further ischemic evaluation at this time - Continue aspirin  81 mg daily and Brilinta  90 mg twice daily - Continue atorvastatin  80 mg daily, losartan  50 mg daily, and bisoprolol  2.5 mg daily - No  longer taking HRT  - Okay to start cardiac rehab  Hypertension Blood pressure today is well-controlled 116/76 - No changes.  Continue current medication regimen  Hyperlipidemia, LDL goal <70 LDL 83 on 08/2024 Subsequently started on atorvastatin  on 08/2024 - Plan for repeat fasting lipid panel and LFTs x 6 weeks - Continue atorvastatin  80 mg daily  PSVT She had a run of SVT during recent admission - She denies any further episodes of tachycardia     Cardiac Rehabilitation Eligibility Assessment  The patient is ready to start cardiac rehabilitation from a cardiac standpoint.     Dispo:  Return in about 3 months (around 11/27/2024).  Signed, Lum LITTIE Louis, NP

## 2024-09-02 ENCOUNTER — Telehealth (HOSPITAL_COMMUNITY): Payer: Self-pay

## 2024-09-02 NOTE — Telephone Encounter (Signed)
 Attempted to call regarding cardiac rehab- no answer, left message. Sent MyChart message.

## 2024-09-04 ENCOUNTER — Other Ambulatory Visit (HOSPITAL_COMMUNITY): Payer: Self-pay

## 2024-09-05 ENCOUNTER — Other Ambulatory Visit (HOSPITAL_COMMUNITY): Payer: Self-pay

## 2024-09-12 ENCOUNTER — Other Ambulatory Visit (HOSPITAL_COMMUNITY): Payer: Self-pay

## 2024-09-16 ENCOUNTER — Telehealth (HOSPITAL_COMMUNITY): Payer: Self-pay

## 2024-09-16 ENCOUNTER — Telehealth: Payer: Self-pay | Admitting: Physician Assistant

## 2024-09-16 NOTE — Telephone Encounter (Signed)
 Left message to call back.

## 2024-09-16 NOTE — Telephone Encounter (Signed)
 F/u call regarding cardiac rehab scheduling- no answer, left message.  Closing referral.

## 2024-09-16 NOTE — Telephone Encounter (Addendum)
 Patient recently seen in the hospital and referred to neurology for mgmt of migraines due to need for discontinuation of triptans. Please let pt know we received a msg from neurology that she needs to see PCP first for migraines before re-establishing with them.  Cordero, Danna W  Ryheem Jay N, PA-C From: Evonnie Asberry RAMAN, DO Sent: 09/16/2024   8:55 AM EST To: Lanning LELON Crisp  Pt will need seen and evaluated by pcp for migraine as per value based care guidelines.  [facility] has not seen her for this (and hasn't seen her in years).  No notes on migraine and unclear what if any treatment tried.

## 2024-09-17 NOTE — Telephone Encounter (Signed)
Left message to check MyChart message

## 2024-10-08 ENCOUNTER — Other Ambulatory Visit

## 2024-10-08 ENCOUNTER — Telehealth: Payer: Self-pay | Admitting: Family Medicine

## 2024-10-08 LAB — COMPREHENSIVE METABOLIC PANEL WITH GFR
ALT: 20 IU/L (ref 0–32)
AST: 24 IU/L (ref 0–40)
Albumin: 4.5 g/dL (ref 3.8–4.8)
Alkaline Phosphatase: 116 IU/L (ref 49–135)
BUN/Creatinine Ratio: 13 (ref 12–28)
BUN: 15 mg/dL (ref 8–27)
Bilirubin Total: 0.7 mg/dL (ref 0.0–1.2)
CO2: 23 mmol/L (ref 20–29)
Calcium: 9.9 mg/dL (ref 8.7–10.3)
Chloride: 102 mmol/L (ref 96–106)
Creatinine, Ser: 1.12 mg/dL — ABNORMAL HIGH (ref 0.57–1.00)
Globulin, Total: 2.4 g/dL (ref 1.5–4.5)
Glucose: 95 mg/dL (ref 70–99)
Potassium: 4.4 mmol/L (ref 3.5–5.2)
Sodium: 143 mmol/L (ref 134–144)
Total Protein: 6.9 g/dL (ref 6.0–8.5)
eGFR: 51 mL/min/1.73 — ABNORMAL LOW

## 2024-10-08 LAB — LIPID PANEL
Chol/HDL Ratio: 1.9 ratio (ref 0.0–4.4)
Cholesterol, Total: 117 mg/dL (ref 100–199)
HDL: 63 mg/dL
LDL Chol Calc (NIH): 37 mg/dL (ref 0–99)
Triglycerides: 87 mg/dL (ref 0–149)
VLDL Cholesterol Cal: 17 mg/dL (ref 5–40)

## 2024-10-08 NOTE — Telephone Encounter (Signed)
 Patient scheduled today but I only see orders for a different office

## 2024-10-09 ENCOUNTER — Ambulatory Visit: Payer: Self-pay | Admitting: Emergency Medicine

## 2024-10-24 ENCOUNTER — Telehealth: Payer: Self-pay | Admitting: Cardiology

## 2024-10-24 NOTE — Telephone Encounter (Signed)
 Usually we do like to wait longer with PCI, but she really only had PTCA.  She was in ACS/STEMI presentation, but she really only had PTCA.  Without having a stent and she is probably okay holding DAPT but would try to wait as long as we can.  3 months is acceptable, but closer to 6 is better.  If urgent, then she is probably okay by mid February.  (3 months out)  Alm Clay, MD

## 2024-10-24 NOTE — Telephone Encounter (Signed)
" ° °  Pre-operative Risk Assessment    Patient Name: Laura Hopkins  DOB: [birth year] MRN: [id number]      Request for Surgical Clearance    Procedure:  Dental Cleaning   Date of Surgery:  Clearance TBD                                 Surgeon:  Dr. Dominique Saupe  Surgeon's Group or Practice Name:  [facility]  Phone number:  [phone] Fax number:  [phone]   Type of Clearance Requested:   - Medical    Type of Anesthesia:  None    Additional requests/questions:  Wants to know if pre medication and/or how long does patient need to wait for cleaning.   Signed, Chantal CHRISTELLA Penton   10/24/2024, 2:11 PM   "

## 2024-10-25 NOTE — Telephone Encounter (Signed)
" ° °  Patient Name: Laura Hopkins  DOB: Jan 17, 1946 MRN: [id number]  Primary Cardiologist: Alm Clay, MD  Chart reviewed as part of pre-operative protocol coverage. Pre-op clearance already addressed by colleagues in earlier phone notes. To summarize recommendations:  Usually we do like to wait longer with PCI, but she really only had PTCA. She was in ACS/STEMI presentation, but she really only had PTCA.  Without having a stent and she is probably okay holding DAPT but would try to wait as long as we can. 3 months is acceptable, but closer to 6 is better. If urgent, then she is probably okay by mid February.  (3 months out) Alm Clay, MD   -Since she does not need to hold antiplatelet, would recommend waiting 3 months or mid February.   -She does not need SBE.   Will route this bundled recommendation to requesting provider via Epic fax function and remove from pre-op pool. Please call with questions.  Mardy KATHEE Pizza, FNP 10/25/2024, 7:53 AM  "

## 2024-12-16 ENCOUNTER — Ambulatory Visit: Admitting: Cardiology
